# Patient Record
Sex: Female | Born: 1995 | Race: Black or African American | Hispanic: No | Marital: Single | Smoking: Current some day smoker
Health system: Southern US, Community
[De-identification: ages and names within clinical notes are randomized; demographics above are authoritative.]

## PROBLEM LIST (undated history)

## (undated) ENCOUNTER — Emergency Department (HOSPITAL_COMMUNITY): Admission: EM | Payer: Self-pay

## (undated) DIAGNOSIS — IMO0001 Reserved for inherently not codable concepts without codable children: Secondary | ICD-10-CM

## (undated) DIAGNOSIS — E282 Polycystic ovarian syndrome: Secondary | ICD-10-CM

## (undated) DIAGNOSIS — J45909 Unspecified asthma, uncomplicated: Secondary | ICD-10-CM

## (undated) DIAGNOSIS — H35039 Hypertensive retinopathy, unspecified eye: Secondary | ICD-10-CM

## (undated) DIAGNOSIS — E119 Type 2 diabetes mellitus without complications: Secondary | ICD-10-CM

## (undated) DIAGNOSIS — E669 Obesity, unspecified: Secondary | ICD-10-CM

## (undated) DIAGNOSIS — Z794 Long term (current) use of insulin: Secondary | ICD-10-CM

## (undated) DIAGNOSIS — F909 Attention-deficit hyperactivity disorder, unspecified type: Secondary | ICD-10-CM

## (undated) DIAGNOSIS — N9489 Other specified conditions associated with female genital organs and menstrual cycle: Secondary | ICD-10-CM

## (undated) DIAGNOSIS — Z8709 Personal history of other diseases of the respiratory system: Secondary | ICD-10-CM

## (undated) DIAGNOSIS — E11319 Type 2 diabetes mellitus with unspecified diabetic retinopathy without macular edema: Secondary | ICD-10-CM

## (undated) DIAGNOSIS — R030 Elevated blood-pressure reading, without diagnosis of hypertension: Secondary | ICD-10-CM

## (undated) DIAGNOSIS — H3500 Unspecified background retinopathy: Secondary | ICD-10-CM

## (undated) HISTORY — PX: EXTERNAL EAR SURGERY: SHX627

## (undated) HISTORY — DX: Attention-deficit hyperactivity disorder, unspecified type: F90.9

## (undated) HISTORY — PX: TYMPANOSTOMY TUBE PLACEMENT: SHX32

## (undated) HISTORY — DX: Hypertensive retinopathy, unspecified eye: H35.039

## (undated) HISTORY — DX: Elevated blood-pressure reading, without diagnosis of hypertension: R03.0

## (undated) HISTORY — DX: Obesity, unspecified: E66.9

## (undated) HISTORY — DX: Type 2 diabetes mellitus with unspecified diabetic retinopathy without macular edema: E11.319

## (undated) HISTORY — DX: Polycystic ovarian syndrome: E28.2

## (undated) HISTORY — DX: Reserved for inherently not codable concepts without codable children: IMO0001

---
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
## 2013-04-07 ENCOUNTER — Other Ambulatory Visit (HOSPITAL_COMMUNITY)
Admission: RE | Admit: 2013-04-07 | Discharge: 2013-04-07 | Disposition: A | Discharge status: Home / Self Care | Payer: Medicaid Other | Source: Ambulatory Visit | Attending: Pediatrics | Admitting: Pediatrics

## 2013-04-07 ENCOUNTER — Ambulatory Visit (INDEPENDENT_AMBULATORY_CARE_PROVIDER_SITE_OTHER): Payer: Medicaid Other | Admitting: Pediatrics

## 2013-04-07 ENCOUNTER — Encounter: Payer: Self-pay | Admitting: Pediatrics

## 2013-04-07 VITALS — BP 138/84 | Ht 64.41 in | Wt 226.8 lb

## 2013-04-07 DIAGNOSIS — Z113 Encounter for screening for infections with a predominantly sexual mode of transmission: Secondary | ICD-10-CM | POA: Insufficient documentation

## 2013-04-07 DIAGNOSIS — N926 Irregular menstruation, unspecified: Secondary | ICD-10-CM

## 2013-04-07 DIAGNOSIS — M7918 Myalgia, other site: Secondary | ICD-10-CM

## 2013-04-07 DIAGNOSIS — K2970 Gastritis, unspecified, without bleeding: Secondary | ICD-10-CM

## 2013-04-07 DIAGNOSIS — Z23 Encounter for immunization: Secondary | ICD-10-CM

## 2013-04-07 DIAGNOSIS — E119 Type 2 diabetes mellitus without complications: Secondary | ICD-10-CM

## 2013-04-07 DIAGNOSIS — E1159 Type 2 diabetes mellitus with other circulatory complications: Secondary | ICD-10-CM

## 2013-04-07 DIAGNOSIS — IMO0001 Reserved for inherently not codable concepts without codable children: Secondary | ICD-10-CM

## 2013-04-07 LAB — POCT URINALYSIS DIPSTICK
Blood, UA: NEGATIVE
Nitrite, UA: NEGATIVE
Protein, UA: NEGATIVE
Spec Grav, UA: 1.01
Urobilinogen, UA: NEGATIVE

## 2013-04-07 MED ORDER — FREESTYLE SYSTEM KIT: 1.0000 | PACK | Status: DC | PRN

## 2013-04-07 MED ORDER — MEFENAMIC ACID 250 MG PO CAPS: 2.0000 | ORAL_CAPSULE | Freq: Three times a day (TID) | ORAL | Status: AC

## 2013-04-07 MED ORDER — NAPROXEN SODIUM 220 MG PO TABS: 220.0000 mg | ORAL_TABLET | Freq: Two times a day (BID) | ORAL | Status: DC

## 2013-04-07 MED ORDER — METFORMIN HCL ER 500 MG PO TB24: 1500.0000 mg | ORAL_TABLET | Freq: Every evening | ORAL | Status: DC | PRN

## 2013-04-07 MED ORDER — OMEPRAZOLE 20 MG PO CPDR: 20.0000 mg | DELAYED_RELEASE_CAPSULE | Freq: Every day | ORAL | Status: DC

## 2013-04-07 NOTE — Progress Notes (Signed)
History was provided by the patient and mother.  Mercedes Valdez is a 17 y.o. female who is here for irregular bleeding after nexplanon & elevated blood glucose.Marland Kitchen     HPI:  Mercedes Valdez is here due to concerns of frequent irregular spotting & bleeding after Nexplanon placement on 11/09/12. She reports that initially she did well for a few weeks & then started having irregular spotting. Initially it was atleast 2-3 times a week & now it is a day of spotting or bleeding needed 1-3 pad changes every week. She is very frustrated with this bleeding pattern & is wondering if she should get the Nexplanon taken out. She reports that occasional during the bleeding, she experiences cramping pain. She is sexually active & reports to be using condoms. No dysuria.  Pt also has not been compliant with her metformin which has been a chronic issue. At her visit with Dr Marina Goodell, referral was made to Endocrine but Dilan missed the appt & reports that they did not know about the appt. Her bld glucose was elevated at her last visit to 334 mg/dl & is 161 mg/dl today. She is asymptomatic today & had just completed a meal of double cheese burger & a large sweet tea. Lynnex c/o gastitis & nausea with metformin & is very non-compliant with meds. She does not follow any dietary advise & does not seem concerned about the consequences. Mom was with her today & was reporting that Mercedes Valdez usually does not eat unhealthy but due to financial constraints they are unable to make the best choices.  Mom also has Type 2 DM & is on meds. They do not have a glucometer to check BG at home. Overall [name] reports that she feels healthy & is not had any infections or illness in the past 6 mths. 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 She has a h/o meniscal tear & has had surgical repair. She does not get adequate breaks at work to eat small frequent meals. She c/o occasional  headaches. No blurry vision, no h/o dizziness or fainting spells.   Physical Exam:  BP 138/84  Ht 5' 4.41" (1.636 m)  Wt 226 lb 12.8 oz (102.876 kg)  BMI 38.44 kg/m2  LMP 04/03/2013  99.7% systolic and 96.9% diastolic of BP percentile by age, sex, and height. Patient's last menstrual period was 04/03/2013.    General:   alert and cooperative     Skin:   normal, multiple body piercings  Oral cavity:   lips, mucosa, and tongue normal; teeth and gums normal  Eyes:   sclerae white, pupils equal and reactive  Ears:   normal bilaterally  Nose: clear, no discharge  Neck:  Neck appearance: Normal, AN present.  Lungs:  clear to auscultation bilaterally  Heart:   regular rate and rhythm, S1, S2 normal, no murmur, click, rub or gallop   Abdomen:  soft, non-tender; bowel sounds normal; no masses,  no organomegaly  GU:  no external lesions or discharge. Pelvic not performed.  Extremities:   extremities normal, atraumatic, no cyanosis or edema  Neuro:  normal without focal findings    Assessment/Plan:  1. Irregular menses - Urine cytology- GC/Chlam requested - POCT urine pregnancy- negative. Literature review denoted that there may be improvement of irregular bleeding with NSAID use for 5-7 days. Will give a trial. - Mefenamic Acid 250 MG CAPS; Take 2 capsules (500 mg total)  by mouth 3 (three) times daily.  Dispense: 28 each; Refill: 0  2. Diabetes Detailed discussion regarding diet/lifestyle modification & medication compliance. Advised to restart metforin 1500 mg at bedtime daily. Will start prilosec 20 mg daily for gastritis. Fasting labs requested: - CBC with Differential - Hemoglobin A1c - Lipid panel - Comprehensive metabolic panel - C-peptide - For home use only DME Glucometer - glucose monitoring kit (FREESTYLE) monitoring kit; 1 each by Does not apply route as needed for other.  Dispense: 1 each.  Referral made again to [facility] endocrinology & an appt was obtained by [facility]  when patient was in clinic. Importance of compliance for f/u with endocrinology for further DM management stressed. Pt may need switch to insulin due to persistent elevated BG & poor compliance with metformin.  - Flu Vaccine QUAD with presevative (Flulaval Quad)    6. Musculoskeletal pain Encouraged weight control & exercise. - naproxen sodium (ANAPROX) 220 MG tablet; Take 1 tablet (220 mg total) by mouth 2 (two) times daily with a meal prn use. START AFTER COMPLETION OF MEFENAMIC ACID COURSE  Dispense: 60 tablet; Refill: 1   - Follow-up visit in 1 month for f/u with Dr Marina Goodell regarding irregular bleeding secondary to Nexplanon, or sooner as needed.   Visit lasted for 45 min of which > 50% time was spent in counseling.   [name], MD  04/07/2013

## 2013-04-08 DIAGNOSIS — E119 Type 2 diabetes mellitus without complications: Secondary | ICD-10-CM | POA: Insufficient documentation

## 2013-04-08 DIAGNOSIS — M7918 Myalgia, other site: Secondary | ICD-10-CM | POA: Insufficient documentation

## 2013-04-08 DIAGNOSIS — K2970 Gastritis, unspecified, without bleeding: Secondary | ICD-10-CM | POA: Insufficient documentation

## 2013-04-08 DIAGNOSIS — N926 Irregular menstruation, unspecified: Secondary | ICD-10-CM | POA: Insufficient documentation

## 2013-04-11 LAB — COMPREHENSIVE METABOLIC PANEL
ALT: 9 U/L (ref 0–35)
AST: 11 U/L (ref 0–37)
Albumin: 4.6 g/dL (ref 3.5–5.2)
CO2: 23 mEq/L (ref 19–32)
Calcium: 9.7 mg/dL (ref 8.4–10.5)
Chloride: 97 mEq/L (ref 96–112)
Creat: 0.63 mg/dL (ref 0.50–1.10)
Potassium: 4.5 mEq/L (ref 3.5–5.3)

## 2013-04-11 LAB — LIPID PANEL
Cholesterol: 242 mg/dL — ABNORMAL HIGH (ref 0–200)
LDL Cholesterol: 158 mg/dL — ABNORMAL HIGH (ref 0–99)
Triglycerides: 166 mg/dL — ABNORMAL HIGH (ref <150)
VLDL: 33 mg/dL (ref 0–40)

## 2013-04-11 LAB — CBC WITH DIFFERENTIAL/PLATELET
Basophils Absolute: 0 10*3/uL (ref 0.0–0.1)
Basophils Relative: 0 % (ref 0–1)
Eosinophils Absolute: 0.2 10*3/uL (ref 0.0–0.7)
Eosinophils Relative: 2 % (ref 0–5)
Lymphocytes Relative: 48 % — ABNORMAL HIGH (ref 12–46)
Lymphs Abs: 3.7 10*3/uL (ref 0.7–4.0)
MCH: 26.1 pg (ref 26.0–34.0)
Neutrophils Relative %: 46 % (ref 43–77)
Platelets: 505 10*3/uL — ABNORMAL HIGH (ref 150–400)
RBC: 5.06 MIL/uL (ref 3.87–5.11)
RDW: 15 % (ref 11.5–15.5)
WBC: 7.8 10*3/uL (ref 4.0–10.5)

## 2013-04-11 LAB — HEMOGLOBIN A1C
Hgb A1c MFr Bld: 14.6 % — ABNORMAL HIGH (ref <5.7)
Mean Plasma Glucose: 372 mg/dL — ABNORMAL HIGH (ref <117)

## 2013-04-11 LAB — C-PEPTIDE: C-Peptide: 2.94 ng/mL (ref 0.80–3.90)

## 2013-04-12 ENCOUNTER — Ambulatory Visit (INDEPENDENT_AMBULATORY_CARE_PROVIDER_SITE_OTHER): Payer: Medicaid Other | Admitting: Internal Medicine

## 2013-04-12 ENCOUNTER — Other Ambulatory Visit: Payer: Self-pay | Admitting: *Deleted

## 2013-04-12 ENCOUNTER — Encounter: Payer: Self-pay | Admitting: Internal Medicine

## 2013-04-12 VITALS — BP 126/84 | HR 94 | Temp 97.7°F | Resp 12 | Ht 65.0 in | Wt 225.7 lb

## 2013-04-12 DIAGNOSIS — E1159 Type 2 diabetes mellitus with other circulatory complications: Secondary | ICD-10-CM

## 2013-04-12 MED ORDER — GLUCOSE BLOOD VI STRP: ORAL_STRIP | Status: DC

## 2013-04-12 MED ORDER — INSULIN GLARGINE 100 UNIT/ML SOLOSTAR PEN: PEN_INJECTOR | SUBCUTANEOUS | Status: DC

## 2013-04-12 MED ORDER — METFORMIN HCL ER 500 MG PO TB24: 1000.0000 mg | ORAL_TABLET | Freq: Two times a day (BID) | ORAL | Status: DC

## 2013-04-12 MED ORDER — ACCU-CHEK SOFTCLIX LANCET DEV KIT: 1.0000 | PACK | Freq: Every day | Status: DC

## 2013-04-12 MED ORDER — ACCU-CHEK SOFTCLIX LANCETS MISC: Status: DC

## 2013-04-12 MED ORDER — GLIPIZIDE 10 MG PO TABS: 10.0000 mg | ORAL_TABLET | Freq: Two times a day (BID) | ORAL | Status: DC

## 2013-04-12 MED ORDER — INSULIN PEN NEEDLE 31G X 5 MM MISC: Status: DC

## 2013-04-12 NOTE — Progress Notes (Signed)
Patient ID: Mercedes Valdez, female   DOB: 1995-06-28, 17 y.o.   MRN: [id number]  HPI: Mercedes Valdez is a 17 y.o.-year-old female, referred by her PCP, Dr. Wynetta Emery, for management of DM2, non-insulin-dependent, uncontrolled, without complications.  Patient has been diagnosed with diabetes in 2011; she has not been on insulin before.  Last hemoglobin A1c was: Lab Results  Component Value Date   HGBA1C 14.6* 04/11/2013   Pt is on a regimen of: - Metformin XR - but not taking it because it makes her sick.   Pt started to check sugars 5 days ago when she got a new meter (OneTouch) -  checks her sugars once a day and they are: - am: 300s - 2h after b'fast: n/c - before lunch: n/c - 2h after lunch: n/c - before dinner: n/c - 2h after dinner: n/c - bedtime: n/c  No lows; ? if hypoglycemia awareness Highest sugar was 424. Sh was not hospitalized for diabetes.   Pt's meals are: - Breakfast: oatmeal, cereal, toast, eggs - Lunch: soups, sandwich - Dinner: meat + veggies - Snacks: apples, craisins, chips  - no CKD, last BUN/creatinine:  Lab Results  Component Value Date   BUN 8 04/11/2013   CREATININE 0.63 04/11/2013   - last set of lipids: Lab Results  Component Value Date   CHOL 242* 04/11/2013   HDL 51 04/11/2013   LDLCALC 158* 04/11/2013   TRIG 166* 04/11/2013   CHOLHDL 4.7 04/11/2013   - last eye exam was in 03/30/2013. No DR.  - no numbness and tingling in her feet.  I reviewed her chart and she also has a history of PCOS, asthma.   Pt has FH of DM in mother, uncle, aunt.   ROS: Constitutional: + weight gain and loss, + fatigue, + both subjective hyperthermia/hypothermia, + poor sleep, + nocturia Eyes: + blurry vision, no xerophthalmia ENT: no sore throat, no nodules palpated in throat, no dysphagia/odynophagia, no hoarseness, + decrease hearing, + tinnitus Cardiovascular: +CP/+SOB/no palpitations/leg swelling Respiratory: no cough/+SOB Gastrointestinal: no  N/V/D/C Musculoskeletal: + both muscle/joint aches Skin: no rashes, + itching, + boil L axilla Neurological: no tremors/numbness/tingling/dizziness, + HA Psychiatric: no depression/anxiety Irreg menstrual cycles  Past Medical History  Diagnosis Date  . ADHD (attention deficit hyperactivity disorder)   . Diabetes mellitus     Type 2  . Obesity   . PCOS (polycystic ovarian syndrome)   . Elevated blood pressure   . Asthma    Past Surgical History  Procedure Laterality Date  . External ear surgery      ear tubes   History   Social History  . Marital Status: Single    Spouse Name: N/A    Number of Children: 0  . Years of Education: 11   Occupational History  . Student    Social History Main Topics  . Smoking status: Current Every Day Smoker    Types: Cigarettes  . Smokeless tobacco: Not on file  . Alcohol Use: No  . Drug Use: No   Current Outpatient Prescriptions on File Prior to Visit  Medication Sig Dispense Refill  . glucose monitoring kit (FREESTYLE) monitoring kit 1 each by Does not apply route as needed for other.  1 each  2  . metFORMIN (GLUCOPHAGE XR) 500 MG 24 hr tablet Take 3 tablets (1,500 mg total) by mouth at bedtime and may repeat dose one time if needed.  90 tablet  1  . omeprazole (PRILOSEC) 20 MG capsule Take  1 capsule (20 mg total) by mouth daily.  30 capsule  3  . Mefenamic Acid 250 MG CAPS Take 2 capsules (500 mg total) by mouth 3 (three) times daily.  28 each  0  . naproxen sodium (ANAPROX) 220 MG tablet Take 1 tablet (220 mg total) by mouth 2 (two) times daily with a meal. START AFTER COMPLETION OF MEFENAMIC ACID COURSE  60 tablet  1   Current Facility-Administered Medications on File Prior to Visit  Medication Dose Route Frequency Provider Last Rate Last Dose  . lidocaine (XYLOCAINE) 1 % (with pres) injection 2 mL  2 mL Intradermal Once Cain Sieve, MD       Allergies  Allergen Reactions  . Amoxicillin Hives  . Amoxicillin-Pot  Clavulanate   . Cephalosporins Hives  . Eryzole   . ZOX:[id number]   . Penicillins    Family History  Problem Relation Age of Onset  . Diabetes Mother   . Vision loss Mother   . ADD / ADHD Brother   . Allergies Brother   . Asthma Brother   . Vision loss Brother   . Cancer Paternal Grandfather     Colon Cancer   PE: LMP 04/03/2013 Wt Readings from Last 3 Encounters:  04/07/13 226 lb 12.8 oz (102.876 kg) (99%*, Z = 2.28)  11/09/12 226 lb (102.513 kg) (99%*, Z = 2.27)  10/05/12 232 lb 2.3 oz (105.3 kg) (99%*, Z = 2.33)   * Growth percentiles are based on CDC 2-20 Years data.   Constitutional: obese, in NAD Eyes: PERRLA, EOMI, no exophthalmos ENT: moist mucous membranes, no thyromegaly, no cervical lymphadenopathy Cardiovascular: RRR, No MRG Respiratory: CTA B Gastrointestinal: abdomen soft, NT, ND, BS+ Musculoskeletal: no deformities, strength intact in all 4 Skin: moist, warm, no rashes, + acanthosis nigricans neck; boil with 1.5 cm induration (not expressing pus) in left axilla Neurological: no tremor with outstretched hands, DTR normal in all 4  ASSESSMENT: 1. DM2, insulin-dependent, uncontrolled, without complications  2. Left axillar boil  PLAN:  1. Patient with long-standing, recently more uncontrolled diabetes, on oral antidiabetic regimen, which became insufficient - We discussed about options for treatment, and I suggested to:  Please start Lantus at 25 units under skin at night, before bedtime. Start Glipizide 10 mg 2x a day before breakfast and dinner.  Try Glumetza 500 mg at dinner for 5 days, then add another 500 mg with breakfast for 5 days, then add 500 mg at dinner for 5 days, then add 500 mg in am for a total dose of 1000 mg 2x a day. Please call me for refills if you can tolerate it. Check sugars at least 2x a day and write them down. Please return in 1 month with your sugar log.  You will be called with the nutrition appointment. - demonstrated pen use,  explained correct injection technique: - discussed proper injection techniques:  Preference for the abdominal sq tissue  Rotation of sites  Change needle for each injection  Keep needle in for 10 sec after last unit of insulin in - Strongly advised her to start checking sugars at different times of the day - check 2 times a day, rotating checks - given sugar log and advised how to fill it and to bring it at next appt  - given foot care handout and explained the principles  - given instructions for hypoglycemia management "15-15 rule"  - up to date with eye exams - sent refills for Lantus, pen needles, glipizide,  test strips and lancets to her pharmacy - Return to clinic in 1 mo with sugar log   2. Boil - advised pt to see PCP/UC/ED to have the abscess lanced, but she tells me she will apply heat to it to see if drains by itself first

## 2013-04-12 NOTE — Patient Instructions (Signed)
Please start Lantus at 25 units under skin at night, before bedtime. Start Glipizide 10 mg 2x a day before breakfast and dinner.  Try Glumetza 500 mg at dinner for 5 days, then add another 500 mg with breakfast for 5 days, then add 500 mg at dinner for 5 days, then add 500 mg in am for a total dose of 1000 mg 2x a day. Please call me for refills if you can tolerate it. Check sugars at least 2x a day and write them down. Please return in 1 month with your sugar log.  You will be called with the nutrition appointment.

## 2013-04-16 NOTE — Progress Notes (Signed)
Tried to contact to schedule f/u visit w/ Dr. Marina Goodell but n/a so LVM for parent to call office and schedule appt.

## 2013-05-12 ENCOUNTER — Encounter: Payer: Self-pay | Admitting: Internal Medicine

## 2013-05-12 ENCOUNTER — Ambulatory Visit (INDEPENDENT_AMBULATORY_CARE_PROVIDER_SITE_OTHER): Payer: Medicaid Other | Admitting: Internal Medicine

## 2013-05-12 VITALS — BP 114/74 | HR 107 | Temp 98.3°F | Resp 12 | Wt 233.8 lb

## 2013-05-12 DIAGNOSIS — E1159 Type 2 diabetes mellitus with other circulatory complications: Secondary | ICD-10-CM

## 2013-05-12 MED ORDER — GLUMETZA 500 MG PO TB24: 500.0000 mg | ORAL_TABLET | Freq: Every day | ORAL | Status: DC

## 2013-05-12 NOTE — Patient Instructions (Signed)
Please return in 1 month with your sugar log. Please start Metformin XR 500 mg with dinner x 4 days. If you tolerate this well, add another Metformin tablet (500 mg) with breakfast x 4 days. If you tolerate this well, add another metformin tablet with dinner (total 1000 mg) x 4 days. If you tolerate this well, add another metformin tablets with breakfast (total 1000 mg). Continue with 1000 mg of metformin twice a day with breakfast and dinner. Continue Lantus at 25 units daily.

## 2013-05-12 NOTE — Progress Notes (Signed)
Patient ID: Mercedes Valdez, female   DOB: 11-29-1995, 17 y.o.   MRN: [id number]  HPI: Mercedes Valdez is a 17 y.o.-year-old female, returning for f/u for DM2, dx 2011, insulin-dependent, uncontrolled, without complications.  Last hemoglobin A1c was: Lab Results  Component Value Date   HGBA1C 14.6* 04/11/2013   Pt was on a regimen of: - Metformin XR - but not taking it because it makes her sick.   At last visit, we started: Lantus at 25 units at bedtime. Glipizide 10 mg 2x a day before breakfast and dinner.  Advised her to try Glumetza >> samples >> she is on 500 mg daily (did not increase to 4 tabs a day as advised, but she tolerates this well)  Pt started to check sugars - new meter (OneTouch) -  checks her sugars once a day and they are: - am: 300s >> 100s - 2h after b'fast: n/c - before lunch: n/c - 2h after lunch: n/c - before dinner: n/c - 2h after dinner: n/c - bedtime: n/c >> 100s-high 200s  No lows; ? if hypoglycemia awareness Highest sugar was 200s. .   Pt's meals are: - Breakfast: oatmeal, cereal, toast, eggs - Lunch: soups, sandwich - Dinner: meat + veggies - Snacks: apples, craisins, chips  - no CKD, last BUN/creatinine:  Lab Results  Component Value Date   BUN 8 04/11/2013   CREATININE 0.63 04/11/2013   - last set of lipids: Lab Results  Component Value Date   CHOL 242* 04/11/2013   HDL 51 04/11/2013   LDLCALC 158* 04/11/2013   TRIG 166* 04/11/2013   CHOLHDL 4.7 04/11/2013   - last eye exam was in 03/30/2013. No DR.  - no numbness and tingling in her feet.  I reviewed her chart and she also has a history of PCOS, asthma.   I reviewed pt's medications, allergies, PMH, social hx, family hx and no changes required, except as mentioned above.  ROS: Constitutional: no weight gain/loss, + fatigue, + both subjective hyperthermia/hypothermia, + poor sleep, + nocturia Eyes: + blurry vision in am  - sleeps on abdomen, no xerophthalmia ENT: no sore  throat, no nodules palpated in throat, no dysphagia/odynophagia, no hoarseness, + decrease hearing, + tinnitus Cardiovascular: +CP/no SOB/no palpitations/leg swelling Respiratory: no cough/SOB Gastrointestinal: no N/V/D/C Musculoskeletal: no muscle/+ joint aches Skin: no rashes Neurological: no tremors/numbness/tingling/dizziness, + HA  PE: BP 114/74  Pulse 107  Temp(Src) 98.3 F (36.8 C) (Oral)  Resp 12  Wt 233 lb 12.8 oz (106.051 kg)  SpO2 98% Wt Readings from Last 3 Encounters:  05/12/13 233 lb 12.8 oz (106.051 kg) (99%*, Z = 2.36)  04/12/13 225 lb 11.2 oz (102.377 kg) (99%*, Z = 2.27)  04/07/13 226 lb 12.8 oz (102.876 kg) (99%*, Z = 2.28)   * Growth percentiles are based on CDC 2-20 Years data.   Constitutional: obese, in NAD Eyes: PERRLA, EOMI, no exophthalmos ENT: moist mucous membranes, no thyromegaly, no cervical lymphadenopathy Cardiovascular: RRR, No MRG Respiratory: CTA B Gastrointestinal: abdomen soft, NT, ND, BS+ Musculoskeletal: no deformities, strength intact in all 4 Skin: moist, warm, no rashes, + acanthosis nigricans neck;  ASSESSMENT: 1. DM2, insulin-dependent, uncontrolled, without complications  2. Left axillar boil - resolved w/o intervention  PLAN:  1. Patient with long-standing, recently more uncontrolled diabetes, with better control after restarting insulin. No log today, but sugars apparently improved.  - We discussed about options for treatment, and I suggested to:  Patient Instructions  Please return in 1  month with your sugar log. Please start Metformin XR 500 mg with dinner x 4 days. If you tolerate this well, add another Metformin tablet (500 mg) with breakfast x 4 days. If you tolerate this well, add another metformin tablet with dinner (total 1000 mg) x 4 days. If you tolerate this well, add another metformin tablets with breakfast (total 1000 mg). Continue with 1000 mg of metformin twice a day with breakfast and dinner. Continue Lantus at  25 units daily.  - given Glumetza sample - awaiting nutrition appt - Strongly advised her to start checking sugars at different times of the day - check 2 times a day, rotating checks - given new sugar log and advised how to fill it and to bring it at next appt  - up to date with eye exams - Return to clinic in 1 mo with sugar log   2. Boil - resolved

## 2013-05-17 ENCOUNTER — Ambulatory Visit (INDEPENDENT_AMBULATORY_CARE_PROVIDER_SITE_OTHER): Payer: Medicaid Other | Admitting: Pediatrics

## 2013-05-17 ENCOUNTER — Encounter: Payer: Self-pay | Admitting: Pediatrics

## 2013-05-17 VITALS — BP 130/80 | Ht 64.53 in | Wt 233.9 lb

## 2013-05-17 DIAGNOSIS — Z309 Encounter for contraceptive management, unspecified: Secondary | ICD-10-CM

## 2013-05-17 DIAGNOSIS — E1159 Type 2 diabetes mellitus with other circulatory complications: Secondary | ICD-10-CM

## 2013-05-17 DIAGNOSIS — Z3046 Encounter for surveillance of implantable subdermal contraceptive: Secondary | ICD-10-CM

## 2013-05-17 DIAGNOSIS — R81 Glycosuria: Secondary | ICD-10-CM

## 2013-05-17 LAB — POCT URINALYSIS DIPSTICK
Blood, UA: NEGATIVE
Ketones, UA: NEGATIVE
Protein, UA: NEGATIVE
Spec Grav, UA: 1.02
Urobilinogen, UA: NEGATIVE

## 2013-05-17 LAB — GLUCOSE, POCT (MANUAL RESULT ENTRY): POC Glucose: 276 mg/dl — AB (ref 70–99)

## 2013-05-17 MED ORDER — LEVONORGESTREL 1.5 MG PO TABS: 1.5000 mg | ORAL_TABLET | Freq: Once | ORAL | Status: DC

## 2013-05-17 NOTE — Patient Instructions (Addendum)
Keep your dressing on for 24 hours. After that, you may remove it and wash the area normally with soap and water. If you experience any of the following, come back to clinic to be checked out immediately:  Severe pain in the area  Increased redness, swelling, pain, bleeding, or drainage from the wound  Failure of the wound to heal  Systemic symptoms like fever/chills, nausea, or vomiting  With your Nexplanon removed, you can become pregnant immediately. If you become sexually active again, you should always use condoms. No form of birth control other than condoms protects against sexually transmitted infection, regardless. If you think you have become pregnant or have an infection, come back to be checked out. Come back to see Dr. Marina Goodell in 4 weeks.  Levonorgestrel emergency contraceptive kit What is this medicine? LEVONORGESTREL (LEE voe nor jes trel) is an emergency contraceptive (birth control pill). It prevents pregnancy if taken within the 72 hours after unprotected sex. This medicine will not work if you are already pregnant. This medicine may be used for other purposes; ask your health care provider or pharmacist if you have questions. COMMON BRAND NAME(S): My Way, Next Choice One Dose, Next Choice, Plan B One-Step , Plan B What should I tell my health care provider before I take this medicine? They need to know if you have or ever had any of these conditions: -blood sugar problems, like diabetes -cancer of the breast, cervix, ovary, uterus, vagina, or unusual vaginal bleeding -fibroids -liver disease -menstrual problems -migraine headaches -an unusual or allergic reaction to levonorgestrel, other medicines, foods, dyes, or preservatives -pregnant or trying to get pregnant -breast-feeding How should I use this medicine? Take this medicine by mouth. Your doctor may want you to use a quick-response pregnancy test prior to using the tablets. Take your medicine as soon as you can  after having unprotected sex, preferably in the first 24 hours, but no later than 72 hours (3 days) after the event. Follow the dose instructions of your health care provider exactly. Do not take any extra pills. Extra pills will not decrease your risk of pregnancy, but may increase your risk of side effects. A patient package insert for the product will be given with each prescription and refill. Read this sheet carefully each time. The sheet may change frequently. Contact your pediatrician regarding the use of this medicine in children. Special care may be needed. This medicine has been used in female children who have started having menstrual periods. Overdosage: If you think you have taken too much of this medicine contact a poison control center or emergency room at once. NOTE: This medicine is only for you. Do not share this medicine with others. What if I miss a dose? If you miss a dose or vomit within 1 hour of taking your dose, you MUST contact your health care professional for instructions. What may interact with this medicine? Do not take this medicine with any of the following medications: -amprenavir -bosentan -fosamprenavir This medicine may also interact with the following medications: -aprepitant -barbiturate medicines for inducing sleep or treating seizures -bexarotene -griseofulvin -medicines to treat seizures like carbamazepine, ethotoin, felbamate, oxcarbazepine, phenytoin, topiramate -modafinil -pioglitazone -rifabutin -rifampin -rifapentine -some medicines to treat HIV infection like atazanavir, indinavir, lopinavir, nelfinavir, tipranavir, ritonavir -St. John's wort -warfarin This list may not describe all possible interactions. Give your health care provider a list of all the medicines, herbs, non-prescription drugs, or dietary supplements you use. Also tell them if you smoke, drink  alcohol, or use illegal drugs. Some items may interact with your medicine. What  should I watch for while using this medicine? Emergency birth control is not to be used routinely to prevent pregnancy. Discuss birth control options with your health care provider. Make a follow-up appointment to see your health care provider in 3 to 4 weeks after using this medicine. It is common to have spotting after using this medicine. If you miss your next period, the possibility of pregnancy must be considered. See your health care professional as soon as you can and get a pregnancy test. Smoking increases the risk of getting a blood clot or having a stroke while you are taking birth control pills, especially if you are more than 19 years old. You are strongly advised not to smoke. This medicine does not protect you against HIV infection (AIDS) or any other sexually transmitted diseases. What side effects may I notice from receiving this medicine? Side effects that you should report to your doctor or health care professional as soon as possible: -Severe side effects are not common. However, the potential for severe side effects may exist and you may want to discuss these with your health care provider. Side effects that usually do not require medical attention (report to your doctor or health care professional if they continue or are bothersome): -abdominal pain or cramping -breast tenderness -dizziness -nausea -spotting This list may not describe all possible side effects. Call your doctor for medical advice about side effects. You may report side effects to FDA at 1-800-FDA-1088. Where should I keep my medicine? Keep out of the reach of children. Store at room temperature between 15 and 30 degrees C (59 and 86 degrees F). Throw away any unused medicine after the expiration date. NOTE: This sheet is a summary. It may not cover all possible information. If you have questions about this medicine, talk to your doctor, pharmacist, or health care provider.  2014, [employer]/Gold Standard.  (2008-04-27 13:51:24)

## 2013-05-17 NOTE — Progress Notes (Signed)
Adolescent Medicine Consultation Follow-Up Visit Mercedes Valdez  is a 17 y.o. female referred by Dr. Wynetta Emery (PCP) here today for management of contraception - pt desires Nexplanon removal. Also briefly wishes to discuss diabetes.   PCP Confirmed?  yes  [name], MD   History was provided by the patient.  Chart review:  Last seen by Dr. Marina Goodell on 11/09/12.  Treatment plan at last visit was for Nexplanon inserted that visit and to f/u with PCP for consideration to see endocrinologist (has been done).  HPI:  By problem:  Contraception management - pt had Nexplanon placed 6/17 and has had continued irregular / unpredictable vaginal bleeding ever since - pt reports no pain in the area around the implant, no bleeding or drainage from the insertion site - pt states she has bleeding "every other day," every week, with a few very brief periods (weeks or less) of no bleeding - pt has not been sexually active since insertion of Nexplanon, and denies vaginal discharge, pain, itching / burning, or systemic symptoms of infection - pt denies symptoms of anemia such as lightheadedness / dizziness, syncope or presyncope, palpitations, SOB, tachycardia - pt wishes to have Nexplanon removed and does not desire other forms of birth control (note she is a current smoker)  - pt has been on OCP's in the past; does not wish to take them again due to having to "keep up with them"  - has never had IUD but does not wish to get one due to pain of insertion, fear of falling out or feeling it inside her, etc  - does not wish to try Nuvaring due to discomfort with the idea of placing them inside her vagina  - pt is over 90 kg and is not a good candidate for patches  - pt has used Depo in the past but had bad issues with weight gain and does not with to resume shots  DM - pt states she is compliant with Glumetza, glipizide, and insulin (Lantus) - states she has seen endocrinologist recently and has upcoming  appointment in about 1 month - reports taking medications as she should but does not always take insulin appropriately (complains of pain with injection) - is vague about whether or not she is compliant with dietary changes - denies hypoglycemic episodes  Menstrual History: Patient's last menstrual period was 05/07/2013.  Issues with bleeding, as above.   ROS: As above. Generally feels well. Denies fever / chills, headache, N/V/D, abdominal or chest pain, SOB, swelling / pain in LE, cough, tachycardia or palpitations.  Problem List Reviewed:  yes Medication List Reviewed: yes  Social History: Confidentiality was discussed with the patient and if applicable, with caregiver as well. Tobacco?  Yes - cutting back actively, not ready to completely quit Sexually active? No, last intercourse months ago Last STI Screening: unsure Pregnancy Prevention: contraception counseling, as above  Physical Exam:  Filed Vitals:   05/17/13 0958  BP: 130/80  Height: 5' 4.53" (1.639 m)  Weight: 233 lb 14.5 oz (106.1 kg)   BP 130/80  Ht 5' 4.53" (1.639 m)  Wt 233 lb 14.5 oz (106.1 kg)  BMI 39.50 kg/m2  LMP 05/07/2013 Body mass index: body mass index is 39.5 kg/(m^2). 97.6% systolic and 93.6% diastolic of BP percentile by age, sex, and height. 127/81 is approximately the 95th BP percentile reading.  Gen: well-appearing young adult female in NAD HEENT: Tobias/AT, sclerae/conjunctivae clear, no lid lag, EOMI, PERRLA   MMM, posterior oropharynx clear,  no cervical lymphadenopathy Cardio: RRR, no murmur appreciated; distal pulses intact/symmetric Pulm: CTAB, no wheezes, normal WOB  Abd: obese; soft, nondistended, BS+, no HSM Ext: warm/well-perfused, no cyanosis/clubbing/edema MSK: strength 5/5 in all four extremities, no frank joint deformity/effusion  LUE - Nexplanon implant easily palpable just below skin in subcutaneous tissue  Implant approximately 6 cm proximal to medial epicondyle Neuro/Psych:  alert/oriented, sensation grossly intact; normal gait/balance  mood euthymic with congruent affect  Assessment/Plan: 1. 17yo female with Nexplanon in place, desires removal due to irregular / unpredictable bleeding - counseled extensively on various forms of birth control; pt declines specific contraceptive measure at this time - Nexplanon removed today (see also procedure note below); pt understands that she may become pregnant right away - counseled extensively on safe sex practices, condom use to prevent infection  - no STI screening today due to lack of intercourse in several months with no complaints and negative screening in the last year) - Rx provided for Plan B for emergency contraception and instructed on appropriate use - plan to f/u with Dr. Marina Goodell in 4 weeks  2. Additionally, DM briefly discussed. Continue current meds, strongly recommended keeping scheduled appt with endocrinologist in January  Medical decision-making:  - 25 minutes spent, more than 50% of appointment was spent discussing diagnosis and management of symptoms  NEXPLANON REMOVAL PROCEDURE NOTE: Patient given informed consent; risks and benefits of procedure explained. Pt provided verbal consent. Appropriate time out was taken. Pt identified by name, DOB, and MRN.  Pt was placed supine with left arm flexed and rotated at the elbow such that her wrist was laid parallel and level to her ear. The patient's nondominant (LEFT) arm was palpated and the implant device located.  The distal end of the device was palpated, and the skin was marked and cleaned with alcohol swabs. Approximately 3 mL of 1% lidocaine with epinephrine was injected into the skin around / deep to the implant device. The entire inner upper arm area was prepped with Betadine and draped using maximum sterile technique. A 0.5 mm stab incision was made with a sterile #11 scalpel near the end of the implanted device.  Fibrotic tissue around the device  was carefully dissected away using blunt and sharp dissection. The device was grasped with straight hemostats and removed in an intact manner.  Two butterfly closures and a sterile dressing were applied to the incision to reduce bruising. The patient tolerated the procedure well. There were no immediate complications. EBL was minimal.  The above was discussed in its entirety with attending physician Dr. Marina Goodell.   Bobbye Morton, MD  PGY-2, [facility] 05/17/2013, 10:33 AM

## 2013-05-25 NOTE — Progress Notes (Signed)
I reviewed with the resident the medical history and the resident's findings on physical examination.  I discussed with the resident the patient's diagnosis and concur with the treatment plan as documented in the resident's note.   

## 2013-06-13 ENCOUNTER — Ambulatory Visit: Payer: Medicaid Other | Admitting: Internal Medicine

## 2013-06-14 ENCOUNTER — Encounter: Payer: Self-pay | Admitting: Pediatrics

## 2013-06-14 ENCOUNTER — Ambulatory Visit (INDEPENDENT_AMBULATORY_CARE_PROVIDER_SITE_OTHER): Payer: Medicaid Other | Admitting: Pediatrics

## 2013-06-14 VITALS — BP 130/88 | HR 88 | Ht 64.5 in | Wt 238.0 lb

## 2013-06-14 DIAGNOSIS — R5383 Other fatigue: Secondary | ICD-10-CM

## 2013-06-14 DIAGNOSIS — R531 Weakness: Secondary | ICD-10-CM

## 2013-06-14 DIAGNOSIS — E1110 Type 2 diabetes mellitus with ketoacidosis without coma: Secondary | ICD-10-CM

## 2013-06-14 DIAGNOSIS — Z130 Encounter for screening for diseases of the blood and blood-forming organs and certain disorders involving the immune mechanism: Secondary | ICD-10-CM

## 2013-06-14 DIAGNOSIS — R81 Glycosuria: Secondary | ICD-10-CM

## 2013-06-14 DIAGNOSIS — R5381 Other malaise: Secondary | ICD-10-CM

## 2013-06-14 DIAGNOSIS — E1310 Other specified diabetes mellitus with ketoacidosis without coma: Secondary | ICD-10-CM

## 2013-06-14 DIAGNOSIS — Z3202 Encounter for pregnancy test, result negative: Secondary | ICD-10-CM

## 2013-06-14 DIAGNOSIS — Z309 Encounter for contraceptive management, unspecified: Secondary | ICD-10-CM

## 2013-06-14 LAB — POCT URINALYSIS DIPSTICK
Bilirubin, UA: NEGATIVE
Glucose, UA: 250
Ketones, UA: NEGATIVE
Leukocytes, UA: NEGATIVE
Nitrite, UA: NEGATIVE
PROTEIN UA: NEGATIVE
Spec Grav, UA: 1.015
UROBILINOGEN UA: NEGATIVE
pH, UA: 6

## 2013-06-14 LAB — POCT HEMOGLOBIN: HEMOGLOBIN: 12 g/dL — AB (ref 12.2–16.2)

## 2013-06-14 LAB — POCT URINE PREGNANCY: Preg Test, Ur: NEGATIVE

## 2013-06-14 LAB — GLUCOSE, POCT (MANUAL RESULT ENTRY): POC Glucose: 221 mg/dl — AB (ref 70–99)

## 2013-06-14 NOTE — Patient Instructions (Signed)
See the handouts we gave you on emergency contraception and the IUD.  Visit http://bedsider.org/ for information about various forms of birth control, including videos you can watch about how they work.  Come back in one month to follow up with Dr. Henrene Pastor.

## 2013-06-14 NOTE — Progress Notes (Signed)
Patient ID: Mercedes Valdez, female   DOB: Jun 08, 1995, 18 y.o.   MRN: [id number]  Adolescent Medicine Consultation Follow-Up Visit Mercedes Valdez  is a 18 y.o. female referred by Dr. Derrell Lolling here today for follow-up of contraception management.   PCP Confirmed?  yes  Loleta Chance, MD   History was provided by the patient.  Chart review:  Last seen by Dr. Henrene Pastor on 05/17/13. At that visit her nexplanon was removed. She declined any additional forms of birth control.   Patient's last menstrual period was 05/06/2013.  Last STI screen: November 2014 (negative gc/chlamydia)  HPI:    Contraception management: Arm has healed well since having nexplanon removed. She has not had sex since she had it removed. The spotting she had with the nexplanon has stopped. She has resumed her period and reports that it is heavier than normal now. She is not planning to have intercourse any time soon. Her mom has recommended to her that she try depo again but she is less interested in this because she gained weight on depo. She would perhaps be interested in an IUD.  Weakness: felt totally fine before coming to the clinic today but began to feel unwell in the waiting room. States she feels sleepy, weak, and dizzy. Also endorses some shortness of breath but denies chest discomfort. Does not feel nauseated. Has not had a fever.   ROS  Problem List Reviewed:  yes Medication List Reviewed:   yes  Social History: Confidentiality was discussed with the patient and if applicable, with caregiver as well.  Physical Exam:  Filed Vitals:   06/14/13 1019  BP: 130/88  Pulse: 88  Height: 5' 4.5" (1.638 m)  Weight: 238 lb (107.956 kg)   BP 130/88  Pulse 88  Ht 5' 4.5" (1.638 m)  Wt 238 lb (107.956 kg)  BMI 40.24 kg/m2  LMP 05/06/2013 Body mass index: body mass index is 40.24 kg/(m^2). 97.5% systolic and 88.3% diastolic of BP percentile by age, sex, and height. 126/81 is approximately the 95th BP  percentile reading. Gen: NAD, tearful at first, then brightens up in affect towards end of visit Heart: RRR, no murmurs auscultated Lungs: CTAB, NWOB Abd: soft, nontender to palpation Ext: nexplanon site well healed without erythema Neuro: face symmetric, PERRL, EOMI, strength 5/5 bilaterally in all four extremities  Assessment/Plan:  # Contraception management: pt declined having a depo shot today. Given information on IUD and other forms of birth control. She will follow up in 1 month to again discuss contraception.  # Weakness: etiology unclear but possibly related to anxiety attack/nervousness about coming into doctor's office (although pt denied being anxious). LCSW met with pt and did some anxiety reduction exercises with her. Urine pregnancy test negative. Fingerstick Hgb 12. CBG 221, so not hypoglycemic. UA unremarkable (large blood but currently menstruating). Pt felt back to normal prior to leaving office.  Medical decision-making:  - 25 minutes spent, more than 50% of appointment was spent discussing diagnosis and management of symptoms  Chrisandra Netters, MD [facility] PGY-2

## 2013-07-12 NOTE — Progress Notes (Signed)
I reviewed with the resident the medical history and the resident's findings on physical examination.  I discussed with the resident the patient's diagnosis and concur with the treatment plan as documented in the resident's note.

## 2013-07-15 ENCOUNTER — Ambulatory Visit: Payer: Medicaid Other | Admitting: Pediatrics

## 2013-11-16 ENCOUNTER — Ambulatory Visit (INDEPENDENT_AMBULATORY_CARE_PROVIDER_SITE_OTHER): Payer: Medicaid Other | Admitting: Pediatrics

## 2013-11-16 ENCOUNTER — Emergency Department (HOSPITAL_COMMUNITY): Payer: Medicaid Other

## 2013-11-16 ENCOUNTER — Encounter (HOSPITAL_COMMUNITY): Payer: Self-pay | Admitting: Emergency Medicine

## 2013-11-16 ENCOUNTER — Emergency Department (HOSPITAL_COMMUNITY)
Admission: EM | Admit: 2013-11-16 | Discharge: 2013-11-16 | Disposition: A | Discharge status: Home / Self Care | Payer: Medicaid Other | Attending: Emergency Medicine | Admitting: Emergency Medicine

## 2013-11-16 VITALS — HR 98 | Temp 98.4°F | Wt 238.8 lb

## 2013-11-16 DIAGNOSIS — E119 Type 2 diabetes mellitus without complications: Secondary | ICD-10-CM | POA: Insufficient documentation

## 2013-11-16 DIAGNOSIS — E669 Obesity, unspecified: Secondary | ICD-10-CM | POA: Insufficient documentation

## 2013-11-16 DIAGNOSIS — R0789 Other chest pain: Secondary | ICD-10-CM | POA: Insufficient documentation

## 2013-11-16 DIAGNOSIS — E282 Polycystic ovarian syndrome: Secondary | ICD-10-CM | POA: Insufficient documentation

## 2013-11-16 DIAGNOSIS — R0601 Orthopnea: Secondary | ICD-10-CM

## 2013-11-16 DIAGNOSIS — K219 Gastro-esophageal reflux disease without esophagitis: Secondary | ICD-10-CM

## 2013-11-16 DIAGNOSIS — F17200 Nicotine dependence, unspecified, uncomplicated: Secondary | ICD-10-CM | POA: Insufficient documentation

## 2013-11-16 DIAGNOSIS — R0602 Shortness of breath: Secondary | ICD-10-CM

## 2013-11-16 DIAGNOSIS — Z880 Allergy status to penicillin: Secondary | ICD-10-CM | POA: Insufficient documentation

## 2013-11-16 DIAGNOSIS — R000 Tachycardia, unspecified: Secondary | ICD-10-CM | POA: Insufficient documentation

## 2013-11-16 DIAGNOSIS — R079 Chest pain, unspecified: Secondary | ICD-10-CM

## 2013-11-16 DIAGNOSIS — J45909 Unspecified asthma, uncomplicated: Secondary | ICD-10-CM | POA: Insufficient documentation

## 2013-11-16 DIAGNOSIS — Z8659 Personal history of other mental and behavioral disorders: Secondary | ICD-10-CM | POA: Insufficient documentation

## 2013-11-16 DIAGNOSIS — Z794 Long term (current) use of insulin: Secondary | ICD-10-CM | POA: Insufficient documentation

## 2013-11-16 LAB — D-DIMER, QUANTITATIVE: D-Dimer, Quant: <0.27 ug/mL-FEU (ref 0.00–0.48)

## 2013-11-16 LAB — CBC WITH DIFFERENTIAL/PLATELET
BASOS ABS: 0 10*3/uL (ref 0.0–0.1)
Basophils Relative: 0 % (ref 0–1)
Eosinophils Absolute: 0.2 10*3/uL (ref 0.0–0.7)
Eosinophils Relative: 2 % (ref 0–5)
HCT: 39 % (ref 36.0–46.0)
Hemoglobin: 12.8 g/dL (ref 12.0–15.0)
Lymphocytes Relative: 38 % (ref 12–46)
Lymphs Abs: 4.2 10*3/uL — ABNORMAL HIGH (ref 0.7–4.0)
MCH: 26.6 pg (ref 26.0–34.0)
MCHC: 32.8 g/dL (ref 30.0–36.0)
MCV: 80.9 fL (ref 78.0–100.0)
MONO ABS: 0.4 10*3/uL (ref 0.1–1.0)
MONOS PCT: 3 % (ref 3–12)
NEUTROS ABS: 6.3 10*3/uL (ref 1.7–7.7)
NEUTROS PCT: 57 % (ref 43–77)
Platelets: 490 10*3/uL — ABNORMAL HIGH (ref 150–400)
RBC: 4.82 MIL/uL (ref 3.87–5.11)
RDW: 13.8 % (ref 11.5–15.5)
WBC: 11.1 10*3/uL — ABNORMAL HIGH (ref 4.0–10.5)

## 2013-11-16 LAB — BASIC METABOLIC PANEL
BUN: 10 mg/dL (ref 6–23)
CHLORIDE: 93 meq/L — AB (ref 96–112)
CO2: 22 mEq/L (ref 19–32)
CREATININE: 0.49 mg/dL — AB (ref 0.50–1.10)
Calcium: 9.4 mg/dL (ref 8.4–10.5)
Glucose, Bld: 357 mg/dL — ABNORMAL HIGH (ref 70–99)
POTASSIUM: 3.9 meq/L (ref 3.7–5.3)
Sodium: 133 mEq/L — ABNORMAL LOW (ref 137–147)

## 2013-11-16 LAB — I-STAT TROPONIN, ED: TROPONIN I, POC: 0 ng/mL (ref 0.00–0.08)

## 2013-11-16 MED ORDER — ALBUTEROL SULFATE (5 MG/ML) 0.5% IN NEBU
2.5000 mg | INHALATION_SOLUTION | Freq: Once | RESPIRATORY_TRACT | Status: AC
  Administered 2013-11-16: 2.5 mg via RESPIRATORY_TRACT

## 2013-11-16 MED ORDER — GI COCKTAIL ~~LOC~~
30.0000 mL | Freq: Once | ORAL | Status: AC
  Administered 2013-11-16: 30 mL via ORAL
  Filled 2013-11-16: qty 30

## 2013-11-16 MED ORDER — ALBUTEROL SULFATE (2.5 MG/3ML) 0.083% IN NEBU: 2.5000 mg | INHALATION_SOLUTION | RESPIRATORY_TRACT | Status: DC | PRN

## 2013-11-16 MED ORDER — PANTOPRAZOLE SODIUM 40 MG PO TBEC
40.0000 mg | DELAYED_RELEASE_TABLET | Freq: Once | ORAL | Status: AC
  Administered 2013-11-16: 40 mg via ORAL
  Filled 2013-11-16: qty 1

## 2013-11-16 MED ORDER — ALBUTEROL SULFATE HFA 108 (90 BASE) MCG/ACT IN AERS: 2.0000 | INHALATION_SPRAY | RESPIRATORY_TRACT | Status: DC | PRN

## 2013-11-16 MED ORDER — IPRATROPIUM-ALBUTEROL 0.5-2.5 (3) MG/3ML IN SOLN
3.0000 mL | Freq: Once | RESPIRATORY_TRACT | Status: AC
  Administered 2013-11-16: 3 mL via RESPIRATORY_TRACT

## 2013-11-16 MED ORDER — OMEPRAZOLE 20 MG PO CPDR: 20.0000 mg | DELAYED_RELEASE_CAPSULE | Freq: Every day | ORAL | Status: DC

## 2013-11-16 NOTE — ED Notes (Addendum)
Pt states when she takes deep breaths she has midsternum chest pain. Pt c/o chest tightness. Pt had a breathing treatment at [facility] for kids. Pt has been nauseated, no dizziness/lightheadness. NAD noted.

## 2013-11-16 NOTE — ED Notes (Signed)
NAD noted. Pt denies chest pain at discharge. Pt given discharge instructions, all questions answered clearly. Pt discharged home with family. Pt ambulatory on discharge.

## 2013-11-16 NOTE — Progress Notes (Signed)
History was provided by the patient and mother.  Mercedes Valdez is a 18 y.o. female who is here for chest pain and shortness of breath.     HPI:  18 year old female with history of asthma now with chest pain and shortness of breath.  Her last asthma flare was about 4 years ago.  She is unsure of her asthma triggers.  She does have type 2 diabetes and a family history of sudden cardiac death (her father passed away from a heart attack at age 14).  Upon further questioning, she denies any cough or wheezing, but she does report shortness of breath the worsens when she lays down.   Her chest pain is substernal and radiates to her back.  The pain is described as a "tightness" which worsens with a deep breath.  She has several siblings who are sick with congestion and cough.   No history of injury to the chest.  ROS: No fevers, no congestion, + fatigue, no abdominal pain, no sore throat.    The following portions of the patient's history were reviewed and updated as appropriate: allergies, current medications, past family history, past medical history and problem list.  Physical Exam:  Pulse 98  Temp(Src) 98.4 F (36.9 C) (Temporal)  Wt 238 lb 12.8 oz (108.319 kg)  SpO2 98%  Facility age limit for growth percentiles is 20 years. No LMP recorded.   General:   alert, cooperative and appears uncomfortable, breathing shallowly     Skin:   acanthosis nigricans present on the neck  Oral cavity:   lips, mucosa, and tongue normal; teeth and gums normal  Eyes:   sclerae white, pupils equal and reactive  Ears:   normal bilaterally  Nose: clear, no discharge  Neck:   trachea midline, no crepitus, symmetric thyroid  Lungs:  breath sounds dimished throughout likely due to lack of effort/pain, no wheezes/rhonchi/crackles  Heart:   regular rate and rhythm, S1, S2 normal, no murmur, click, rub or gallop   MSK:  chest pain is reproducible with palpation over the sternum and costochondral junctions   Abdomen:  soft, nontender, obese  Neuro:  PERLA and appears tired, but responds appropriately to questions    Assessment/Plan:  18 year old female with asthma, type II diabetes and family history of sudden cardiac death now with chest pain, dyspnea, and orthopnea consistent with possible congestive heart failure.  No wheezes heard on exam, but patient given a Duoneb (5 mg Albuterol/0.5 mg Atrovent) during triage due to dyspnea.   No change with albuterol neb.  Repeat exam with good respiratory effort shows clear and equal breath sounds throughout.  Given her diabetes and family history of sudden cardiac death, patient referred directly to the ED for further evaluation of chest painn.  Mother reports that she will take the patient directly to the ER.  Albuterol inhaler and nebulizer refilled per mother's request.  - Immunizations today: none  - Follow-up visit as needed.    Lamarr Lulas, MD  11/16/2013

## 2013-11-16 NOTE — Patient Instructions (Signed)
Please take Mercedes Valdez to the ER at [facility] to have her chest pain further evaluated.

## 2013-11-16 NOTE — ED Notes (Signed)
Pt presents to department for evaluation of midsternal chest pain and SOB. Ongoing x1 day. 7/10 pain upon arrival, states pain becomes worse with movement and deep breathing. Respirations unlabored. Pt is alert and oriented x4. NAD.

## 2013-11-16 NOTE — Discharge Instructions (Signed)
Gastroesophageal Reflux Disease, Adult Gastroesophageal reflux disease (GERD) happens when acid from your stomach flows up into the esophagus. When acid comes in contact with the esophagus, the acid causes soreness (inflammation) in the esophagus. Over time, GERD may create small holes (ulcers) in the lining of the esophagus. CAUSES   Increased body weight. This puts pressure on the stomach, making acid rise from the stomach into the esophagus.  Smoking. This increases acid production in the stomach.  Drinking alcohol. This causes decreased pressure in the lower esophageal sphincter (valve or ring of muscle between the esophagus and stomach), allowing acid from the stomach into the esophagus.  Late evening meals and a full stomach. This increases pressure and acid production in the stomach.  A malformed lower esophageal sphincter. Sometimes, no cause is found. SYMPTOMS   Burning pain in the lower part of the mid-chest behind the breastbone and in the mid-stomach area. This may occur twice a week or more often.  Trouble swallowing.  Sore throat.  Dry cough.  Asthma-like symptoms including chest tightness, shortness of breath, or wheezing. DIAGNOSIS  Your caregiver may be able to diagnose GERD based on your symptoms. In some cases, X-rays and other tests may be done to check for complications or to check the condition of your stomach and esophagus. TREATMENT  Your caregiver may recommend over-the-counter or prescription medicines to help decrease acid production. Ask your caregiver before starting or adding any new medicines.  HOME CARE INSTRUCTIONS   Change the factors that you can control. Ask your caregiver for guidance concerning weight loss, quitting smoking, and alcohol consumption.  Avoid foods and drinks that make your symptoms worse, such as:  Caffeine or alcoholic drinks.  Chocolate.  Peppermint or mint flavorings.  Garlic and onions.  Spicy foods.  Citrus fruits,  such as oranges, lemons, or limes.  Tomato-based foods such as sauce, chili, salsa, and pizza.  Fried and fatty foods.  Avoid lying down for the 3 hours prior to your bedtime or prior to taking a nap.  Eat small, frequent meals instead of large meals.  Wear loose-fitting clothing. Do not wear anything tight around your waist that causes pressure on your stomach.  Raise the head of your bed 6 to 8 inches with wood blocks to help you sleep. Extra pillows will not help.  Only take over-the-counter or prescription medicines for pain, discomfort, or fever as directed by your caregiver.  Do not take aspirin, ibuprofen, or other nonsteroidal anti-inflammatory drugs (NSAIDs). SEEK IMMEDIATE MEDICAL CARE IF:   You have pain in your arms, neck, jaw, teeth, or back.  Your pain increases or changes in intensity or duration.  You develop nausea, vomiting, or sweating (diaphoresis).  You develop shortness of breath, or you faint.  Your vomit is green, yellow, black, or looks like coffee grounds or blood.  Your stool is red, bloody, or black. These symptoms could be signs of other problems, such as heart disease, gastric bleeding, or esophageal bleeding. MAKE SURE YOU:   Understand these instructions.  Will watch your condition.  Will get help right away if you are not doing well or get worse. Document Released: 02/19/2005 Document Revised: 08/04/2011 Document Reviewed: 11/29/2010 [employer] Patient Information 2015 [employer]. This information is not intended to replace advice given to you by your health care provider. Make sure you discuss any questions you have with your health care provider.  

## 2013-11-16 NOTE — ED Provider Notes (Signed)
CSN: [id number]     Arrival date & time 11/16/13  1751 History   First MD Initiated Contact with Patient 11/16/13 2044     Chief Complaint  Patient presents with  . Chest Pain  . Shortness of Breath     (Consider location/radiation/quality/duration/timing/severity/associated sxs/prior Treatment) HPI Comments: Patient is a 18 year old female with a history of asthma, type 2 diabetes mellitus, and PCL lasts who presents to the emergency department today for chest pain. Patient states that chest pain has been intermittent over the last 2 days. She describes the pain as sharp, located in her substernal region, and nonradiating. Pain is worse with eating and drinking as well as deep breathing. Pain also worse when supine. Patient states that she had some shortness of breath and chest tightness, but attributed this to her asthma. Shortness of breath resolved prior to arrival with albuterol treatment. Patient denies any shortness of breath at present time. Patient tried drinking some soda and belching for pain without relief. Patient denies associated fever, cough, hemoptysis, vomiting, numbness/tingling and extremity weakness. No hx of cardiac arrythmias.  Patient is a 18 y.o. female presenting with chest pain and shortness of breath. The history is provided by the patient and a parent. No language interpreter was used.  Chest Pain Associated symptoms: nausea and shortness of breath   Associated symptoms: no abdominal pain, no cough, no fever and not vomiting   Shortness of Breath Associated symptoms: chest pain   Associated symptoms: no abdominal pain, no cough, no fever and no vomiting     Past Medical History  Diagnosis Date  . ADHD (attention deficit hyperactivity disorder)   . Diabetes mellitus     Type 2  . Obesity   . PCOS (polycystic ovarian syndrome)   . Elevated blood pressure   . Asthma    Past Surgical History  Procedure Laterality Date  . External ear surgery      ear tubes    Family History  Problem Relation Age of Onset  . Diabetes Mother   . Vision loss Mother   . ADD / ADHD Brother   . Allergies Brother   . Asthma Brother   . Vision loss Brother   . Cancer Paternal Grandfather     Colon Cancer  . Heart disease Father 15    died from MI at age 30   History  Substance Use Topics  . Smoking status: Current Every Day Smoker    Types: Cigarettes  . Smokeless tobacco: Not on file  . Alcohol Use: No   OB History   Grav Para Term Preterm Abortions TAB SAB Ect Mult Living                  Review of Systems  Constitutional: Negative for fever.  Respiratory: Positive for chest tightness and shortness of breath. Negative for cough.   Cardiovascular: Positive for chest pain.  Gastrointestinal: Positive for nausea. Negative for vomiting, abdominal pain and diarrhea.  Neurological: Negative for syncope.  All other systems reviewed and are negative.    Allergies  Amoxicillin; Amoxicillin-pot clavulanate; Cephalosporins; Eryzole; NWG:NFAOZHYQ; and Penicillins  Home Medications   Prior to Admission medications   Medication Sig Start Date End Date Taking? Authorizing Vanesha Athens  albuterol (PROVENTIL HFA;VENTOLIN HFA) 108 (90 BASE) MCG/ACT inhaler Inhale 2 puffs into the lungs every 4 (four) hours as needed for wheezing. 11/16/13  Yes Lamarr Lulas, MD  albuterol (PROVENTIL) (2.5 MG/3ML) 0.083% nebulizer solution Take 3 mLs (2.5 mg  total) by nebulization every 4 (four) hours as needed for wheezing or shortness of breath. 11/16/13  Yes Lamarr Lulas, MD  ibuprofen (ADVIL,MOTRIN) 200 MG tablet Take 600 mg by mouth every 6 (six) hours as needed.   Yes Historical Allicia Culley, MD  insulin glargine (LANTUS) 100 UNIT/ML injection Inject 25 Units into the skin at bedtime.   Yes Historical Ellyn Rubiano, MD  Insulin Pen Needle 31G X 5 MM MISC Use 1 time daily as instructed. 04/12/13  Yes Philemon Kingdom, MD  metFORMIN (GLUMETZA) 500 MG (MOD) 24 hr tablet Take 500 mg by  mouth 2 (two) times daily.   Yes Historical Ashby Leflore, MD  naproxen sodium (ANAPROX) 220 MG tablet Take 220 mg by mouth 2 (two) times daily as needed.   Yes Historical Yuvin Bussiere, MD  omeprazole (PRILOSEC) 20 MG capsule Take 1 capsule (20 mg total) by mouth daily. 11/16/13   Antonietta Breach, PA-C   BP 135/87  Pulse 97  Temp(Src) 98.5 F (36.9 C) (Oral)  Resp 18  SpO2 99% Physical Exam  Nursing note and vitals reviewed. Constitutional: She is oriented to person, place, and time. She appears well-developed and well-nourished. No distress.  HENT:  Head: Normocephalic and atraumatic.  Mouth/Throat: Oropharynx is clear and moist. No oropharyngeal exudate.  Eyes: Conjunctivae and EOM are normal. Pupils are equal, round, and reactive to light. No scleral icterus.  Neck: Normal range of motion.  Cardiovascular: Normal rate, regular rhythm, normal heart sounds and intact distal pulses.   Distal radial pulse 2+ bilaterally  Pulmonary/Chest: Effort normal and breath sounds normal. No respiratory distress. She has no wheezes. She has no rales.  Abdominal: Soft. There is no tenderness. There is no rebound and no guarding.  Soft obese abdomen without tenderness or peritoneal signs.  Musculoskeletal: Normal range of motion.  Neurological: She is alert and oriented to person, place, and time.  GCS 15. Patient moves extremities without ataxia.  Skin: Skin is warm and dry. No rash noted. She is not diaphoretic. No erythema. No pallor.  Psychiatric: She has a normal mood and affect. Her behavior is normal.    ED Course  Procedures (including critical care time) Labs Review Labs Reviewed  CBC WITH DIFFERENTIAL - Abnormal; Notable for the following:    WBC 11.1 (*)    Platelets 490 (*)    Lymphs Abs 4.2 (*)    All other components within normal limits  BASIC METABOLIC PANEL - Abnormal; Notable for the following:    Sodium 133 (*)    Chloride 93 (*)    Glucose, Bld 357 (*)    Creatinine, Ser 0.49 (*)     All other components within normal limits  D-DIMER, QUANTITATIVE  I-STAT TROPOININ, ED    Imaging Review Dg Chest 2 View  11/16/2013   CLINICAL DATA:  Chest pain and shortness of breath.  EXAM: CHEST  2 VIEW  COMPARISON:  None.  FINDINGS: The heart size and mediastinal contours are within normal limits. Both lungs are clear. The visualized skeletal structures are unremarkable.  IMPRESSION: Normal chest.   Electronically Signed   By: Rozetta Nunnery M.D.   On: 11/16/2013 18:52     EKG Interpretation   Date/Time:  Wednesday November 16 2013 17:55:25 EDT Ventricular Rate:  109 PR Interval:  122 QRS Duration: 84 QT Interval:  340 QTC Calculation: 457 R Axis:   81 Text Interpretation:  Sinus tachycardia Nonspecific T wave abnormality  Confirmed by HARRISON  MD, FORREST ([id number]) on 11/16/2013 11:12:35  PM      MDM   Final diagnoses:  Gastroesophageal reflux disease, esophagitis presence not specified  Atypical chest pain    18 year old female presents to the emergency department for midsternal, sharp chest pain x2 days which is worse with eating, drinking, when supine, and with deep breathing. Patient is well and nontoxic appearing, hemodynamically stable, and afebrile. Physical exam unremarkable and patient endorses improvement in symptoms with GI cocktail. Protonix tablet also given. ACS workup today is negative. Symptoms also atypical for ACS, therefore, my suspicion for ACS is low in this patient. Chest x-ray shows no evidence of mediastinal widening, focal consolidation, pneumonia, pneumothorax, pleural effusion, or other acute cardiopulmonary abnormality. Patient has been intermittently tachycardic since arrival. D-dimer ordered to evaluate for pulmonary embolism. Dimer today is negative. Patient also denies risk factors such as recent surgery or hospitalization, prolonged travel, and the use of birth control.  Given the symptoms are aggravated with food/drink intake and that symptoms  improved with GI cocktail, suspect that chest pain is secondary to esophageal reflux. Patient will be started on Prilosec and have recommended pediatric followup for further evaluation of symptoms. Parents requesting referral to gastroenterologist which I have provided. Return precautions discussed and provided as well. Patient and parents are agreeable to plan with no unaddressed concerns.   Filed Vitals:   11/16/13 2130 11/16/13 2145 11/16/13 2200 11/16/13 2219  BP: 135/81  135/87 133/85  Pulse: 96 97  89  Temp:    97.4 F (36.3 C)  TempSrc:    Oral  Resp: 21 18  25   SpO2: 99% 99%  100%     Antonietta Breach, PA-C 11/16/13 2314

## 2013-11-17 NOTE — ED Provider Notes (Signed)
Medical screening examination/treatment/procedure(s) were performed by non-physician practitioner and as supervising physician I was immediately available for consultation/collaboration.   EKG Interpretation   Date/Time:  Wednesday November 16 2013 17:55:25 EDT Ventricular Rate:  109 PR Interval:  122 QRS Duration: 84 QT Interval:  340 QTC Calculation: 457 R Axis:   81 Text Interpretation:  Sinus tachycardia Nonspecific T wave abnormality  Confirmed by Aline Brochure  MD, FORREST ([id number]) on 11/16/2013 11:12:35 PM        Shea Evans Ruthe Mannan, MD 11/17/13 [phone]

## 2013-11-21 ENCOUNTER — Ambulatory Visit (INDEPENDENT_AMBULATORY_CARE_PROVIDER_SITE_OTHER): Payer: Medicaid Other | Admitting: Pediatrics

## 2013-11-21 ENCOUNTER — Telehealth: Payer: Self-pay | Admitting: *Deleted

## 2013-11-21 ENCOUNTER — Encounter: Payer: Self-pay | Admitting: Pediatrics

## 2013-11-21 VITALS — BP 126/86 | Ht 64.57 in | Wt 234.0 lb

## 2013-11-21 DIAGNOSIS — K2970 Gastritis, unspecified, without bleeding: Secondary | ICD-10-CM

## 2013-11-21 DIAGNOSIS — M7918 Myalgia, other site: Secondary | ICD-10-CM

## 2013-11-21 DIAGNOSIS — K2990 Gastroduodenitis, unspecified, without bleeding: Principal | ICD-10-CM

## 2013-11-21 DIAGNOSIS — L732 Hidradenitis suppurativa: Secondary | ICD-10-CM

## 2013-11-21 DIAGNOSIS — IMO0001 Reserved for inherently not codable concepts without codable children: Secondary | ICD-10-CM

## 2013-11-21 DIAGNOSIS — E1159 Type 2 diabetes mellitus with other circulatory complications: Secondary | ICD-10-CM

## 2013-11-21 MED ORDER — OMEPRAZOLE 20 MG PO CPDR: 20.0000 mg | DELAYED_RELEASE_CAPSULE | Freq: Every day | ORAL | Status: DC

## 2013-11-21 MED ORDER — METFORMIN HCL ER (MOD) 500 MG PO TB24: 500.0000 mg | ORAL_TABLET | Freq: Two times a day (BID) | ORAL | Status: DC

## 2013-11-21 MED ORDER — NAPROXEN SODIUM 220 MG PO TABS: 220.0000 mg | ORAL_TABLET | Freq: Two times a day (BID) | ORAL | Status: DC | PRN

## 2013-11-21 NOTE — Telephone Encounter (Signed)
Mercedes Valdez from [place] wanted to let us know that the 220 mg Naproxen is OTC and not covered by insurance. The 250 mg is so Dr. Ulla Gallo the change to 250 mg. Also the [facility] requires prior authorization and the pharmacy will do that electronically. I faxed the omeprazole prescription to the pharmacy.

## 2013-11-21 NOTE — Progress Notes (Signed)
History was provided by the patient and mother.  Mercedes Valdez is a 18 y.o. female who is here for follow up of chest pain that brought her to the ED.  She has been having central sternal chest pain every time she eats.  She had cardiac work up including EKG and chest XR and Ddimer that were normal at the ED.  Pain improved with GI cocktail and she was sent home with rx for Prilosec.  Mom encouraged her to take TUMS as well but she refuses d/t the taste.  The pain has not changed in severity, quality or frequency since being seen in the ED.  Mercedes Valdez indicates she does not drink a lot of soda or caffeine containing beverages.  She takes naproxen BID for knee pain that is not treated with tylenol.    In addition she is out of metformin.  Mom indicates she had an appointment but they canceled on her and did not call again for rescheduling.  She is almost out of Lantus as well.    Mercedes Valdez also has been having pain under her right arm.  She has a hx of several boils and has been using a warm compress today but it has not come to a head yet.  She has not had fever.   Physical Exam:  BP 126/86  Ht 5' 4.57" (1.64 m)  Wt 234 lb (106.142 kg)  BMI 39.46 kg/m2    General:   alert, no distress and morbidly obese     Skin:   tender indurated area under right arm approximately 2 cm in length without fluctuance or erythema  Lungs:  clear to auscultation bilaterally  Heart:   regular rate and rhythm, S1, S2 normal, no murmur, click, rub or gallop   Abdomen:  tender to palpation in most superior area of epigastrum, decreased bowel sounds   Extremities:   extremities normal, atraumatic, no cyanosis or edema, surgical scar on right knee, no joint tenderness or effusion  Neuro:  normal without focal findings    Assessment/Plan: 1. Hidradenitis suppurativa of right axilla - No area that could be drained at this time.  No signs of systemic infection or overlying cellulitis.  Encouraged continued treatment  with warm compresses until it opens on its own.  Discouraged squeezing or trying to pop it.  Return if not improving or she develops fever, streaking or develops growing redness around the area.  2. Unspecified gastritis and gastroduodenitis without mention of hemorrhage Chest pain is most consistent with GERD.  Encouraged avoiding caffeine and alcohol.  Recommended decreasing the amount of naproxen she is taking.  Pt indicated this would be impossible d/t severity of knee pain.  Mom specifically asked for GI referral.  Instructed to continue on Prilosec while waiting to be seen by GI. - omeprazole (PRILOSEC) 20 MG capsule; Take 1 capsule (20 mg total) by mouth daily.  Dispense: 30 capsule; Refill: 3 - Ambulatory referral to Gastroenterology  3. Type II or unspecified type diabetes mellitus with peripheral circulatory disorders, uncontrolled(250.72) Usually seen by Dr. Cruzita Lederer for endo, will refill 1 month of metformin as she is completely out at this time but instructed to make an appointment with Dr. Cruzita Lederer to refill Lantus and follow up.   - metFORMIN (GLUMETZA) 500 MG (MOD) 24 hr tablet; Take 1 tablet (500 mg total) by mouth 2 (two) times daily.  Dispense: 60 tablet; Refill: 0  4. Musculoskeletal pain Encouraged decreasing naproxen for knee pain as gastritis/GERD symptoms would  only be aggravated by this.  Encouraged use of compression sleeve while exercising and icing knee afterward.   - naproxen sodium (ANAPROX) 220 MG tablet; Take 1 tablet (220 mg total) by mouth 2 (two) times daily as needed.  Dispense: 60 tablet; Refill: 3  - Follow-up visit as needed.    Janelle Floor, MD  11/21/2013

## 2013-11-21 NOTE — Progress Notes (Signed)
I discussed the findings with the resident and helped develop the management plan described in the resident's note. I agree with the content. I helped clarify with pharmacy the dosage of naproxen (250 mg instead of 220) and asked RN to order omeprazole when prescription printed instead of going electronically.  I have reviewed the billing and charges.  Christean Leaf MD 11/21/2013  6:14 PM

## 2013-11-21 NOTE — Patient Instructions (Signed)
BOIL:    Apply a warm compress to the area as often as possible.  Do not try to pop the boil, let it open on its own.  Try to keep the area as clean as possible.  Reflux: Try to decrease the amount of motrin or naproxen that you're taking as this can aggravate symptoms.  Continue to take the prilosec until seen by the GI specialist.    Knee pain: Try a compression sleeve and ICE the area after exercise.

## 2013-11-23 ENCOUNTER — Other Ambulatory Visit: Payer: Self-pay | Admitting: Pediatrics

## 2013-11-23 DIAGNOSIS — K2990 Gastroduodenitis, unspecified, without bleeding: Principal | ICD-10-CM

## 2013-11-23 DIAGNOSIS — K2970 Gastritis, unspecified, without bleeding: Secondary | ICD-10-CM

## 2013-11-23 NOTE — Progress Notes (Addendum)
Referral to Dr. Benson Norway has been done. Faxed over progress notes,demographics with insurance card and labs to [phone]. Waiting for Dr. Benson Norway to review notes to make the appointment for patient. Referral entry #: [id number] was already made. Cancelled referral made on 11/23/2013.

## 2013-11-23 NOTE — Addendum Note (Signed)
Addended by: Gari Crown on: 11/23/2013 09:44 AM   Modules accepted: Orders

## 2013-12-29 ENCOUNTER — Other Ambulatory Visit: Payer: Self-pay | Admitting: Pediatrics

## 2013-12-29 MED ORDER — METFORMIN HCL 500 MG PO TABS: 500.0000 mg | ORAL_TABLET | Freq: Two times a day (BID) | ORAL | Status: DC

## 2013-12-29 NOTE — Telephone Encounter (Signed)
Received prior auth request for refill for patient medication.  Brand name was specified for patient.  Will refill with generic.

## 2014-01-20 ENCOUNTER — Telehealth: Payer: Self-pay

## 2014-01-20 NOTE — Telephone Encounter (Signed)
Diabetic Bundle, lvom to call back and schedule appointment.

## 2014-02-27 ENCOUNTER — Ambulatory Visit (INDEPENDENT_AMBULATORY_CARE_PROVIDER_SITE_OTHER): Payer: Medicaid Other | Admitting: Pediatrics

## 2014-02-27 ENCOUNTER — Encounter: Payer: Self-pay | Admitting: Pediatrics

## 2014-02-27 VITALS — BP 120/80 | Temp 96.0°F | Wt 233.0 lb

## 2014-02-27 DIAGNOSIS — Z889 Allergy status to unspecified drugs, medicaments and biological substances status: Secondary | ICD-10-CM | POA: Insufficient documentation

## 2014-02-27 DIAGNOSIS — L049 Acute lymphadenitis, unspecified: Secondary | ICD-10-CM

## 2014-02-27 DIAGNOSIS — IMO0002 Reserved for concepts with insufficient information to code with codable children: Secondary | ICD-10-CM

## 2014-02-27 DIAGNOSIS — Z3202 Encounter for pregnancy test, result negative: Secondary | ICD-10-CM

## 2014-02-27 DIAGNOSIS — J029 Acute pharyngitis, unspecified: Secondary | ICD-10-CM

## 2014-02-27 LAB — POCT URINE PREGNANCY: PREG TEST UR: NEGATIVE

## 2014-02-27 LAB — POCT RAPID STREP A (OFFICE): Rapid Strep A Screen: NEGATIVE

## 2014-02-27 MED ORDER — CLINDAMYCIN HCL 300 MG PO CAPS: 300.0000 mg | ORAL_CAPSULE | Freq: Three times a day (TID) | ORAL | Status: AC

## 2014-02-27 NOTE — Patient Instructions (Signed)
Take medication as instructed. Call if pain is worse.  Plan to take the medication for full 10 days even if you get better quickly.  The best website for information about children is [employer]end[employer].  All the information is reliable and up-to-date.     At every age, encourage reading.  Reading with your child is one of the best activities you can do.   Use the [employer] near your home and borrow new books every week!  Call the main number [phone] before going to the Emergency Department unless it's a true emergency.  For a true emergency, go to the [facility] Emergency Department.  A nurse always answers the main number [phone] and a doctor is always available, even when the clinic is closed.    Clinic is open for sick visits only on Saturday mornings from 8:30AM to 12:30PM. Call first thing on Saturday morning for an appointment.

## 2014-02-27 NOTE — Progress Notes (Signed)
Subjective:     Patient ID: Mercedes Valdez, female   DOB: 10/14/1995, 18 y.o.   MRN: [id number]  HPI Had some food cooked on same hibachi as seafood last Friday. Pain started in neck/throat soon after.  Getting worse.  Sometimes hard to swallow. No treatments at home.  No known ill contacts. No URI symptoms.  Rapid strep negative here today.  Chart shows allergies to multiple antibiotics during early childhood.  According to mother, most were skin reactions like hives.   Never seen by allergist.    Review of Systems  Constitutional: Negative for fever, activity change, appetite change and fatigue.  HENT: Negative.   Eyes: Negative.   Respiratory: Negative for cough and wheezing.   Cardiovascular: Negative for chest pain.  Gastrointestinal: Negative.   Skin: Negative.    Objective:   Physical Exam  Nursing note and vitals reviewed. Constitutional: She is oriented to person, place, and time. She appears well-nourished.  Obese, lying on side on exam table.  HENT:  Head: Normocephalic.  Right Ear: External ear normal.  Left Ear: External ear normal.  Nose: Nose normal.  Mouth/Throat: Oropharynx is clear and moist. No oropharyngeal exudate.  Eyes: Conjunctivae and EOM are normal.  Neck: Neck supple. No thyromegaly present.  Left anterior tenderness, diffusely swollen, no erythema, no warmth to touch.   Cardiovascular: Normal rate, regular rhythm and normal heart sounds.   No murmur heard. Pulmonary/Chest: Effort normal and breath sounds normal.  Abdominal: Soft. Bowel sounds are normal. She exhibits no mass.  Lymphadenopathy:    She has cervical adenopathy.  Neurological: She is alert and oriented to person, place, and time.  Skin: Skin is warm.       Assessment:     Lymphadenitis Allergy to seafood extremely unlikely     Plan:   RST negative - doubt value of throat culture.  Will not order.  Try clindamycin Needs referral to allergist for testing.  Current list of  allergies seems unlikely and also very limiting for treatment.

## 2014-03-23 ENCOUNTER — Telehealth: Payer: Self-pay | Admitting: Pediatrics

## 2014-03-23 NOTE — Telephone Encounter (Signed)
Called mother and reached her on 2nd attempt to follow up on poor outcome of visit at Dr Shaune Leeks. Mercedes Valdez reportedly has allergy to penicillins, augmentin, eryzole, and cefixime.  Visit was to establish true allergy to medications.  Referral coordinator here did follow up call with Dr Shaune Leeks' office, which reported rude behavior by Mother.  Mother continues to be a little offended but what she perceived as lack of proper attention, and desires to return to same office with different doctor, or to a different doctor. Will try to arrange.

## 2014-05-26 DIAGNOSIS — Z8619 Personal history of other infectious and parasitic diseases: Secondary | ICD-10-CM

## 2014-05-26 HISTORY — DX: Personal history of other infectious and parasitic diseases: Z86.19

## 2014-06-22 ENCOUNTER — Emergency Department (HOSPITAL_COMMUNITY)
Admission: EM | Admit: 2014-06-22 | Discharge: 2014-06-22 | Disposition: A | Discharge status: Home / Self Care | Payer: Medicaid Other | Attending: Emergency Medicine | Admitting: Emergency Medicine

## 2014-06-22 ENCOUNTER — Encounter (HOSPITAL_COMMUNITY): Payer: Self-pay

## 2014-06-22 DIAGNOSIS — E669 Obesity, unspecified: Secondary | ICD-10-CM | POA: Diagnosis not present

## 2014-06-22 DIAGNOSIS — Z720 Tobacco use: Secondary | ICD-10-CM | POA: Insufficient documentation

## 2014-06-22 DIAGNOSIS — R21 Rash and other nonspecific skin eruption: Secondary | ICD-10-CM | POA: Diagnosis present

## 2014-06-22 DIAGNOSIS — Z3202 Encounter for pregnancy test, result negative: Secondary | ICD-10-CM | POA: Insufficient documentation

## 2014-06-22 DIAGNOSIS — J45909 Unspecified asthma, uncomplicated: Secondary | ICD-10-CM | POA: Insufficient documentation

## 2014-06-22 DIAGNOSIS — Z8659 Personal history of other mental and behavioral disorders: Secondary | ICD-10-CM | POA: Diagnosis not present

## 2014-06-22 DIAGNOSIS — Z9119 Patient's noncompliance with other medical treatment and regimen: Secondary | ICD-10-CM

## 2014-06-22 DIAGNOSIS — Z9114 Patient's other noncompliance with medication regimen: Secondary | ICD-10-CM | POA: Diagnosis not present

## 2014-06-22 DIAGNOSIS — I10 Essential (primary) hypertension: Secondary | ICD-10-CM | POA: Diagnosis not present

## 2014-06-22 DIAGNOSIS — Z880 Allergy status to penicillin: Secondary | ICD-10-CM | POA: Diagnosis not present

## 2014-06-22 DIAGNOSIS — Z79899 Other long term (current) drug therapy: Secondary | ICD-10-CM | POA: Insufficient documentation

## 2014-06-22 DIAGNOSIS — Z791 Long term (current) use of non-steroidal anti-inflammatories (NSAID): Secondary | ICD-10-CM | POA: Diagnosis not present

## 2014-06-22 DIAGNOSIS — Z794 Long term (current) use of insulin: Secondary | ICD-10-CM | POA: Insufficient documentation

## 2014-06-22 DIAGNOSIS — E1165 Type 2 diabetes mellitus with hyperglycemia: Secondary | ICD-10-CM | POA: Insufficient documentation

## 2014-06-22 DIAGNOSIS — R739 Hyperglycemia, unspecified: Secondary | ICD-10-CM

## 2014-06-22 DIAGNOSIS — R208 Other disturbances of skin sensation: Secondary | ICD-10-CM | POA: Diagnosis not present

## 2014-06-22 DIAGNOSIS — Z91199 Patient's noncompliance with other medical treatment and regimen due to unspecified reason: Secondary | ICD-10-CM

## 2014-06-22 DIAGNOSIS — R238 Other skin changes: Secondary | ICD-10-CM

## 2014-06-22 LAB — BASIC METABOLIC PANEL
Anion gap: 9 (ref 5–15)
BUN: 6 mg/dL (ref 6–23)
CALCIUM: 8.9 mg/dL (ref 8.4–10.5)
CO2: 24 mmol/L (ref 19–32)
CREATININE: 0.56 mg/dL (ref 0.50–1.10)
Chloride: 102 mmol/L (ref 96–112)
GFR calc non Af Amer: >90 mL/min (ref >90)
Glucose, Bld: 353 mg/dL — ABNORMAL HIGH (ref 70–99)
Potassium: 4.1 mmol/L (ref 3.5–5.1)
Sodium: 135 mmol/L (ref 135–145)

## 2014-06-22 LAB — CBG MONITORING, ED: Glucose-Capillary: 348 mg/dL — ABNORMAL HIGH (ref 70–99)

## 2014-06-22 LAB — KETONES, QUALITATIVE: Acetone, Bld: NEGATIVE

## 2014-06-22 LAB — POC URINE PREG, ED: Preg Test, Ur: NEGATIVE

## 2014-06-22 MED ORDER — CLINDAMYCIN HCL 150 MG PO CAPS: 300.0000 mg | ORAL_CAPSULE | Freq: Three times a day (TID) | ORAL | Status: DC

## 2014-06-22 NOTE — Discharge Instructions (Signed)
Please call your doctor for a followup appointment within 24-48 hours. When you talk to your doctor please let them know that you were seen in the emergency department and have them acquire all of your records so that they can discuss the findings with you and formulate a treatment plan to fully care for your new and ongoing problems. Please rest and stay hydrated Please take antibiotics as prescribed on a full stomach Please avoid any pulling of the hair Please do not place any ointment or topicals on the hair Highly recommend following up with primary care provider and getting insulin back on board regarding proper control for diabetes. For is diabetes is not controlled this can lead to serious health consequences that can be fatal. Please continue to monitor symptoms closely and if symptoms are to worsen or change (fever greater than 101, chills, sweating, nausea, vomiting, chest pain, shortness of breathe, difficulty breathing, weakness, numbness, tingling, worsening or changes to pain pattern, drainage, swelling, neck pain, neck stiffness, neck swelling, headache, dizziness) please report back to the Emergency Department immediately.    Blood Glucose Monitoring Monitoring your blood glucose (also know as blood sugar) helps you to manage your diabetes. It also helps you and your health care provider monitor your diabetes and determine how well your treatment plan is working. WHY SHOULD YOU MONITOR YOUR BLOOD GLUCOSE?  It can help you understand how food, exercise, and medicine affect your blood glucose.  It allows you to know what your blood glucose is at any given moment. You can quickly tell if you are having low blood glucose (hypoglycemia) or high blood glucose (hyperglycemia).  It can help you and your health care provider know how to adjust your medicines.  It can help you understand how to manage an illness or adjust medicine for exercise. WHEN SHOULD YOU TEST? Your health care  provider will help you decide how often you should check your blood glucose. This may depend on the type of diabetes you have, your diabetes control, or the types of medicines you are taking. Be sure to write down all of your blood glucose readings so that this information can be reviewed with your health care provider. See below for examples of testing times that your health care provider may suggest. Type 1 Diabetes  Test 4 times a day if you are in good control, using an insulin pump, or perform multiple daily injections.  If your diabetes is not well controlled or if you are sick, you may need to monitor more often.  It is a good idea to also monitor:  Before and after exercise.  Between meals and 2 hours after a meal.  Occasionally between 2:00 a.m. and 3:00 a.m. Type 2 Diabetes  It can vary with each person, but generally, if you are on insulin, test 4 times a day.  If you take medicines by mouth (orally), test 2 times a day.  If you are on a controlled diet, test once a day.  If your diabetes is not well controlled or if you are sick, you may need to monitor more often. HOW TO MONITOR YOUR BLOOD GLUCOSE Supplies Needed  Blood glucose meter.  Test strips for your meter. Each meter has its own strips. You must use the strips that go with your own meter.  A pricking needle (lancet).  A device that holds the lancet (lancing device).  A journal or log book to write down your results. Procedure  Wash your hands with soap  and water. Alcohol is not preferred.  Prick the side of your finger (not the tip) with the lancet.  Gently milk the finger until a small drop of blood appears.  Follow the instructions that come with your meter for inserting the test strip, applying blood to the strip, and using your blood glucose meter. Other Areas to Get Blood for Testing Some meters allow you to use other areas of your body (other than your finger) to test your blood. These areas are  called alternative sites. The most common alternative sites are:  The forearm.  The thigh.  The back area of the lower leg.  The palm of the hand. The blood flow in these areas is slower. Therefore, the blood glucose values you get may be delayed, and the numbers are different from what you would get from your fingers. Do not use alternative sites if you think you are having hypoglycemia. Your reading will not be accurate. Always use a finger if you are having hypoglycemia. Also, if you cannot feel your lows (hypoglycemia unawareness), always use your fingers for your blood glucose checks. ADDITIONAL TIPS FOR GLUCOSE MONITORING  Do not reuse lancets.  Always carry your supplies with you.  All blood glucose meters have a 24-hour "hotline" number to call if you have questions or need help.  Adjust (calibrate) your blood glucose meter with a control solution after finishing a few boxes of strips. BLOOD GLUCOSE RECORD KEEPING It is a good idea to keep a daily record or log of your blood glucose readings. Most glucose meters, if not all, keep your glucose records stored in the meter. Some meters come with the ability to download your records to your home computer. Keeping a record of your blood glucose readings is especially helpful if you are wanting to look for patterns. Make notes to go along with the blood glucose readings because you might forget what happened at that exact time. Keeping good records helps you and your health care provider to work together to achieve good diabetes management.  Document Released: 05/15/2003 Document Revised: 09/26/2013 Document Reviewed: 10/04/2012 [facility] Patient Information 2015 [place], [place]. This information is not intended to replace advice given to you by your health care provider. Make sure you discuss any questions you have with your health care provider.

## 2014-06-22 NOTE — ED Notes (Signed)
Pt c/o "sore" on back of head x 2 week. Pt reports she stopped taking her diabetic meds (Insulin and pill) 1 year ago.

## 2014-06-22 NOTE — ED Notes (Signed)
Pt sts she awoke with knot on head.  Thought it was due to braids in hair.  Denies drainage.  Sts it's painful.

## 2014-06-22 NOTE — ED Notes (Signed)
Attempted blood draw x 1 without success 

## 2014-06-22 NOTE — ED Provider Notes (Signed)
CSN: [id number]     Arrival date & time 06/22/14  1345 History  This chart was scribed for non-physician practitioner, Jamse Mead, PA-C, working with Jasper Riling. Alvino Chapel, MD, by Stephania Fragmin, ED Scribe. This patient was seen in room TR08C/TR08C and the patient's care was started at 2:43 PM.    No chief complaint on file.  The history is provided by the patient. No language interpreter was used.     HPI Comments: Mercedes Valdez is a 19 y.o. female with a history of DM, ADHD, obesity, PCOS, hypertension, and asthma, who presents to the Emergency Department complaining of a knot on her posterior head that began 2 weeks ago after she had braids placed in her hair. She states that it "started out as a pimple" that gradually grew larger. She complains of associated non-radiating sore pain to the area. Patient reports no prior treatment. When told her glucose taken here was high today, she mentioned that it has usually been around 300-400 ever since she was diagnosed with DM. She states she stopped taking insulin because "it hurts", but states she wants to get her DM under control soon. Patient denies drainage, fever, chills, blurred vision, loss of vision, neck pain, and neck stiffness. PCP Dr. Derrell Lolling   Past Medical History  Diagnosis Date  . ADHD (attention deficit hyperactivity disorder)   . Diabetes mellitus     Type 2  . Obesity   . PCOS (polycystic ovarian syndrome)   . Elevated blood pressure   . Asthma    Past Surgical History  Procedure Laterality Date  . External ear surgery      ear tubes   Family History  Problem Relation Age of Onset  . Diabetes Mother   . Vision loss Mother   . ADD / ADHD Brother   . Allergies Brother   . Asthma Brother   . Vision loss Brother   . Cancer Paternal Grandfather     Colon Cancer  . Heart disease Father 47    died from MI at age 81   History  Substance Use Topics  . Smoking status: Current Every Day Smoker    Types: Cigarettes  .  Smokeless tobacco: Not on file  . Alcohol Use: Yes   OB History    No data available     Review of Systems  Constitutional: Negative for fever and chills.  Eyes: Negative for visual disturbance.  Musculoskeletal: Negative for neck pain and neck stiffness.  Skin: Positive for rash.      Allergies  Amoxicillin; Amoxicillin-pot clavulanate; Cephalosporins; Eryzole; XID:HWYSHUOH; and Penicillins  Home Medications   Prior to Admission medications   Medication Sig Start Date End Date Taking? Authorizing Provider  albuterol (PROVENTIL HFA;VENTOLIN HFA) 108 (90 BASE) MCG/ACT inhaler Inhale 2 puffs into the lungs every 4 (four) hours as needed for wheezing. 11/16/13   Lamarr Lulas, MD  albuterol (PROVENTIL) (2.5 MG/3ML) 0.083% nebulizer solution Take 3 mLs (2.5 mg total) by nebulization every 4 (four) hours as needed for wheezing or shortness of breath. 11/16/13   Lamarr Lulas, MD  clindamycin (CLEOCIN) 150 MG capsule Take 2 capsules (300 mg total) by mouth 3 (three) times daily. 06/22/14   Nicolo Tomko, PA-C  ibuprofen (ADVIL,MOTRIN) 200 MG tablet Take 600 mg by mouth every 6 (six) hours as needed.    Historical Provider, MD  insulin glargine (LANTUS) 100 UNIT/ML injection Inject 25 Units into the skin at bedtime.    Historical Provider, MD  Insulin Pen Needle 31G X 5 MM MISC Use 1 time daily as instructed. 04/12/13   Philemon Kingdom, MD  metFORMIN (GLUCOPHAGE) 500 MG tablet Take 1 tablet (500 mg total) by mouth 2 (two) times daily with a meal. 12/29/13   Andree Coss, MD  naproxen sodium (ANAPROX) 220 MG tablet Take 1 tablet (220 mg total) by mouth 2 (two) times daily as needed. 11/21/13   Leigh-Anne Cioffredi, MD  omeprazole (PRILOSEC) 20 MG capsule Take 1 capsule (20 mg total) by mouth daily. 11/21/13   Leigh-Anne Cioffredi, MD   BP 132/85 mmHg  Pulse 88  Temp(Src) 97.6 F (36.4 C) (Oral)  Resp 16  Ht 5' 4.5" (1.638 m)  Wt 230 lb (104.327 kg)  BMI 38.88 kg/m2  SpO2  99%  LMP 06/19/2014 Physical Exam  Constitutional: She is oriented to person, place, and time. She appears well-developed and well-nourished. No distress.  HENT:  Head: Normocephalic and atraumatic.    Mild swelling and tenderness upon palpation to the occipital region of the scalp with negative erythema, active drainage, fluctuance or induration noted upon palpation.  Eyes: Conjunctivae and EOM are normal. Pupils are equal, round, and reactive to light. Right eye exhibits no discharge. Left eye exhibits no discharge.  Neck: Normal range of motion. Neck supple.  Cardiovascular: Normal rate, regular rhythm and normal heart sounds.  Exam reveals no friction rub.   No murmur heard. Pulmonary/Chest: Effort normal and breath sounds normal. No respiratory distress. She has no wheezes. She has no rales.  Musculoskeletal: Normal range of motion.  Neurological: She is alert and oriented to person, place, and time. No cranial nerve deficit. She exhibits normal muscle tone. Coordination normal.  Skin: Skin is warm and dry. No rash noted. She is not diaphoretic. No erythema.  Psychiatric: She has a normal mood and affect. Her behavior is normal. Thought content normal.  Nursing note and vitals reviewed.   ED Course  Procedures (including critical care time)  DIAGNOSTIC STUDIES: Oxygen Saturation is 97% on room air, normal by my interpretation.    COORDINATION OF CARE: 2:47 PM - Discussed treatment plan with pt at bedside which includes antibiotic medication, and pt agreed to plan.   Results for orders placed or performed during the hospital encounter of 81/01/75  Basic metabolic panel  Result Value Ref Range   Sodium 135 135 - 145 mmol/L   Potassium 4.1 3.5 - 5.1 mmol/L   Chloride 102 96 - 112 mmol/L   CO2 24 19 - 32 mmol/L   Glucose, Bld 353 (H) 70 - 99 mg/dL   BUN 6 6 - 23 mg/dL   Creatinine, Ser 0.56 0.50 - 1.10 mg/dL   Calcium 8.9 8.4 - 10.5 mg/dL   GFR calc non Af Amer >90 >90  mL/min   GFR calc Af Amer >90 >90 mL/min   Anion gap 9 5 - 15  Ketones, qualitative  Result Value Ref Range   Acetone, Bld NEGATIVE NEGATIVE  POC CBG, ED  Result Value Ref Range   Glucose-Capillary 348 (H) 70 - 99 mg/dL  POC urine preg, ED (not at [facility])  Result Value Ref Range   Preg Test, Ur NEGATIVE NEGATIVE    Labs Review Labs Reviewed  BASIC METABOLIC PANEL - Abnormal; Notable for the following:    Glucose, Bld 353 (*)    All other components within normal limits  CBG MONITORING, ED - Abnormal; Notable for the following:    Glucose-Capillary 348 (*)  All other components within normal limits  KETONES, QUALITATIVE  POC URINE PREG, ED    Imaging Review No results found.   EKG Interpretation None      MDM   Final diagnoses:  Scalp irritation  Medically noncompliant  Hyperglycemia   Medications - No data to display  Filed Vitals:   06/22/14 1357 06/22/14 1620  BP: 133/73 132/85  Pulse: 90 88  Temp: 97.8 F (36.6 C) 97.6 F (36.4 C)  TempSrc: Oral   Resp: 18 16  Height: 5' 4.5" (1.638 m)   Weight: 230 lb (104.327 kg)   SpO2: 97% 99%   I personally performed the services described in this documentation, which was scribed in my presence. The recorded information has been reviewed and is accurate.  CBG 348. BMP noted anion gap of 9.0 mEq/L-negative elevation. Ketones negative. Urine pregnancy negative. Patient presenting to emergency department with discomfort upon palpation to posterior aspect of the head that started 2 weeks ago. Patient reported that she got her hair braided approximately 2 weeks ago-stated that she noticed a bump shortly afterwards to the bronchoscopic larger and tender upon palpation. Denied active drainage or bleeding. Patient has history of diabetes but has not been taking her insulin or diabetic medications for approximately one year-when asked why the patient reported that "it hurts." Stated that her glucose levels have always been  between the 300s and 400s ever since she was diagnosed with diabetes-reported that in the physicians had a difficult time controlling her glucose levels. This provider had a long discussion with the patient regarding consequences of diabetes is not properly controlled-patient understood. Doubt DKA. Patient has high glucose of 353 - this is probably patient's baseline secondary to history. Negative findings of drainable abscess. Definitive etiology of discomfort in the occipital portion of the scalp unknown-can be possible irritation secondary to hair braiding versus possible beginnings of infection. Patient placed on antibiotics for prophylactic treatment. Patient stable, afebrile. Patient not septic appearing. Discharged patient. Discharge patient with antibiotics. Discussed with patient to follow-up with her primary care provider. Reiterated importance of diabetes and controlling glucose-patient understood. Discussed with patient to closely monitor symptoms and if symptoms are to worsen or change to report back to the ED - strict return instructions given.  Patient agreed to plan of care, understood, all questions answered.   Jamse Mead, PA-C 06/22/14 [place] Alvino Chapel, MD 06/23/14 0737

## 2014-08-27 ENCOUNTER — Emergency Department (HOSPITAL_COMMUNITY)
Admission: EM | Admit: 2014-08-27 | Discharge: 2014-08-27 | Disposition: A | Discharge status: Home / Self Care | Payer: Medicaid Other | Attending: Emergency Medicine | Admitting: Emergency Medicine

## 2014-08-27 ENCOUNTER — Encounter (HOSPITAL_COMMUNITY): Payer: Self-pay

## 2014-08-27 ENCOUNTER — Emergency Department (HOSPITAL_COMMUNITY): Payer: Medicaid Other

## 2014-08-27 DIAGNOSIS — E669 Obesity, unspecified: Secondary | ICD-10-CM | POA: Diagnosis not present

## 2014-08-27 DIAGNOSIS — Z8659 Personal history of other mental and behavioral disorders: Secondary | ICD-10-CM | POA: Insufficient documentation

## 2014-08-27 DIAGNOSIS — Z792 Long term (current) use of antibiotics: Secondary | ICD-10-CM | POA: Insufficient documentation

## 2014-08-27 DIAGNOSIS — Z79899 Other long term (current) drug therapy: Secondary | ICD-10-CM | POA: Insufficient documentation

## 2014-08-27 DIAGNOSIS — E282 Polycystic ovarian syndrome: Secondary | ICD-10-CM | POA: Insufficient documentation

## 2014-08-27 DIAGNOSIS — J45909 Unspecified asthma, uncomplicated: Secondary | ICD-10-CM | POA: Diagnosis not present

## 2014-08-27 DIAGNOSIS — Z880 Allergy status to penicillin: Secondary | ICD-10-CM | POA: Insufficient documentation

## 2014-08-27 DIAGNOSIS — Z720 Tobacco use: Secondary | ICD-10-CM | POA: Insufficient documentation

## 2014-08-27 DIAGNOSIS — Z794 Long term (current) use of insulin: Secondary | ICD-10-CM | POA: Diagnosis not present

## 2014-08-27 DIAGNOSIS — E1065 Type 1 diabetes mellitus with hyperglycemia: Secondary | ICD-10-CM | POA: Insufficient documentation

## 2014-08-27 DIAGNOSIS — R1011 Right upper quadrant pain: Secondary | ICD-10-CM | POA: Diagnosis present

## 2014-08-27 LAB — CBC WITH DIFFERENTIAL/PLATELET
BASOS PCT: 0 % (ref 0–1)
Basophils Absolute: 0 10*3/uL (ref 0.0–0.1)
Eosinophils Absolute: 0.2 10*3/uL (ref 0.0–0.7)
Eosinophils Relative: 1 % (ref 0–5)
HEMATOCRIT: 38.4 % (ref 36.0–46.0)
Hemoglobin: 12.6 g/dL (ref 12.0–15.0)
LYMPHS ABS: 3.4 10*3/uL (ref 0.7–4.0)
Lymphocytes Relative: 31 % (ref 12–46)
MCH: 26 pg (ref 26.0–34.0)
MCHC: 32.8 g/dL (ref 30.0–36.0)
MCV: 79.3 fL (ref 78.0–100.0)
Monocytes Absolute: 0.4 10*3/uL (ref 0.1–1.0)
Monocytes Relative: 4 % (ref 3–12)
NEUTROS ABS: 6.9 10*3/uL (ref 1.7–7.7)
NEUTROS PCT: 64 % (ref 43–77)
PLATELETS: 504 10*3/uL — AB (ref 150–400)
RBC: 4.84 MIL/uL (ref 3.87–5.11)
RDW: 14.5 % (ref 11.5–15.5)
WBC: 10.9 10*3/uL — ABNORMAL HIGH (ref 4.0–10.5)

## 2014-08-27 LAB — COMPREHENSIVE METABOLIC PANEL
ALBUMIN: 3.8 g/dL (ref 3.5–5.2)
ALK PHOS: 83 U/L (ref 39–117)
ALT: 12 U/L (ref 0–35)
AST: 15 U/L (ref 0–37)
Anion gap: 11 (ref 5–15)
BUN: 5 mg/dL — ABNORMAL LOW (ref 6–23)
CALCIUM: 9.1 mg/dL (ref 8.4–10.5)
CO2: 25 mmol/L (ref 19–32)
Chloride: 100 mmol/L (ref 96–112)
Creatinine, Ser: 0.53 mg/dL (ref 0.50–1.10)
GFR calc Af Amer: >90 mL/min (ref >90)
Glucose, Bld: 352 mg/dL — ABNORMAL HIGH (ref 70–99)
POTASSIUM: 3.9 mmol/L (ref 3.5–5.1)
Sodium: 136 mmol/L (ref 135–145)
TOTAL PROTEIN: 7.1 g/dL (ref 6.0–8.3)
Total Bilirubin: 0.8 mg/dL (ref 0.3–1.2)

## 2014-08-27 LAB — LIPASE, BLOOD: Lipase: 21 U/L (ref 11–59)

## 2014-08-27 LAB — CBG MONITORING, ED: GLUCOSE-CAPILLARY: 353 mg/dL — AB (ref 70–99)

## 2014-08-27 MED ORDER — FENTANYL CITRATE 0.05 MG/ML IJ SOLN
50.0000 ug | Freq: Once | INTRAMUSCULAR | Status: AC
  Administered 2014-08-27: 50 ug via INTRAVENOUS
  Filled 2014-08-27: qty 2

## 2014-08-27 MED ORDER — ACETAMINOPHEN 160 MG/5ML PO SOLN
650.0000 mg | Freq: Once | ORAL | Status: AC
  Administered 2014-08-27: 650 mg via ORAL
  Filled 2014-08-27: qty 20.3

## 2014-08-27 NOTE — ED Notes (Signed)
Pt reports RUQ pain since yesterday progressively worsening. Worse with palpation and movement. Denies N/V/D. Pt also hx diabetes, reports not taking insulin x 1 month. States "I just haven't done it." NAD.

## 2014-08-27 NOTE — Discharge Instructions (Signed)

## 2014-08-27 NOTE — ED Provider Notes (Signed)
CSN: [id number]     Arrival date & time 08/27/14  1115 History   First MD Initiated Contact with Patient 08/27/14 1140     Chief Complaint  Patient presents with  . Abdominal Pain     (Consider location/radiation/quality/duration/timing/severity/associated sxs/prior Treatment) Patient is a 19 y.o. female presenting with abdominal pain. The history is provided by the patient. No language interpreter was used.  Abdominal Pain Associated symptoms: no constipation   Mercedes Valdez is a 19 y.o black female with a history of type 1 DM who presents with RUQ abdominal pain that began yesterday afternoon.  She says the pain has worsened today.  She ate chinese food prior to arrival which made the pain worse.  The pain does not radiate.  She rates it at an 8/10. She did take an ibuprofen 250mg  today with no relief.  She has a normal bowel movement this morning and is able to pass gas.  She denies fever, alcohol use, nausea, vomiting, dysuria.  She is on her menstrual cycle now.   Past Medical History  Diagnosis Date  . ADHD (attention deficit hyperactivity disorder)   . Diabetes mellitus     Type 2  . Obesity   . PCOS (polycystic ovarian syndrome)   . Elevated blood pressure   . Asthma    Past Surgical History  Procedure Laterality Date  . External ear surgery      ear tubes   Family History  Problem Relation Age of Onset  . Diabetes Mother   . Vision loss Mother   . ADD / ADHD Brother   . Allergies Brother   . Asthma Brother   . Vision loss Brother   . Cancer Paternal Grandfather     Colon Cancer  . Heart disease Father 43    died from MI at age 42   History  Substance Use Topics  . Smoking status: Current Every Day Smoker -- 0.20 packs/day    Types: Cigarettes  . Smokeless tobacco: Not on file  . Alcohol Use: Yes   OB History    No data available     Review of Systems  Gastrointestinal: Positive for abdominal pain. Negative for constipation, blood in stool and abdominal  distention.  All other systems reviewed and are negative.     Allergies  Amoxicillin; Amoxicillin-pot clavulanate; Cephalosporins; Eryzole; DDU:[id number]; and Penicillins  Home Medications   Prior to Admission medications   Medication Sig Start Date End Date Taking? Authorizing Provider  albuterol (PROVENTIL HFA;VENTOLIN HFA) 108 (90 BASE) MCG/ACT inhaler Inhale 2 puffs into the lungs every 4 (four) hours as needed for wheezing. 11/16/13   Karlene Einstein, MD  albuterol (PROVENTIL) (2.5 MG/3ML) 0.083% nebulizer solution Take 3 mLs (2.5 mg total) by nebulization every 4 (four) hours as needed for wheezing or shortness of breath. 11/16/13   Karlene Einstein, MD  clindamycin (CLEOCIN) 150 MG capsule Take 2 capsules (300 mg total) by mouth 3 (three) times daily. 06/22/14   Marissa Sciacca, PA-C  ibuprofen (ADVIL,MOTRIN) 200 MG tablet Take 600 mg by mouth every 6 (six) hours as needed.    Historical Provider, MD  insulin glargine (LANTUS) 100 UNIT/ML injection Inject 25 Units into the skin at bedtime.    Historical Provider, MD  Insulin Pen Needle 31G X 5 MM MISC Use 1 time daily as instructed. 04/12/13   Philemon Kingdom, MD  metFORMIN (GLUCOPHAGE) 500 MG tablet Take 1 tablet (500 mg total) by mouth 2 (two) times daily with a meal.  12/29/13   Gaspar Skeeters, MD  naproxen sodium (ANAPROX) 220 MG tablet Take 1 tablet (220 mg total) by mouth 2 (two) times daily as needed. 11/21/13   Valdez-Anne Cioffredi, MD  omeprazole (PRILOSEC) 20 MG capsule Take 1 capsule (20 mg total) by mouth daily. 11/21/13   Valdez-Anne Cioffredi, MD   BP 130/83 mmHg  Pulse 99  Temp(Src) 98.2 F (36.8 C) (Oral)  Resp 17  Ht 5' 4.5" (1.638 m)  Wt 229 lb 8 oz (104.101 kg)  BMI 38.80 kg/m2  SpO2 100%  LMP 08/27/2014 Physical Exam  Constitutional: She is oriented to person, place, and time. She appears well-developed and well-nourished.  HENT:  Head: Normocephalic and atraumatic.  Eyes: Conjunctivae are normal.  Neck: Normal  range of motion. Neck supple.  Cardiovascular: Normal rate, regular rhythm and normal heart sounds.   Pulmonary/Chest: Effort normal and breath sounds normal.  Abdominal: Soft. Normal appearance. She exhibits no distension and no mass. There is tenderness in the right upper quadrant. There is positive Murphy's sign. There is no CVA tenderness.  Musculoskeletal: Normal range of motion.  Neurological: She is alert and oriented to person, place, and time.  Skin: Skin is warm and dry.  Nursing note and vitals reviewed.   ED Course  Procedures (including critical care time) Labs Review Labs Reviewed  CBC WITH DIFFERENTIAL/PLATELET - Abnormal; Notable for the following:    WBC 10.9 (*)    Platelets 504 (*)    All other components within normal limits  COMPREHENSIVE METABOLIC PANEL - Abnormal; Notable for the following:    Glucose, Bld 352 (*)    BUN 5 (*)    All other components within normal limits  CBG MONITORING, ED - Abnormal; Notable for the following:    Glucose-Capillary 353 (*)    All other components within normal limits  LIPASE, BLOOD    Imaging Review US Abdomen Limited Ruq  08/27/2014   CLINICAL DATA:  Right upper quadrant abdominal pain for 1 day. Diabetes.  EXAM: US ABDOMEN LIMITED - RIGHT UPPER QUADRANT  COMPARISON:  None.  FINDINGS: Gallbladder:  No gallstones observed. Contracted gallbladder. No sonographic Murphy sign noted.  Common bile duct:  Diameter: 3 mm  Liver:  No focal lesion identified. Within normal limits in parenchymal echogenicity.  IMPRESSION: 1. Aside from a contracted appearance of the gallbladder, the hepatobiliary system appears normal.   Electronically Signed   By: Van Clines M.D.   On: 08/27/2014 14:35     EKG Interpretation None      MDM   Final diagnoses:  RUQ abdominal pain  Hyperglycemia due to type 1 diabetes mellitus  Patient has DM type 1 presents for RUQ abdominal pain.  Positive Murphy's sign on exam. No fever, nausea,  vomiting, or diarrhea. No history of cholecystitis or gallstones.  No previous abdominal surgeries.  I do not suspect pneumonia since pain is reproducible with palpation.  14:09 Patient is going to [place]. Labs and [place] are not suggestive of gallstones or cholecystitis. No hepatobiliary system abnormalities.  She is afebrile and vitals are stable. No vomiting in ED.  Her liver and pancreatic enzymes are normal.    I spoke to mom and patient about glucose of 352.  Patient states she stopped taking her metformin and insulin last year because she was injecting herself and it was painful.  She says her glucose usually runs in the 500's without problems.  Her last A1C was 14.  I thoroughly explained the importance  of taking the medications and f/u with her pcp regarding her DM and abdominal pain.  I suggested eating a bland diet for a couple of days and taking tylenol or motrin for pain.      Mercedes Glazier, PA-C 08/28/14 1046  Mercedes Leigh, MD 09/01/14 [phone]

## 2014-09-04 ENCOUNTER — Encounter (HOSPITAL_COMMUNITY): Payer: Self-pay

## 2014-09-04 ENCOUNTER — Emergency Department (HOSPITAL_COMMUNITY)
Admission: EM | Admit: 2014-09-04 | Discharge: 2014-09-04 | Disposition: A | Discharge status: Home / Self Care | Payer: Medicaid Other | Attending: Emergency Medicine | Admitting: Emergency Medicine

## 2014-09-04 DIAGNOSIS — B009 Herpesviral infection, unspecified: Secondary | ICD-10-CM | POA: Diagnosis not present

## 2014-09-04 DIAGNOSIS — R102 Pelvic and perineal pain: Secondary | ICD-10-CM | POA: Diagnosis present

## 2014-09-04 DIAGNOSIS — Z8659 Personal history of other mental and behavioral disorders: Secondary | ICD-10-CM | POA: Insufficient documentation

## 2014-09-04 DIAGNOSIS — R739 Hyperglycemia, unspecified: Secondary | ICD-10-CM

## 2014-09-04 DIAGNOSIS — N898 Other specified noninflammatory disorders of vagina: Secondary | ICD-10-CM | POA: Insufficient documentation

## 2014-09-04 DIAGNOSIS — Z720 Tobacco use: Secondary | ICD-10-CM | POA: Diagnosis not present

## 2014-09-04 DIAGNOSIS — E1165 Type 2 diabetes mellitus with hyperglycemia: Secondary | ICD-10-CM | POA: Diagnosis not present

## 2014-09-04 DIAGNOSIS — Z794 Long term (current) use of insulin: Secondary | ICD-10-CM | POA: Insufficient documentation

## 2014-09-04 DIAGNOSIS — Z3202 Encounter for pregnancy test, result negative: Secondary | ICD-10-CM | POA: Diagnosis not present

## 2014-09-04 DIAGNOSIS — G479 Sleep disorder, unspecified: Secondary | ICD-10-CM | POA: Insufficient documentation

## 2014-09-04 DIAGNOSIS — Z792 Long term (current) use of antibiotics: Secondary | ICD-10-CM | POA: Diagnosis not present

## 2014-09-04 DIAGNOSIS — Z880 Allergy status to penicillin: Secondary | ICD-10-CM | POA: Insufficient documentation

## 2014-09-04 DIAGNOSIS — Z791 Long term (current) use of non-steroidal anti-inflammatories (NSAID): Secondary | ICD-10-CM | POA: Diagnosis not present

## 2014-09-04 DIAGNOSIS — E669 Obesity, unspecified: Secondary | ICD-10-CM | POA: Insufficient documentation

## 2014-09-04 DIAGNOSIS — J45909 Unspecified asthma, uncomplicated: Secondary | ICD-10-CM | POA: Insufficient documentation

## 2014-09-04 DIAGNOSIS — Z79899 Other long term (current) drug therapy: Secondary | ICD-10-CM | POA: Diagnosis not present

## 2014-09-04 LAB — I-STAT CHEM 8, ED
BUN: 7 mg/dL (ref 6–23)
Calcium, Ion: 1.16 mmol/L (ref 1.12–1.23)
Chloride: 96 mmol/L (ref 96–112)
Creatinine, Ser: 0.5 mg/dL (ref 0.50–1.10)
GLUCOSE: 530 mg/dL — AB (ref 70–99)
HEMATOCRIT: 43 % (ref 36.0–46.0)
Hemoglobin: 14.6 g/dL (ref 12.0–15.0)
POTASSIUM: 4.2 mmol/L (ref 3.5–5.1)
SODIUM: 132 mmol/L — AB (ref 135–145)
TCO2: 19 mmol/L (ref 0–100)

## 2014-09-04 LAB — CBC WITH DIFFERENTIAL/PLATELET
Basophils Absolute: 0 10*3/uL (ref 0.0–0.1)
Basophils Relative: 0 % (ref 0–1)
EOS PCT: 1 % (ref 0–5)
Eosinophils Absolute: 0.1 10*3/uL (ref 0.0–0.7)
HCT: 40.3 % (ref 36.0–46.0)
Hemoglobin: 13.4 g/dL (ref 12.0–15.0)
Lymphocytes Relative: 27 % (ref 12–46)
Lymphs Abs: 3.2 10*3/uL (ref 0.7–4.0)
MCH: 26.5 pg (ref 26.0–34.0)
MCHC: 33.3 g/dL (ref 30.0–36.0)
MCV: 79.8 fL (ref 78.0–100.0)
MONOS PCT: 5 % (ref 3–12)
Monocytes Absolute: 0.6 10*3/uL (ref 0.1–1.0)
NEUTROS PCT: 67 % (ref 43–77)
Neutro Abs: 7.9 10*3/uL — ABNORMAL HIGH (ref 1.7–7.7)
Platelets: 496 10*3/uL — ABNORMAL HIGH (ref 150–400)
RBC: 5.05 MIL/uL (ref 3.87–5.11)
RDW: 14.2 % (ref 11.5–15.5)
WBC: 11.8 10*3/uL — AB (ref 4.0–10.5)

## 2014-09-04 LAB — WET PREP, GENITAL
CLUE CELLS WET PREP: NONE SEEN
Trich, Wet Prep: NONE SEEN
Yeast Wet Prep HPF POC: NONE SEEN

## 2014-09-04 LAB — CBG MONITORING, ED
GLUCOSE-CAPILLARY: 302 mg/dL — AB (ref 70–99)
Glucose-Capillary: 401 mg/dL — ABNORMAL HIGH (ref 70–99)
Glucose-Capillary: 236 mg/dL — ABNORMAL HIGH (ref 70–99)
Glucose-Capillary: 265 mg/dL — ABNORMAL HIGH (ref 70–99)

## 2014-09-04 LAB — POC URINE PREG, ED: Preg Test, Ur: NEGATIVE

## 2014-09-04 MED ORDER — LIDOCAINE HCL 2 % EX GEL
1.0000 "application " | Freq: Once | CUTANEOUS | Status: AC
  Administered 2014-09-04: 1 via TOPICAL
  Filled 2014-09-04: qty 20

## 2014-09-04 MED ORDER — ACYCLOVIR 400 MG PO TABS: 800.0000 mg | ORAL_TABLET | Freq: Every day | ORAL | Status: DC

## 2014-09-04 MED ORDER — DOXYCYCLINE HYCLATE 100 MG PO CAPS: 100.0000 mg | ORAL_CAPSULE | Freq: Two times a day (BID) | ORAL | Status: DC

## 2014-09-04 MED ORDER — FLUCONAZOLE 150 MG PO TABS
150.0000 mg | ORAL_TABLET | Freq: Once | ORAL | Status: DC
Start: 2014-09-04 — End: 2014-09-13

## 2014-09-04 MED ORDER — DEXTROSE 50 % IV SOLN: 25.0000 mL | INTRAVENOUS | Status: DC | PRN

## 2014-09-04 MED ORDER — FLUCONAZOLE 100 MG PO TABS
150.0000 mg | ORAL_TABLET | Freq: Once | ORAL | Status: AC
  Administered 2014-09-04: 150 mg via ORAL
  Filled 2014-09-04: qty 1.5

## 2014-09-04 MED ORDER — DOXYCYCLINE HYCLATE 100 MG PO TABS
100.0000 mg | ORAL_TABLET | Freq: Once | ORAL | Status: AC
  Administered 2014-09-04: 100 mg via ORAL
  Filled 2014-09-04: qty 1

## 2014-09-04 MED ORDER — SODIUM CHLORIDE 0.9 % IV SOLN
INTRAVENOUS | Status: DC
  Administered 2014-09-04: 3.4 [IU]/h via INTRAVENOUS
  Filled 2014-09-04: qty 2.5

## 2014-09-04 MED ORDER — SODIUM CHLORIDE 0.9 % IV SOLN
INTRAVENOUS | Status: DC
Start: 2014-09-04 — End: 2014-09-04
  Administered 2014-09-04: 150 mL/h via INTRAVENOUS

## 2014-09-04 MED ORDER — SODIUM CHLORIDE 0.9 % IV BOLUS (SEPSIS)
1000.0000 mL | Freq: Once | INTRAVENOUS | Status: AC
  Administered 2014-09-04: 1000 mL via INTRAVENOUS

## 2014-09-04 MED ORDER — INSULIN REGULAR BOLUS VIA INFUSION
0.0000 [IU] | Freq: Three times a day (TID) | INTRAVENOUS | Status: DC
  Filled 2014-09-04: qty 10

## 2014-09-04 MED ORDER — DEXTROSE-NACL 5-0.45 % IV SOLN: INTRAVENOUS | Status: DC

## 2014-09-04 NOTE — ED Notes (Signed)
CBG 236. 

## 2014-09-04 NOTE — ED Notes (Signed)
Patient arrived in room. Mother at the bedside. Margreta Journey, RN to attempt [place] IV. Nurses in [place] attempted x4.

## 2014-09-04 NOTE — ED Notes (Signed)
Called charge to get help with IV.   Charge advised to move patient to B14 and they will get a line.

## 2014-09-04 NOTE — ED Notes (Signed)
Phlebotomy at the bedside  

## 2014-09-04 NOTE — ED Notes (Signed)
Called Pharmacy about sending medication. Dose not in pyxis.

## 2014-09-04 NOTE — ED Notes (Signed)
Pt. Reports vaginal pain starting 2 days ago with white discharge and itching. Reports sexually active with use of protection. Denies abdominal pain/N/V.

## 2014-09-04 NOTE — ED Provider Notes (Signed)
CSN: [id number]     Arrival date & time 09/04/14  1142 History   First MD Initiated Contact with Patient 09/04/14 1306     Chief Complaint  Patient presents with  . Vaginal Pain     (Consider location/radiation/quality/duration/timing/severity/associated sxs/prior Treatment) HPI   Pt with hx untreated type 1 DM p/w vaginal pain, vaginal lesions, yellow vaginal discharge that began 3 days ago.  She is sexually active with one female partner, does not use protection 100% of the time.  Denies fevers, chills, abdominal pain, urinary frequency or urgency, abnormal bowel movements or bowel changes.  LMP 08/27/14  Pt seen in ED last week for abdominal pain, uncontrolled blood glucose.  Pt is not taking any of her diabetes medications.  Has a PCP appointment tomorrow afternoon.    Past Medical History  Diagnosis Date  . ADHD (attention deficit hyperactivity disorder)   . Diabetes mellitus     Type 2  . Obesity   . PCOS (polycystic ovarian syndrome)   . Elevated blood pressure   . Asthma    Past Surgical History  Procedure Laterality Date  . External ear surgery      ear tubes   Family History  Problem Relation Age of Onset  . Diabetes Mother   . Vision loss Mother   . ADD / ADHD Brother   . Allergies Brother   . Asthma Brother   . Vision loss Brother   . Cancer Paternal Grandfather     Colon Cancer  . Heart disease Father 25    died from MI at age 12   History  Substance Use Topics  . Smoking status: Current Every Day Smoker -- 0.20 packs/day    Types: Cigarettes  . Smokeless tobacco: Not on file  . Alcohol Use: Yes   OB History    No data available     Review of Systems  Constitutional: Negative for fever.  Cardiovascular: Negative for leg swelling.  Gastrointestinal: Negative for nausea, vomiting, abdominal pain and diarrhea.  Genitourinary: Negative for dysuria, urgency, frequency, vaginal bleeding and vaginal discharge.  Musculoskeletal: Negative for back pain.   Skin: Positive for rash.  Allergic/Immunologic: Positive for immunocompromised state.  Psychiatric/Behavioral: Positive for sleep disturbance (unable to sleep due to vaginal pain).      Allergies  Amoxicillin; Amoxicillin-pot clavulanate; Cephalosporins; Eryzole; PNT:[id number]; and Penicillins  Home Medications   Prior to Admission medications   Medication Sig Start Date End Date Taking? Authorizing Provider  albuterol (PROVENTIL HFA;VENTOLIN HFA) 108 (90 BASE) MCG/ACT inhaler Inhale 2 puffs into the lungs every 4 (four) hours as needed for wheezing. 11/16/13  Yes Karlene Einstein, MD  albuterol (PROVENTIL) (2.5 MG/3ML) 0.083% nebulizer solution Take 3 mLs (2.5 mg total) by nebulization every 4 (four) hours as needed for wheezing or shortness of breath. 11/16/13  Yes Karlene Einstein, MD  ibuprofen (ADVIL,MOTRIN) 200 MG tablet Take 600 mg by mouth every 6 (six) hours as needed for mild pain or moderate pain.    Yes Historical Provider, MD  naproxen sodium (ANAPROX) 220 MG tablet Take 1 tablet (220 mg total) by mouth 2 (two) times daily as needed. Patient taking differently: Take 220 mg by mouth 2 (two) times daily as needed (headaches).  11/21/13  Yes Leigh-Anne Cioffredi, MD  clindamycin (CLEOCIN) 150 MG capsule Take 2 capsules (300 mg total) by mouth 3 (three) times daily. Patient not taking: Reported on 09/04/2014 06/22/14   Marissa Sciacca, PA-C  Insulin Pen Needle 31G X 5 MM  MISC Use 1 time daily as instructed. Patient not taking: Reported on 09/04/2014 04/12/13   Philemon Kingdom, MD  metFORMIN (GLUCOPHAGE) 500 MG tablet Take 1 tablet (500 mg total) by mouth 2 (two) times daily with a meal. Patient not taking: Reported on 09/04/2014 12/29/13   Gaspar Skeeters, MD  omeprazole (PRILOSEC) 20 MG capsule Take 1 capsule (20 mg total) by mouth daily. Patient not taking: Reported on 09/04/2014 11/21/13   Leigh-Anne Cioffredi, MD   BP 165/107 mmHg  Pulse 120  Temp(Src) 98.4 F (36.9 C) (Oral)  Resp 20   SpO2 95%  LMP 08/27/2014 Physical Exam  Constitutional: She appears well-developed and well-nourished. No distress.  HENT:  Head: Normocephalic and atraumatic.  Neck: Neck supple.  Pulmonary/Chest: Effort normal.  Abdominal: Soft. She exhibits no distension. There is no tenderness. There is no rebound and no guarding.  Genitourinary: Right adnexum displays no mass and no tenderness. Left adnexum displays no mass and no tenderness. There is tenderness in the vagina. No bleeding in the vagina. Vaginal discharge found.  Neurological: She is alert.  Skin: She is not diaphoretic.  Nursing note and vitals reviewed.   ED Course  Procedures (including critical care time) Labs Review Labs Reviewed  I-STAT CHEM 8, ED - Abnormal; Notable for the following:    Sodium 132 (*)    Glucose, Bld 530 (*)    All other components within normal limits  HERPES SIMPLEX VIRUS CULTURE  WET PREP, GENITAL  HIV ANTIBODY (ROUTINE TESTING)  RPR  CBC WITH DIFFERENTIAL/PLATELET  HSV 1 ANTIBODY, IGG  HSV 2 ANTIBODY, IGG  POC URINE PREG, ED  GC/CHLAMYDIA PROBE AMP ([place])    Imaging Review No results found.   EKG Interpretation None      MDM   Final diagnoses:  Hyperglycemia    Afebrile nontoxic patient who presents with abnormal vaginal discharge and vaginal lesions.  Pt seen initially in [facility].  Glucose found to be elevated at 530.  Pt moved to main ED with IVF and glucose stabilizer protocol ordered.  Pelvic exam significant for multiple ulcerated and pustular lesions over bilateral labia, inside and out, also with large amount of white/yellow vaginal discharge.  No cervical motion tenderness or abdominal/midline/adnexal tenderness concerning for PID or TOA.  Lidocaine gel placed over lesions for pain relief.  Wet prep remarkable only for WBC.  Will treat empirically for yeast.   HIV/STD tests including HSV pending.   Signed out to [facility] as patient moved to main ED.  Plan is for  blood glucose reduction in ED if patient is will to wait for it (I have spoken with patient and her mother at length about the importance of this).  Treating empirically for yeast in ED.   PCP follow up tomorrow.     Clayton Bibles, PA-C 09/04/14 [place], MD 09/04/14 [phone]

## 2014-09-04 NOTE — ED Provider Notes (Signed)
Care assumed from [facility] [place].  Mercedes Valdez is a 19 y.o. female presents with complaints of vaginal pain and vaginal lesions. After evaluation by Clayton Bibles patient found to be hyperglycemic with a history of insulin-dependent type 1 diabetes, largely noncompliant.  She reports she is not taking any of her diabetes medications.    Vaginal exam with copious amounts of foul discharge and some consistent with [place]. Patient will be treated with Diflucan here in the emergency department. Will await wet prep.  No cervical motion tenderness.  Physical Exam  BP 135/90 mmHg  Pulse 106  Temp(Src) 98.4 F (36.9 C) (Oral)  Resp 20  SpO2 99%  LMP 08/27/2014  Physical Exam   Face to face Exam:   General: Awake  HEENT: Atraumatic  Resp: Normal effort  Abd: Nondistended, soft and nontender  Neuro:No focal weakness  Lymph: No adenopathy    ED Course  Procedures  MDM Mercedes Valdez is a noncompliant diabetic who presents with vaginal discharge. On exam patient was found to have herpetic lesions and discharge consistent with yeast and questionable STD.  Patient placed on the glucose stabilizer by previous provider while here in the emergency department.  Plan: Blood glucose reduction in ED.  Pt to be treated for herpes, STD and yeast.    6:10 PM Patient with blood sugar at 265. Patient and mother wish for discharge home at this time. They do not wish to stay any longer. Patient remains slightly tachycardic with heart rate of 108. Patient treated with Diflucan and doxycycline here in the emergency department.  [facility] testing in process. 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 She is allergic to cephalosporins, erythromycin and penicillin. Patient will be treated with doxycycline to cover potential STDs.  Patient has an appointment with her primary care provider tomorrow morning. Mother reports she will ensure the patient goes for further discussion of her diabetes.  She tolerating by mouth here  in the department.  Return precautions discussed.  BP 153/96 mmHg  Pulse 111  Temp(Src) 98.4 F (36.9 C) (Oral)  Resp 20  SpO2 100%  LMP 08/27/2014   Abigail Butts, PA-C 09/04/14 1816  Serita Grit, MD 09/04/14 2045

## 2014-09-04 NOTE — ED Notes (Signed)
Pelvic setup at bedside PA informed

## 2014-09-04 NOTE — Discharge Instructions (Signed)
1. Medications: Diflucan - take in 3 days if symptoms persist, doxycycline - complete entire prescription, acyclovir - take until prescription is complete or lesions have healed, usual home medications 2. Treatment: rest, drink plenty of fluids,  3. Follow Up: Please followup with your primary doctor TOMORROW for further discussion of your diabetes, please follow-up with the women's outpatient clinic for further evaluation of your vaginal discharge, Please return to the ER for high fevers, persistent vomiting, worsening symptoms or other concerns.    Blood Glucose Monitoring Monitoring your blood glucose (also know as blood sugar) helps you to manage your diabetes. It also helps you and your health care provider monitor your diabetes and determine how well your treatment plan is working. WHY SHOULD YOU MONITOR YOUR BLOOD GLUCOSE?  It can help you understand how food, exercise, and medicine affect your blood glucose.  It allows you to know what your blood glucose is at any given moment. You can quickly tell if you are having low blood glucose (hypoglycemia) or high blood glucose (hyperglycemia).  It can help you and your health care provider know how to adjust your medicines.  It can help you understand how to manage an illness or adjust medicine for exercise. WHEN SHOULD YOU TEST? Your health care provider will help you decide how often you should check your blood glucose. This may depend on the type of diabetes you have, your diabetes control, or the types of medicines you are taking. Be sure to write down all of your blood glucose readings so that this information can be reviewed with your health care provider. See below for examples of testing times that your health care provider may suggest. Type 1 Diabetes  Test 4 times a day if you are in good control, using an insulin pump, or perform multiple daily injections.  If your diabetes is not well controlled or if you are sick, you may need to  monitor more often.  It is a good idea to also monitor:  Before and after exercise.  Between meals and 2 hours after a meal.  Occasionally between 2:00 a.m. and 3:00 a.m. Type 2 Diabetes  It can vary with each person, but generally, if you are on insulin, test 4 times a day.  If you take medicines by mouth (orally), test 2 times a day.  If you are on a controlled diet, test once a day.  If your diabetes is not well controlled or if you are sick, you may need to monitor more often. HOW TO MONITOR YOUR BLOOD GLUCOSE Supplies Needed  Blood glucose meter.  Test strips for your meter. Each meter has its own strips. You must use the strips that go with your own meter.  A pricking needle (lancet).  A device that holds the lancet (lancing device).  A journal or log book to write down your results. Procedure  Wash your hands with soap and water. Alcohol is not preferred.  Prick the side of your finger (not the tip) with the lancet.  Gently milk the finger until a small drop of blood appears.  Follow the instructions that come with your meter for inserting the test strip, applying blood to the strip, and using your blood glucose meter. Other Areas to Get Blood for Testing Some meters allow you to use other areas of your body (other than your finger) to test your blood. These areas are called alternative sites. The most common alternative sites are:  The forearm.  The thigh.  The back area of the lower leg.  The palm of the hand. The blood flow in these areas is slower. Therefore, the blood glucose values you get may be delayed, and the numbers are different from what you would get from your fingers. Do not use alternative sites if you think you are having hypoglycemia. Your reading will not be accurate. Always use a finger if you are having hypoglycemia. Also, if you cannot feel your lows (hypoglycemia unawareness), always use your fingers for your blood glucose  checks. ADDITIONAL TIPS FOR GLUCOSE MONITORING  Do not reuse lancets.  Always carry your supplies with you.  All blood glucose meters have a 24-hour "hotline" number to call if you have questions or need help.  Adjust (calibrate) your blood glucose meter with a control solution after finishing a few boxes of strips. BLOOD GLUCOSE RECORD KEEPING It is a good idea to keep a daily record or log of your blood glucose readings. Most glucose meters, if not all, keep your glucose records stored in the meter. Some meters come with the ability to download your records to your home computer. Keeping a record of your blood glucose readings is especially helpful if you are wanting to look for patterns. Make notes to go along with the blood glucose readings because you might forget what happened at that exact time. Keeping good records helps you and your health care provider to work together to achieve good diabetes management.  Document Released: 05/15/2003 Document Revised: 09/26/2013 Document Reviewed: 10/04/2012 [facility] Patient Information 2015 [place], [place]. This information is not intended to replace advice given to you by your health care provider. Make sure you discuss any questions you have with your health care provider.

## 2014-09-04 NOTE — ED Notes (Signed)
Unsuccessful IV x 2.  R and L AC.

## 2014-09-04 NOTE — ED Notes (Signed)
IV unsuccessful x 1 by Luellen Pucker, RN.

## 2014-09-04 NOTE — ED Notes (Signed)
PA Erron Wengert at the bedside   

## 2014-09-05 ENCOUNTER — Encounter (HOSPITAL_COMMUNITY): Payer: Self-pay | Admitting: Emergency Medicine

## 2014-09-05 ENCOUNTER — Encounter: Payer: Self-pay | Admitting: Pediatrics

## 2014-09-05 ENCOUNTER — Emergency Department (HOSPITAL_COMMUNITY)
Admission: EM | Admit: 2014-09-05 | Discharge: 2014-09-06 | Disposition: A | Discharge status: Home / Self Care | Payer: Medicaid Other | Attending: Emergency Medicine | Admitting: Emergency Medicine

## 2014-09-05 ENCOUNTER — Ambulatory Visit (INDEPENDENT_AMBULATORY_CARE_PROVIDER_SITE_OTHER): Payer: Medicaid Other | Admitting: Pediatrics

## 2014-09-05 VITALS — BP 132/82 | Temp 96.8°F | Wt 223.0 lb

## 2014-09-05 DIAGNOSIS — Z3202 Encounter for pregnancy test, result negative: Secondary | ICD-10-CM | POA: Insufficient documentation

## 2014-09-05 DIAGNOSIS — R739 Hyperglycemia, unspecified: Secondary | ICD-10-CM

## 2014-09-05 DIAGNOSIS — F909 Attention-deficit hyperactivity disorder, unspecified type: Secondary | ICD-10-CM | POA: Diagnosis not present

## 2014-09-05 DIAGNOSIS — J45909 Unspecified asthma, uncomplicated: Secondary | ICD-10-CM | POA: Insufficient documentation

## 2014-09-05 DIAGNOSIS — Z794 Long term (current) use of insulin: Secondary | ICD-10-CM | POA: Diagnosis not present

## 2014-09-05 DIAGNOSIS — A499 Bacterial infection, unspecified: Secondary | ICD-10-CM

## 2014-09-05 DIAGNOSIS — Z880 Allergy status to penicillin: Secondary | ICD-10-CM | POA: Diagnosis not present

## 2014-09-05 DIAGNOSIS — B9689 Other specified bacterial agents as the cause of diseases classified elsewhere: Secondary | ICD-10-CM

## 2014-09-05 DIAGNOSIS — E1165 Type 2 diabetes mellitus with hyperglycemia: Secondary | ICD-10-CM

## 2014-09-05 DIAGNOSIS — E669 Obesity, unspecified: Secondary | ICD-10-CM | POA: Diagnosis not present

## 2014-09-05 DIAGNOSIS — Z79899 Other long term (current) drug therapy: Secondary | ICD-10-CM | POA: Insufficient documentation

## 2014-09-05 DIAGNOSIS — N898 Other specified noninflammatory disorders of vagina: Secondary | ICD-10-CM | POA: Insufficient documentation

## 2014-09-05 DIAGNOSIS — N760 Acute vaginitis: Secondary | ICD-10-CM | POA: Diagnosis not present

## 2014-09-05 DIAGNOSIS — Z720 Tobacco use: Secondary | ICD-10-CM | POA: Insufficient documentation

## 2014-09-05 DIAGNOSIS — A64 Unspecified sexually transmitted disease: Secondary | ICD-10-CM | POA: Diagnosis not present

## 2014-09-05 DIAGNOSIS — IMO0001 Reserved for inherently not codable concepts without codable children: Secondary | ICD-10-CM

## 2014-09-05 LAB — COMPREHENSIVE METABOLIC PANEL
ALK PHOS: 97 U/L (ref 39–117)
ALT: 19 U/L (ref 0–35)
AST: 31 U/L (ref 0–37)
Albumin: 4 g/dL (ref 3.5–5.2)
Anion gap: 12 (ref 5–15)
BUN: 6 mg/dL (ref 6–23)
CALCIUM: 9.2 mg/dL (ref 8.4–10.5)
CO2: 24 mmol/L (ref 19–32)
Chloride: 98 mmol/L (ref 96–112)
Creatinine, Ser: 0.61 mg/dL (ref 0.50–1.10)
Glucose, Bld: 343 mg/dL — ABNORMAL HIGH (ref 70–99)
POTASSIUM: 3.8 mmol/L (ref 3.5–5.1)
Sodium: 134 mmol/L — ABNORMAL LOW (ref 135–145)
Total Bilirubin: 0.4 mg/dL (ref 0.3–1.2)
Total Protein: 8.2 g/dL (ref 6.0–8.3)

## 2014-09-05 LAB — HSV 2 ANTIBODY, IGG

## 2014-09-05 LAB — HSV 1 ANTIBODY, IGG: HSV 1 Glycoprotein G Ab, IgG: <0.91 index (ref 0.00–0.90)

## 2014-09-05 LAB — POC URINE PREG, ED: PREG TEST UR: NEGATIVE

## 2014-09-05 LAB — URINALYSIS, ROUTINE W REFLEX MICROSCOPIC
BILIRUBIN URINE: NEGATIVE
Glucose, UA: >1000 mg/dL — AB
Ketones, ur: 15 mg/dL — AB
Nitrite: NEGATIVE
PROTEIN: 30 mg/dL — AB
Specific Gravity, Urine: 1.025 (ref 1.005–1.030)
UROBILINOGEN UA: 0.2 mg/dL (ref 0.0–1.0)
pH: 6 (ref 5.0–8.0)

## 2014-09-05 LAB — CBC WITH DIFFERENTIAL/PLATELET
BASOS ABS: 0 10*3/uL (ref 0.0–0.1)
Basophils Relative: 0 % (ref 0–1)
EOS ABS: 0 10*3/uL (ref 0.0–0.7)
Eosinophils Relative: 0 % (ref 0–5)
HCT: 39.3 % (ref 36.0–46.0)
Hemoglobin: 12.4 g/dL (ref 12.0–15.0)
LYMPHS ABS: 2.8 10*3/uL (ref 0.7–4.0)
Lymphocytes Relative: 27 % (ref 12–46)
MCH: 25.1 pg — AB (ref 26.0–34.0)
MCHC: 31.6 g/dL (ref 30.0–36.0)
MCV: 79.6 fL (ref 78.0–100.0)
MONO ABS: 0.5 10*3/uL (ref 0.1–1.0)
Monocytes Relative: 5 % (ref 3–12)
NEUTROS ABS: 7 10*3/uL (ref 1.7–7.7)
Neutrophils Relative %: 68 % (ref 43–77)
Platelets: 474 10*3/uL — ABNORMAL HIGH (ref 150–400)
RBC: 4.94 MIL/uL (ref 3.87–5.11)
RDW: 14.1 % (ref 11.5–15.5)
WBC Morphology: INCREASED
WBC: 10.3 10*3/uL (ref 4.0–10.5)

## 2014-09-05 LAB — URINE MICROSCOPIC-ADD ON

## 2014-09-05 LAB — I-STAT VENOUS BLOOD GAS, ED
Acid-base deficit: 2 mmol/L (ref 0.0–2.0)
Bicarbonate: 22.2 mEq/L (ref 20.0–24.0)
O2 Saturation: 69 %
PCO2 VEN: 34.1 mmHg — AB (ref 45.0–50.0)
PH VEN: 7.422 — AB (ref 7.250–7.300)
PO2 VEN: 35 mmHg (ref 30.0–45.0)
TCO2: 23 mmol/L (ref 0–100)

## 2014-09-05 LAB — POCT URINALYSIS DIPSTICK
BILIRUBIN UA: NEGATIVE
Glucose, UA: 1000
NITRITE UA: NEGATIVE
Protein, UA: 30
Spec Grav, UA: 1.02
Urobilinogen, UA: NEGATIVE
pH, UA: 5.5

## 2014-09-05 LAB — GLUCOSE, POCT (MANUAL RESULT ENTRY): POC Glucose: 438 mg/dl — AB (ref 70–99)

## 2014-09-05 LAB — POCT GLYCOSYLATED HEMOGLOBIN (HGB A1C): Hemoglobin A1C: 14.3

## 2014-09-05 LAB — GC/CHLAMYDIA PROBE AMP (~~LOC~~) NOT AT ARMC
Chlamydia: NEGATIVE
NEISSERIA GONORRHEA: POSITIVE — AB

## 2014-09-05 LAB — CBG MONITORING, ED
Glucose-Capillary: 359 mg/dL — ABNORMAL HIGH (ref 70–99)
Glucose-Capillary: 234 mg/dL — ABNORMAL HIGH (ref 70–99)

## 2014-09-05 LAB — HIV ANTIBODY (ROUTINE TESTING W REFLEX): HIV Screen 4th Generation wRfx: NONREACTIVE

## 2014-09-05 LAB — RPR: RPR Ser Ql: NONREACTIVE

## 2014-09-05 MED ORDER — INSULIN ASPART 100 UNIT/ML ~~LOC~~ SOLN
10.0000 [IU] | Freq: Once | SUBCUTANEOUS | Status: AC
  Administered 2014-09-05: 10 [IU] via INTRAVENOUS
  Filled 2014-09-05: qty 1

## 2014-09-05 MED ORDER — METRONIDAZOLE 500 MG PO TABS: 500.0000 mg | ORAL_TABLET | Freq: Two times a day (BID) | ORAL | Status: DC

## 2014-09-05 MED ORDER — SODIUM CHLORIDE 0.9 % IV BOLUS (SEPSIS)
1000.0000 mL | INTRAVENOUS | Status: AC
  Administered 2014-09-05: 1000 mL via INTRAVENOUS

## 2014-09-05 MED ORDER — LIDOCAINE HCL 2 % EX GEL
1.0000 "application " | Freq: Once | CUTANEOUS | Status: AC
  Administered 2014-09-05: 1 via TOPICAL
  Filled 2014-09-05: qty 20

## 2014-09-05 MED ORDER — CEFIXIME 400 MG PO CAPS: 400.0000 mg | ORAL_CAPSULE | Freq: Every day | ORAL | Status: DC

## 2014-09-05 MED ORDER — AZITHROMYCIN 250 MG PO TABS
1000.0000 mg | ORAL_TABLET | Freq: Once | ORAL | Status: AC
  Administered 2014-09-05: 1000 mg via ORAL

## 2014-09-05 NOTE — Patient Instructions (Signed)
Please go to the ED for stabilization of your glucose. It is very important to control your sugar levels as you can have ketoacidosis. The vaginal infections are also flaring up due to poor diabetes control. Medications have been sent to the pharmacy for the STI but it will be treated in the ED.

## 2014-09-05 NOTE — ED Notes (Signed)
Unable to obtain IV access, pt states that she does not want another try to gain IV access unless it is by ultrasound

## 2014-09-05 NOTE — ED Notes (Signed)
Pt. reports elevated blood sugar this afternoon 493 , denies pain or discomfort , respirations unlabored , alert and oriented . Pt.'s family stated that she needs to be admitted but refused initial blood tests at triage .

## 2014-09-05 NOTE — ED Notes (Signed)
NP Beth at bedside

## 2014-09-05 NOTE — ED Provider Notes (Signed)
CSN: [id number]     Arrival date & time 09/05/14  1858 History   First MD Initiated Contact with Patient 09/05/14 2006     Chief Complaint  Patient presents with  . Hyperglycemia   (Consider location/radiation/quality/duration/timing/severity/associated sxs/prior Treatment) HPI  Mercedes Valdez is a 19 yo female presenting from her PCP office for treatment of hyperglycemia. She was seen in ED yesterday for elevated blood sugars and treated with IV fluids and insulin and discharged home. She is also seen for vaginal discharge and treated for gonorrhea. They presented to her PCPs office today for initiation of treatment for her diabetes because she had stopped taking both her insulin and oral diabetes medicines over 1 year ago. Her only symptom currently is dysuria and continued vaginal discharge.  She denies any fevers, chills, abd pain, vaginal bleeding, increased urination, blurred vision, nausea, or vomiting.    Past Medical History  Diagnosis Date  . ADHD (attention deficit hyperactivity disorder)   . Diabetes mellitus     Type 2  . Obesity   . PCOS (polycystic ovarian syndrome)   . Elevated blood pressure   . Asthma    Past Surgical History  Procedure Laterality Date  . External ear surgery      ear tubes   Family History  Problem Relation Age of Onset  . Diabetes Mother   . Vision loss Mother   . ADD / ADHD Brother   . Allergies Brother   . Asthma Brother   . Vision loss Brother   . Cancer Paternal Grandfather     Colon Cancer  . Heart disease Father 106    died from MI at age 56   History  Substance Use Topics  . Smoking status: Current Every Day Smoker -- 0.20 packs/day    Types: Cigarettes  . Smokeless tobacco: Not on file  . Alcohol Use: Yes   OB History    No data available     Review of Systems  Constitutional: Negative for fever and chills.  HENT: Negative for sore throat.   Eyes: Negative for visual disturbance.  Respiratory: Negative for cough and  shortness of breath.   Cardiovascular: Negative for chest pain and leg swelling.  Gastrointestinal: Negative for nausea, vomiting and diarrhea.  Endocrine: Negative for polydipsia, polyphagia and polyuria.  Genitourinary: Positive for dysuria and vaginal discharge. Negative for flank pain and vaginal bleeding.  Musculoskeletal: Negative for myalgias.  Skin: Negative for rash.  Neurological: Negative for weakness, numbness and headaches.    Allergies  Amoxicillin; Amoxicillin-pot clavulanate; Cephalosporins; NOM:VEHMCNOB; and Penicillins  Home Medications   Prior to Admission medications   Medication Sig Start Date End Date Taking? Authorizing Provider  acyclovir (ZOVIRAX) 400 MG tablet Take 2 tablets (800 mg total) by mouth 5 (five) times daily. 09/04/14   Hannah Muthersbaugh, PA-C  albuterol (PROVENTIL HFA;VENTOLIN HFA) 108 (90 BASE) MCG/ACT inhaler Inhale 2 puffs into the lungs every 4 (four) hours as needed for wheezing. 11/16/13   Karlene Einstein, MD  albuterol (PROVENTIL) (2.5 MG/3ML) 0.083% nebulizer solution Take 3 mLs (2.5 mg total) by nebulization every 4 (four) hours as needed for wheezing or shortness of breath. 11/16/13   Karlene Einstein, MD  Cefixime (SUPRAX) 400 MG CAPS capsule Take 1 capsule (400 mg total) by mouth daily. 09/05/14   Shruti [name], MD  doxycycline (VIBRAMYCIN) 100 MG capsule Take 1 capsule (100 mg total) by mouth 2 (two) times daily. 09/04/14   Hannah Muthersbaugh, PA-C  fluconazole (DIFLUCAN)  150 MG tablet Take 1 tablet (150 mg total) by mouth once. In 3 days if symptoms persist 09/04/14   Jarrett Soho Muthersbaugh, PA-C  ibuprofen (ADVIL,MOTRIN) 200 MG tablet Take 600 mg by mouth every 6 (six) hours as needed for mild pain or moderate pain.     Historical Provider, MD  Insulin Pen Needle 31G X 5 MM MISC Use 1 time daily as instructed. Patient not taking: Reported on 09/04/2014 04/12/13   Philemon Kingdom, MD  metFORMIN (GLUCOPHAGE) 500 MG tablet Take 1 tablet (500 mg total)  by mouth 2 (two) times daily with a meal. Patient not taking: Reported on 09/04/2014 12/29/13   Gaspar Skeeters, MD  metroNIDAZOLE (FLAGYL) 500 MG tablet Take 1 tablet (500 mg total) by mouth 2 (two) times daily. 09/05/14   Shruti [name], MD  omeprazole (PRILOSEC) 20 MG capsule Take 1 capsule (20 mg total) by mouth daily. Patient not taking: Reported on 09/04/2014 11/21/13   Leigh-Anne Cioffredi, MD   BP 150/105 mmHg  Temp(Src) 99 F (37.2 C) (Oral)  Resp 16  Ht 5\' 4"  (1.626 m)  Wt 222 lb (100.699 kg)  BMI 38.09 kg/m2  SpO2 97%  LMP 08/27/2014 Physical Exam  Constitutional: She appears well-developed and well-nourished. No distress.  HENT:  Head: Normocephalic and atraumatic.  Mouth/Throat: Oropharynx is clear and moist. Mucous membranes are dry. No oropharyngeal exudate.  Eyes: Conjunctivae are normal. Pupils are equal, round, and reactive to light.  Neck: Neck supple.  Cardiovascular: Normal rate, regular rhythm and intact distal pulses.   Pulmonary/Chest: Effort normal and breath sounds normal. No respiratory distress. She has no wheezes. She has no rales. She exhibits no tenderness.  Abdominal: Soft. There is no tenderness.  Genitourinary:  Vaginal exam declined by pt  Musculoskeletal: She exhibits no tenderness.  Lymphadenopathy:    She has no cervical adenopathy.  Neurological: She is alert.  Skin: Skin is warm and dry. No rash noted. She is not diaphoretic.  Psychiatric: She has a normal mood and affect.  Nursing note and vitals reviewed.   ED Course  Procedures (including critical care time) Labs Review Labs Reviewed  CBC WITH DIFFERENTIAL/PLATELET - Abnormal; Notable for the following:    MCH 25.1 (*)    Platelets 474 (*)    All other components within normal limits  COMPREHENSIVE METABOLIC PANEL - Abnormal; Notable for the following:    Sodium 134 (*)    Glucose, Bld 343 (*)    All other components within normal limits  URINALYSIS, ROUTINE W REFLEX MICROSCOPIC -  Abnormal; Notable for the following:    APPearance CLOUDY (*)    Glucose, UA >1000 (*)    Hgb urine dipstick SMALL (*)    Ketones, ur 15 (*)    Protein, ur 30 (*)    Leukocytes, UA SMALL (*)    All other components within normal limits  URINE MICROSCOPIC-ADD ON - Abnormal; Notable for the following:    Squamous Epithelial / LPF FEW (*)    Bacteria, UA MANY (*)    All other components within normal limits  CBG MONITORING, ED - Abnormal; Notable for the following:    Glucose-Capillary 359 (*)    All other components within normal limits  I-STAT VENOUS BLOOD GAS, ED - Abnormal; Notable for the following:    pH, Ven 7.422 (*)    pCO2, Ven 34.1 (*)    All other components within normal limits  CBG MONITORING, ED - Abnormal; Notable for the following:  Glucose-Capillary 234 (*)    All other components within normal limits  CBG MONITORING, ED - Abnormal; Notable for the following:    Glucose-Capillary 213 (*)    All other components within normal limits  POC URINE PREG, ED    Imaging Review No results found.   EKG Interpretation None      MDM   Final diagnoses:  Hyperglycemia   19 yo with and non-adherence to medication regimen presents from her PCP office with high blood sugars for evaluation for DKA.  Pt has also been recently diagnosed and treated for gonorrhea and genital herpes. Other than external vaginal pain after urination from herpetic lesions, pt denies any other symptoms or complaints. Discussed case with Dr. Vanita Panda. NS bolus given , CBC, CMP, VBG and UA show elevated glucose at 343 but normal pH and only 15 ketones.  Pt's labs and presentation not concerning for DKA.  Due to repated visits to the ED and apparent difficulty with outpt treatment of blood sugar, consulted hospitalist regarding pt's case.  He recommends because her blood sugars have improved in the ED with IVF and she is not acidotic, she should follow-up in the am with her PCP and can be managed as an  outpt. Discussed plan with pt and mother, will provide prescriptions for previously prescribed insulin and metformin. Pt is well-appearing, in no acute distress and vital signs reviewed and not concerning. She appears safe to be discharged.  Discharge include follow-up with their PCP in the am.  Return precautions provided. Pt and mother aware of plan and in agreement.    Filed Vitals:   09/05/14 2230 09/05/14 2300 09/05/14 2330 09/06/14 0000  BP: 146/92 143/98 129/83 136/96  Pulse: 110 114  99  Temp:    98.4 F (36.9 C)  TempSrc:    Oral  Resp:    16  Height:      Weight:      SpO2: 99% 98% 99% 100%   Meds given in ED:  Medications  sodium chloride 0.9 % bolus 1,000 mL (0 mLs Intravenous Stopped 09/05/14 2224)  sodium chloride 0.9 % bolus 1,000 mL (0 mLs Intravenous Stopped 09/05/14 2344)  insulin aspart (novoLOG) injection 10 Units (10 Units Intravenous Given 09/05/14 2310)  lidocaine (XYLOCAINE) 2 % jelly 1 application (1 application Topical Given 09/05/14 2336)    Discharge Medication List as of 09/06/2014 12:11 AM    START taking these medications   Details  lidocaine (XYLOCAINE) 2 % jelly Apply 1 application topically once., Starting 09/06/2014, Print       09/06/14 0000  metFORMIN (GLUCOPHAGE) 500 MG tablet 2 times daily with meals Discontinue Reprint 09/06/14 0007   09/06/14 0000  Insulin Pen Needle 31G X 5 MM MISC Discontinue Reprint 09/06/14 0007   09/06/14 0000  lidocaine (XYLOCAINE) 2 % jelly Once Discontinue Reprint 09/06/14 0007          Britt Bottom, NP 09/07/14 1224  Carmin Muskrat, MD 09/07/14 2318

## 2014-09-05 NOTE — ED Notes (Signed)
Eric RN in room to attempt ultrasound IV access.

## 2014-09-05 NOTE — Progress Notes (Signed)
Subjective:    Mercedes Valdez is a 19 y.o. female accompanied by self presenting to the clinic today for follow up from the ED for hyperglycemia & vaginitis. She was suspected to have herpes due to genital lesions & was started on acyclovir. She was also started on doxycycline for possible STI as she has several antibiotic allergies- amox, augmentin, cephalosporins & Penicillin. On reviewing her labs, it is noted that she is positive for gonorrhea. She also has clue cells on her wet prep indicating bacterial vaginosis. Mercedes Valdez is in severe pain in her perinium & having burning & dysuria due to her genital lesions. She is using licocaine gel topically with some relief. She continues to have yellow vaginal discharge Her herpes culture is pending. 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 HIV, RPR negative. She is sexually active with 2 partners but not using condoms regularly. She had a nexplanon placed previously but had it taken off 05/2013 due to persistent bleeding. On 4/11 at her ED visit glucose found to be elevated at 530.Pt received IVF and glucose stabilizer per protocol. Her blood sugar decreased to 265 at the time of discharge. They did not wish to stay any longer in the ED though the plan was to stabilize blood sugars. Pt has been non-compliant with diabetes management & been off medications for the past year. She has not seen Endocrinology since 04/2013. She was followed by Dr Cruzita Lederer from [place]. She has DM 2 diagnosed in 2011, insulin dependent- uncontrolled. She was prev on Lantus 25 U at bedtime, Glipizide 10 mg bid & metformin 1000 mg bid. She had gastritis with metformin so was very non-compliant & did not like the insulin shots so stopped taking them.   Review of Systems  Constitutional: Positive for activity change and appetite change. Negative for fever.  HENT: Negative for congestion.   Eyes: Negative for visual disturbance.  Respiratory: Negative for cough and wheezing.     Cardiovascular: Negative for chest pain.  Gastrointestinal: Positive for abdominal pain. Negative for nausea and vomiting.  Endocrine: Negative for polydipsia and polyuria.  Genitourinary: Positive for vaginal discharge, difficulty urinating, genital sores and vaginal pain. Negative for urgency and vaginal bleeding.  Skin: Negative for rash.  Neurological: Negative for dizziness and headaches.  Hematological: Negative for adenopathy.  Psychiatric/Behavioral: Negative for sleep disturbance.       Objective:   Physical Exam  Constitutional: She is oriented to person, place, and time.  Patient is in distress due to pain in the perineum. Difficultly walking due to genital sores  HENT:  Right Ear: External ear normal.  Left Ear: External ear normal.  Mouth/Throat: Oropharynx is clear and moist.  Eyes: Conjunctivae are normal. Pupils are equal, round, and reactive to light.  Cardiovascular: Normal rate, regular rhythm and normal heart sounds.   Pulmonary/Chest: Breath sounds normal. She has no wheezes.  Abdominal: Bowel sounds are normal. She exhibits no distension.  Genitourinary:  External vaginal exam showed several ulcerated lesions in the vulvo-vaginal area. Yellow & mucoid drainage in the vaginal area. Erythema of the vulvo vaginal region. Lesions very tender on palpation  Lymphadenopathy:    She has no cervical adenopathy.  Neurological: She is alert and oriented to person, place, and time.  Skin: Skin is warm. No rash noted.   .BP 132/82 mmHg  Temp(Src) 96.8 F (36 C) (Temporal)  Wt 223 lb (101.152 kg)  LMP 08/27/2014        Assessment & Plan:  1. Hyperglycemia- DM type 2, insulin dependent with very poor control & non compliance  - POCT glycosylated hemoglobin (Hb A1C)- 14.3, POCT Glucose- 438 - POCT urinalysis dipstick- large ketones & glucose 1000  Plan to send patient to ED & then admit to adult floor for glucose stabilization & restart of insulin . Case  discussed with ED physician Dr Vanita Panda. Patient needs plan for insulin therapy & po meds prior to discharge home as glucose stabilization outpatient is difficult due to non compliance & uncotrolled glucose levels.  2. Gonorrhea Pt is on doxycycline. On discussing with parent on the phone, she reported that Mercedes Valdez is not allergic to azithromycin & has bene on it before. Gave patient Zithromax 1 gm in clinic. She has a h/o allergy to amox & cephalosporin but it was at age 12 yrs & the only reaction was rash/hives- small lesions. No wheezing, no facial swelling, no emesis. Discussed with parent that we can try the alternative of Cefixime 400 mg 1 dose & that was sent to the pharmacy. That could be a trial instead of IM cetriaxone. Signs of allergic reaction was discussed & patient was advised to take benedryl if has rash & also use Epipen that mom has at home & call EMS if wheezing or facial swelling. It is unlikely considering that the allergy was documented in early childhood with only hives & no further exposure. Patient did not get the medication as the plan was to go to ED. Needs test of cure 7 days after treatment.  3. Bacterial vaginosis To start Flagyl 500 mg bid * 7 days  4. Herpetic lesions: F/u culture report. Continue oral acyclovir. Topical lidocaine to the lesions.  Also need to plan to transition care to family medicine or Internal medicine as patient turns 19 yrs next week. Pt needs a new referral to Endocrine- will place that in the system.  Extensive counseling given to patient.  Claudean Kinds, MD 09/05/2014 6:45 PM

## 2014-09-06 ENCOUNTER — Other Ambulatory Visit: Payer: Self-pay | Admitting: Pediatrics

## 2014-09-06 ENCOUNTER — Telehealth: Payer: Self-pay | Admitting: Pediatrics

## 2014-09-06 ENCOUNTER — Telehealth (HOSPITAL_COMMUNITY): Payer: Self-pay

## 2014-09-06 DIAGNOSIS — A64 Unspecified sexually transmitted disease: Secondary | ICD-10-CM

## 2014-09-06 DIAGNOSIS — Z794 Long term (current) use of insulin: Principal | ICD-10-CM

## 2014-09-06 DIAGNOSIS — R52 Pain, unspecified: Secondary | ICD-10-CM

## 2014-09-06 DIAGNOSIS — E1165 Type 2 diabetes mellitus with hyperglycemia: Secondary | ICD-10-CM

## 2014-09-06 DIAGNOSIS — E119 Type 2 diabetes mellitus without complications: Secondary | ICD-10-CM | POA: Insufficient documentation

## 2014-09-06 LAB — HERPES SIMPLEX VIRUS CULTURE: Culture: DETECTED

## 2014-09-06 LAB — CBG MONITORING, ED: Glucose-Capillary: 213 mg/dL — ABNORMAL HIGH (ref 70–99)

## 2014-09-06 MED ORDER — IBUPROFEN 600 MG PO TABS: 600.0000 mg | ORAL_TABLET | Freq: Four times a day (QID) | ORAL | Status: DC | PRN

## 2014-09-06 MED ORDER — ACCU-CHEK SOFT TOUCH LANCETS MISC: Status: DC

## 2014-09-06 MED ORDER — GLUCOSE BLOOD VI STRP: ORAL_STRIP | Status: DC

## 2014-09-06 MED ORDER — INSULIN PEN NEEDLE 31G X 5 MM MISC: Status: DC

## 2014-09-06 MED ORDER — METFORMIN HCL 500 MG PO TABS: 500.0000 mg | ORAL_TABLET | Freq: Two times a day (BID) | ORAL | Status: DC

## 2014-09-06 MED ORDER — CEFIXIME 400 MG PO CAPS: 400.0000 mg | ORAL_CAPSULE | Freq: Once | ORAL | Status: DC

## 2014-09-06 MED ORDER — INSULIN GLARGINE 100 UNIT/ML ~~LOC~~ SOLN: 25.0000 [IU] | Freq: Every day | SUBCUTANEOUS | Status: DC

## 2014-09-06 MED ORDER — LIDOCAINE HCL 2 % EX GEL: 1.0000 "application " | Freq: Once | CUTANEOUS | Status: DC

## 2014-09-06 NOTE — ED Notes (Signed)
Positive for gonorrhea. Chart sent to edp office for review

## 2014-09-06 NOTE — ED Provider Notes (Signed)
5:30 PM  Pt positive for gonorrhea. She is currently on doxycycline. Will notify her that her partners need to be tested and treated.  [place], DO 09/06/14 1729

## 2014-09-06 NOTE — Telephone Encounter (Signed)
Dr. Derrell Lolling spoke with mother/family.

## 2014-09-06 NOTE — Telephone Encounter (Signed)
Called mom & also talked to [name]. She was in the adult ED overnight & received saline bolus & insulin & discharged home as her glucose was 230. She has not been treated for the STI. I clarified all the prescriptions. Advised not taking doxycycline as she was treated in clinic yesterday with zithromax. Also advised picking up po suprax- 1 dose treatment for Gonorrhea. Alo discussed signs of allergy & anaphylaxis. Use benedryl if hives. Mom has Epipen at home. To ED if any signs of worsening reaction. The h/o rash to suprax at age 19 does not seem like a true allergy.  Also sent script for Lantus 25 U to the pharmacy as only needles given at the ED. Advised starting metformin 500 mg bid & gradually go up to 1000 mg bid ( 2 tablets) over the next week.  [name] is attempting to make a referral to Endo. [facility] no longer taking MCD. [facility] booking to August. Wil check with [name]. Will also discuss with mom to call Family practice to see if they can take her. appt set up with me for follow up next week.  Claudean Kinds, MD [place] for [place], [place] [address] Ph: [phone] Fax: [phone] 09/06/2014 3:26 PM

## 2014-09-06 NOTE — Telephone Encounter (Addendum)
Mom call in today and want Dr. Derrell Lolling to call her back at [phone]. The pt was in the ER 09/05/2014. I ask if its for a follow up but they said they wanted to talk to the Dr.  And mom also stated that the Medication that had been send at the pharmacy wasn't there.

## 2014-09-06 NOTE — Discharge Instructions (Signed)
Please follow directions provided. Is very important free to follow back up with your primary care doctor for further management of your diabetes. You may start your insulin and metformin as directed. He may use the lidocaine jelly as needed for genital pain. Be sure to drink plenty of water to stay well hydrated. You want to avoid fruit juices, sodas or other sugary beverages. Please eat a well-balanced diet with lean proteins fresh fruits and vegetables and avoid white rice, white sugar or white bread.    SEEK IMMEDIATE MEDICAL CARE IF:  You are vomiting or have diarrhea.  Your breath smells fruity.  You are breathing faster or slower.  You are very sleepy or incoherent.  You have numbness, tingling, or pain in your feet or hands.  You have chest pain.  Your symptoms get worse even though you have been following your caregiver's orders.  If you have any other questions or concerns.

## 2014-09-06 NOTE — Telephone Encounter (Deleted)
The pt was in the ER 09/05/2014. I ask if its for a follow up but they said they wanted to talk to the Dr.  And mom also stated that the Medication that had been send at the pharmacy wasn't there.

## 2014-09-06 NOTE — ED Notes (Signed)
Pt verbalized understanding and has no further questions.  

## 2014-09-07 ENCOUNTER — Telehealth: Payer: Self-pay | Admitting: *Deleted

## 2014-09-13 ENCOUNTER — Encounter: Payer: Self-pay | Admitting: Pediatrics

## 2014-09-13 ENCOUNTER — Ambulatory Visit (INDEPENDENT_AMBULATORY_CARE_PROVIDER_SITE_OTHER): Payer: Medicaid Other | Admitting: Pediatrics

## 2014-09-13 VITALS — BP 122/80 | Ht 65.35 in | Wt 231.4 lb

## 2014-09-13 DIAGNOSIS — E118 Type 2 diabetes mellitus with unspecified complications: Secondary | ICD-10-CM

## 2014-09-13 DIAGNOSIS — Z113 Encounter for screening for infections with a predominantly sexual mode of transmission: Secondary | ICD-10-CM | POA: Diagnosis not present

## 2014-09-13 LAB — POCT GLUCOSE (DEVICE FOR HOME USE): POC GLUCOSE: 254 mg/dL — AB (ref 70–99)

## 2014-09-13 NOTE — Patient Instructions (Addendum)
[place] please continue to check your blood sugars twice daily. Continue to take your Lantus insulin 25 units at night daily. Also go up on your metformin to 2 tabs (1000 mg) twice daily. You can take prilosec daily for gastritis. It is very important to watch your diet & count your carbs. Your infections seem to be under control. I will call you with results of your urine test. Please contact us if you have not heard from Endocrinology. Also make sure you call the clinic of your choice to transition to adult medicine. Here are some contact numbers.    [place], [place] [address] Get Driving Directions View Web Site  Main: [place] [address] [place],  [address] Get Driving Directions Hours of Operation Mon - Fri: 9 a.m. - 6 p.m. Main: [phone]

## 2014-09-13 NOTE — Progress Notes (Signed)
    Subjective:    Mercedes Valdez is a 19 y.o. female accompanied by self presenting to the clinic today for hospital f/u from last week for hyperglycemia & STI. Last week Mercedes Valdez was sent to the ED from the clinic for elevated blood glucose & STI which was not completely treated due to multiple drug allergies. At the last visit it was determined that she does not have allergy to azithromycin- so received 1 gm in clinic. Also given po suprax 400 mg-1 dose for gonorrhea instead of rocephin due to some suspicion of cephalosporin alergy. Pt reports that she took suprax without any side effects. She completed course of flagyl for BV & also completed acyclovir for genital herpes. Her genital lesions have improved & she no longer has pain on urination. Last week she was restarted on Lantus 25 U & metformin 1000 mg bid to increase upto 2000 mg bid. Mercedes Valdez reports that she has been taking insulin & metformin daily & checking BG twice daily. Her levels have been around 250. This has improved from last week levels in mid 400. She is waiting for an Endo consult as [facility] no longer takes MCD patients. She is also trying to transition to adult care- family practice where mom goes. She denies any fevers, fatigue or fainting episodes.  Review of Systems  Constitutional: Negative for fever, activity change and appetite change.  HENT: Negative for congestion.   Eyes: Negative for visual disturbance.  Respiratory: Negative for cough, chest tightness and wheezing.   Cardiovascular: Negative for chest pain.  Gastrointestinal: Negative for nausea, vomiting and abdominal pain.  Endocrine: Negative for polydipsia and polyuria.  Genitourinary: Positive for genital sores. Negative for urgency, vaginal bleeding, vaginal discharge, difficulty urinating and vaginal pain.  Skin: Negative for rash.  Neurological: Negative for dizziness and headaches.  Hematological: Negative for adenopathy.  Psychiatric/Behavioral:  Negative for sleep disturbance.       Objective:   Physical Exam  Constitutional: She is oriented to person, place, and time. She appears well-developed.  HENT:  Right Ear: External ear normal.  Left Ear: External ear normal.  Mouth/Throat: Oropharynx is clear and moist.  Eyes: Conjunctivae are normal. Pupils are equal, round, and reactive to light.  Cardiovascular: Normal rate, regular rhythm and normal heart sounds.   Pulmonary/Chest: Breath sounds normal. She has no wheezes.  Abdominal: Bowel sounds are normal. She exhibits no distension.  Genitourinary:  External vaginal exam healing lesions in the vulvo-vaginal area.  Lymphadenopathy:    She has no cervical adenopathy.  Neurological: She is alert and oriented to person, place, and time.  Skin: Skin is warm. No rash noted.   .BP 122/80 mmHg  Ht 5' 5.35" (1.66 m)  Wt 231 lb 6.4 oz (104.962 kg)  BMI 38.09 kg/m2  LMP 08/27/2014        Assessment & Plan:  1. Type 2 diabetes mellitus- uncontrolled. Advise checking BG twice a day maintaining a log. Continue Lantus 25 units at bedtime. Increase metform to 2000 mg bid. Take zantac and prilosoc for gastritis  - POCT Glucose (Device for Home Use)  2. Screening examination for venereal disease Repeat testing done. If GC remain to be positive, can treat with cetriaxone - GC/chlamydia probe amp, urine  Detailed counseling regarding compliance with meds. Will advise Mercedes Valdez to follow up on Endo referral. Transition to adult medicine.  Return in about 3 months (around 12/13/2014).  Claudean Kinds, MD 09/16/2014 1:18 AM

## 2014-09-14 LAB — GC/CHLAMYDIA PROBE AMP, URINE
Chlamydia, Swab/Urine, PCR: NEGATIVE
GC Probe Amp, Urine: NEGATIVE

## 2014-09-25 ENCOUNTER — Encounter: Payer: Self-pay | Admitting: Pediatrics

## 2014-09-25 ENCOUNTER — Ambulatory Visit (INDEPENDENT_AMBULATORY_CARE_PROVIDER_SITE_OTHER): Payer: Self-pay | Admitting: Clinical

## 2014-09-25 ENCOUNTER — Telehealth: Payer: Self-pay | Admitting: Clinical

## 2014-09-25 ENCOUNTER — Ambulatory Visit (INDEPENDENT_AMBULATORY_CARE_PROVIDER_SITE_OTHER): Payer: Self-pay | Admitting: Pediatrics

## 2014-09-25 VITALS — BP 136/90 | Ht 64.17 in | Wt 225.6 lb

## 2014-09-25 DIAGNOSIS — Z1329 Encounter for screening for other suspected endocrine disorder: Secondary | ICD-10-CM

## 2014-09-25 DIAGNOSIS — Z794 Long term (current) use of insulin: Secondary | ICD-10-CM

## 2014-09-25 DIAGNOSIS — Z658 Other specified problems related to psychosocial circumstances: Secondary | ICD-10-CM

## 2014-09-25 DIAGNOSIS — E1165 Type 2 diabetes mellitus with hyperglycemia: Secondary | ICD-10-CM

## 2014-09-25 DIAGNOSIS — Z609 Problem related to social environment, unspecified: Secondary | ICD-10-CM

## 2014-09-25 DIAGNOSIS — E119 Type 2 diabetes mellitus without complications: Secondary | ICD-10-CM

## 2014-09-25 DIAGNOSIS — A6000 Herpesviral infection of urogenital system, unspecified: Secondary | ICD-10-CM

## 2014-09-25 DIAGNOSIS — A609 Anogenital herpesviral infection, unspecified: Secondary | ICD-10-CM

## 2014-09-25 LAB — POCT GLUCOSE (DEVICE FOR HOME USE): POC Glucose: 397 mg/dl — AB (ref 70–99)

## 2014-09-25 MED ORDER — ACYCLOVIR 200 MG PO CAPS: 800.0000 mg | ORAL_CAPSULE | Freq: Two times a day (BID) | ORAL | Status: AC

## 2014-09-25 MED ORDER — METFORMIN HCL 500 MG PO TABS: 1000.0000 mg | ORAL_TABLET | Freq: Two times a day (BID) | ORAL | Status: DC

## 2014-09-25 MED ORDER — ACYCLOVIR 200 MG PO CAPS: 800.0000 mg | ORAL_CAPSULE | Freq: Two times a day (BID) | ORAL | Status: DC

## 2014-09-25 MED ORDER — INSULIN GLARGINE 100 UNIT/ML ~~LOC~~ SOLN: 25.0000 [IU] | Freq: Every day | SUBCUTANEOUS | Status: DC

## 2014-09-25 NOTE — Patient Instructions (Signed)
Please start acyclovir back again for 5 days to treat the infection. Please make sure you continue to take your insulin daily & metformin. It is very important to watch your diet & make sure that you are counting your carbs. Please avoid beverages with sugar. Please contact the given resources for eligibility & make sure that you call the family medicine clinic to start new patient paperwork.

## 2014-09-25 NOTE — Telephone Encounter (Signed)
Ms. Leeanne Deed called to clarify about the Medicaid coverage.  Ms. Leeanne Deed reported that when she called [employer] and spoke with a case manager regarding Medicaid for [place], they were informed it was expired on 09/23/14.  Ms. Leeanne Deed wanted to clarify since Camry was informed by [facility] staff that she has Medicaid until the end of May 2016.  This [facility] will confirm with the billing department regarding Medicaid coverage and contact the mother tomorrow.

## 2014-09-26 DIAGNOSIS — Z658 Other specified problems related to psychosocial circumstances: Secondary | ICD-10-CM | POA: Insufficient documentation

## 2014-09-26 NOTE — Telephone Encounter (Signed)
This [facility] spoke to mother and informed her about the situation with the insurance since [place] informed mother about it.  This [facility] provided her the Ref #  (Letter I) [id number] given by [facility] regarding her situation.  Mother will try to follow up with [facility] [place] regarding her insurance.   At this time, patient will go to the [facility] tomorrow to obtain her medication since they do have a financial assistance program.  When patient went to the pharmacy today, they were informed her insurance was expired so they could not get the medicine.  Mother was informed by the patient regarding the transition to [place] and to expect the patient welcome packet so it can be completed to schedule an appointment.  Mother was also encouraged to contact [facility] regarding her appointment this month and see if they can provide assistance so she can see the doctor there.  Mother acknowledged understanding and will continue to support [name].

## 2014-09-26 NOTE — Progress Notes (Signed)
Subjective:    Mercedes Valdez is a 19 y.o. female accompanied by self presenting to the clinic today for recurrence of genital lesions. [name] had been recently treated with a course of Acyclovir for genital herpes. The lesions had resolved. She however noticed for the past 5 days painful lesions have returned in the genital area. She has discomfort while urinating. At the last visit she had reported to be improving with the glucose control. She has been compliant with her metform- now taking 1 gm bid & has also been taking daily Lantus at night. Her glucose level is btw 200-250 mg/dl but today was elevated as she had drunk a bottle of sweet tea right before the appt. She denies headache, dizziness or abdominal pain. She however was very anxious today as she learnt that her medicaid was discontinued due to aging out. She was tearful & her BP was elevated to 140/90. Repeat was 136/90. She has an upcoming appt with Endocrinology at [facility]. They had rescheduled an appt for 09/28/14 to 10/17/14 due to transportation issues. She or mom have not yet called [facility] or mom's family practitioner to establish care for [facility].   Review of Systems  Constitutional: Negative for fever, activity change and appetite change.  HENT: Negative for congestion.   Eyes: Negative for visual disturbance.  Respiratory: Negative for cough, chest tightness and wheezing.   Cardiovascular: Negative for chest pain and palpitations.  Gastrointestinal: Negative for nausea, vomiting and abdominal pain.  Endocrine: Negative for polydipsia and polyuria.  Genitourinary: Positive for genital sores. Negative for urgency, vaginal bleeding, vaginal discharge, difficulty urinating and vaginal pain.  Skin: Negative for rash.  Neurological: Negative for dizziness, tremors, weakness, light-headedness and headaches.  Hematological: Negative for adenopathy.  Psychiatric/Behavioral: Negative for sleep disturbance.      Objective:   Physical Exam  Constitutional: She is oriented to person, place, and time. She appears well-developed.  HENT:  Right Ear: External ear normal.  Left Ear: External ear normal.  Mouth/Throat: Oropharynx is clear and moist.  Eyes: Conjunctivae are normal. Pupils are equal, round, and reactive to light.  Cardiovascular: Normal rate, regular rhythm and normal heart sounds.   Pulmonary/Chest: Breath sounds normal. She has no wheezes.  Abdominal: Bowel sounds are normal. She exhibits no distension.  Genitourinary:  Patient is on her menstrual cycle so declined genital exam but reported to have examined herself with a mirror & reports that the lesions were similar to previous outbreak  Lymphadenopathy:    She has no cervical adenopathy.  Neurological: She is alert and oriented to person, place, and time.  Skin: Skin is warm. No rash noted.   .BP 136/90 mmHg  Ht 5' 4.17" (1.63 m)  Wt 225 lb 9.6 oz (102.331 kg)  BMI 38.52 kg/m2  LMP 08/27/2014        Assessment & Plan:  1. Recurrent genital herpes Will treat with another cycle of acyclovir. Script given to be filled at [employer] or health dept if MCD has bene discontinued - acyclovir (ZOVIRAX) 200 MG capsule; Take 4 capsules (800 mg total) by mouth 2 (two) times daily.  Dispense: 40 capsule; Refill: 0   2. Type 2 diabetes mellitus with hyperglycemia BG was 397 mg/dl. Pt did not give a urine sample. She had just finished a bottle of sweet tea. Detailed dietary advice given. Refilled her medications & advised to recheck level before bedtime. Fluid hydration with water- no juice or soda. - insulin glargine (LANTUS) 100  UNIT/ML injection; Inject 0.25 mLs (25 Units total) into the skin at bedtime.  Dispense: 10 mL; Refill: 1 - metFORMIN (GLUCOPHAGE) 500 MG tablet; Take 2 tablets (1,000 mg total) by mouth 2 (two) times daily with a meal.  Dispense: 120 tablet; Refill: 3  3. Psychosocial stressors. Referred to [facility] who also contacted billing & financial dept. It was told that Jenna has MCD till May 31. Jasmine discussed case with [facility] & they will be enrolling her with an orange card & making an appt for her.  Pt appeared to be calm at the end of the visit but still tearful. Will re-evaluate BP, BG in 2 days. Alanis Clift to go to the ED if has nausea, vomiting, abdominal pain, headache or blurry vision. Return in about 2 days (around 09/27/2014), or if symptoms worsen or fail to improve.   The visit lasted for 25 minutes and > 50% of the visit time was spent on counseling regarding the treatment plan and importance of compliance with chosen management options.  Claudean Kinds, MD 09/26/2014 7:18 PM

## 2014-09-27 ENCOUNTER — Ambulatory Visit: Payer: Medicaid Other | Admitting: Pediatrics

## 2014-10-01 NOTE — Progress Notes (Signed)
Referring Provider: Loleta Chance, MD Session Time:  1430 - 1500 (30 minutes) Type of Service: [place] Interpreter: No.  Interpreter Name & Language: n/a   PRESENTING CONCERNS:  Mercedes Valdez is a 19 y.o. female brought in by patient. Mercedes Valdez was referred to [facility] for adequate access to medical treatment.  Mercedes Valdez was informed that her Medicaid lapsed and will also need a PCP for adults.    Mercedes Valdez had reported limited finances.   GOALS ADDRESSED:  Adequate access to healthcare system for current needs.   INTERVENTIONS:  Assessed current concerns/immediate needs Collaborated with other systems to provide support network  ASSESSMENT/OUTCOME:  Mercedes Valdez presented to be quiet and acknowledged understanding of her situation.    During the visit, Mercedes Valdez, Billing Staff, was consulted and when Mercedes Valdez reviewed Mercedes Valdez for patient's coverage, Mercedes Valdez reported she was covered until May 2016. Patient was informed about coverage and informed to go to the pharmacy as soon as possible to pick up medications.  During the visit, patient was referred to Mercedes Valdez. First available appointment there will be in June 2016 and patient was instructed to complete the patient packet when she receives it from [place].  Mercedes Valdez acknowledged understanding.  Mercedes Valdez reported no other concerns or immediate needs at this time.  PLAN:  Follow up with Mercedes Valdez and family as needed.   Mercedes Valdez for Children

## 2014-11-14 ENCOUNTER — Ambulatory Visit: Payer: Medicaid Other | Admitting: Pediatrics

## 2015-07-15 ENCOUNTER — Encounter (HOSPITAL_COMMUNITY): Payer: Self-pay | Admitting: *Deleted

## 2015-07-15 ENCOUNTER — Emergency Department (HOSPITAL_COMMUNITY)
Admission: EM | Admit: 2015-07-15 | Discharge: 2015-07-15 | Disposition: A | Discharge status: Home / Self Care | Payer: Self-pay | Attending: Emergency Medicine | Admitting: Emergency Medicine

## 2015-07-15 DIAGNOSIS — Z794 Long term (current) use of insulin: Secondary | ICD-10-CM | POA: Insufficient documentation

## 2015-07-15 DIAGNOSIS — Z3202 Encounter for pregnancy test, result negative: Secondary | ICD-10-CM | POA: Insufficient documentation

## 2015-07-15 DIAGNOSIS — J45909 Unspecified asthma, uncomplicated: Secondary | ICD-10-CM | POA: Insufficient documentation

## 2015-07-15 DIAGNOSIS — E669 Obesity, unspecified: Secondary | ICD-10-CM | POA: Insufficient documentation

## 2015-07-15 DIAGNOSIS — Z7984 Long term (current) use of oral hypoglycemic drugs: Secondary | ICD-10-CM | POA: Insufficient documentation

## 2015-07-15 DIAGNOSIS — F17210 Nicotine dependence, cigarettes, uncomplicated: Secondary | ICD-10-CM | POA: Insufficient documentation

## 2015-07-15 DIAGNOSIS — N730 Acute parametritis and pelvic cellulitis: Secondary | ICD-10-CM | POA: Insufficient documentation

## 2015-07-15 DIAGNOSIS — Z79899 Other long term (current) drug therapy: Secondary | ICD-10-CM | POA: Insufficient documentation

## 2015-07-15 DIAGNOSIS — Z880 Allergy status to penicillin: Secondary | ICD-10-CM | POA: Insufficient documentation

## 2015-07-15 DIAGNOSIS — E119 Type 2 diabetes mellitus without complications: Secondary | ICD-10-CM | POA: Insufficient documentation

## 2015-07-15 LAB — URINE MICROSCOPIC-ADD ON
Bacteria, UA: NONE SEEN
RBC / HPF: NONE SEEN RBC/hpf (ref 0–5)

## 2015-07-15 LAB — URINALYSIS, ROUTINE W REFLEX MICROSCOPIC
Bilirubin Urine: NEGATIVE
Glucose, UA: >1000 mg/dL — AB
Hgb urine dipstick: NEGATIVE
KETONES UR: 15 mg/dL — AB
NITRITE: NEGATIVE
Protein, ur: NEGATIVE mg/dL
Specific Gravity, Urine: 1.037 — ABNORMAL HIGH (ref 1.005–1.030)
pH: 6 (ref 5.0–8.0)

## 2015-07-15 LAB — WET PREP, GENITAL
Sperm: NONE SEEN
Trich, Wet Prep: NONE SEEN
Yeast Wet Prep HPF POC: NONE SEEN

## 2015-07-15 LAB — PREGNANCY, URINE: Preg Test, Ur: NEGATIVE

## 2015-07-15 LAB — POC URINE PREG, ED: Preg Test, Ur: NEGATIVE

## 2015-07-15 MED ORDER — STERILE WATER FOR INJECTION IJ SOLN
INTRAMUSCULAR | Status: AC
  Administered 2015-07-15: 10 mL
  Filled 2015-07-15: qty 10

## 2015-07-15 MED ORDER — DOXYCYCLINE HYCLATE 100 MG PO CAPS: 100.0000 mg | ORAL_CAPSULE | Freq: Two times a day (BID) | ORAL | Status: DC

## 2015-07-15 MED ORDER — CEFTRIAXONE SODIUM 250 MG IJ SOLR
250.0000 mg | Freq: Once | INTRAMUSCULAR | Status: AC
  Administered 2015-07-15: 250 mg via INTRAMUSCULAR
  Filled 2015-07-15: qty 250

## 2015-07-15 MED ORDER — IBUPROFEN 800 MG PO TABS: 800.0000 mg | ORAL_TABLET | Freq: Three times a day (TID) | ORAL | Status: DC | PRN

## 2015-07-15 NOTE — ED Provider Notes (Signed)
CSN: [id number]     Arrival date & time 07/15/15  1311 History   First MD Initiated Contact with Patient 07/15/15 1819     Chief Complaint  Patient presents with  . Abdominal Pain     (Consider location/radiation/quality/duration/timing/severity/associated sxs/prior Treatment) HPI Patient presents to the Emergency Department complaining of abdominal pain lasting a week. Patient has a PMH of PCOS, DM II, and genital Herpes. Patient states that she began having abdominal cramping 1 weeks ago when she had her period. The pain is crampy, and patient states that it is constant. Nothing worsens the pain. Patient has tried ibuprofen for the pain with no relief. She states that her periods are irregular. LMP was three days and ended on Feb 14. She had one previously on Feb 2 that lasted three days. Patient is currently sexually active and is not on any form of contraception. She is unaware if she currently has any STI/STDsShe endorses increasing amounts of vaginal discharge, that occasionally smells fishy. Patient endorses chronic lightheadedness and dizziness which is not a new problem. She has had intermittent nausea over the past week. She denies headache, vomiting, diarrhea, constipation, dysuria, urinary frequency, syncope, weakness, chest pain, SOB, palpitations.  Past Medical History  Diagnosis Date  . ADHD (attention deficit hyperactivity disorder)   . Diabetes mellitus     Type 2  . Obesity   . PCOS (polycystic ovarian syndrome)   . Elevated blood pressure   . Asthma    Past Surgical History  Procedure Laterality Date  . External ear surgery      ear tubes   Family History  Problem Relation Age of Onset  . Diabetes Mother   . Vision loss Mother   . ADD / ADHD Brother   . Allergies Brother   . Asthma Brother   . Vision loss Brother   . Cancer Paternal Grandfather     Colon Cancer  . Heart disease Father 32    died from MI at age 64   Social History  Substance Use Topics  .  Smoking status: Current Every Day Smoker -- 0.20 packs/day    Types: Cigarettes  . Smokeless tobacco: None  . Alcohol Use: Yes   OB History    No data available     Review of Systems All other systems negative except as documented in the HPI. All pertinent positives and negatives as reviewed in the HPI.   Allergies  Amoxicillin; Amoxicillin-pot clavulanate; and Penicillins  Home Medications   Prior to Admission medications   Medication Sig Start Date End Date Taking? Authorizing Provider  albuterol (PROVENTIL HFA;VENTOLIN HFA) 108 (90 BASE) MCG/ACT inhaler Inhale 2 puffs into the lungs every 4 (four) hours as needed for wheezing. 11/16/13   Karlene Einstein, MD  albuterol (PROVENTIL) (2.5 MG/3ML) 0.083% nebulizer solution Take 3 mLs (2.5 mg total) by nebulization every 4 (four) hours as needed for wheezing or shortness of breath. 11/16/13   Karlene Einstein, MD  glucose blood test strip Check blood glucose twice a day 09/06/14   Shruti [name], MD  ibuprofen (ADVIL,MOTRIN) 200 MG tablet Take 600 mg by mouth every 6 (six) hours as needed for mild pain or moderate pain.     Historical Provider, MD  ibuprofen (ADVIL,MOTRIN) 600 MG tablet Take 1 tablet (600 mg total) by mouth every 6 (six) hours as needed. 09/06/14   Shruti [name], MD  insulin glargine (LANTUS) 100 UNIT/ML injection Inject 0.25 mLs (25 Units total) into the skin  at bedtime. 09/25/14   Shruti [name], MD  Insulin Pen Needle 31G X 5 MM MISC Use 1 time daily as instructed. 09/06/14   Britt Bottom, NP  Lancets (ACCU-CHEK SOFT TOUCH) lancets Check blood glucose twice a day 09/06/14   Shruti Simha V, MD  lidocaine (XYLOCAINE) 2 % jelly Apply 1 application topically once. 09/06/14   Britt Bottom, NP  metFORMIN (GLUCOPHAGE) 500 MG tablet Take 2 tablets (1,000 mg total) by mouth 2 (two) times daily with a meal. 09/25/14   Shruti [name], MD  omeprazole (PRILOSEC) 20 MG capsule Take 1 capsule (20 mg total) by mouth daily. Patient not  taking: Reported on 09/04/2014 11/21/13   Leigh-Anne Cioffredi, MD   BP 145/84 mmHg  Pulse 106  Temp(Src) 98.4 F (36.9 C) (Oral)  Resp 18  SpO2 99% Physical Exam  Constitutional: She is oriented to person, place, and time. She appears well-developed and well-nourished. No distress.  HENT:  Head: Normocephalic and atraumatic.  Right Ear: External ear normal.  Left Ear: External ear normal.  Eyes: Conjunctivae and EOM are normal. Pupils are equal, round, and reactive to light.  Neck: Normal range of motion. Neck supple.  Cardiovascular: Normal rate, regular rhythm, normal heart sounds and intact distal pulses.  Exam reveals no gallop and no friction rub.   No murmur heard. Pulmonary/Chest: Effort normal and breath sounds normal. No respiratory distress. She has no wheezes. She has no rales. She exhibits no tenderness.  Abdominal: Soft. Bowel sounds are normal. She exhibits distension. She exhibits no mass. There is tenderness. There is no rebound and no guarding.  Genitourinary: Vagina normal. Cervix exhibits motion tenderness and discharge. Right adnexum displays tenderness. Right adnexum displays no mass. Left adnexum displays no mass and no tenderness.  Lymphadenopathy:    She has no cervical adenopathy.  Neurological: She is alert and oriented to person, place, and time. She has normal reflexes.  Skin: Skin is warm and dry. No rash noted. She is not diaphoretic. No erythema. No pallor.  Patient has abdominal striae  Psychiatric: She has a normal mood and affect. Her behavior is normal. Judgment and thought content normal.    ED Course  Procedures (including critical care time) Labs Review Labs Reviewed  URINALYSIS, [place] (NOT AT [facility])  POC URINE PREG, ED    Imaging Review No results found. I have personally reviewed and evaluated these images and lab results as part of my medical decision-making.  Patient be treated for PID.  Told to return here as  needed.  Patient is given [facility] with the [facility].  Patient agrees the plan and all questions were answered    Dalia Heading, PA-C 07/16/15 0130  Jola Schmidt, MD 07/16/15 [phone]

## 2015-07-15 NOTE — ED Notes (Signed)
Pt reports lower abdominal pain and cramping with 2 periods in the last month.

## 2015-07-15 NOTE — Discharge Instructions (Signed)
Return here as needed.  Follow-up with the clinic provided.  °

## 2015-07-16 LAB — GC/CHLAMYDIA PROBE AMP (~~LOC~~) NOT AT ARMC
Chlamydia: POSITIVE — AB
Neisseria Gonorrhea: POSITIVE — AB

## 2015-07-17 ENCOUNTER — Telehealth (HOSPITAL_BASED_OUTPATIENT_CLINIC_OR_DEPARTMENT_OTHER): Payer: Self-pay | Admitting: Emergency Medicine

## 2015-08-08 ENCOUNTER — Encounter (HOSPITAL_COMMUNITY): Payer: Self-pay | Admitting: *Deleted

## 2015-08-08 ENCOUNTER — Emergency Department (HOSPITAL_COMMUNITY)
Admission: EM | Admit: 2015-08-08 | Discharge: 2015-08-08 | Disposition: A | Discharge status: Home / Self Care | Payer: Self-pay | Attending: Emergency Medicine | Admitting: Emergency Medicine

## 2015-08-08 DIAGNOSIS — Z880 Allergy status to penicillin: Secondary | ICD-10-CM | POA: Insufficient documentation

## 2015-08-08 DIAGNOSIS — F17210 Nicotine dependence, cigarettes, uncomplicated: Secondary | ICD-10-CM | POA: Insufficient documentation

## 2015-08-08 DIAGNOSIS — Z792 Long term (current) use of antibiotics: Secondary | ICD-10-CM | POA: Insufficient documentation

## 2015-08-08 DIAGNOSIS — E669 Obesity, unspecified: Secondary | ICD-10-CM | POA: Insufficient documentation

## 2015-08-08 DIAGNOSIS — Z3202 Encounter for pregnancy test, result negative: Secondary | ICD-10-CM | POA: Insufficient documentation

## 2015-08-08 DIAGNOSIS — Z8659 Personal history of other mental and behavioral disorders: Secondary | ICD-10-CM | POA: Insufficient documentation

## 2015-08-08 DIAGNOSIS — A6009 Herpesviral infection of other urogenital tract: Secondary | ICD-10-CM | POA: Insufficient documentation

## 2015-08-08 DIAGNOSIS — Z79899 Other long term (current) drug therapy: Secondary | ICD-10-CM | POA: Insufficient documentation

## 2015-08-08 DIAGNOSIS — J45909 Unspecified asthma, uncomplicated: Secondary | ICD-10-CM | POA: Insufficient documentation

## 2015-08-08 DIAGNOSIS — E119 Type 2 diabetes mellitus without complications: Secondary | ICD-10-CM | POA: Insufficient documentation

## 2015-08-08 DIAGNOSIS — R112 Nausea with vomiting, unspecified: Secondary | ICD-10-CM | POA: Insufficient documentation

## 2015-08-08 DIAGNOSIS — A6000 Herpesviral infection of urogenital system, unspecified: Secondary | ICD-10-CM

## 2015-08-08 LAB — URINALYSIS, ROUTINE W REFLEX MICROSCOPIC
BILIRUBIN URINE: NEGATIVE
HGB URINE DIPSTICK: NEGATIVE
Ketones, ur: NEGATIVE mg/dL
Leukocytes, UA: NEGATIVE
Nitrite: NEGATIVE
PH: 7 (ref 5.0–8.0)
Protein, ur: NEGATIVE mg/dL
SPECIFIC GRAVITY, URINE: 1.03 (ref 1.005–1.030)

## 2015-08-08 LAB — WET PREP, GENITAL
SPERM: NONE SEEN
Trich, Wet Prep: NONE SEEN
YEAST WET PREP: NONE SEEN

## 2015-08-08 LAB — URINE MICROSCOPIC-ADD ON

## 2015-08-08 LAB — PREGNANCY, URINE: Preg Test, Ur: NEGATIVE

## 2015-08-08 MED ORDER — ACYCLOVIR 400 MG PO TABS: 400.0000 mg | ORAL_TABLET | Freq: Three times a day (TID) | ORAL | Status: DC

## 2015-08-08 NOTE — ED Provider Notes (Signed)
CSN: [id number]     Arrival date & time 08/08/15  0907 History   First MD Initiated Contact with Patient 08/08/15 1052     Chief Complaint  Patient presents with  . Emesis  . Genital Warts     (Consider location/radiation/quality/duration/timing/severity/associated sxs/prior Treatment) HPI Patient reports she had nausea and vomiting yesterday while at work and was sent to the hospital to make sure there was nothing wrong with her. She reports she vomited 5 times yesterday. No abdominal pain and no diarrhea. She states that she has had no vomiting today.  Secondary complaint is outbreak of genital herpes. Patient reports that she had a primary outbreak about a year ago. She is noticed to spots that have developed on her vaginal area. She reports they're little sores. They're not exquisitely tender. She would like to get medication for the outbreak. Patient reports that she was treated for PID a month ago. Patient poor she has had no sexual activity since that time. She denies she's having abnormal vaginal discharge, abdominal or low back pain. Past Medical History  Diagnosis Date  . ADHD (attention deficit hyperactivity disorder)   . Diabetes mellitus     Type 2  . Obesity   . PCOS (polycystic ovarian syndrome)   . Elevated blood pressure   . Asthma    Past Surgical History  Procedure Laterality Date  . External ear surgery      ear tubes   Family History  Problem Relation Age of Onset  . Diabetes Mother   . Vision loss Mother   . ADD / ADHD Brother   . Allergies Brother   . Asthma Brother   . Vision loss Brother   . Cancer Paternal Grandfather     Colon Cancer  . Heart disease Father 32    died from MI at age 2   Social History  Substance Use Topics  . Smoking status: Current Every Day Smoker -- 0.20 packs/day    Types: Cigarettes  . Smokeless tobacco: None  . Alcohol Use: Yes   OB History    No data available     Review of Systems  10 Systems reviewed and are  negative for acute change except as noted in the HPI.   Allergies  Amoxicillin; Augmentin; Penicillins; and Suprax  Home Medications   Prior to Admission medications   Medication Sig Start Date End Date Taking? Authorizing Provider  acyclovir (ZOVIRAX) 400 MG tablet Take 1 tablet (400 mg total) by mouth 3 (three) times daily. 08/08/15   Charlesetta Shanks, MD  albuterol (PROVENTIL HFA;VENTOLIN HFA) 108 (90 BASE) MCG/ACT inhaler Inhale 2 puffs into the lungs every 4 (four) hours as needed for wheezing. 11/16/13   Karlene Einstein, MD  albuterol (PROVENTIL) (2.5 MG/3ML) 0.083% nebulizer solution Take 3 mLs (2.5 mg total) by nebulization every 4 (four) hours as needed for wheezing or shortness of breath. 11/16/13   Karlene Einstein, MD  doxycycline (VIBRAMYCIN) 100 MG capsule Take 1 capsule (100 mg total) by mouth 2 (two) times daily. 07/15/15   Dalia Heading, PA-C  glucose blood test strip Check blood glucose twice a day 09/06/14   Shruti [name], MD  ibuprofen (ADVIL,MOTRIN) 800 MG tablet Take 1 tablet (800 mg total) by mouth every 8 (eight) hours as needed. 07/15/15   Dalia Heading, PA-C  insulin glargine (LANTUS) 100 UNIT/ML injection Inject 0.25 mLs (25 Units total) into the skin at bedtime. Patient not taking: Reported on 07/15/2015 09/25/14   Shruti Simha  V, MD  Insulin Pen Needle 31G X 5 MM MISC Use 1 time daily as instructed. 09/06/14   Britt Bottom, NP  Lancets (ACCU-CHEK SOFT TOUCH) lancets Check blood glucose twice a day 09/06/14   Shruti Simha V, MD  lidocaine (XYLOCAINE) 2 % jelly Apply 1 application topically once. 09/06/14   Britt Bottom, NP  metFORMIN (GLUCOPHAGE) 500 MG tablet Take 2 tablets (1,000 mg total) by mouth 2 (two) times daily with a meal. Patient not taking: Reported on 07/15/2015 09/25/14   Shruti [name], MD  omeprazole (PRILOSEC) 20 MG capsule Take 1 capsule (20 mg total) by mouth daily. Patient not taking: Reported on 09/04/2014 11/21/13   Leigh-Anne Cioffredi, MD    BP 137/90 mmHg  Pulse 90  Temp(Src) 98.1 F (36.7 C) (Oral)  Resp 20  SpO2 98%  LMP 07/28/2015 Physical Exam  Constitutional: She is oriented to person, place, and time. She appears well-developed and well-nourished.  HENT:  Head: Normocephalic and atraumatic.  Eyes: EOM are normal. Pupils are equal, round, and reactive to light.  Neck: Neck supple.  Cardiovascular: Normal rate, regular rhythm, normal heart sounds and intact distal pulses.   Pulmonary/Chest: Effort normal and breath sounds normal.  Abdominal: Soft. Bowel sounds are normal. She exhibits no distension. There is no tenderness.  Genitourinary:  External genitalia shows an approximately 4 mm, shallow ulcerative lesion on the labia majora. There is a second lesion more posterior on the left that is on the soft tissues of the buttock close to the labia. Again this has shallow ulcer with crusted edges. Vaginal introitus normal with small amount of whitish discharge. Cervix is not friable no discharge. Bimanual examination is nontender.  Musculoskeletal: Normal range of motion. She exhibits no edema.  Neurological: She is alert and oriented to person, place, and time. She has normal strength. Coordination normal. GCS eye subscore is 4. GCS verbal subscore is 5. GCS motor subscore is 6.  Skin: Skin is warm, dry and intact.  Psychiatric: She has a normal mood and affect.    ED Course  Procedures (including critical care time) Labs Review Labs Reviewed  WET PREP, GENITAL  PREGNANCY, URINE  URINALYSIS, ROUTINE W REFLEX MICROSCOPIC (NOT AT [facility])  RPR  HIV ANTIBODY (ROUTINE TESTING)  URINALYSIS, ROUTINE W REFLEX MICROSCOPIC (NOT AT [facility])  GC/CHLAMYDIA PROBE AMP ([place]) NOT AT [facility]    Imaging Review No results found. I have personally reviewed and evaluated these images and lab results as part of my medical decision-making.   EKG Interpretation None      MDM   Final diagnoses:  Non-intractable vomiting with  nausea, vomiting of unspecified type  Genital herpes   Patient reports she was sent by work because she was vomiting at work yesterday. At this time she is well in appearance. Symptoms have resolved. She has a completely nontender abdomen. Secondary complaint was for a secondary genital herpes outbreak. This is fairly minor without symptoms at this time of any associated cervicitis or PID. Patient denies sexual activity for the past month since last treatment objectively on exam is not showing symptoms of cervicitis or PID. Will await cultures for additional treatment if needed. Patient made aware. Prescription for acyclovir given.    Charlesetta Shanks, MD 08/08/15 [id number]

## 2015-08-08 NOTE — Discharge Instructions (Signed)
Genital Herpes Genital herpes is a common sexually transmitted infection (STI) that is caused by a virus. The virus is spread from person to person through sexual contact. Infection can cause itching, blisters, and sores in the genital area or rectal area. This is called an outbreak. It affects both men and women. Genital herpes is particularly concerning for pregnant women because the virus can be passed to the baby during delivery and cause serious problems. Genital herpes is also a concern for people with a weakened defense (immune) system. Symptoms of genital herpes may last several days and then go away. However, the virus remains in your body, so you may have more outbreaks of symptoms in the future. The time between outbreaks varies and can be months or years. CAUSES Genital herpes is caused by a virus called herpes simplex virus (HSV) type 2 or HSV type 1. These viruses are contagious and are most often spread through sexual contact with an infected person. Sexual contact includes vaginal, anal, and oral sex. RISK FACTORS Risk factors for genital herpes include:  Being sexually active with multiple partners.  Having unprotected sex. SIGNS AND SYMPTOMS Symptoms may include:  Pain and itching in the genital area or rectal area.  Small red bumps that turn into blisters and then turn into sores.  Flu-like symptoms, including:  Fever.  Body aches.  Painful urination.  Vaginal discharge. DIAGNOSIS Genital herpes may be diagnosed by:  Physical exam.  Blood test.  Fluid culture test from an open sore. TREATMENT There is no cure for genital herpes. Oral antiviral medicines may be used to speed up healing and to help prevent the return of symptoms. These medicines can also help to reduce the spread of the virus to sexual partners. HOME CARE INSTRUCTIONS  Keep the affected areas dry and clean.  Take medicines only as directed by your health care provider.  Do not have sexual  contact during active infections. Genital herpes is contagious.  Practice safe sex. Latex condoms and female condoms may help to prevent the spread of the herpes virus.  Avoid rubbing or touching the blisters and sores. If you do touch the blister or sores:  Wash your hands thoroughly.  Do not touch your eyes afterward.  If you become pregnant, tell your health care provider if you have had genital herpes.  Keep all follow-up visits as directed by your health care provider. This is important. PREVENTION  Use condoms. Although anyone can contract genital herpes during sexual contact even with the use of a condom, a condom can provide some protection.  Avoid having multiple sexual partners.  Talk to your sexual partner about any symptoms and past history that either of you may have.  Get tested before you have sex. Ask your partner to do the same.  Recognize the symptoms of genital herpes. Do not have sexual contact if you notice these symptoms. SEEK MEDICAL CARE IF:  Your symptoms are not improving with medicine.  Your symptoms return.  You have new symptoms.  You have a fever.  You have abdominal pain.  You have redness, swelling, or pain in your eye. MAKE SURE YOU:  Understand these instructions.  Will watch your condition.  Will get help right away if you are not doing well or get worse.   This information is not intended to replace advice given to you by your health care provider. Make sure you discuss any questions you have with your health care provider.   Document Released: 05/09/2000  Document Revised: 06/02/2014 Document Reviewed: 09/27/2013 Elsevier Interactive Patient Education 2016 Elsevier Inc. Nausea and Vomiting Nausea is a sick feeling that often comes before throwing up (vomiting). Vomiting is a reflex where stomach contents come out of your mouth. Vomiting can cause severe loss of body fluids (dehydration). Children and elderly adults can become  dehydrated quickly, especially if they also have diarrhea. Nausea and vomiting are symptoms of a condition or disease. It is important to find the cause of your symptoms. CAUSES   Direct irritation of the stomach lining. This irritation can result from increased acid production (gastroesophageal reflux disease), infection, food poisoning, taking certain medicines (such as nonsteroidal anti-inflammatory drugs), alcohol use, or tobacco use.  Signals from the brain.These signals could be caused by a headache, heat exposure, an inner ear disturbance, increased pressure in the brain from injury, infection, a tumor, or a concussion, pain, emotional stimulus, or metabolic problems.  An obstruction in the gastrointestinal tract (bowel obstruction).  Illnesses such as diabetes, hepatitis, gallbladder problems, appendicitis, kidney problems, cancer, sepsis, atypical symptoms of a heart attack, or eating disorders.  Medical treatments such as chemotherapy and radiation.  Receiving medicine that makes you sleep (general anesthetic) during surgery. DIAGNOSIS Your caregiver may ask for tests to be done if the problems do not improve after a few days. Tests may also be done if symptoms are severe or if the reason for the nausea and vomiting is not clear. Tests may include:  Urine tests.  Blood tests.  Stool tests.  Cultures (to look for evidence of infection).  X-rays or other imaging studies. Test results can help your caregiver make decisions about treatment or the need for additional tests. TREATMENT You need to stay well hydrated. Drink frequently but in small amounts.You may wish to drink water, sports drinks, clear broth, or eat frozen ice pops or gelatin dessert to help stay hydrated.When you eat, eating slowly may help prevent nausea.There are also some antinausea medicines that may help prevent nausea. HOME CARE INSTRUCTIONS   Take all medicine as directed by your caregiver.  If you do  not have an appetite, do not force yourself to eat. However, you must continue to drink fluids.  If you have an appetite, eat a normal diet unless your caregiver tells you differently.  Eat a variety of complex carbohydrates (rice, wheat, potatoes, bread), lean meats, yogurt, fruits, and vegetables.  Avoid high-fat foods because they are more difficult to digest.  Drink enough water and fluids to keep your urine clear or pale yellow.  If you are dehydrated, ask your caregiver for specific rehydration instructions. Signs of dehydration may include:  Severe thirst.  Dry lips and mouth.  Dizziness.  Dark urine.  Decreasing urine frequency and amount.  Confusion.  Rapid breathing or pulse. SEEK IMMEDIATE MEDICAL CARE IF:   You have blood or brown flecks (like coffee grounds) in your vomit.  You have black or bloody stools.  You have a severe headache or stiff neck.  You are confused.  You have severe abdominal pain.  You have chest pain or trouble breathing.  You do not urinate at least once every 8 hours.  You develop cold or clammy skin.  You continue to vomit for longer than 24 to 48 hours.  You have a fever. MAKE SURE YOU:   Understand these instructions.  Will watch your condition.  Will get help right away if you are not doing well or get worse.   This information is not  intended to replace advice given to you by your health care provider. Make sure you discuss any questions you have with your health care provider.   Document Released: 05/12/2005 Document Revised: 08/04/2011 Document Reviewed: 10/09/2010 Elsevier Interactive Patient Education [employer].

## 2015-08-08 NOTE — ED Notes (Signed)
Pt reports having n/v at work yesterday and was sent here for work note. Also wants to be checked for herpes. No acute distress noted at triage.

## 2015-08-09 LAB — GC/CHLAMYDIA PROBE AMP (~~LOC~~) NOT AT ARMC
CHLAMYDIA, DNA PROBE: POSITIVE — AB
Neisseria Gonorrhea: NEGATIVE

## 2015-08-09 LAB — RPR: RPR: NONREACTIVE

## 2015-08-09 LAB — HIV ANTIBODY (ROUTINE TESTING W REFLEX): HIV Screen 4th Generation wRfx: NONREACTIVE

## 2015-08-12 ENCOUNTER — Telehealth: Payer: Self-pay | Admitting: *Deleted

## 2015-08-12 NOTE — Telephone Encounter (Signed)
[place] ED CM consulted by FLOW management for this pt who was seen in the ED on 08/11/2015. She was called today and given the results of her Cultures and an Antibiotic was called to the [facility] [place]. She reports she needs medication assistance.   CM reviewed EPIC notes  spoke with the pt about [facility] MATCH program ($3 co pay for each Rx through [facility] program, does not include refills, 7 day expiration of [place] letter and choice of pharmacies) Pt agreed to receive assistance from program  .Pt is eligible for [facility] MATCH program (unable to find pt listed in [place] per cardholder name inquiry)PDMI information entered. [place] letter completed and faxed to previously named Pharmacy with confirmation. CM called pt to make her aware of same.   CM updated EPIC

## 2015-08-12 NOTE — Telephone Encounter (Signed)
Post ED Visit - Positive Culture Follow-up: Successful Patient Follow-Up  Culture assessed and recommendations reviewed by: []  Elenor Quinones, Pharm.D. []  Heide Guile, Pharm.D., BCPS []  Parks Neptune, Pharm.D. []  Alycia Rossetti, Pharm.D., BCPS []  [place], [place].D., BCPS, AAHIVP []  Legrand Como, Pharm.D., BCPS, AAHIVP []  Milus Glazier, Pharm.D. []  Stephens November, [place].D.  Positive chlamydia culture  [x]  Patient discharged without antimicrobial prescription and treatment is now indicated []  Organism is resistant to prescribed ED discharge antimicrobial []  Patient with positive blood cultures  Changes discussed with ED provider: [place] antibiotic prescription azithromycin 1 gram po x 1 Called to [place]  08/12/15 1625   Hazle Nordmann 08/12/2015, 4:24 PM

## 2015-09-03 ENCOUNTER — Emergency Department (HOSPITAL_COMMUNITY)
Admission: EM | Admit: 2015-09-03 | Discharge: 2015-09-03 | Disposition: A | Discharge status: Home / Self Care | Payer: Self-pay | Attending: Emergency Medicine | Admitting: Emergency Medicine

## 2015-09-03 ENCOUNTER — Emergency Department (HOSPITAL_COMMUNITY): Payer: Self-pay

## 2015-09-03 ENCOUNTER — Encounter (HOSPITAL_COMMUNITY): Payer: Self-pay | Admitting: Emergency Medicine

## 2015-09-03 DIAGNOSIS — J029 Acute pharyngitis, unspecified: Secondary | ICD-10-CM | POA: Insufficient documentation

## 2015-09-03 DIAGNOSIS — R05 Cough: Secondary | ICD-10-CM | POA: Insufficient documentation

## 2015-09-03 DIAGNOSIS — E669 Obesity, unspecified: Secondary | ICD-10-CM | POA: Insufficient documentation

## 2015-09-03 DIAGNOSIS — J3489 Other specified disorders of nose and nasal sinuses: Secondary | ICD-10-CM | POA: Insufficient documentation

## 2015-09-03 DIAGNOSIS — Z79899 Other long term (current) drug therapy: Secondary | ICD-10-CM | POA: Insufficient documentation

## 2015-09-03 DIAGNOSIS — H6591 Unspecified nonsuppurative otitis media, right ear: Secondary | ICD-10-CM

## 2015-09-03 DIAGNOSIS — Z880 Allergy status to penicillin: Secondary | ICD-10-CM | POA: Insufficient documentation

## 2015-09-03 DIAGNOSIS — Z794 Long term (current) use of insulin: Secondary | ICD-10-CM | POA: Insufficient documentation

## 2015-09-03 DIAGNOSIS — H748X1 Other specified disorders of right middle ear and mastoid: Secondary | ICD-10-CM | POA: Insufficient documentation

## 2015-09-03 DIAGNOSIS — E119 Type 2 diabetes mellitus without complications: Secondary | ICD-10-CM | POA: Insufficient documentation

## 2015-09-03 DIAGNOSIS — Z792 Long term (current) use of antibiotics: Secondary | ICD-10-CM | POA: Insufficient documentation

## 2015-09-03 DIAGNOSIS — F17210 Nicotine dependence, cigarettes, uncomplicated: Secondary | ICD-10-CM | POA: Insufficient documentation

## 2015-09-03 DIAGNOSIS — J45909 Unspecified asthma, uncomplicated: Secondary | ICD-10-CM | POA: Insufficient documentation

## 2015-09-03 DIAGNOSIS — Z8659 Personal history of other mental and behavioral disorders: Secondary | ICD-10-CM | POA: Insufficient documentation

## 2015-09-03 MED ORDER — FLUTICASONE PROPIONATE 50 MCG/ACT NA SUSP: 2.0000 | Freq: Every day | NASAL | Status: DC

## 2015-09-03 MED ORDER — CETIRIZINE HCL 10 MG PO TABS: 10.0000 mg | ORAL_TABLET | Freq: Every day | ORAL | Status: DC

## 2015-09-03 MED ORDER — CARBAMIDE PEROXIDE 6.5 % OT SOLN
5.0000 [drp] | Freq: Once | OTIC | Status: AC
  Administered 2015-09-03: 5 [drp] via OTIC
  Filled 2015-09-03: qty 15

## 2015-09-03 NOTE — ED Provider Notes (Signed)
CSN: [id number]     Arrival date & time 09/03/15  1716 History   First MD Initiated Contact with Patient 09/03/15 1904     Chief Complaint  Patient presents with  . Otalgia     (Consider location/radiation/quality/duration/timing/severity/associated sxs/prior Treatment) Patient is a 20 y.o. female presenting with ear pain.  Otalgia Location:  Right Quality:  Pressure Severity:  No pain Duration:  2 days Timing:  Constant Progression:  Unchanged Chronicity:  New Context: not direct blow and not foreign body in ear   Relieved by:  None tried Worsened by:  Nothing tried Ineffective treatments:  None tried Associated symptoms: congestion, cough, rhinorrhea and sore throat   Associated symptoms: no ear discharge, no fever, no headaches, no hearing loss, no neck pain, no tinnitus and no vomiting    Patient presents with 2 days of constant, mild, unchanged right ear pressure. Denies pain. No prior treatments. Denies fever, chills, neck pain. She does state that she has had a mild productive cough for the past week along with nasal congestion and rhinorrhea.  Past Medical History  Diagnosis Date  . ADHD (attention deficit hyperactivity disorder)   . Diabetes mellitus     Type 2  . Obesity   . PCOS (polycystic ovarian syndrome)   . Elevated blood pressure   . Asthma    Past Surgical History  Procedure Laterality Date  . External ear surgery      ear tubes   Family History  Problem Relation Age of Onset  . Diabetes Mother   . Vision loss Mother   . ADD / ADHD Brother   . Allergies Brother   . Asthma Brother   . Vision loss Brother   . Cancer Paternal Grandfather     Colon Cancer  . Heart disease Father 57    died from MI at age 41   Social History  Substance Use Topics  . Smoking status: Current Every Day Smoker -- 0.20 packs/day    Types: Cigarettes  . Smokeless tobacco: None  . Alcohol Use: Yes   OB History    No data available     Review of Systems   Constitutional: Negative for fever.  HENT: Positive for congestion, ear pain, rhinorrhea and sore throat. Negative for ear discharge, hearing loss and tinnitus.   Respiratory: Positive for cough.   Gastrointestinal: Negative for vomiting.  Musculoskeletal: Negative for neck pain and neck stiffness.  Neurological: Negative for headaches.  All other systems reviewed and are negative.     Allergies  Amoxicillin; Augmentin; Penicillins; and Suprax  Home Medications   Prior to Admission medications   Medication Sig Start Date End Date Taking? Authorizing Provider  acyclovir (ZOVIRAX) 400 MG tablet Take 1 tablet (400 mg total) by mouth 3 (three) times daily. 08/08/15   Charlesetta Shanks, MD  albuterol (PROVENTIL HFA;VENTOLIN HFA) 108 (90 BASE) MCG/ACT inhaler Inhale 2 puffs into the lungs every 4 (four) hours as needed for wheezing. 11/16/13   Karlene Einstein, MD  albuterol (PROVENTIL) (2.5 MG/3ML) 0.083% nebulizer solution Take 3 mLs (2.5 mg total) by nebulization every 4 (four) hours as needed for wheezing or shortness of breath. 11/16/13   Karlene Einstein, MD  doxycycline (VIBRAMYCIN) 100 MG capsule Take 1 capsule (100 mg total) by mouth 2 (two) times daily. 07/15/15   Dalia Heading, PA-C  glucose blood test strip Check blood glucose twice a day 09/06/14   Shruti [name], MD  ibuprofen (ADVIL,MOTRIN) 800 MG tablet Take 1 tablet (  800 mg total) by mouth every 8 (eight) hours as needed. 07/15/15   Dalia Heading, PA-C  insulin glargine (LANTUS) 100 UNIT/ML injection Inject 0.25 mLs (25 Units total) into the skin at bedtime. Patient not taking: Reported on 07/15/2015 09/25/14   Ok Edwards, MD  Insulin Pen Needle 31G X 5 MM MISC Use 1 time daily as instructed. 09/06/14   Britt Bottom, NP  Lancets (ACCU-CHEK SOFT TOUCH) lancets Check blood glucose twice a day 09/06/14   Shruti Simha V, MD  lidocaine (XYLOCAINE) 2 % jelly Apply 1 application topically once. 09/06/14   Britt Bottom, NP   metFORMIN (GLUCOPHAGE) 500 MG tablet Take 2 tablets (1,000 mg total) by mouth 2 (two) times daily with a meal. Patient not taking: Reported on 07/15/2015 09/25/14   Shruti [name], MD  omeprazole (PRILOSEC) 20 MG capsule Take 1 capsule (20 mg total) by mouth daily. Patient not taking: Reported on 09/04/2014 11/21/13   Leigh-Anne Cioffredi, MD   BP 144/94 mmHg  Pulse 103  Temp(Src) 98.1 F (36.7 C) (Oral)  Resp 18  SpO2 100%  LMP 07/28/2015 Physical Exam  Constitutional: She is oriented to person, place, and time. She appears well-developed and well-nourished.  Non-toxic appearance. She does not have a sickly appearance. She does not appear ill.  HENT:  Head: Normocephalic and atraumatic.  Right Ear: External ear normal. No drainage. Tympanic membrane is not erythematous and not bulging. A middle ear effusion is present.  Left Ear: Tympanic membrane and external ear normal.  Mouth/Throat: Oropharynx is clear and moist.  Eyes: Conjunctivae are normal. Pupils are equal, round, and reactive to light.  Neck: Normal range of motion. Neck supple.  No nuchal rigidity.  Cardiovascular: Normal rate, regular rhythm and normal heart sounds.   No murmur heard. Pulmonary/Chest: Effort normal and breath sounds normal. No accessory muscle usage or stridor. No respiratory distress. She has no wheezes. She has no rhonchi. She has no rales.  Abdominal: Soft. Bowel sounds are normal. She exhibits no distension.  Musculoskeletal: Normal range of motion.  Lymphadenopathy:    She has no cervical adenopathy.  Neurological: She is alert and oriented to person, place, and time.  Speech clear without dysarthria.  Skin: Skin is warm and dry.  Psychiatric: She has a normal mood and affect. Her behavior is normal.    ED Course  Procedures (including critical care time) Labs Review Labs Reviewed - No data to display  Imaging Review No results found. I have personally reviewed and evaluated these images and  lab results as part of my medical decision-making.   EKG Interpretation None      MDM   Final diagnoses:  Middle ear effusion, right   No fever, chills, neck pain. VSS, NAD. On exam, well-appearing, nontoxic. right middle ear effusion without signs of infection. No mastoid tenderness. Prior URI last week with productive cough. Will obtain chest x-ray to evaluate for pneumonia. Moderate cerumen in right ear, treated with Debrox in ED. Plan to discharge home with Zyrtec and Flonase. Discussed return precautions.  Follow up [place]. Patient agrees and acknowledges the above plan for discharge.     Gloriann Loan, PA-C 09/03/15 2042  Forde Dandy, MD 09/03/15 [phone]

## 2015-09-03 NOTE — Discharge Instructions (Signed)
Serous Otitis Media Serous otitis media is fluid in the middle ear space. This space contains the bones for hearing and air. Air in the middle ear space helps to transmit sound.  The air gets there through the eustachian tube. This tube goes from the back of the nose (nasopharynx) to the middle ear space. It keeps the pressure in the middle ear the same as the outside world. It also helps to drain fluid from the middle ear space. CAUSES  Serous otitis media occurs when the eustachian tube gets blocked. Blockage can come from:  Ear infections.  Colds and other upper respiratory infections.  Allergies.  Irritants such as cigarette smoke.  Sudden changes in air pressure (such as descending in an airplane).  Enlarged adenoids.  A mass in the nasopharynx. During colds and upper respiratory infections, the middle ear space can become temporarily filled with fluid. This can happen after an ear infection also. Once the infection clears, the fluid will generally drain out of the ear through the eustachian tube. If it does not, then serous otitis media occurs. SIGNS AND SYMPTOMS   Hearing loss.  A feeling of fullness in the ear, without pain.  Young children may not show any symptoms but may show slight behavioral changes, such as agitation, ear pulling, or crying. DIAGNOSIS  Serous otitis media is diagnosed by an ear exam. Tests may be done to check on the movement of the eardrum. Hearing exams may also be done. TREATMENT  The fluid most often goes away without treatment. If allergy is the cause, allergy treatment may be helpful. Fluid that persists for several months may require minor surgery. A small tube is placed in the eardrum to:  Drain the fluid.  Restore the air in the middle ear space. In certain situations, antibiotic medicines are used to avoid surgery. Surgery may be done to remove enlarged adenoids (if this is the cause). HOME CARE INSTRUCTIONS   Keep children away from  tobacco smoke.  Keep all follow-up visits as directed by your health care provider. SEEK MEDICAL CARE IF:   Your hearing is not better in 3 months.  Your hearing is worse.  You have ear pain.  You have drainage from the ear.  You have dizziness.  You have serous otitis media only in one ear or have any bleeding from your nose (epistaxis).  You notice a lump on your neck. MAKE SURE YOU:  Understand these instructions.   Will watch your condition.   Will get help right away if you are not doing well or get worse.    This information is not intended to replace advice given to you by your health care provider. Make sure you discuss any questions you have with your health care provider.   Document Released: 08/02/2003 Document Revised: 06/02/2014 Document Reviewed: 12/07/2012 Elsevier Interactive Patient Education 2016 Elsevier Inc.  

## 2015-09-03 NOTE — ED Notes (Signed)
Pt reports fullness in right ear. Pt denies pain. This has been going on for the past 2 days. Pt denies drainage.

## 2015-09-03 NOTE — ED Notes (Signed)
Patient ablwe to ambulate independently

## 2015-10-11 ENCOUNTER — Emergency Department (HOSPITAL_COMMUNITY)
Admission: EM | Admit: 2015-10-11 | Discharge: 2015-10-11 | Disposition: A | Discharge status: Home / Self Care | Payer: Self-pay | Attending: Emergency Medicine | Admitting: Emergency Medicine

## 2015-10-11 ENCOUNTER — Encounter (HOSPITAL_COMMUNITY): Payer: Self-pay | Admitting: Emergency Medicine

## 2015-10-11 DIAGNOSIS — R739 Hyperglycemia, unspecified: Secondary | ICD-10-CM

## 2015-10-11 DIAGNOSIS — I10 Essential (primary) hypertension: Secondary | ICD-10-CM | POA: Insufficient documentation

## 2015-10-11 DIAGNOSIS — J45909 Unspecified asthma, uncomplicated: Secondary | ICD-10-CM | POA: Insufficient documentation

## 2015-10-11 DIAGNOSIS — Z794 Long term (current) use of insulin: Secondary | ICD-10-CM | POA: Insufficient documentation

## 2015-10-11 DIAGNOSIS — Z7984 Long term (current) use of oral hypoglycemic drugs: Secondary | ICD-10-CM | POA: Insufficient documentation

## 2015-10-11 DIAGNOSIS — F909 Attention-deficit hyperactivity disorder, unspecified type: Secondary | ICD-10-CM | POA: Insufficient documentation

## 2015-10-11 DIAGNOSIS — F17210 Nicotine dependence, cigarettes, uncomplicated: Secondary | ICD-10-CM | POA: Insufficient documentation

## 2015-10-11 DIAGNOSIS — J029 Acute pharyngitis, unspecified: Secondary | ICD-10-CM | POA: Insufficient documentation

## 2015-10-11 DIAGNOSIS — E1165 Type 2 diabetes mellitus with hyperglycemia: Secondary | ICD-10-CM | POA: Insufficient documentation

## 2015-10-11 DIAGNOSIS — Z79899 Other long term (current) drug therapy: Secondary | ICD-10-CM | POA: Insufficient documentation

## 2015-10-11 LAB — CBC WITH DIFFERENTIAL/PLATELET
BASOS PCT: 0 %
Basophils Absolute: 0 10*3/uL (ref 0.0–0.1)
Eosinophils Absolute: 0.2 10*3/uL (ref 0.0–0.7)
Eosinophils Relative: 2 %
HEMATOCRIT: 37.5 % (ref 36.0–46.0)
HEMOGLOBIN: 12.1 g/dL (ref 12.0–15.0)
LYMPHS ABS: 3.1 10*3/uL (ref 0.7–4.0)
Lymphocytes Relative: 36 %
MCH: 25.9 pg — AB (ref 26.0–34.0)
MCHC: 32.3 g/dL (ref 30.0–36.0)
MCV: 80.1 fL (ref 78.0–100.0)
MONO ABS: 0.4 10*3/uL (ref 0.1–1.0)
MONOS PCT: 4 %
NEUTROS ABS: 4.9 10*3/uL (ref 1.7–7.7)
NEUTROS PCT: 58 %
Platelets: 500 10*3/uL — ABNORMAL HIGH (ref 150–400)
RBC: 4.68 MIL/uL (ref 3.87–5.11)
RDW: 13.4 % (ref 11.5–15.5)
WBC: 8.5 10*3/uL (ref 4.0–10.5)

## 2015-10-11 LAB — COMPREHENSIVE METABOLIC PANEL
ALK PHOS: 88 U/L (ref 38–126)
ALT: 13 U/L — ABNORMAL LOW (ref 14–54)
ANION GAP: 12 (ref 5–15)
AST: 15 U/L (ref 15–41)
Albumin: 3.4 g/dL — ABNORMAL LOW (ref 3.5–5.0)
BILIRUBIN TOTAL: 0.6 mg/dL (ref 0.3–1.2)
BUN: 8 mg/dL (ref 6–20)
CO2: 22 mmol/L (ref 22–32)
CREATININE: 0.67 mg/dL (ref 0.44–1.00)
Calcium: 8.9 mg/dL (ref 8.9–10.3)
Chloride: 98 mmol/L — ABNORMAL LOW (ref 101–111)
Glucose, Bld: 458 mg/dL — ABNORMAL HIGH (ref 65–99)
POTASSIUM: 4.1 mmol/L (ref 3.5–5.1)
Sodium: 132 mmol/L — ABNORMAL LOW (ref 135–145)
Total Protein: 7.1 g/dL (ref 6.5–8.1)

## 2015-10-11 LAB — CBG MONITORING, ED: Glucose-Capillary: 475 mg/dL — ABNORMAL HIGH (ref 65–99)

## 2015-10-11 LAB — RAPID STREP SCREEN (MED CTR MEBANE ONLY): STREPTOCOCCUS, GROUP A SCREEN (DIRECT): NEGATIVE

## 2015-10-11 MED ORDER — INSULIN GLARGINE 100 UNIT/ML ~~LOC~~ SOLN: 25.0000 [IU] | Freq: Every day | SUBCUTANEOUS | Status: DC

## 2015-10-11 NOTE — ED Provider Notes (Signed)
CSN: [id number]     Arrival date & time 10/11/15  0946 History   First MD Initiated Contact with Patient 10/11/15 1025     Chief Complaint  Patient presents with  . Sore Throat  . Hyperglycemia    (Consider location/radiation/quality/duration/timing/severity/associated sxs/prior Treatment) Patient is a 20 y.o. female presenting with pharyngitis and hyperglycemia. The history is provided by the patient and medical records. No language interpreter was used.  Sore Throat Associated symptoms include a sore throat. Pertinent negatives include no abdominal pain, chills, congestion, coughing, fever, headaches, nausea, neck pain, rash or vomiting.  Hyperglycemia Associated symptoms: no abdominal pain, no dysuria, no fever, no nausea, no shortness of breath and no vomiting    Mercedes Valdez is a 20 y.o. female  with a PMH of DM2, PCOS, asthma who presents to the Emergency Department complaining of worsening constant sore throat x 3 days. Ibuprofen taken with mild relief. Worse with swallowing and in the morning. No alleviating factors noted. No sick contacts noted. Patient with CBG of 475 at triage - patient states that she had a caramel coffee frapaccino for breakfast this morning shortly before blood sugar was taken. She states that she should be taking 25 units per day at night of Lantus, however it has been over a year since she has had insulin secondary to financial reasons. Denies cough, congestion, shortness of breath, chest pain, abdominal pain, fever, n/v.   Past Medical History  Diagnosis Date  . ADHD (attention deficit hyperactivity disorder)   . Diabetes mellitus     Type 2  . Obesity   . PCOS (polycystic ovarian syndrome)   . Elevated blood pressure   . Asthma    Past Surgical History  Procedure Laterality Date  . External ear surgery      ear tubes   Family History  Problem Relation Age of Onset  . Diabetes Mother   . Vision loss Mother   . ADD / ADHD Brother   .  Allergies Brother   . Asthma Brother   . Vision loss Brother   . Cancer Paternal Grandfather     Colon Cancer  . Heart disease Father 74    died from MI at age 86   Social History  Substance Use Topics  . Smoking status: Current Every Day Smoker -- 0.20 packs/day    Types: Cigarettes  . Smokeless tobacco: None  . Alcohol Use: Yes   OB History    No data available     Review of Systems  Constitutional: Negative for fever and chills.  HENT: Positive for sore throat. Negative for congestion.   Eyes: Negative for visual disturbance.  Respiratory: Negative for cough, shortness of breath and wheezing.   Cardiovascular: Negative.   Gastrointestinal: Negative for nausea, vomiting and abdominal pain.  Genitourinary: Negative for dysuria.  Musculoskeletal: Negative for back pain and neck pain.  Skin: Negative for rash.  Neurological: Negative for headaches.      Allergies  Amoxicillin; Augmentin; Penicillins; and Suprax  Home Medications   Prior to Admission medications   Medication Sig Start Date End Date Taking? Authorizing Provider  ibuprofen (ADVIL,MOTRIN) 800 MG tablet Take 1 tablet (800 mg total) by mouth every 8 (eight) hours as needed. 07/15/15  Yes Christopher Lawyer, PA-C  acyclovir (ZOVIRAX) 400 MG tablet Take 1 tablet (400 mg total) by mouth 3 (three) times daily. 08/08/15   Charlesetta Shanks, MD  albuterol (PROVENTIL HFA;VENTOLIN HFA) 108 (90 BASE) MCG/ACT inhaler Inhale 2 puffs  into the lungs every 4 (four) hours as needed for wheezing. 11/16/13   Karlene Einstein, MD  albuterol (PROVENTIL) (2.5 MG/3ML) 0.083% nebulizer solution Take 3 mLs (2.5 mg total) by nebulization every 4 (four) hours as needed for wheezing or shortness of breath. 11/16/13   Karlene Einstein, MD  cetirizine (ZYRTEC) 10 MG tablet Take 1 tablet (10 mg total) by mouth daily. 09/03/15   Gloriann Loan, PA-C  doxycycline (VIBRAMYCIN) 100 MG capsule Take 1 capsule (100 mg total) by mouth 2 (two) times daily. 07/15/15    Christopher Lawyer, PA-C  fluticasone (FLONASE) 50 MCG/ACT nasal spray Place 2 sprays into both nostrils daily. 09/03/15   Gloriann Loan, PA-C  glucose blood test strip Check blood glucose twice a day 09/06/14   Shruti Simha V, MD  insulin glargine (LANTUS) 100 UNIT/ML injection Inject 0.25 mLs (25 Units total) into the skin at bedtime. 10/11/15   Jaime Pilcher Ward, PA-C  Insulin Pen Needle 31G X 5 MM MISC Use 1 time daily as instructed. 09/06/14   Britt Bottom, NP  Lancets (ACCU-CHEK SOFT TOUCH) lancets Check blood glucose twice a day 09/06/14   Shruti Simha V, MD  lidocaine (XYLOCAINE) 2 % jelly Apply 1 application topically once. 09/06/14   Britt Bottom, NP  metFORMIN (GLUCOPHAGE) 500 MG tablet Take 2 tablets (1,000 mg total) by mouth 2 (two) times daily with a meal. Patient not taking: Reported on 07/15/2015 09/25/14   Shruti [name], MD  omeprazole (PRILOSEC) 20 MG capsule Take 1 capsule (20 mg total) by mouth daily. Patient not taking: Reported on 09/04/2014 11/21/13   Leigh-Anne Cioffredi, MD   BP 138/92 mmHg  Pulse 110  Temp(Src) 97.9 F (36.6 C) (Oral)  Resp 16  SpO2 99% Physical Exam  Constitutional: She is oriented to person, place, and time. She appears well-developed and well-nourished.  Alert and in no acute distress  HENT:  Head: Normocephalic and atraumatic.  OP with erythema and mild tonsillar swelling, no exudates.   Neck: Normal range of motion.  Cardiovascular: Normal rate, regular rhythm and normal heart sounds.   Pulmonary/Chest: Effort normal and breath sounds normal. No respiratory distress. She has no wheezes. She has no rales. She exhibits no tenderness.  Abdominal: Soft. Bowel sounds are normal. She exhibits no distension and no mass. There is no tenderness. There is no rebound and no guarding.  Musculoskeletal: Normal range of motion.  Lymphadenopathy:    She has no cervical adenopathy.  Neurological: She is alert and oriented to person, place, and time.   Skin: Skin is warm and dry.  Cap refill < 3 seconds.   Nursing note and vitals reviewed.   ED Course  Procedures (including critical care time) Labs Review Labs Reviewed  CBC WITH DIFFERENTIAL/PLATELET - Abnormal; Notable for the following:    MCH 25.9 (*)    Platelets 500 (*)    All other components within normal limits  COMPREHENSIVE METABOLIC PANEL - Abnormal; Notable for the following:    Sodium 132 (*)    Chloride 98 (*)    Glucose, Bld 458 (*)    Albumin 3.4 (*)    ALT 13 (*)    All other components within normal limits  CBG MONITORING, ED - Abnormal; Notable for the following:    Glucose-Capillary 475 (*)    All other components within normal limits  RAPID STREP SCREEN (NOT AT [facility])  CULTURE, GROUP A STREP [facility])    Imaging Review No results found. I have personally reviewed  and evaluated these images and lab results as part of my medical decision-making.   EKG Interpretation None      MDM   Final diagnoses:  Pharyngitis  Hyperglycemia   Mercedes Valdez is a 20 y.o. female who presents to ED for sore throat. On exam, OP with erythema but no exudates. Patient is afebrile. Rapid strep negative. Likely viral pharyngitis. Symptomatic home care discussed.   Triage obtained CBG which was 475. Obtained cbc and cmp. CMP with glucose of 458 - patient states this is low for her and that her blood sugars always run in the 500-600 range. Patient also states she drinks 2-3 gatorades daily and had a caramel frappacino for breakfast just PTA. No insulin in the last year 2/2 no insurance and financial reasons. Discussed importance of glycemic control and diet modifications with patient. Patient declined any additional treatment/workup including fluids/insulin. Patient states that she is asymptomatic and does not need anything for her elevated blood sugar. I discussed the long term effects of elevated blood sugar and the risks associated with this. Patient still declines  treatment. Social work came to see patient and discussed PCP follow up and how to get medicaid card changed from pediatrician to an adult physician. I again stressed the importance of PCP follow up with patient and mother at bedside. Wrote rx for home insulin regimen. Return precautions given and all questions answered.   Patient discussed with Dr. Oleta Mouse who agrees with treatment plan.     [facility] Ward, PA-C 10/11/15 1431  Forde Dandy, MD 10/11/15 [phone]

## 2015-10-11 NOTE — ED Notes (Signed)
Provider speaking with patient and family member.

## 2015-10-11 NOTE — Discharge Planning (Signed)
EDCM consulted to assist with PCP referral.  Pt has Medicaid with PCP listed on card.  CSW explained process of changing PCP on Medicaid card or to visit [facility] tomorrow during walk-in clinic hours.

## 2015-10-11 NOTE — ED Notes (Signed)
Patient stated drank coffee frappe prior prior to arrival states is a diabetic and have not taken insulin for over a year.

## 2015-10-11 NOTE — Progress Notes (Signed)
CSW engaged with Patient and Patient's mother at bedside re: needing appt with PCP, history of diabetes (no insulin in one year), and diabetes education. According to Patient's Medicaid card issued on 09/25/15, her PCP is [facility]. Patient's mother reports they were told at the registration desk this morning that Patient only has family planning medicaid. CSW explained that Patient will need to contact [facility] to have her PCP changed. Patient and mother in agreement to call DSS to make that change. CSW also informed Patient and mother that they could go to [place] tomorrow for a Walk In appointment or scheduling an appointment with the [facility] as they take patient's without insurance. Patient's mother very upset during [place] visit expressing that "we have been here since 9:45 this morning. She came here for a sore throat not for her diabetes. We know she has diabetes". Patient's mother continued to state "we are getting ready to just leave. The only reason we are still sitting here is because she took off of work to be here and needs a doctor's excuse note".   CSW emphasized the importance of managing and maintaining diabetes. We discussed that there are potential complications to hyperglycemia including neurologic outcomes or death. CSW also stressed the importance of glycemic control. Patient's CBG was 475 at triage. Patient reports that she should be taking 25 units per day at night of Lantus, however, it has been over a year since she has had insulin secondary to financial reasons. Patient reports that her blood sugars always run in the 500-600 range. Patient's mother reports that Patient has been without medication since she turned 20 about a month ago (previously noted that Patient has been without insulin for a year) because they will no longer see her at [facility] as she has "aged out" and now only has "family  planning medicaid". CSW provided emotional support. CSW signing off as Patient and Patient's mother declined any further resources/treatment. Please contact if new need(s) arise.          Emiliano Dyer, LCSW [facility] ED/31M Clinical Social Worker [phone]

## 2015-10-11 NOTE — ED Notes (Signed)
  CBG 475

## 2015-10-11 NOTE — ED Notes (Signed)
Patient states sore throat x 3 days.   Patient states hyperglycemia, but doesn't take CBG at home.  Patient states "I haven't taken my insulin in over a year because I didn't have insurance.  Now I have medicaid and I should be able to get back on it".   Patient states took ibuprofen at home for sore throat and it helped a little.  Patient was eating a sandwich at triage and drinking a frappe.

## 2015-10-11 NOTE — Discharge Instructions (Signed)
It is very important to follow up with your primary physician at the next available appointment (preferably within one week) for further evaluation and management of your blood sugar.   Diabetes Mellitus and Food It is important for you to manage your blood sugar (glucose) level. Your blood glucose level can be greatly affected by what you eat. Eating healthier foods in the appropriate amounts throughout the day at about the same time each day will help you control your blood glucose level. It can also help slow or prevent worsening of your diabetes mellitus. Healthy eating may even help you improve the level of your blood pressure and reach or maintain a healthy weight.  General recommendations for healthful eating and cooking habits include:  Eating meals and snacks regularly. Avoid going long periods of time without eating to lose weight.  Eating a diet that consists mainly of plant-based foods, such as fruits, vegetables, nuts, legumes, and whole grains.  Using low-heat cooking methods, such as baking, instead of high-heat cooking methods, such as deep frying. Work with your dietitian to make sure you understand how to use the Nutrition Facts information on food labels. HOW CAN FOOD AFFECT ME? Carbohydrates Carbohydrates affect your blood glucose level more than any other type of food. Your dietitian will help you determine how many carbohydrates to eat at each meal and teach you how to count carbohydrates. Counting carbohydrates is important to keep your blood glucose at a healthy level, especially if you are using insulin or taking certain medicines for diabetes mellitus. Alcohol Alcohol can cause sudden decreases in blood glucose (hypoglycemia), especially if you use insulin or take certain medicines for diabetes mellitus. Hypoglycemia can be a life-threatening condition. Symptoms of hypoglycemia (sleepiness, dizziness, and disorientation) are similar to symptoms of having too much alcohol.    If your health care provider has given you approval to drink alcohol, do so in moderation and use the following guidelines:  Women should not have more than one drink per day, and men should not have more than two drinks per day. One drink is equal to:  12 oz of beer.  5 oz of wine.  1 oz of hard liquor.  Do not drink on an empty stomach.  Keep yourself hydrated. Have water, diet soda, or unsweetened iced tea.  Regular soda, juice, and other mixers might contain a lot of carbohydrates and should be counted. WHAT FOODS ARE NOT RECOMMENDED? As you make food choices, it is important to remember that all foods are not the same. Some foods have fewer nutrients per serving than other foods, even though they might have the same number of calories or carbohydrates. It is difficult to get your body what it needs when you eat foods with fewer nutrients. Examples of foods that you should avoid that are high in calories and carbohydrates but low in nutrients include:  Trans fats (most processed foods list trans fats on the Nutrition Facts label).  Regular soda.  Sugary sports drinks such as Gatorade, powerade  Juice.  Candy.  Sweets, such as cake, pie, doughnuts, and cookies.  Fried foods. WHAT FOODS CAN I EAT? Eat nutrient-rich foods, which will nourish your body and keep you healthy. The food you should eat also will depend on several factors, including:  The calories you need.  The medicines you take.  Your weight.  Your blood glucose level.  Your blood pressure level.  Your cholesterol level. You should eat a variety of foods, including:  Protein.  Lean cuts of meat.  Proteins low in saturated fats, such as fish, egg whites, and beans. Avoid processed meats.  Fruits and vegetables.  Fruits and vegetables that may help control blood glucose levels, such as apples, mangoes, and yams.  Dairy products.  Choose fat-free or low-fat dairy products, such as milk, yogurt,  and cheese.  Grains, bread, pasta, and rice.  Choose whole grain products, such as multigrain bread, whole oats, and brown rice. These foods may help control blood pressure.  Fats.  Foods containing healthful fats, such as nuts, avocado, olive oil, canola oil, and fish. DOES EVERYONE WITH DIABETES MELLITUS HAVE THE SAME MEAL PLAN? Because every person with diabetes mellitus is different, there is not one meal plan that works for everyone. It is very important that you meet with a dietitian who will help you create a meal plan that is just right for you.   This information is not intended to replace advice given to you by your health care provider. Make sure you discuss any questions you have with your health care provider.   Document Released: 02/06/2005 Document Revised: 06/02/2014 Document Reviewed: 04/08/2013 Elsevier Interactive Patient Education [employer].

## 2015-10-11 NOTE — ED Notes (Signed)
Social worker at bedside.

## 2015-10-11 NOTE — ED Notes (Signed)
Spoke with social worker who will speak with patient and family member.

## 2015-10-12 LAB — CULTURE, GROUP A STREP (THRC)

## 2015-12-14 ENCOUNTER — Emergency Department (HOSPITAL_COMMUNITY)
Admission: EM | Admit: 2015-12-14 | Discharge: 2015-12-14 | Disposition: A | Discharge status: Home / Self Care | Payer: Self-pay | Attending: Emergency Medicine | Admitting: Emergency Medicine

## 2015-12-14 ENCOUNTER — Encounter (HOSPITAL_COMMUNITY): Payer: Self-pay | Admitting: *Deleted

## 2015-12-14 DIAGNOSIS — E118 Type 2 diabetes mellitus with unspecified complications: Secondary | ICD-10-CM

## 2015-12-14 DIAGNOSIS — Z6839 Body mass index (BMI) 39.0-39.9, adult: Secondary | ICD-10-CM | POA: Insufficient documentation

## 2015-12-14 DIAGNOSIS — Z794 Long term (current) use of insulin: Secondary | ICD-10-CM | POA: Insufficient documentation

## 2015-12-14 DIAGNOSIS — B86 Scabies: Secondary | ICD-10-CM | POA: Insufficient documentation

## 2015-12-14 DIAGNOSIS — E669 Obesity, unspecified: Secondary | ICD-10-CM | POA: Insufficient documentation

## 2015-12-14 DIAGNOSIS — R112 Nausea with vomiting, unspecified: Secondary | ICD-10-CM

## 2015-12-14 DIAGNOSIS — J45909 Unspecified asthma, uncomplicated: Secondary | ICD-10-CM | POA: Insufficient documentation

## 2015-12-14 DIAGNOSIS — Z7984 Long term (current) use of oral hypoglycemic drugs: Secondary | ICD-10-CM | POA: Insufficient documentation

## 2015-12-14 DIAGNOSIS — E1165 Type 2 diabetes mellitus with hyperglycemia: Secondary | ICD-10-CM | POA: Insufficient documentation

## 2015-12-14 DIAGNOSIS — Z9114 Patient's other noncompliance with medication regimen: Secondary | ICD-10-CM | POA: Insufficient documentation

## 2015-12-14 DIAGNOSIS — F17210 Nicotine dependence, cigarettes, uncomplicated: Secondary | ICD-10-CM | POA: Insufficient documentation

## 2015-12-14 LAB — COMPREHENSIVE METABOLIC PANEL
ALBUMIN: 4 g/dL (ref 3.5–5.0)
ALK PHOS: 84 U/L (ref 38–126)
ALT: 16 U/L (ref 14–54)
AST: 15 U/L (ref 15–41)
Anion gap: 11 (ref 5–15)
BILIRUBIN TOTAL: 0.3 mg/dL (ref 0.3–1.2)
BUN: 6 mg/dL (ref 6–20)
CALCIUM: 9.8 mg/dL (ref 8.9–10.3)
CO2: 23 mmol/L (ref 22–32)
Chloride: 98 mmol/L — ABNORMAL LOW (ref 101–111)
Creatinine, Ser: 0.57 mg/dL (ref 0.44–1.00)
GFR calc Af Amer: >60 mL/min (ref >60)
GFR calc non Af Amer: >60 mL/min (ref >60)
GLUCOSE: 382 mg/dL — AB (ref 65–99)
Potassium: 3.9 mmol/L (ref 3.5–5.1)
Sodium: 132 mmol/L — ABNORMAL LOW (ref 135–145)
TOTAL PROTEIN: 8.2 g/dL — AB (ref 6.5–8.1)

## 2015-12-14 LAB — URINALYSIS, ROUTINE W REFLEX MICROSCOPIC
BILIRUBIN URINE: NEGATIVE
Glucose, UA: >1000 mg/dL — AB
Hgb urine dipstick: NEGATIVE
KETONES UR: 15 mg/dL — AB
LEUKOCYTES UA: NEGATIVE
NITRITE: NEGATIVE
PH: 5.5 (ref 5.0–8.0)
Protein, ur: NEGATIVE mg/dL
Specific Gravity, Urine: 1.043 — ABNORMAL HIGH (ref 1.005–1.030)

## 2015-12-14 LAB — CBC
HCT: 40.6 % (ref 36.0–46.0)
Hemoglobin: 13.4 g/dL (ref 12.0–15.0)
MCH: 26.7 pg (ref 26.0–34.0)
MCHC: 33 g/dL (ref 30.0–36.0)
MCV: 81 fL (ref 78.0–100.0)
Platelets: 526 10*3/uL — ABNORMAL HIGH (ref 150–400)
RBC: 5.01 MIL/uL (ref 3.87–5.11)
RDW: 13.5 % (ref 11.5–15.5)
WBC: 10.6 10*3/uL — ABNORMAL HIGH (ref 4.0–10.5)

## 2015-12-14 LAB — CBG MONITORING, ED
GLUCOSE-CAPILLARY: 379 mg/dL — AB (ref 65–99)
Glucose-Capillary: 363 mg/dL — ABNORMAL HIGH (ref 65–99)

## 2015-12-14 LAB — URINE MICROSCOPIC-ADD ON: RBC / HPF: NONE SEEN RBC/hpf (ref 0–5)

## 2015-12-14 LAB — I-STAT BETA HCG BLOOD, ED (MC, WL, AP ONLY)

## 2015-12-14 LAB — LIPASE, BLOOD: Lipase: 22 U/L (ref 11–51)

## 2015-12-14 MED ORDER — PERMETHRIN 5 % EX CREA: TOPICAL_CREAM | CUTANEOUS | Status: DC

## 2015-12-14 MED ORDER — SODIUM CHLORIDE 0.9 % IV SOLN: 1000.0000 mL | INTRAVENOUS | Status: DC

## 2015-12-14 MED ORDER — SODIUM CHLORIDE 0.9 % IV SOLN
1000.0000 mL | Freq: Once | INTRAVENOUS | Status: DC
Start: 2015-12-14 — End: 2015-12-14

## 2015-12-14 NOTE — Discharge Instructions (Signed)
Please see a PCP regaurding your diabetes. Uncontrolled diabetes has numerous impacts on your health which we discussed today. Take all medications as instructed. Please return to ED if your symptoms worsen.   Type 2 Diabetes Mellitus, Adult Type 2 diabetes mellitus, often simply referred to as type 2 diabetes, is a long-lasting (chronic) disease. In type 2 diabetes, the pancreas does not make enough insulin (a hormone), the cells are less responsive to the insulin that is made (insulin resistance), or both. Normally, insulin moves sugars from food into the tissue cells. The tissue cells use the sugars for energy. The lack of insulin or the lack of normal response to insulin causes excess sugars to build up in the blood instead of going into the tissue cells. As a result, high blood sugar (hyperglycemia) develops. The effect of high sugar (glucose) levels can cause many complications. Type 2 diabetes was also previously called adult-onset diabetes, but it can occur at any age.  RISK FACTORS  A person is predisposed to developing type 2 diabetes if someone in the family has the disease and also has one or more of the following primary risk factors:  Weight gain, or being overweight or obese.  An inactive lifestyle.  A history of consistently eating high-calorie foods. Maintaining a normal weight and regular physical activity can reduce the chance of developing type 2 diabetes. SYMPTOMS  A person with type 2 diabetes may not show symptoms initially. The symptoms of type 2 diabetes appear slowly. The symptoms include:  Increased thirst (polydipsia).  Increased urination (polyuria).  Increased urination during the night (nocturia).  Sudden or unexplained weight changes.  Frequent, recurring infections.  Tiredness (fatigue).  Weakness.  Vision changes, such as blurred vision.  Fruity smell to your breath.  Abdominal pain.  Nausea or vomiting.  Cuts or bruises which are slow to  heal.  Tingling or numbness in the hands or feet.  An open skin wound (ulcer). DIAGNOSIS Type 2 diabetes is frequently not diagnosed until complications of diabetes are present. Type 2 diabetes is diagnosed when symptoms or complications are present and when blood glucose levels are increased. Your blood glucose level may be checked by one or more of the following blood tests:  A fasting blood glucose test. You will not be allowed to eat for at least 8 hours before a blood sample is taken.  A random blood glucose test. Your blood glucose is checked at any time of the day regardless of when you ate.  A hemoglobin A1c blood glucose test. A hemoglobin A1c test provides information about blood glucose control over the previous 3 months.  An oral glucose tolerance test (OGTT). Your blood glucose is measured after you have not eaten (fasted) for 2 hours and then after you drink a glucose-containing beverage. TREATMENT   You may need to take insulin or diabetes medicine daily to keep blood glucose levels in the desired range.  If you use insulin, you may need to adjust the dosage depending on the carbohydrates that you eat with each meal or snack.  Lifestyle changes are recommended as part of your treatment. These may include:  Following an individualized diet plan developed by a nutritionist or dietitian.  Exercising daily. Your health care providers will set individualized treatment goals for you based on your age, your medicines, how long you have had diabetes, and any other medical conditions you have. Generally, the goal of treatment is to maintain the following blood glucose levels:  Before meals (preprandial):  80-130 mg/dL.  After meals (postprandial): below 180 mg/dL.  A1c: less than 6.5-7%. HOME CARE INSTRUCTIONS   Have your hemoglobin A1c level checked twice a year.  Perform daily blood glucose monitoring as directed by your health care provider.  Monitor urine ketones when  you are ill and as directed by your health care provider.  Take your diabetes medicine or insulin as directed by your health care provider to maintain your blood glucose levels in the desired range.  Never run out of diabetes medicine or insulin. It is needed every day.  If you are using insulin, you may need to adjust the amount of insulin given based on your intake of carbohydrates. Carbohydrates can raise blood glucose levels but need to be included in your diet. Carbohydrates provide vitamins, minerals, and fiber which are an essential part of a healthy diet. Carbohydrates are found in fruits, vegetables, whole grains, dairy products, legumes, and foods containing added sugars.  Eat healthy foods. You should make an appointment to see a registered dietitian to help you create an eating plan that is right for you.  Lose weight if you are overweight.  Carry a medical alert card or wear your medical alert jewelry.  Carry a 15-gram carbohydrate snack with you at all times to treat low blood glucose (hypoglycemia). Some examples of 15-gram carbohydrate snacks include:  Glucose tablets, 3 or 4.  Glucose gel, 15-gram tube.  Raisins, 2 tablespoons (24 grams).  Jelly beans, 6.  Animal crackers, 8.  Regular pop, 4 ounces (120 mL).  Gummy treats, 9.  Recognize hypoglycemia. Hypoglycemia occurs with blood glucose levels of 70 mg/dL and below. The risk for hypoglycemia increases when fasting or skipping meals, during or after intense exercise, and during sleep. Hypoglycemia symptoms can include:  Tremors or shakes.  Decreased ability to concentrate.  Sweating.  Increased heart rate.  Headache.  Dry mouth.  Hunger.  Irritability.  Anxiety.  Restless sleep.  Altered speech or coordination.  Confusion.  Treat hypoglycemia promptly. If you are alert and able to safely swallow, follow the 15:15 rule:  Take 15-20 grams of rapid-acting glucose or carbohydrate. Rapid-acting  options include glucose gel, glucose tablets, or 4 ounces (120 mL) of fruit juice, regular soda, or low-fat milk.  Check your blood glucose level 15 minutes after taking the glucose.  Take 15-20 grams more of glucose if the repeat blood glucose level is still 70 mg/dL or below.  Eat a meal or snack within 1 hour once blood glucose levels return to normal.  Be alert to feeling very thirsty and urinating more frequently than usual, which are early signs of hyperglycemia. An early awareness of hyperglycemia allows for prompt treatment. Treat hyperglycemia as directed by your health care provider.  Engage in at least 150 minutes of moderate-intensity physical activity a week, spread over at least 3 days of the week or as directed by your health care provider. In addition, you should engage in resistance exercise at least 2 times a week or as directed by your health care provider. Try to spend no more than 90 minutes at one time inactive.  Adjust your medicine and food intake as needed if you start a new exercise or sport.  Follow your sick-day plan anytime you are unable to eat or drink as usual.  Do not use any tobacco products including cigarettes, chewing tobacco, or electronic cigarettes. If you need help quitting, ask your health care provider.  Limit alcohol intake to no more than 1  drink per day for nonpregnant women and 2 drinks per day for men. You should drink alcohol only when you are also eating food. Talk with your health care provider whether alcohol is safe for you. Tell your health care provider if you drink alcohol several times a week.  Keep all follow-up visits as directed by your health care provider. This is important.  Schedule an eye exam soon after the diagnosis of type 2 diabetes and then annually.  Perform daily skin and foot care. Examine your skin and feet daily for cuts, bruises, redness, nail problems, bleeding, blisters, or sores. A foot exam by a health care  provider should be done annually.  Brush your teeth and gums at least twice a day and floss at least once a day. Follow up with your dentist regularly.  Share your diabetes management plan with your workplace or school.  Keep your immunizations up to date. It is recommended that you receive a flu (influenza) vaccine every year. It is also recommended that you receive a pneumonia (pneumococcal) vaccine. If you are 59 years of age or older and have never received a pneumonia vaccine, this vaccine may be given as a series of two separate shots. Ask your health care provider which additional vaccines may be recommended.  Learn to manage stress.  Obtain ongoing diabetes education and support as needed.  Participate in or seek rehabilitation as needed to maintain or improve independence and quality of life. Request a physical or occupational therapy referral if you are having foot or hand numbness, or difficulties with grooming, dressing, eating, or physical activity. SEEK MEDICAL CARE IF:   You are unable to eat food or drink fluids for more than 6 hours.  You have nausea and vomiting for more than 6 hours.  Your blood glucose level is over 240 mg/dL.  There is a change in mental status.  You develop an additional serious illness.  You have diarrhea for more than 6 hours.  You have been sick or have had a fever for a couple of days and are not getting better.  You have pain during any physical activity.  SEEK IMMEDIATE MEDICAL CARE IF:  You have difficulty breathing.  You have moderate to large ketone levels.   This information is not intended to replace advice given to you by your health care provider. Make sure you discuss any questions you have with your health care provider.   Document Released: 05/12/2005 Document Revised: 01/31/2015 Document Reviewed: 12/09/2011 Elsevier Interactive Patient Education 2016 [place] for Diabetes  [place] Carbohydrate counting is a method for keeping track of the amount of carbohydrates you eat. Eating carbohydrates naturally increases the level of sugar (glucose) in your blood, so it is important for you to know the amount that is okay for you to have in every meal. Carbohydrate counting helps keep the level of glucose in your blood within normal limits. The amount of carbohydrates allowed is different for every person. A dietitian can help you calculate the amount that is right for you. Once you know the amount of carbohydrates you can have, you can count the carbohydrates in the foods you want to eat. Carbohydrates are found in the following foods:  Grains, such as breads and cereals.  Dried beans and soy products.  Starchy vegetables, such as potatoes, peas, and corn.  Fruit and fruit juices.  Milk and yogurt.  Sweets and snack foods, such as cake, cookies, candy, chips, soft  drinks, and fruit drinks. CARBOHYDRATE COUNTING There are two ways to count the carbohydrates in your food. You can use either of the methods or a combination of both. Reading the "Nutrition Facts" on [place] The "Nutrition Facts" is an area that is included on the labels of almost all packaged food and beverages in the [place]. It includes the serving size of that food or beverage and information about the nutrients in each serving of the food, including the grams (g) of carbohydrate per serving.  Decide the number of servings of this food or beverage that you will be able to eat or drink. Multiply that number of servings by the number of grams of carbohydrate that is listed on the label for that serving. The total will be the amount of carbohydrates you will be having when you eat or drink this food or beverage. Learning Standard Serving Sizes of Food When you eat food that is not packaged or does not include "Nutrition Facts" on the label, you need to measure the servings in order to count the  amount of carbohydrates.A serving of most carbohydrate-rich foods contains about 15 g of carbohydrates. The following list includes serving sizes of carbohydrate-rich foods that provide 15 g ofcarbohydrate per serving:   1 slice of bread (1 oz) or 1 six-inch tortilla.    of a hamburger bun or English muffin.  4-6 crackers.   cup unsweetened dry cereal.    cup hot cereal.   cup rice or pasta.    cup mashed potatoes or  of a large baked potato.  1 cup fresh fruit or one small piece of fruit.    cup canned or frozen fruit or fruit juice.  1 cup milk.   cup plain fat-free yogurt or yogurt sweetened with artificial sweeteners.   cup cooked dried beans or starchy vegetable, such as peas, corn, or potatoes.  Decide the number of standard-size servings that you will eat. Multiply that number of servings by 15 (the grams of carbohydrates in that serving). For example, if you eat 2 cups of strawberries, you will have eaten 2 servings and 30 g of carbohydrates (2 servings x 15 g = 30 g). For foods such as soups and casseroles, in which more than one food is mixed in, you will need to count the carbohydrates in each food that is included. EXAMPLE OF CARBOHYDRATE COUNTING Sample Dinner  3 oz chicken breast.   cup of brown rice.   cup of corn.  1 cup milk.   1 cup strawberries with sugar-free whipped topping.  Carbohydrate Calculation Step 1: Identify the foods that contain carbohydrates:   Rice.   Corn.   Milk.   Strawberries. Step 2:Calculate the number of servings eaten of each:   2 servings of rice.   1 serving of corn.   1 serving of milk.   1 serving of strawberries. Step 3: Multiply each of those number of servings by 15 g:   2 servings of rice x 15 g = 30 g.   1 serving of corn x 15 g = 15 g.   1 serving of milk x 15 g = 15 g.   1 serving of strawberries x 15 g = 15 g. Step 4: Add together all of the amounts to find the total  grams of carbohydrates eaten: 30 g + 15 g + 15 g + 15 g = 75 g.   This information is not intended to replace advice given to  you by your health care provider. Make sure you discuss any questions you have with your health care provider.   Document Released: 05/12/2005 Document Revised: 06/02/2014 Document Reviewed: 04/08/2013 Elsevier Interactive Patient Education [employer].

## 2015-12-14 NOTE — ED Provider Notes (Signed)
CSN: [id number]     Arrival date & time 12/14/15  1342 History   First MD Initiated Contact with Patient 12/14/15 1502     Chief Complaint  Patient presents with  . Emesis  . Rash     (Consider location/radiation/quality/duration/timing/severity/associated sxs/prior Treatment) HPI   This is a 20 year old female with history of T2DM and PCOS who presents for evaluation of nausea and vomiting that began at approx 12:30pm today when she returned to work from her lunch break. She reports she suddenly felt nauseated and threw up once. She then reported she was "shaking" all over for a brief period. In the ED her nausea and vomiting resolved spontaneously. She reports only some mild lightheadedness at this time. Denies any hematemesis or bilious emesis. She reports she does not take her blood sugar anymore and has taken no medications for diabetes in the past 2 years. She reports she does not follow up with a physician [id number]. When told her blood sugar was 363 she states "thats much lower than it usually is."  She denies any fever, chills, abdominal pain, diarrhea, increased thirst, dysuria, productive cough or headache.   The patient also reports a body rash present since April of this year. The patient reports its exceptionally itchy and benadryl does not relieve her discomfort. She notes its present on her hands, arms and under her breasts.    Past Medical History  Diagnosis Date  . ADHD (attention deficit hyperactivity disorder)   . Diabetes mellitus     Type 2  . Obesity   . PCOS (polycystic ovarian syndrome)   . Elevated blood pressure   . Asthma    Past Surgical History  Procedure Laterality Date  . External ear surgery      ear tubes   Family History  Problem Relation Age of Onset  . Diabetes Mother   . Vision loss Mother   . ADD / ADHD Brother   . Allergies Brother   . Asthma Brother   . Vision loss Brother   . Cancer Paternal Grandfather     Colon Cancer  .  Heart disease Father 80    died from MI at age 92   Social History  Substance Use Topics  . Smoking status: Current Every Day Smoker -- 0.20 packs/day    Types: Cigarettes  . Smokeless tobacco: None  . Alcohol Use: Yes   OB History    No data available     Review of Systems  Constitutional: Negative for fever, chills and fatigue.  HENT: Negative for rhinorrhea, sneezing and sore throat.   Respiratory: Negative for cough, shortness of breath and wheezing.   Cardiovascular: Negative for chest pain and palpitations.  Gastrointestinal: Positive for nausea and vomiting. Negative for abdominal pain, diarrhea, constipation and blood in stool.  Endocrine: Negative for polydipsia and polyuria.  Genitourinary: Negative for dysuria, frequency, hematuria and flank pain.  Musculoskeletal: Negative for myalgias, arthralgias, neck pain and neck stiffness.  Skin: Positive for rash.  Neurological: Positive for light-headedness.      Allergies  Amoxicillin; Augmentin; Penicillins; and Suprax  Home Medications   Prior to Admission medications   Medication Sig Start Date End Date Taking? Authorizing Provider  acyclovir (ZOVIRAX) 400 MG tablet Take 1 tablet (400 mg total) by mouth 3 (three) times daily. 08/08/15   Charlesetta Shanks, MD  albuterol (PROVENTIL HFA;VENTOLIN HFA) 108 (90 BASE) MCG/ACT inhaler Inhale 2 puffs into the lungs every 4 (four) hours as needed for  wheezing. 11/16/13   Karlene Einstein, MD  albuterol (PROVENTIL) (2.5 MG/3ML) 0.083% nebulizer solution Take 3 mLs (2.5 mg total) by nebulization every 4 (four) hours as needed for wheezing or shortness of breath. 11/16/13   Karlene Einstein, MD  cetirizine (ZYRTEC) 10 MG tablet Take 1 tablet (10 mg total) by mouth daily. 09/03/15   Gloriann Loan, PA-C  doxycycline (VIBRAMYCIN) 100 MG capsule Take 1 capsule (100 mg total) by mouth 2 (two) times daily. 07/15/15   Christopher Lawyer, PA-C  fluticasone (FLONASE) 50 MCG/ACT nasal spray Place 2 sprays  into both nostrils daily. 09/03/15   Gloriann Loan, PA-C  glucose blood test strip Check blood glucose twice a day 09/06/14   Ok Edwards, MD  ibuprofen (ADVIL,MOTRIN) 800 MG tablet Take 1 tablet (800 mg total) by mouth every 8 (eight) hours as needed. 07/15/15   Dalia Heading, PA-C  insulin glargine (LANTUS) 100 UNIT/ML injection Inject 0.25 mLs (25 Units total) into the skin at bedtime. 10/11/15   Jaime Pilcher Ward, PA-C  Insulin Pen Needle 31G X 5 MM MISC Use 1 time daily as instructed. 09/06/14   Britt Bottom, NP  Lancets (ACCU-CHEK SOFT TOUCH) lancets Check blood glucose twice a day 09/06/14   Ok Edwards, MD  lidocaine (XYLOCAINE) 2 % jelly Apply 1 application topically once. 09/06/14   Britt Bottom, NP  metFORMIN (GLUCOPHAGE) 500 MG tablet Take 2 tablets (1,000 mg total) by mouth 2 (two) times daily with a meal. Patient not taking: Reported on 07/15/2015 09/25/14   Ok Edwards, MD  omeprazole (PRILOSEC) 20 MG capsule Take 1 capsule (20 mg total) by mouth daily. Patient not taking: Reported on 09/04/2014 11/21/13   Leigh-Anne Cioffredi, MD   BP 137/99 mmHg  Pulse 115  Temp(Src) 98.2 F (36.8 C) (Oral)  Resp 18  Ht 5\' 4"  (1.626 m)  Wt 105.235 kg  BMI 39.80 kg/m2  SpO2 98%  LMP 11/20/2015 Physical Exam  Constitutional: She appears well-developed and well-nourished. No distress.  HENT:  Head: Normocephalic and atraumatic.  Neck: No spinous process tenderness and no muscular tenderness present. No rigidity. No Brudzinski's sign noted.  Cardiovascular: Tachycardia present.   Rate 110  Pulmonary/Chest: Effort normal and breath sounds normal. No respiratory distress. She has no wheezes.  Abdominal: Soft. Bowel sounds are normal. She exhibits no distension. There is no tenderness.  Neurological: She is alert.  Skin: Skin is warm and dry. Rash noted.  Numerous clusters of small pustules, some in linear fashion, present on the arms, under the breasts and on hands. Most  prominent on arms.     ED Course  Procedures (including critical care time) Labs Review Labs Reviewed  COMPREHENSIVE METABOLIC PANEL - Abnormal; Notable for the following:    Sodium 132 (*)    Chloride 98 (*)    Glucose, Bld 382 (*)    Total Protein 8.2 (*)    All other components within normal limits  CBC - Abnormal; Notable for the following:    WBC 10.6 (*)    Platelets 526 (*)    All other components within normal limits  URINALYSIS, ROUTINE W REFLEX MICROSCOPIC (NOT AT [facility]) - Abnormal; Notable for the following:    Specific Gravity, Urine 1.043 (*)    Glucose, UA >1000 (*)    Ketones, ur 15 (*)    All other components within normal limits  URINE MICROSCOPIC-ADD ON - Abnormal; Notable for the following:    Squamous Epithelial / LPF 6-30 (*)  Bacteria, UA RARE (*)    All other components within normal limits  CBG MONITORING, ED - Abnormal; Notable for the following:    Glucose-Capillary 379 (*)    All other components within normal limits  CBG MONITORING, ED - Abnormal; Notable for the following:    Glucose-Capillary 363 (*)    All other components within normal limits  LIPASE, BLOOD  I-STAT BETA HCG BLOOD, ED ([facility], [facility], [facility] ONLY)    Imaging Review No results found. I have personally reviewed and evaluated these images and lab results as part of my medical decision-making.   EKG Interpretation None      MDM   Final diagnoses:  None    Symptoms have resolved in the ED spontaneously, patient denies any abdominal pain or diarrhea. CBC, CMP reviewed, patient has no anion gap.  UA with minimal ketonuria. Patient able to tolerate PO water and will be discharged home. Patient to return if symptoms worsen. She was also counseled on the importance of seeing a physician to get her diabetes under control. She notes she will see a PCP "as soon as she gets insurance." I reminded her there are free diabetes classes here at the hospital.  Patient prescribed topical permethrin  cream and instructed on use.   Shreeya Recendiz, DO 12/14/15 1629  Elnora Morrison, MD 12/15/15 [phone]

## 2015-12-14 NOTE — ED Notes (Signed)
CBG 363 

## 2015-12-14 NOTE — ED Notes (Addendum)
Pt reports rash to entire torso since April and reports itching. This am reports onset of n/v and now having "shaking" all over. No acute distress noted at triage. Reports being diabetic, her meter is broken and hasn't taken her meds in two years.

## 2016-05-28 ENCOUNTER — Encounter (HOSPITAL_COMMUNITY): Payer: Self-pay

## 2016-05-28 ENCOUNTER — Emergency Department (HOSPITAL_COMMUNITY)
Admission: EM | Admit: 2016-05-28 | Discharge: 2016-05-28 | Disposition: A | Discharge status: Home / Self Care | Payer: Self-pay | Attending: Emergency Medicine | Admitting: Emergency Medicine

## 2016-05-28 DIAGNOSIS — A6004 Herpesviral vulvovaginitis: Secondary | ICD-10-CM | POA: Insufficient documentation

## 2016-05-28 DIAGNOSIS — F17210 Nicotine dependence, cigarettes, uncomplicated: Secondary | ICD-10-CM | POA: Insufficient documentation

## 2016-05-28 DIAGNOSIS — L02411 Cutaneous abscess of right axilla: Secondary | ICD-10-CM | POA: Insufficient documentation

## 2016-05-28 DIAGNOSIS — E1165 Type 2 diabetes mellitus with hyperglycemia: Secondary | ICD-10-CM | POA: Insufficient documentation

## 2016-05-28 DIAGNOSIS — I10 Essential (primary) hypertension: Secondary | ICD-10-CM | POA: Insufficient documentation

## 2016-05-28 DIAGNOSIS — J45909 Unspecified asthma, uncomplicated: Secondary | ICD-10-CM | POA: Insufficient documentation

## 2016-05-28 DIAGNOSIS — R739 Hyperglycemia, unspecified: Secondary | ICD-10-CM

## 2016-05-28 DIAGNOSIS — Z794 Long term (current) use of insulin: Secondary | ICD-10-CM | POA: Insufficient documentation

## 2016-05-28 LAB — CBG MONITORING, ED: Glucose-Capillary: 351 mg/dL — ABNORMAL HIGH (ref 65–99)

## 2016-05-28 MED ORDER — LIDOCAINE-EPINEPHRINE (PF) 2 %-1:200000 IJ SOLN
20.0000 mL | Freq: Once | INTRAMUSCULAR | Status: AC
  Administered 2016-05-28: 20 mL
  Filled 2016-05-28: qty 20

## 2016-05-28 MED ORDER — SODIUM CHLORIDE 0.9 % IV BOLUS (SEPSIS): 1000.0000 mL | Freq: Once | INTRAVENOUS | Status: DC

## 2016-05-28 MED ORDER — ACYCLOVIR 400 MG PO TABS: 400.0000 mg | ORAL_TABLET | Freq: Three times a day (TID) | ORAL | 0 refills | Status: DC

## 2016-05-28 MED ORDER — METFORMIN HCL 500 MG PO TABS
1000.0000 mg | ORAL_TABLET | Freq: Once | ORAL | Status: DC
  Filled 2016-05-28: qty 2

## 2016-05-28 NOTE — ED Provider Notes (Addendum)
[facility] DEPT Provider Note   CSN: [id number] Arrival date & time: 05/28/16  1132   By signing my name below, I, Mercedes Valdez, attest that this documentation has been prepared under the direction and in the presence of [name], MD. Electronically Signed: Collene Valdez, Scribe. 05/28/16. 12:42 PM.  Charting resumed from [facility] by Julien Nordmann at 3:20 pm. 05/28/16  History   Chief Complaint Chief Complaint  Patient presents with  . Abscess   HPI Comments: Mercedes Valdez is a 21 y.o. female with a history of DM type 2, HTN, and tobacco abuse, who presents to the Emergency Department complaining of a gradual-onset, right arm boil that began a week and a half ago. Patient states this is a recurrent episode. She has not tried any modifying factors. Patient reports the only thing she takes is metformin. She does not take any insulin for her diabetes because the cost is too expensive. She denies any fever, drainage, or redness.   HPI  Pt further reports being diagnosed with herpes about 1 year ago. She notes having a breakout but was prescribed acyclovir which she notes alleviated her symptoms. Pt reports noticing a breakout about one week ago. She has not been sexually active for the past few months.   Past Medical History:  Diagnosis Date  . ADHD (attention deficit hyperactivity disorder)   . Asthma   . Diabetes mellitus    Type 2  . Elevated blood pressure   . Obesity   . PCOS (polycystic ovarian syndrome)     Patient Active Problem List   Diagnosis Date Noted  . Psychosocial stressors 09/26/2014  . Insulin dependent type 2 diabetes mellitus ([place]) 09/06/2014  . Bacterial vaginosis 09/05/2014  . STI (sexually transmitted infection) 09/05/2014  . Multiple drug allergies 02/27/2014  . Musculoskeletal pain 04/08/2013  . Unspecified gastritis and gastroduodenitis without mention of hemorrhage 04/08/2013  . Type 2 diabetes mellitus ([place]) 04/08/2013  .  Irregular menses 04/08/2013  . Dysfunctional uterine bleeding 11/09/2012  . PCOS (polycystic ovarian syndrome) 09/29/2012  . Asthma     Past Surgical History:  Procedure Laterality Date  . EXTERNAL EAR SURGERY     ear tubes    OB History    No data available       Home Medications    Prior to Admission medications   Medication Sig Start Date End Date Taking? Authorizing Provider  acyclovir (ZOVIRAX) 400 MG tablet Take 1 tablet (400 mg total) by mouth 3 (three) times daily. 08/08/15   Charlesetta Shanks, MD  albuterol (PROVENTIL HFA;VENTOLIN HFA) 108 (90 BASE) MCG/ACT inhaler Inhale 2 puffs into the lungs every 4 (four) hours as needed for wheezing. 11/16/13   Karlene Einstein, MD  albuterol (PROVENTIL) (2.5 MG/3ML) 0.083% nebulizer solution Take 3 mLs (2.5 mg total) by nebulization every 4 (four) hours as needed for wheezing or shortness of breath. 11/16/13   Karlene Einstein, MD  cetirizine (ZYRTEC) 10 MG tablet Take 1 tablet (10 mg total) by mouth daily. 09/03/15   Gloriann Loan, PA-C  doxycycline (VIBRAMYCIN) 100 MG capsule Take 1 capsule (100 mg total) by mouth 2 (two) times daily. 07/15/15   Christopher Lawyer, PA-C  fluticasone (FLONASE) 50 MCG/ACT nasal spray Place 2 sprays into both nostrils daily. 09/03/15   Gloriann Loan, PA-C  glucose blood test strip Check blood glucose twice a day 09/06/14   Ok Edwards, MD  ibuprofen (ADVIL,MOTRIN) 800 MG tablet Take 1 tablet (800 mg total) by mouth every  8 (eight) hours as needed. 07/15/15   Dalia Heading, PA-C  insulin glargine (LANTUS) 100 UNIT/ML injection Inject 0.25 mLs (25 Units total) into the skin at bedtime. 10/11/15   Jaime Pilcher Ward, PA-C  Insulin Pen Needle 31G X 5 MM MISC Use 1 time daily as instructed. 09/06/14   Britt Bottom, NP  Lancets (ACCU-CHEK SOFT TOUCH) lancets Check blood glucose twice a day 09/06/14   Ok Edwards, MD  lidocaine (XYLOCAINE) 2 % jelly Apply 1 application topically once. 09/06/14   Britt Bottom, NP    metFORMIN (GLUCOPHAGE) 500 MG tablet Take 2 tablets (1,000 mg total) by mouth 2 (two) times daily with a meal. Patient not taking: Reported on 07/15/2015 09/25/14   Ok Edwards, MD  omeprazole (PRILOSEC) 20 MG capsule Take 1 capsule (20 mg total) by mouth daily. Patient not taking: Reported on 09/04/2014 11/21/13   Janelle Floor, MD  permethrin (ELIMITE) 5 % cream Apply to affected area once daily x 7 days. May repeat in 14 days. 12/14/15   Bethany Molt, DO    Family History Family History  Problem Relation Age of Onset  . Diabetes Mother   . Vision loss Mother   . ADD / ADHD Brother   . Allergies Brother   . Asthma Brother   . Vision loss Brother   . Cancer Paternal Grandfather     Colon Cancer  . Heart disease Father 12    died from MI at age 51    Social History Social History  Substance Use Topics  . Smoking status: Current Every Day Smoker    Packs/day: 0.20    Types: Cigarettes  . Smokeless tobacco: Not on file  . Alcohol use Yes     Allergies   Amoxicillin; Augmentin [amoxicillin-pot clavulanate]; Penicillins; and Suprax [cefixime]   Review of Systems Review of Systems   Physical Exam Updated Vital Signs BP 156/97 (BP Location: Left Arm)   Pulse 107   Temp 97.5 F (36.4 C) (Oral)   Resp 18   Ht 5' 4.5" (1.638 m)   Wt 230 lb (104.3 kg)   LMP 05/20/2016   SpO2 100%   BMI 38.87 kg/m   Physical Exam  Constitutional: She is oriented to person, place, and time. She appears well-nourished. No distress.  Morbidly obese  HENT:  Head: Normocephalic and atraumatic.  Right Ear: External ear normal.  Left Ear: External ear normal.  Nose: Nose normal.  Mouth/Throat: Oropharynx is clear and moist.  Eyes: Conjunctivae and EOM are normal. Pupils are equal, round, and reactive to light.  Cardiovascular: Normal rate, regular rhythm and normal heart sounds.   Pulmonary/Chest: Effort normal and breath sounds normal.  Abdominal: Soft.  Genitourinary:   Genitourinary Comments: Red, excoriated, has yellow-green discharge diffusely, cervix is clear  Musculoskeletal: Normal range of motion.  Neurological: She is alert and oriented to person, place, and time.  Skin: Skin is warm. Capillary refill takes less than 2 seconds.  3 x 4 cm fluctuant area right axilla with proximal fluctuant area  Nursing note and vitals reviewed.    ED Treatments / Results  DIAGNOSTIC STUDIES: Oxygen Saturation is 100% on RA, normal by my interpretation.    COORDINATION OF CARE: 12:47 PM Discussed treatment plan with pt at bedside and pt agreed to plan.  Labs (all labs ordered are listed, but only abnormal results are displayed) Labs Reviewed - No data to display  EKG  EKG Interpretation None  Radiology No results found.  Procedures Pelvic exam Date/Time: 05/28/2016 3:18 PM Performed by: [name] Authorized by: [name]  Consent: Verbal consent obtained. Written consent not obtained. Risks and benefits: risks, benefits and alternatives were discussed Consent given by: patient Patient understanding: patient states understanding of the procedure being performed Patient consent: the patient's understanding of the procedure matches consent given Procedure consent: procedure consent matches procedure scheduled Relevant documents: relevant documents not present or verified Test results: test results available and properly labeled Site marked: the operative site was marked Imaging studies: imaging studies available Required items: required blood products, implants, devices, and special equipment available Patient identity confirmed: verbally with patient Preparation: Patient was prepped and draped in the usual sterile fashion. Local anesthesia used: no  Anesthesia: Local anesthesia used: no  Sedation: Patient sedated: no Patient tolerance: Patient tolerated the procedure well with no immediate complications Comments: Red,  excoriated, has yellow-green discharge diffusely, cervix is clear    (including critical care time)  Medications Ordered in ED Medications - No data to display   Initial Impression / Assessment and Plan / ED Course  I have reviewed the triage vital signs and the nursing notes.  Pertinent labs & imaging results that were available during my care of the patient were reviewed by me and considered in my medical decision making (see chart for details).  Clinical Course   See I and D note by Jeannett Senior Hyperglycemia discussed with patient need to take her insulin. She refuses further labs to evaluate for DKA. I have attempted to speak with her regarding treatment of her diabetes and need for blood sugar control but she refuses further counseling. She does states that she has herpes and need acyclovir. Take this assessment ongoing problem. Apparent for pelvic exam. 2:33 PM   Final Clinical Impressions(s) / ED Diagnoses   Final diagnoses:  Abscess of axilla, right  Hyperglycemia  Herpes simplex vulvovaginitis    New Prescriptions New Prescriptions   No medications on file   I personally performed the services described in this documentation, which was scribed in my presence. The recorded information has been reviewed and considered.     [name], MD 06/06/16 1653    [name], MD 06/06/16 [id number]

## 2016-05-28 NOTE — Discharge Instructions (Signed)
Please follow up with primary doctor for further treatment of diabetes and take all medicine as prescribed.

## 2016-05-28 NOTE — ED Triage Notes (Signed)
Rt.under arm abscess. Hx. Of abscesses . Developed over a week ago and pt. Using warm compresses.  Pt. Reports that it is larger and denies any fever.   Denies any n/v/d  The abscess has not opened.

## 2016-05-28 NOTE — ED Provider Notes (Signed)
INCISION AND DRAINAGE Performed by: Jeannett Senior A Consent: Verbal consent obtained. Risks and benefits: risks, benefits and alternatives were discussed Type: abscess  Body area: right axilla  Anesthesia: local infiltration  Incision was made with a scalpel.  Local anesthetic: lidocaine 2% w epinephrine  Anesthetic total: 6 ml  Complexity: complex Blunt dissection to break up loculations  Drainage: purulent  Drainage amount: copious  Packing material: 1/4 in iodoform gauze  Patient tolerance: Patient tolerated the procedure well with no immediate complications.      Jeannett Senior, PA-C 05/28/16 1334    [name], MD 05/28/16 2049

## 2016-05-28 NOTE — ED Notes (Signed)
Pt CBG was 351, notified John(RN)

## 2017-07-07 ENCOUNTER — Emergency Department (HOSPITAL_COMMUNITY)
Admission: EM | Admit: 2017-07-07 | Discharge: 2017-07-07 | Disposition: A | Discharge status: Home / Self Care | Payer: Self-pay | Attending: Emergency Medicine | Admitting: Emergency Medicine

## 2017-07-07 ENCOUNTER — Encounter (HOSPITAL_COMMUNITY): Payer: Self-pay | Admitting: *Deleted

## 2017-07-07 ENCOUNTER — Other Ambulatory Visit: Payer: Self-pay

## 2017-07-07 ENCOUNTER — Emergency Department (HOSPITAL_COMMUNITY): Payer: Self-pay

## 2017-07-07 DIAGNOSIS — Z794 Long term (current) use of insulin: Secondary | ICD-10-CM | POA: Insufficient documentation

## 2017-07-07 DIAGNOSIS — Z79899 Other long term (current) drug therapy: Secondary | ICD-10-CM | POA: Insufficient documentation

## 2017-07-07 DIAGNOSIS — E119 Type 2 diabetes mellitus without complications: Secondary | ICD-10-CM | POA: Insufficient documentation

## 2017-07-07 DIAGNOSIS — R5381 Other malaise: Secondary | ICD-10-CM

## 2017-07-07 DIAGNOSIS — R059 Cough, unspecified: Secondary | ICD-10-CM

## 2017-07-07 DIAGNOSIS — F17210 Nicotine dependence, cigarettes, uncomplicated: Secondary | ICD-10-CM | POA: Insufficient documentation

## 2017-07-07 DIAGNOSIS — R5383 Other fatigue: Secondary | ICD-10-CM

## 2017-07-07 DIAGNOSIS — F909 Attention-deficit hyperactivity disorder, unspecified type: Secondary | ICD-10-CM | POA: Insufficient documentation

## 2017-07-07 DIAGNOSIS — J111 Influenza due to unidentified influenza virus with other respiratory manifestations: Secondary | ICD-10-CM

## 2017-07-07 DIAGNOSIS — J45909 Unspecified asthma, uncomplicated: Secondary | ICD-10-CM | POA: Insufficient documentation

## 2017-07-07 DIAGNOSIS — J101 Influenza due to other identified influenza virus with other respiratory manifestations: Secondary | ICD-10-CM | POA: Insufficient documentation

## 2017-07-07 DIAGNOSIS — R05 Cough: Secondary | ICD-10-CM

## 2017-07-07 LAB — COMPREHENSIVE METABOLIC PANEL
ALBUMIN: 3.7 g/dL (ref 3.5–5.0)
ALK PHOS: 84 U/L (ref 38–126)
ALT: 16 U/L (ref 14–54)
AST: 17 U/L (ref 15–41)
Anion gap: 14 (ref 5–15)
BILIRUBIN TOTAL: 0.6 mg/dL (ref 0.3–1.2)
BUN: 6 mg/dL (ref 6–20)
CALCIUM: 9.3 mg/dL (ref 8.9–10.3)
CO2: 21 mmol/L — ABNORMAL LOW (ref 22–32)
CREATININE: 0.59 mg/dL (ref 0.44–1.00)
Chloride: 97 mmol/L — ABNORMAL LOW (ref 101–111)
GFR calc Af Amer: >60 mL/min (ref >60)
GLUCOSE: 344 mg/dL — AB (ref 65–99)
POTASSIUM: 3.8 mmol/L (ref 3.5–5.1)
Sodium: 132 mmol/L — ABNORMAL LOW (ref 135–145)
TOTAL PROTEIN: 7.6 g/dL (ref 6.5–8.1)

## 2017-07-07 LAB — INFLUENZA PANEL BY PCR (TYPE A & B)
INFLAPCR: POSITIVE — AB
Influenza B By PCR: NEGATIVE

## 2017-07-07 LAB — CBC WITH DIFFERENTIAL/PLATELET
BASOS ABS: 0 10*3/uL (ref 0.0–0.1)
Basophils Relative: 0 %
EOS ABS: 0 10*3/uL (ref 0.0–0.7)
EOS PCT: 0 %
HCT: 38.6 % (ref 36.0–46.0)
Hemoglobin: 12.7 g/dL (ref 12.0–15.0)
LYMPHS PCT: 13 %
Lymphs Abs: 1 10*3/uL (ref 0.7–4.0)
MCH: 26.3 pg (ref 26.0–34.0)
MCHC: 32.9 g/dL (ref 30.0–36.0)
MCV: 80.1 fL (ref 78.0–100.0)
MONO ABS: 0.5 10*3/uL (ref 0.1–1.0)
Monocytes Relative: 6 %
Neutro Abs: 6.7 10*3/uL (ref 1.7–7.7)
Neutrophils Relative %: 81 %
PLATELETS: 478 10*3/uL — AB (ref 150–400)
RBC: 4.82 MIL/uL (ref 3.87–5.11)
RDW: 13.9 % (ref 11.5–15.5)
WBC: 8.2 10*3/uL (ref 4.0–10.5)

## 2017-07-07 LAB — URINALYSIS, ROUTINE W REFLEX MICROSCOPIC
Bilirubin Urine: NEGATIVE
Hgb urine dipstick: NEGATIVE
Ketones, ur: 20 mg/dL — AB
LEUKOCYTES UA: NEGATIVE
Nitrite: NEGATIVE
PH: 5 (ref 5.0–8.0)
Protein, ur: NEGATIVE mg/dL
SPECIFIC GRAVITY, URINE: 1.032 — AB (ref 1.005–1.030)

## 2017-07-07 LAB — I-STAT BETA HCG BLOOD, ED (MC, WL, AP ONLY): I-stat hCG, quantitative: <5 m[IU]/mL (ref <5)

## 2017-07-07 LAB — I-STAT CG4 LACTIC ACID, ED: Lactic Acid, Venous: 1.84 mmol/L (ref 0.5–1.9)

## 2017-07-07 MED ORDER — PROCHLORPERAZINE MALEATE 10 MG PO TABS: 10.0000 mg | ORAL_TABLET | Freq: Two times a day (BID) | ORAL | 0 refills | Status: DC | PRN

## 2017-07-07 MED ORDER — OSELTAMIVIR PHOSPHATE 75 MG PO CAPS
75.0000 mg | ORAL_CAPSULE | Freq: Once | ORAL | Status: AC
  Administered 2017-07-07: 75 mg via ORAL
  Filled 2017-07-07: qty 1

## 2017-07-07 MED ORDER — PROCHLORPERAZINE EDISYLATE 5 MG/ML IJ SOLN
10.0000 mg | Freq: Once | INTRAMUSCULAR | Status: AC
  Administered 2017-07-07: 10 mg via INTRAVENOUS
  Filled 2017-07-07: qty 2

## 2017-07-07 MED ORDER — OSELTAMIVIR PHOSPHATE 75 MG PO CAPS: 75.0000 mg | ORAL_CAPSULE | Freq: Two times a day (BID) | ORAL | 0 refills | Status: DC

## 2017-07-07 MED ORDER — SODIUM CHLORIDE 0.9 % IV BOLUS (SEPSIS)
1000.0000 mL | Freq: Once | INTRAVENOUS | Status: AC
  Administered 2017-07-07: 1000 mL via INTRAVENOUS

## 2017-07-07 MED ORDER — DIPHENHYDRAMINE HCL 50 MG/ML IJ SOLN
25.0000 mg | Freq: Once | INTRAMUSCULAR | Status: AC
  Administered 2017-07-07: 25 mg via INTRAVENOUS
  Filled 2017-07-07: qty 1

## 2017-07-07 NOTE — ED Notes (Signed)
Pt family in hall yelling at this RN asking when we are letting them go because they have a young child in the room and have been waiting on results since 4:30. I explained to the pt that  Did not swab the pt for the flu until 5pm and it takes time for the result and apologized for delay. Family also upset stating "weve been here all day and y'all ain't offered [place] nothing"  At this time I asked if I could bring them anything and she stated "papers".   Pt family also iinformed that children under age 84 are not permitted in ED at this time d/t widespread flu.   Dr. Sherry Ruffing made aware.

## 2017-07-07 NOTE — ED Provider Notes (Signed)
[facility] EMERGENCY DEPARTMENT Provider Note   CSN: [id number] Arrival date & time: 07/07/17  1211     History   Chief Complaint Chief Complaint  Patient presents with  . Cough    HPI Mercedes Valdez is a 22 y.o. female.  The history is provided by the patient, a parent, a relative and medical records. No language interpreter was used.  URI   This is a new problem. The current episode started yesterday. The problem has been gradually worsening. The fever has been present for 1 to 2 days. Associated symptoms include chest pain (rtightness worsened with coughing), nausea, vomiting, congestion, headaches, rhinorrhea and cough. Pertinent negatives include no abdominal pain, no diarrhea, no dysuria, no plugged ear sensation, no rash and no wheezing. She has tried an inhaler for the symptoms. The treatment provided no relief.    Past Medical History:  Diagnosis Date  . ADHD (attention deficit hyperactivity disorder)   . Asthma   . Diabetes mellitus    Type 2  . Elevated blood pressure   . Obesity   . PCOS (polycystic ovarian syndrome)     Patient Active Problem List   Diagnosis Date Noted  . Psychosocial stressors 09/26/2014  . Insulin dependent type 2 diabetes mellitus ([place]) 09/06/2014  . Bacterial vaginosis 09/05/2014  . STI (sexually transmitted infection) 09/05/2014  . Multiple drug allergies 02/27/2014  . Musculoskeletal pain 04/08/2013  . Unspecified gastritis and gastroduodenitis without mention of hemorrhage 04/08/2013  . Type 2 diabetes mellitus ([place]) 04/08/2013  . Irregular menses 04/08/2013  . Dysfunctional uterine bleeding 11/09/2012  . PCOS (polycystic ovarian syndrome) 09/29/2012  . Asthma     Past Surgical History:  Procedure Laterality Date  . EXTERNAL EAR SURGERY     ear tubes    OB History    No data available       Home Medications    Prior to Admission medications   Medication Sig Start Date End Date Taking?  Authorizing Provider  acyclovir (ZOVIRAX) 400 MG tablet Take 1 tablet (400 mg total) by mouth 3 (three) times daily. 05/28/16   [name], MD  albuterol (PROVENTIL HFA;VENTOLIN HFA) 108 (90 BASE) MCG/ACT inhaler Inhale 2 puffs into the lungs every 4 (four) hours as needed for wheezing. 11/16/13   Karlene Einstein, MD  albuterol (PROVENTIL) (2.5 MG/3ML) 0.083% nebulizer solution Take 3 mLs (2.5 mg total) by nebulization every 4 (four) hours as needed for wheezing or shortness of breath. 11/16/13   Karlene Einstein, MD  cetirizine (ZYRTEC) 10 MG tablet Take 1 tablet (10 mg total) by mouth daily. 09/03/15   Gloriann Loan, PA-C  doxycycline (VIBRAMYCIN) 100 MG capsule Take 1 capsule (100 mg total) by mouth 2 (two) times daily. 07/15/15   Lawyer, Harrell Gave, PA-C  fluticasone (FLONASE) 50 MCG/ACT nasal spray Place 2 sprays into both nostrils daily. 09/03/15   Gloriann Loan, PA-C  glucose blood test strip Check blood glucose twice a day 09/06/14   Ok Edwards, MD  ibuprofen (ADVIL,MOTRIN) 800 MG tablet Take 1 tablet (800 mg total) by mouth every 8 (eight) hours as needed. 07/15/15   Lawyer, Harrell Gave, PA-C  insulin glargine (LANTUS) 100 UNIT/ML injection Inject 0.25 mLs (25 Units total) into the skin at bedtime. 10/11/15   Ward, Ozella Almond, PA-C  Insulin Pen Needle 31G X 5 MM MISC Use 1 time daily as instructed. 09/06/14   Britt Bottom, NP  Lancets [facility]) lancets Check blood glucose twice a day 09/06/14  Simha, Shruti V, MD  lidocaine (XYLOCAINE) 2 % jelly Apply 1 application topically once. 09/06/14   Britt Bottom, NP  metFORMIN (GLUCOPHAGE) 500 MG tablet Take 2 tablets (1,000 mg total) by mouth 2 (two) times daily with a meal. Patient not taking: Reported on 07/15/2015 09/25/14   Ok Edwards, MD  omeprazole (PRILOSEC) 20 MG capsule Take 1 capsule (20 mg total) by mouth daily. Patient not taking: Reported on 09/04/2014 11/21/13   Cioffredi, Eulis Canner, MD  permethrin (ELIMITE) 5 %  cream Apply to affected area once daily x 7 days. May repeat in 14 days. 12/14/15   Molt, Bethany, DO    Family History Family History  Problem Relation Age of Onset  . Diabetes Mother   . Vision loss Mother   . ADD / ADHD Brother   . Allergies Brother   . Asthma Brother   . Vision loss Brother   . Cancer Paternal Grandfather        Colon Cancer  . Heart disease Father 42       died from MI at age 43    Social History Social History   Tobacco Use  . Smoking status: Current Every Day Smoker    Packs/day: 0.20    Types: Cigarettes  Substance Use Topics  . Alcohol use: Yes  . Drug use: Yes    Types: Marijuana     Allergies   Amoxicillin; Augmentin [amoxicillin-pot clavulanate]; Penicillins; and Suprax [cefixime]   Review of Systems Review of Systems  Constitutional: Positive for chills, fatigue and fever. Negative for diaphoresis.  HENT: Positive for congestion and rhinorrhea.   Eyes: Negative for visual disturbance.  Respiratory: Positive for cough and chest tightness. Negative for choking, shortness of breath, wheezing and stridor.   Cardiovascular: Positive for chest pain (rtightness worsened with coughing). Negative for palpitations and leg swelling.  Gastrointestinal: Positive for nausea and vomiting. Negative for abdominal pain, blood in stool, constipation and diarrhea.  Genitourinary: Negative for dysuria and flank pain.  Musculoskeletal: Negative for back pain.  Skin: Negative for rash.  Neurological: Positive for headaches. Negative for seizures and light-headedness.  Psychiatric/Behavioral: Negative for agitation.  All other systems reviewed and are negative.    Physical Exam Updated Vital Signs BP 125/80 (BP Location: Left Arm)   Pulse (!) 114   Temp 98.9 F (37.2 C) (Oral)   Resp 16   LMP 06/29/2017   SpO2 99%   Physical Exam  Constitutional: She is oriented to person, place, and time. She appears well-developed and well-nourished. No distress.   HENT:  Head: Normocephalic.  Nose: Rhinorrhea present.  Mouth/Throat: Oropharynx is clear and moist. No oropharyngeal exudate.  Eyes: Conjunctivae and EOM are normal. Pupils are equal, round, and reactive to light.  Neck: Normal range of motion. Neck supple.  Cardiovascular: Normal heart sounds and intact distal pulses. Tachycardia present.  No murmur heard. Pulmonary/Chest: Breath sounds normal. No stridor. Tachypnea noted. No respiratory distress. She has no wheezes. She exhibits no tenderness.  Abdominal: Soft. Bowel sounds are normal. She exhibits no distension. There is no tenderness.  Musculoskeletal: She exhibits no edema or tenderness.  Lymphadenopathy:    She has no cervical adenopathy.  Neurological: She is alert and oriented to person, place, and time. No sensory deficit. She exhibits normal muscle tone.  Skin: Capillary refill takes less than 2 seconds. No rash noted. She is not diaphoretic. No erythema.  Psychiatric: She has a normal mood and affect.  Nursing note and vitals reviewed.  ED Treatments / Results  Labs (all labs ordered are listed, but only abnormal results are displayed) Labs Reviewed  COMPREHENSIVE METABOLIC PANEL - Abnormal; Notable for the following components:      Result Value   Sodium 132 (*)    Chloride 97 (*)    CO2 21 (*)    Glucose, Bld 344 (*)    All other components within normal limits  CBC WITH DIFFERENTIAL/PLATELET - Abnormal; Notable for the following components:   Platelets 478 (*)    All other components within normal limits  URINALYSIS, ROUTINE W REFLEX MICROSCOPIC - Abnormal; Notable for the following components:   APPearance HAZY (*)    Specific Gravity, Urine 1.032 (*)    Glucose, UA >=500 (*)    Ketones, ur 20 (*)    Bacteria, UA RARE (*)    Squamous Epithelial / LPF 0-5 (*)    All other components within normal limits  INFLUENZA PANEL BY PCR (TYPE A & B) - Abnormal; Notable for the following components:   Influenza A By  PCR POSITIVE (*)    All other components within normal limits  I-STAT CG4 LACTIC ACID, ED  I-STAT BETA HCG BLOOD, ED ([facility], [facility], [facility] ONLY)    EKG  EKG Interpretation None       Radiology Dg Chest 2 View  Result Date: 07/07/2017 CLINICAL DATA:  Shortness of breath, cough, fever, chest pain and body aches for 2 days, history asthma, diabetes mellitus, former smoker EXAM: CHEST  2 VIEW COMPARISON:  09/03/2015 FINDINGS: Mild enlargement of cardiac silhouette. Mediastinal contours and pulmonary vascularity normal. Bronchitic changes without infiltrate, pleural effusion, or pneumothorax. No acute osseous findings. IMPRESSION: Mild enlargement of cardiac silhouette and bronchitic changes without infiltrate. Electronically Signed   By: Lavonia Dana M.D.   On: 07/07/2017 14:36    Procedures Procedures (including critical care time)  Medications Ordered in ED Medications  sodium chloride 0.9 % bolus 1,000 mL (0 mLs Intravenous Stopped 07/07/17 1746)  prochlorperazine (COMPAZINE) injection 10 mg (10 mg Intravenous Given 07/07/17 1658)  diphenhydrAMINE (BENADRYL) injection 25 mg (25 mg Intravenous Given 07/07/17 1658)  oseltamivir (TAMIFLU) capsule 75 mg (75 mg Oral Given 07/07/17 1927)     Initial Impression / Assessment and Plan / ED Course  I have reviewed the triage vital signs and the nursing notes.  Pertinent labs & imaging results that were available during my care of the patient were reviewed by me and considered in my medical decision making (see chart for details).     Mercedes Valdez is a 22 y.o. female with a past medical history significant for asthma, diabetes, PCOS, and elevated blood pressure who presents with 2 days of myalgias, generalized aching, congestion, fevers, cough, nausea, vomiting, and decreased oral intake.  Patient is brought in by family who reports that patient had symptoms beginning yesterday.  She reports that she has had sick contacts at work.  She describes her  cough being the initial symptom and has been dry.  She reports some chest tightness with her coughing.  She took albuterol at home which did not help her symptoms.  She reports general myalgias and aches have been ongoing today.  She reports that she has not had as much to eat or drink today with her nausea and occasional vomiting.  She denies any urinary symptoms, constipation, or diarrhea.  She feels warm but did not take her temperature at home.  She denies any other complaints on arrival or other  medication changes.  On exam, patient has audible congestion and rhinorrhea.  Patient's oropharynx is unremarkable.  Normal neck range of motion with no neck stiffness or tenderness.  Lungs were clear to auscultation bilaterally and there is no chest tenderness or back tenderness.  Abdomen was nontender.  Patient no focal neurologic deficits.  Minimal edema seen in the legs.  Patient was found to be tachycardic, nearly febrile, tachypneic in triage.  I suspect patient has a viral illness causing her symptoms however diagnostic laboratory testing and imaging was ordered as well.  Laboratory testing showed evidence of dehydration with no evidence of UTI.  Laboratory testing otherwise reassuring.  Influenza test will be added given high concern for flu.  X-ray showed no evidence of pneumonia.  Patient will be given medications for her aching headache as well as fluid for rehydration.  Anticipate reassessment after influenza test returns and patient finishes her fluids.  I suspect patient will be safe for discharge home on reassessment.  Influenza test took a significant amount of time to process however it returned positive with influenza A.    Patient given dose of Tamiflu and will be discharged with Tamiflu.  Patient reports her headache had improved after Compazine.  She will be given prescription for Compazine.  Good Rx prescriptions were printed to help save the patient money.    Patient given  instructions to follow-up with a PCP as well as return precautions for worsening symptoms.  Patient was feeling better and was discharged in good condition.    Final Clinical Impressions(s) / ED Diagnoses   Final diagnoses:  Cough  Influenza  Malaise and fatigue    ED Discharge Orders        Ordered    oseltamivir (TAMIFLU) 75 MG capsule  Every 12 hours     07/07/17 1916    prochlorperazine (COMPAZINE) 10 MG tablet  2 times daily PRN     07/07/17 1916     Clinical Impression: 1. Cough   2. Influenza   3. Malaise and fatigue     Disposition: Discharge  Condition: Good  I have discussed the results, Dx and Tx plan with the pt(& family if present). He/she/they expressed understanding and agree(s) with the plan. Discharge instructions discussed at great length. Strict return precautions discussed and pt &/or family have verbalized understanding of the instructions. No further questions at time of discharge.    New Prescriptions   OSELTAMIVIR (TAMIFLU) 75 MG CAPSULE    Take 1 capsule (75 mg total) by mouth every 12 (twelve) hours.   PROCHLORPERAZINE (COMPAZINE) 10 MG TABLET    Take 1 tablet (10 mg total) by mouth 2 (two) times daily as needed for nausea or vomiting.    Follow Up: [place] [id number] [phone] Schedule an appointment as soon as possible for a visit    [facility] [address] [id number] [address] [place]       Tegeler, Gwenyth Allegra, MD 07/08/17 [phone]

## 2017-07-07 NOTE — ED Notes (Signed)
Spoke with mother in hallway-- mother crying, yelling, stating that they are "being held because they don't have insurance and it is the change of shift" -- and "no one offered [place] anything to drink or eat"  Pt is out of room asking for snacks-- lab notified -- results positive for flu A

## 2017-07-07 NOTE — ED Triage Notes (Signed)
Pt reports cough, congestion bodyaches, and headaches since yesterday. Pt reports using OTC medications and albuterol yesterday.

## 2017-07-07 NOTE — ED Notes (Signed)
Pt mother at desk yelling at this RN stating "I know the flu test is back and y'all are holding [place] here either because its shift change or because she don't have insurance and you wanna keep her here". I explained to the pt the flu swab still wasn't back yet and apologized for delay. Pt continues to curse at this RN and requesting to speak to charge nurse. Security made aware and charge nurse Santiago Glad speaking with pt.

## 2017-07-07 NOTE — Discharge Instructions (Signed)
Your workup today showed evidence of influenza as the cause of your symptoms.  Please stay hydrated and use the medicine to help with.  Please use the Tamiflu to help shorten the duration of your infection.  Please follow-up with a primary care physician for further reassessment and management.  If any symptoms change or worsen, please return to the nearest emergency department.

## 2017-10-05 ENCOUNTER — Emergency Department (HOSPITAL_COMMUNITY)
Admission: EM | Admit: 2017-10-05 | Discharge: 2017-10-05 | Disposition: A | Discharge status: Home / Self Care | Payer: Self-pay | Attending: Emergency Medicine | Admitting: Emergency Medicine

## 2017-10-05 ENCOUNTER — Encounter (HOSPITAL_COMMUNITY): Payer: Self-pay | Admitting: Emergency Medicine

## 2017-10-05 DIAGNOSIS — J45909 Unspecified asthma, uncomplicated: Secondary | ICD-10-CM | POA: Insufficient documentation

## 2017-10-05 DIAGNOSIS — E119 Type 2 diabetes mellitus without complications: Secondary | ICD-10-CM | POA: Insufficient documentation

## 2017-10-05 DIAGNOSIS — Z79899 Other long term (current) drug therapy: Secondary | ICD-10-CM | POA: Insufficient documentation

## 2017-10-05 DIAGNOSIS — L02419 Cutaneous abscess of limb, unspecified: Secondary | ICD-10-CM

## 2017-10-05 DIAGNOSIS — F909 Attention-deficit hyperactivity disorder, unspecified type: Secondary | ICD-10-CM | POA: Insufficient documentation

## 2017-10-05 DIAGNOSIS — L02411 Cutaneous abscess of right axilla: Secondary | ICD-10-CM | POA: Insufficient documentation

## 2017-10-05 DIAGNOSIS — F17210 Nicotine dependence, cigarettes, uncomplicated: Secondary | ICD-10-CM | POA: Insufficient documentation

## 2017-10-05 DIAGNOSIS — Z794 Long term (current) use of insulin: Secondary | ICD-10-CM | POA: Insufficient documentation

## 2017-10-05 DIAGNOSIS — I10 Essential (primary) hypertension: Secondary | ICD-10-CM | POA: Insufficient documentation

## 2017-10-05 DIAGNOSIS — Z23 Encounter for immunization: Secondary | ICD-10-CM | POA: Insufficient documentation

## 2017-10-05 MED ORDER — HYDROCODONE-ACETAMINOPHEN 5-325 MG PO TABS: 1.0000 | ORAL_TABLET | Freq: Four times a day (QID) | ORAL | 0 refills | Status: DC | PRN

## 2017-10-05 MED ORDER — LIDOCAINE-EPINEPHRINE (PF) 2 %-1:200000 IJ SOLN
20.0000 mL | Freq: Once | INTRAMUSCULAR | Status: AC
  Administered 2017-10-05: 20 mL
  Filled 2017-10-05: qty 20

## 2017-10-05 MED ORDER — TETANUS-DIPHTH-ACELL PERTUSSIS 5-2.5-18.5 LF-MCG/0.5 IM SUSP
0.5000 mL | Freq: Once | INTRAMUSCULAR | Status: AC
  Administered 2017-10-05: 0.5 mL via INTRAMUSCULAR
  Filled 2017-10-05: qty 0.5

## 2017-10-05 MED ORDER — DOXYCYCLINE HYCLATE 100 MG PO CAPS: 100.0000 mg | ORAL_CAPSULE | Freq: Two times a day (BID) | ORAL | 0 refills | Status: DC

## 2017-10-05 NOTE — Discharge Instructions (Signed)
Please take entire course of antibiotics as directed.  You may use ibuprofen, Tylenol and warm compresses, you may use Norco as needed for breakthrough pain.  You will need to return to the emergency department, primary care or urgent care in 2 days for wound check and to have your packing removed.  You should expect to see some drainage but if you start having severe pain, worsening or spreading redness around it or large increased amounts of drainage please return to the ED for sooner reevaluation.

## 2017-10-05 NOTE — ED Provider Notes (Signed)
[facility] EMERGENCY DEPARTMENT Provider Note   CSN: [id number] Arrival date & time: 10/05/17  1144     History   Chief Complaint Chief Complaint  Patient presents with  . Abscess    HPI Mercedes Valdez is a 22 y.o. female.  Mercedes Valdez is a 22 y.o. Female with a history of asthma, diabetes, obesity, PCOS and ADHD, who presents to the emergency department for evaluation of abscess.  Patient reports abscess under the right axilla, started to 3 days ago and has been getting increasingly larger.  Patient reports constant throbbing pain that is worse when she moves her arm, there is some surrounding redness, but no streaking reported.  No drainage from the area at home.  Patient denies any fevers or chills, nausea, vomiting or generalized malaise.  Patient reports history of similar abscesses in the past, patient is diabetic, patient's mother has hidradenitis suppurativa.  Last abscess in the right axilla was approximately 1 year ago.  Patient has not taken anything for pain prior to arrival.  No other aggravating or alleviating factors.     Past Medical History:  Diagnosis Date  . ADHD (attention deficit hyperactivity disorder)   . Asthma   . Diabetes mellitus    Type 2  . Elevated blood pressure   . Obesity   . PCOS (polycystic ovarian syndrome)     Patient Active Problem List   Diagnosis Date Noted  . Psychosocial stressors 09/26/2014  . Insulin dependent type 2 diabetes mellitus ([place]) 09/06/2014  . Bacterial vaginosis 09/05/2014  . STI (sexually transmitted infection) 09/05/2014  . Multiple drug allergies 02/27/2014  . Musculoskeletal pain 04/08/2013  . Unspecified gastritis and gastroduodenitis without mention of hemorrhage 04/08/2013  . Type 2 diabetes mellitus ([place]) 04/08/2013  . Irregular menses 04/08/2013  . Dysfunctional uterine bleeding 11/09/2012  . PCOS (polycystic ovarian syndrome) 09/29/2012  . Asthma     Past Surgical History:    Procedure Laterality Date  . EXTERNAL EAR SURGERY     ear tubes     OB History   None      Home Medications    Prior to Admission medications   Medication Sig Start Date End Date Taking? Authorizing Provider  acyclovir (ZOVIRAX) 400 MG tablet Take 1 tablet (400 mg total) by mouth 3 (three) times daily. 05/28/16   [name], MD  albuterol (PROVENTIL HFA;VENTOLIN HFA) 108 (90 BASE) MCG/ACT inhaler Inhale 2 puffs into the lungs every 4 (four) hours as needed for wheezing. 11/16/13   Ettefagh, Paul Dykes, MD  albuterol (PROVENTIL) (2.5 MG/3ML) 0.083% nebulizer solution Take 3 mLs (2.5 mg total) by nebulization every 4 (four) hours as needed for wheezing or shortness of breath. 11/16/13   Ettefagh, Paul Dykes, MD  cetirizine (ZYRTEC) 10 MG tablet Take 1 tablet (10 mg total) by mouth daily. 09/03/15   Gloriann Loan, PA-C  doxycycline (VIBRAMYCIN) 100 MG capsule Take 1 capsule (100 mg total) by mouth 2 (two) times daily. 07/15/15   Lawyer, Harrell Gave, PA-C  fluticasone (FLONASE) 50 MCG/ACT nasal spray Place 2 sprays into both nostrils daily. 09/03/15   Gloriann Loan, PA-C  glucose blood test strip Check blood glucose twice a day 09/06/14   Ok Edwards, MD  ibuprofen (ADVIL,MOTRIN) 800 MG tablet Take 1 tablet (800 mg total) by mouth every 8 (eight) hours as needed. 07/15/15   Lawyer, Harrell Gave, PA-C  insulin glargine (LANTUS) 100 UNIT/ML injection Inject 0.25 mLs (25 Units total) into the skin at  bedtime. 10/11/15   Ward, Ozella Almond, PA-C  Insulin Pen Needle 31G X 5 MM MISC Use 1 time daily as instructed. 09/06/14   Britt Bottom, NP  Lancets (ACCU-CHEK SOFT TOUCH) lancets Check blood glucose twice a day 09/06/14   Ok Edwards, MD  lidocaine (XYLOCAINE) 2 % jelly Apply 1 application topically once. 09/06/14   Britt Bottom, NP  metFORMIN (GLUCOPHAGE) 500 MG tablet Take 2 tablets (1,000 mg total) by mouth 2 (two) times daily with a meal. Patient not taking: Reported on 07/15/2015  09/25/14   Ok Edwards, MD  omeprazole (PRILOSEC) 20 MG capsule Take 1 capsule (20 mg total) by mouth daily. Patient not taking: Reported on 09/04/2014 11/21/13   Cioffredi, Eulis Canner, MD  oseltamivir (TAMIFLU) 75 MG capsule Take 1 capsule (75 mg total) by mouth every 12 (twelve) hours. 07/07/17   Tegeler, Gwenyth Allegra, MD  permethrin (ELIMITE) 5 % cream Apply to affected area once daily x 7 days. May repeat in 14 days. 12/14/15   Molt, Bethany, DO  prochlorperazine (COMPAZINE) 10 MG tablet Take 1 tablet (10 mg total) by mouth 2 (two) times daily as needed for nausea or vomiting. 07/07/17   Tegeler, Gwenyth Allegra, MD    Family History Family History  Problem Relation Age of Onset  . Diabetes Mother   . Vision loss Mother   . ADD / ADHD Brother   . Allergies Brother   . Asthma Brother   . Vision loss Brother   . Cancer Paternal Grandfather        Colon Cancer  . Heart disease Father 67       died from MI at age 20    Social History Social History   Tobacco Use  . Smoking status: Current Every Day Smoker    Packs/day: 0.20    Types: Cigarettes  Substance Use Topics  . Alcohol use: Yes  . Drug use: Yes    Types: Marijuana     Allergies   Amoxicillin; Augmentin [amoxicillin-pot clavulanate]; Penicillins; and Suprax [cefixime]   Review of Systems Review of Systems  Constitutional: Negative for chills and fever.  Gastrointestinal: Negative for nausea and vomiting.  Skin: Positive for color change.       Abscess  Neurological: Negative for weakness and numbness.     Physical Exam Updated Vital Signs BP (!) 146/97 (BP Location: Right Arm)   Pulse (!) 106   Temp 98.4 F (36.9 C) (Oral)   Resp 16   SpO2 100%   Physical Exam  Constitutional: She appears well-developed and well-nourished. No distress.  HENT:  Head: Normocephalic and atraumatic.  Eyes: Right eye exhibits no discharge. Left eye exhibits no discharge.  Pulmonary/Chest: Effort normal. No respiratory  distress.  Musculoskeletal:  2 x 3 cm area of fluctuance and induration with overlying erythema in the right axilla, no appreciable lymphadenopathy, no lymphangitic streaking, no appreciable swelling or erythema in the distal arm, distal radial pulses 2+, good capillary refill, motor function and sensation intact  Neurological: She is alert. Coordination normal.  Skin: Skin is warm and dry. Capillary refill takes less than 2 seconds. She is not diaphoretic.  Psychiatric: She has a normal mood and affect. Her behavior is normal.  Nursing note and vitals reviewed.    ED Treatments / Results  Labs (all labs ordered are listed, but only abnormal results are displayed) Labs Reviewed - No data to display  EKG None  Radiology No results found.  Procedures  EMERGENCY DEPARTMENT  US SOFT TISSUE INTERPRETATION "Study: Limited Soft Tissue Ultrasound"  INDICATIONS: Soft tissue infection Multiple views of the body part were obtained in real-time with a multi-frequency linear probe  PERFORMED BY: Myself IMAGES ARCHIVED?: Yes SIDE:Right  BODY PART:Axilla INTERPRETATION:  Abcess present and Cellulitis present   .Marland KitchenIncision and Drainage Date/Time: 10/05/2017 5:29 PM Performed by: Jacqlyn Larsen, PA-C Authorized by: Jacqlyn Larsen, PA-C   Consent:    Consent obtained:  Verbal   Consent given by:  Patient   Risks discussed:  Incomplete drainage, infection, bleeding and pain   Alternatives discussed:  No treatment Location:    Type:  Abscess   Size:  2 x 3 cm   Location:  Upper extremity   Upper extremity location: Right Axilla. Pre-procedure details:    Skin preparation:  Betadine Anesthesia (see MAR for exact dosages):    Anesthesia method:  Local infiltration   Local anesthetic:  Lidocaine 2% WITH epi Procedure type:    Complexity:  Simple Procedure details:    Incision types:  Single straight   Incision depth:  Dermal   Scalpel blade:  11   Wound management:  Probed and  deloculated and irrigated with saline   Drainage:  Purulent   Drainage amount:  Moderate   Wound treatment:  Wound left open   Packing materials:  1/4 in iodoform gauze Post-procedure details:    Patient tolerance of procedure:  Tolerated well, no immediate complications   (including critical care time)  Medications Ordered in ED Medications  lidocaine-EPINEPHrine (XYLOCAINE W/EPI) 2 %-1:200000 (PF) injection 20 mL (20 mLs Infiltration Given 10/05/17 1619)  Tdap (BOOSTRIX) injection 0.5 mL (0.5 mLs Intramuscular Given 10/05/17 1619)     Initial Impression / Assessment and Plan / ED Course  I have reviewed the triage vital signs and the nursing notes.  Pertinent labs & imaging results that were available during my care of the patient were reviewed by me and considered in my medical decision making (see chart for details).  Patient with skin abscess of the right axilla amenable to incision and drainage, history of the same, patient is diabetic, family history of hidradenitis.  Abscess packed with 1/4 iodoform gauze,  wound recheck in 2 days. Encouraged home warm soaks and flushing.  Moderate surrounding cellulitis, will treat with doxycycline.  Strict return precautions discussed.  Patient expresses understanding and is in agreement with plan.  Final Clinical Impressions(s) / ED Diagnoses   Final diagnoses:  Axillary abscess    ED Discharge Orders        Ordered    doxycycline (VIBRAMYCIN) 100 MG capsule  2 times daily     10/05/17 1638    HYDROcodone-acetaminophen (NORCO) 5-325 MG tablet  Every 6 hours PRN     10/05/17 1638       Jacqlyn Larsen, [place] 10/05/17 1732    Hayden Rasmussen, MD 10/06/17 1141

## 2018-04-07 ENCOUNTER — Other Ambulatory Visit: Payer: Self-pay

## 2018-04-07 ENCOUNTER — Emergency Department (HOSPITAL_COMMUNITY)
Admission: EM | Admit: 2018-04-07 | Discharge: 2018-04-07 | Disposition: A | Discharge status: Home / Self Care | Payer: Self-pay | Attending: Emergency Medicine | Admitting: Emergency Medicine

## 2018-04-07 ENCOUNTER — Encounter (HOSPITAL_COMMUNITY): Payer: Self-pay | Admitting: Emergency Medicine

## 2018-04-07 DIAGNOSIS — L02211 Cutaneous abscess of abdominal wall: Secondary | ICD-10-CM | POA: Insufficient documentation

## 2018-04-07 DIAGNOSIS — E119 Type 2 diabetes mellitus without complications: Secondary | ICD-10-CM | POA: Insufficient documentation

## 2018-04-07 DIAGNOSIS — F17210 Nicotine dependence, cigarettes, uncomplicated: Secondary | ICD-10-CM | POA: Insufficient documentation

## 2018-04-07 DIAGNOSIS — Z79899 Other long term (current) drug therapy: Secondary | ICD-10-CM | POA: Insufficient documentation

## 2018-04-07 DIAGNOSIS — L0291 Cutaneous abscess, unspecified: Secondary | ICD-10-CM

## 2018-04-07 DIAGNOSIS — Z9114 Patient's other noncompliance with medication regimen: Secondary | ICD-10-CM | POA: Insufficient documentation

## 2018-04-07 DIAGNOSIS — J45909 Unspecified asthma, uncomplicated: Secondary | ICD-10-CM | POA: Insufficient documentation

## 2018-04-07 DIAGNOSIS — Z794 Long term (current) use of insulin: Secondary | ICD-10-CM | POA: Insufficient documentation

## 2018-04-07 DIAGNOSIS — Z23 Encounter for immunization: Secondary | ICD-10-CM | POA: Insufficient documentation

## 2018-04-07 MED ORDER — LIDOCAINE-EPINEPHRINE (PF) 2 %-1:200000 IJ SOLN
10.0000 mL | Freq: Once | INTRAMUSCULAR | Status: AC
  Administered 2018-04-07: 10 mL via INTRADERMAL
  Filled 2018-04-07: qty 10

## 2018-04-07 MED ORDER — TETANUS-DIPHTH-ACELL PERTUSSIS 5-2.5-18.5 LF-MCG/0.5 IM SUSP
0.5000 mL | Freq: Once | INTRAMUSCULAR | Status: AC
  Administered 2018-04-07: 0.5 mL via INTRAMUSCULAR
  Filled 2018-04-07: qty 0.5

## 2018-04-07 MED ORDER — CLINDAMYCIN HCL 150 MG PO CAPS: 150.0000 mg | ORAL_CAPSULE | Freq: Four times a day (QID) | ORAL | 0 refills | Status: DC

## 2018-04-07 MED ORDER — IBUPROFEN 600 MG PO TABS: 600.0000 mg | ORAL_TABLET | Freq: Four times a day (QID) | ORAL | 0 refills | Status: DC | PRN

## 2018-04-07 NOTE — ED Provider Notes (Signed)
Medical screening examination/treatment/procedure(s) were conducted as a shared visit with non-physician practitioner(s) and myself.  I personally evaluated the patient during the encounter.  Clinical Impression:   Final diagnoses:  Abscess    Pt is a diabetic, presents with abscess to the midline of the SP area just below the pannus - there is some indurait5on and ttp and mild fluctuance of one part - she has no fever, no other sx.  Needs I and D, abx and home.  I counseled the patient on the medications that she takes, her lack of compliance with checking her blood sugar and her complete lack of compliance of following a good diet.  On insulin.  She had sugar drinks and other non diabetic friendly foods in the room with her that she has been eating and endorses eating cake and cookies and ice cream frequently.  Counseled.   Noemi Chapel, MD 04/07/18 [phone]

## 2018-04-07 NOTE — Discharge Instructions (Addendum)
You have been evaluated for your abscess.  Please continue to apply warm compress to affected area several times daily.  Take antibiotic as prescribed for the full duration.  Return in 48 hrs for packing removal and wound reassessment.  Return sooner if you have any concerns.

## 2018-04-07 NOTE — ED Triage Notes (Signed)
Pt to ED with c/o abscess to mid lower abd x's 1 week.  No drainage noted.,  Pt st's she has been using hot packs to area

## 2018-04-07 NOTE — ED Provider Notes (Signed)
[facility] EMERGENCY DEPARTMENT Provider Note   CSN: [id number] Arrival date & time: 04/07/18  1916     History   Chief Complaint Chief Complaint  Patient presents with  . Abscess    HPI Mercedes Valdez is a 22 y.o. female.  The history is provided by the patient. No language interpreter was used.  Abscess  Associated symptoms: no fever       22 year old female insulin dependence who has been taking her medication for the past 3 years due to lack of access resenting complaining of skin infection.  Patient noticed an abscess formation to her lower pannus that started approximately a week ago.  Has not been increasing in size, becoming more painful especially tender to the touch.  Pain is moderate in severity not adequately improved with warm compress and ibuprofen.  She endorsed occasional hot flash but denies any fever, nausea vomiting diarrhea, dysuria or discharge.  She is not up-to-date with tetanus.  She denies any recent injury to the affected area.  She does not have a primary care provider.     Past Medical History:  Diagnosis Date  . ADHD (attention deficit hyperactivity disorder)   . Asthma   . Diabetes mellitus    Type 2  . Elevated blood pressure   . Obesity   . PCOS (polycystic ovarian syndrome)     Patient Active Problem List   Diagnosis Date Noted  . Psychosocial stressors 09/26/2014  . Insulin dependent type 2 diabetes mellitus ([place]) 09/06/2014  . Bacterial vaginosis 09/05/2014  . STI (sexually transmitted infection) 09/05/2014  . Multiple drug allergies 02/27/2014  . Musculoskeletal pain 04/08/2013  . Unspecified gastritis and gastroduodenitis without mention of hemorrhage 04/08/2013  . Type 2 diabetes mellitus ([place]) 04/08/2013  . Irregular menses 04/08/2013  . Dysfunctional uterine bleeding 11/09/2012  . PCOS (polycystic ovarian syndrome) 09/29/2012  . Asthma     Past Surgical History:  Procedure Laterality Date  .  EXTERNAL EAR SURGERY     ear tubes     OB History   None      Home Medications    Prior to Admission medications   Medication Sig Start Date End Date Taking? Authorizing Provider  acyclovir (ZOVIRAX) 400 MG tablet Take 1 tablet (400 mg total) by mouth 3 (three) times daily. 05/28/16   [name], MD  albuterol (PROVENTIL HFA;VENTOLIN HFA) 108 (90 BASE) MCG/ACT inhaler Inhale 2 puffs into the lungs every 4 (four) hours as needed for wheezing. 11/16/13   Ettefagh, Paul Dykes, MD  albuterol (PROVENTIL) (2.5 MG/3ML) 0.083% nebulizer solution Take 3 mLs (2.5 mg total) by nebulization every 4 (four) hours as needed for wheezing or shortness of breath. 11/16/13   Ettefagh, Paul Dykes, MD  cetirizine (ZYRTEC) 10 MG tablet Take 1 tablet (10 mg total) by mouth daily. 09/03/15   Gloriann Loan, PA-C  doxycycline (VIBRAMYCIN) 100 MG capsule Take 1 capsule (100 mg total) by mouth 2 (two) times daily. One po bid x 7 days 10/05/17   Jacqlyn Larsen, PA-C  fluticasone [facility]) 50 MCG/ACT nasal spray Place 2 sprays into both nostrils daily. 09/03/15   Gloriann Loan, PA-C  glucose blood test strip Check blood glucose twice a day 09/06/14   Ok Edwards, MD  HYDROcodone-acetaminophen (NORCO) 5-325 MG tablet Take 1 tablet by mouth every 6 (six) hours as needed. 10/05/17   Jacqlyn Larsen, PA-C  ibuprofen (ADVIL,MOTRIN) 800 MG tablet Take 1 tablet (800 mg total)  by mouth every 8 (eight) hours as needed. 07/15/15   Lawyer, Harrell Gave, PA-C  insulin glargine (LANTUS) 100 UNIT/ML injection Inject 0.25 mLs (25 Units total) into the skin at bedtime. 10/11/15   Ward, Ozella Almond, PA-C  Insulin Pen Needle 31G X 5 MM MISC Use 1 time daily as instructed. 09/06/14   Britt Bottom, NP  Lancets (ACCU-CHEK SOFT TOUCH) lancets Check blood glucose twice a day 09/06/14   Ok Edwards, MD  lidocaine (XYLOCAINE) 2 % jelly Apply 1 application topically once. 09/06/14   Britt Bottom, NP  metFORMIN (GLUCOPHAGE) 500 MG tablet  Take 2 tablets (1,000 mg total) by mouth 2 (two) times daily with a meal. Patient not taking: Reported on 07/15/2015 09/25/14   Ok Edwards, MD  omeprazole (PRILOSEC) 20 MG capsule Take 1 capsule (20 mg total) by mouth daily. Patient not taking: Reported on 09/04/2014 11/21/13   Cioffredi, Eulis Canner, MD  oseltamivir (TAMIFLU) 75 MG capsule Take 1 capsule (75 mg total) by mouth every 12 (twelve) hours. 07/07/17   Tegeler, Gwenyth Allegra, MD  permethrin (ELIMITE) 5 % cream Apply to affected area once daily x 7 days. May repeat in 14 days. 12/14/15   Molt, Bethany, DO  prochlorperazine (COMPAZINE) 10 MG tablet Take 1 tablet (10 mg total) by mouth 2 (two) times daily as needed for nausea or vomiting. 07/07/17   Tegeler, Gwenyth Allegra, MD    Family History Family History  Problem Relation Age of Onset  . Diabetes Mother   . Vision loss Mother   . ADD / ADHD Brother   . Allergies Brother   . Asthma Brother   . Vision loss Brother   . Cancer Paternal Grandfather        Colon Cancer  . Heart disease Father 11       died from MI at age 43    Social History Social History   Tobacco Use  . Smoking status: Current Every Day Smoker    Packs/day: 0.20    Types: Cigarettes  . Smokeless tobacco: Never Used  Substance Use Topics  . Alcohol use: Yes  . Drug use: Yes    Types: Marijuana     Allergies   Amoxicillin; Augmentin [amoxicillin-pot clavulanate]; Penicillins; and Suprax [cefixime]   Review of Systems Review of Systems  Constitutional: Negative for fever.  Skin:       Cutaneous abscess on lower pannus  Neurological: Negative for numbness.     Physical Exam Updated Vital Signs BP 132/83 (BP Location: Right Arm)   Pulse (!) 114   Temp 98.6 F (37 C) (Oral)   Resp 18   Ht 5\' 3"  (1.6 m)   Wt 100.7 kg   LMP 03/19/2018 (Exact Date)   SpO2 100%   BMI 39.33 kg/m   Physical Exam  Constitutional: She appears well-developed and well-nourished. No distress.  Obese female  resting comfortably in bed, eating food in no acute discomfort.  HENT:  Head: Atraumatic.  Eyes: Conjunctivae are normal.  Neck: Neck supple.  Abdominal: Soft.  Neurological: She is alert.  Skin: No rash noted.  To the suprapubic region inferior to the pannus is an area of induration with fluctuance and tenderness to palpation measuring approximately 4 cm in diameter with minimal skin erythema.  Psychiatric: She has a normal mood and affect.  Nursing note and vitals reviewed.    ED Treatments / Results  Labs (all labs ordered are listed, but only abnormal results are displayed) Labs Reviewed -  No data to display  EKG None  Radiology No results found.  Procedures .Marland KitchenIncision and Drainage Date/Time: 04/07/2018 9:41 PM Performed by: Domenic Moras, PA-C Authorized by: Domenic Moras, PA-C   Consent:    Consent obtained:  Verbal   Consent given by:  Patient   Risks discussed:  Bleeding, incomplete drainage, pain and damage to other organs   Alternatives discussed:  No treatment Universal protocol:    Procedure explained and questions answered to patient or proxy's satisfaction: yes     Relevant documents present and verified: yes     Test results available and properly labeled: yes     Imaging studies available: yes     Required blood products, implants, devices, and special equipment available: yes     Site/side marked: yes     Immediately prior to procedure a time out was called: yes     Patient identity confirmed:  Verbally with patient Location:    Type:  Abscess   Size:  4 cm   Location:  Trunk   Trunk location:  Abdomen (mid suprapubic region) Pre-procedure details:    Skin preparation:  Betadine Anesthesia (see MAR for exact dosages):    Anesthesia method:  Local infiltration   Local anesthetic:  Lidocaine 1% WITH epi Procedure type:    Complexity:  Complex Procedure details:    Incision types:  Single straight   Incision depth:  Subcutaneous   Scalpel blade:   11   Wound management:  Probed and deloculated, irrigated with saline and extensive cleaning   Drainage:  Purulent   Drainage amount:  Copious   Packing materials:  1/4 in gauze Post-procedure details:    Patient tolerance of procedure:  Tolerated well, no immediate complications   (including critical care time)  Medications Ordered in ED Medications  lidocaine-EPINEPHrine (XYLOCAINE W/EPI) 2 %-1:200000 (PF) injection 10 mL (10 mLs Intradermal Given 04/07/18 2059)  Tdap (BOOSTRIX) injection 0.5 mL (0.5 mLs Intramuscular Given 04/07/18 2100)     Initial Impression / Assessment and Plan / ED Course  I have reviewed the triage vital signs and the nursing notes.  Pertinent labs & imaging results that were available during my care of the patient were reviewed by me and considered in my medical decision making (see chart for details).     BP 132/83 (BP Location: Right Arm)   Pulse (!) 114   Temp 98.6 F (37 C) (Oral)   Resp 18   Ht 5\' 3"  (1.6 m)   Wt 100.7 kg   LMP 03/19/2018 (Exact Date)   SpO2 100%   BMI 39.33 kg/m    Final Clinical Impressions(s) / ED Diagnoses   Final diagnoses:  Abscess    ED Discharge Orders    None     8:36 PM Patient presents with a cutaneous abscess involving the suprapubic region that would benefit from incision and drainage.  She is a diabetic but does not take her medication on a regular basis.  She was found to be mildly tachycardic.  However, she is well-appearing in no acute discomfort and nontoxic in appearance.  She will need tetanus shot as well.  She is amenable for incision and drainage.  9:42 PM Successful I&D with copious purulent discharge.  Pt tolerates well.  Packing placed.  Encourage warm compress several times daily, antibiotic and pain medication prescribed.  Pt to return in 48 hrs for wound recheck and packing removal.  Return sooner if sxs worsen.  Pt voice understanding and agrees  with plan.     Domenic Moras,  PA-C 04/07/18 2226    Noemi Chapel, MD 04/07/18 [phone]

## 2018-09-13 ENCOUNTER — Other Ambulatory Visit: Payer: Self-pay

## 2018-09-13 ENCOUNTER — Ambulatory Visit (INDEPENDENT_AMBULATORY_CARE_PROVIDER_SITE_OTHER): Payer: Self-pay | Admitting: Family Medicine

## 2018-09-13 ENCOUNTER — Encounter: Payer: Self-pay | Admitting: Family Medicine

## 2018-09-13 DIAGNOSIS — D473 Essential (hemorrhagic) thrombocythemia: Secondary | ICD-10-CM

## 2018-09-13 DIAGNOSIS — E1165 Type 2 diabetes mellitus with hyperglycemia: Secondary | ICD-10-CM

## 2018-09-13 DIAGNOSIS — E669 Obesity, unspecified: Secondary | ICD-10-CM

## 2018-09-13 DIAGNOSIS — D75839 Thrombocytosis, unspecified: Secondary | ICD-10-CM

## 2018-09-13 NOTE — Progress Notes (Signed)
Virtual Visit via Telephone Note  I connected with Mercedes Valdez on 09/13/18 at 10:30 AM EDT by telephone and verified that I am speaking with the correct person using two identifiers.   I discussed the limitations, risks, security and privacy concerns of performing an evaluation and management service by telephone and the availability of in person appointments. I also discussed with the patient that there may be a patient responsible charge related to this service. The patient expressed understanding and agreed to proceed.   History of Present Illness:  Diabetes Mercedes Valdez has a history of insulin dependent diabetes. She lost insurance back in 2016 and was forced to stop taking medications. She recently had an episode of epistaxis in which EMS was called. EMS checked her blood sugar and reading was significant for hyperglycemia (288). Uncertain of what her last A1C was. Her blood pressure was normal when measured by EMS. Diagnosed at age 23 with type 2 diabetes. Family history significant for mother with Type 2 insulin dependent diabetes. Last prescribed diabetes regimen included Lantus 15-25 units once daily and metformin. Last measurable weight documented 222 lbs. Reports weight has also always averaged within the 200 range. She endorses light smoking with an average of 3 cigarettes per day. She is inactive for routine physical exercise She has had a recent eye exam which was negative for diabetic retinopathy. She wears corrective lenses. Denies 3 P's,chest pain, shortness of breath, weakness, or edema.    Assessment and Plan: 1. Type 2 diabetes mellitus with hyperglycemia, without long-term current use of insulin ([place]) Pending result from labs will follow-up with patient and initiate treatment and management of diabetes. Will also initiate statin and ACE/ARB therapy for vascular and renal protection. - Microalbumin, urine; Future - Hemoglobin A1c; Future - Comprehensive metabolic panel;  Future  2. Obesity without serious comorbidity, unspecified classification, unspecified obesity type  Encouraged efforts to reduce weight include engaging in physical activity as tolerated with goal of 150 minutes per week. Improve dietary choices and eat a meal regimen consistent with a Mediterranean or DASH diet. Reduce simple carbohydrates. Do not skip meals and eat healthy snacks throughout the day to avoid over-eating at dinner. Set a goal weight loss that is achievable for you. Checking the following: - Lipid panel; Future - TSH; Future  3. Thrombocytosis ([place]) - CBC with Differential; Future    Follow Up Instructions: Return to office for fasting labs 09/15/18   I discussed the assessment and treatment plan with the patient. The patient was provided an opportunity to ask questions and all were answered. The patient agreed with the plan and demonstrated an understanding of the instructions.   The patient was advised to call back or seek an in-person evaluation if the symptoms worsen or if the condition fails to improve as anticipated.  I provided 25 minutes of non-face-to-face time during this encounter.   Molli Barrows, FNP

## 2018-09-13 NOTE — Progress Notes (Deleted)
Called patient to initiate their telephone visit with provider Molli Barrows, FNP-C. Verified date of birth. Patient states that she isn't currently taking any medications for her DM or BP. Doesn't check FSBS at home. Has c/o polyuria. KWalker, CMA.

## 2018-09-15 ENCOUNTER — Other Ambulatory Visit: Payer: Self-pay

## 2018-09-15 DIAGNOSIS — E1165 Type 2 diabetes mellitus with hyperglycemia: Secondary | ICD-10-CM

## 2018-09-15 DIAGNOSIS — E669 Obesity, unspecified: Secondary | ICD-10-CM

## 2018-09-15 DIAGNOSIS — D75839 Thrombocytosis, unspecified: Secondary | ICD-10-CM

## 2018-09-15 DIAGNOSIS — D473 Essential (hemorrhagic) thrombocythemia: Secondary | ICD-10-CM

## 2018-09-16 LAB — COMPREHENSIVE METABOLIC PANEL
ALT: 10 IU/L (ref 0–32)
AST: 9 IU/L (ref 0–40)
Albumin/Globulin Ratio: 1.6 (ref 1.2–2.2)
Albumin: 4.5 g/dL (ref 3.9–5.0)
Alkaline Phosphatase: 104 IU/L (ref 39–117)
BUN/Creatinine Ratio: 14 (ref 9–23)
BUN: 8 mg/dL (ref 6–20)
Bilirubin Total: 0.3 mg/dL (ref 0.0–1.2)
CO2: 23 mmol/L (ref 20–29)
Calcium: 10 mg/dL (ref 8.7–10.2)
Chloride: 96 mmol/L (ref 96–106)
Creatinine, Ser: 0.58 mg/dL (ref 0.57–1.00)
GFR calc Af Amer: 148 mL/min/{1.73_m2} (ref >59)
GFR calc non Af Amer: 129 mL/min/{1.73_m2} (ref >59)
Globulin, Total: 2.9 g/dL (ref 1.5–4.5)
Glucose: 359 mg/dL — ABNORMAL HIGH (ref 65–99)
Potassium: 4.7 mmol/L (ref 3.5–5.2)
Sodium: 136 mmol/L (ref 134–144)
Total Protein: 7.4 g/dL (ref 6.0–8.5)

## 2018-09-16 LAB — CBC WITH DIFFERENTIAL/PLATELET
Basophils Absolute: 0 10*3/uL (ref 0.0–0.2)
Basos: 0 %
EOS (ABSOLUTE): 0.2 10*3/uL (ref 0.0–0.4)
Eos: 2 %
Hematocrit: 41.9 % (ref 34.0–46.6)
Hemoglobin: 13.4 g/dL (ref 11.1–15.9)
Immature Grans (Abs): 0 10*3/uL (ref 0.0–0.1)
Immature Granulocytes: 0 %
Lymphocytes Absolute: 5 10*3/uL — ABNORMAL HIGH (ref 0.7–3.1)
Lymphs: 54 %
MCH: 26.2 pg — ABNORMAL LOW (ref 26.6–33.0)
MCHC: 32 g/dL (ref 31.5–35.7)
MCV: 82 fL (ref 79–97)
Monocytes Absolute: 0.4 10*3/uL (ref 0.1–0.9)
Monocytes: 4 %
Neutrophils Absolute: 3.8 10*3/uL (ref 1.4–7.0)
Neutrophils: 40 %
Platelets: 615 10*3/uL — ABNORMAL HIGH (ref 150–450)
RBC: 5.11 x10E6/uL (ref 3.77–5.28)
RDW: 14.4 % (ref 11.7–15.4)
WBC: 9.4 10*3/uL (ref 3.4–10.8)

## 2018-09-16 LAB — LIPID PANEL
Chol/HDL Ratio: 5.9 ratio — ABNORMAL HIGH (ref 0.0–4.4)
Cholesterol, Total: 288 mg/dL — ABNORMAL HIGH (ref 100–199)
HDL: 49 mg/dL (ref >39)
LDL Calculated: 206 mg/dL — ABNORMAL HIGH (ref 0–99)
Triglycerides: 166 mg/dL — ABNORMAL HIGH (ref 0–149)
VLDL Cholesterol Cal: 33 mg/dL (ref 5–40)

## 2018-09-16 LAB — MICROALBUMIN, URINE: Microalbumin, Urine: 80.5 ug/mL

## 2018-09-16 LAB — HEMOGLOBIN A1C
Est. average glucose Bld gHb Est-mCnc: 361 mg/dL
Hgb A1c MFr Bld: 14.2 % — ABNORMAL HIGH (ref 4.8–5.6)

## 2018-09-16 LAB — TSH: TSH: 2.16 u[IU]/mL (ref 0.450–4.500)

## 2018-09-17 ENCOUNTER — Ambulatory Visit: Payer: Self-pay | Admitting: Family Medicine

## 2018-09-20 ENCOUNTER — Other Ambulatory Visit: Payer: Self-pay

## 2018-09-20 ENCOUNTER — Encounter: Payer: Self-pay | Admitting: Family Medicine

## 2018-09-20 ENCOUNTER — Ambulatory Visit (INDEPENDENT_AMBULATORY_CARE_PROVIDER_SITE_OTHER): Payer: Self-pay | Admitting: Family Medicine

## 2018-09-20 DIAGNOSIS — E1165 Type 2 diabetes mellitus with hyperglycemia: Secondary | ICD-10-CM

## 2018-09-20 DIAGNOSIS — Z794 Long term (current) use of insulin: Secondary | ICD-10-CM

## 2018-09-20 DIAGNOSIS — E782 Mixed hyperlipidemia: Secondary | ICD-10-CM

## 2018-09-20 MED ORDER — ATORVASTATIN CALCIUM 20 MG PO TABS: 20.0000 mg | ORAL_TABLET | Freq: Every day | ORAL | 3 refills | Status: DC

## 2018-09-20 MED ORDER — INSULIN GLARGINE 100 UNIT/ML SOLOSTAR PEN: 25.0000 [IU] | PEN_INJECTOR | Freq: Every day | SUBCUTANEOUS | 11 refills | Status: DC

## 2018-09-20 MED ORDER — BLOOD GLUCOSE MONITOR KIT: PACK | 0 refills | Status: DC

## 2018-09-20 MED ORDER — ASPIRIN EC 81 MG PO TBEC: 81.0000 mg | DELAYED_RELEASE_TABLET | Freq: Every day | ORAL | 3 refills | Status: DC

## 2018-09-20 MED ORDER — PEN NEEDLES 30G X 8 MM MISC: 1.0000 "pen " | Freq: Every day | 3 refills | Status: DC

## 2018-09-20 MED ORDER — METFORMIN HCL 500 MG PO TABS: 500.0000 mg | ORAL_TABLET | Freq: Two times a day (BID) | ORAL | 3 refills | Status: DC

## 2018-09-20 MED FILL — metFORMIN HCL 500 MG TABS: 500 | 30 days supply | Qty: 120 | Fill #0

## 2018-09-20 MED FILL — !LANTUS SOLOSTAR 100UNITS/M: 100 | 36 days supply | Qty: 9 | Fill #0

## 2018-09-20 MED FILL — TRUEplus 5-BEVEL PEN NEEDLE: 31G X 8 MM | 30 days supply | Qty: 100 | Fill #0

## 2018-09-20 MED FILL — ATORVASTATIN 20 MG TABLET: 20 | 30 days supply | Qty: 30 | Fill #0

## 2018-09-20 NOTE — Progress Notes (Deleted)
   New Patient Office Visit  Subjective:  Patient ID: Mercedes Valdez, female    DOB: 1995-08-24  Age: 23 y.o. MRN: [id number]  CC: No chief complaint on file.   HPI [name] presents for ***  Past Medical History:  Diagnosis Date  . ADHD (attention deficit hyperactivity disorder)   . Asthma   . Diabetes mellitus    Type 2  . Elevated blood pressure   . Obesity   . PCOS (polycystic ovarian syndrome)     Past Surgical History:  Procedure Laterality Date  . EXTERNAL EAR SURGERY     ear tubes    Family History  Problem Relation Age of Onset  . Diabetes Mother   . Vision loss Mother   . ADD / ADHD Brother   . Allergies Brother   . Asthma Brother   . Vision loss Brother   . Cancer Paternal Grandfather        Colon Cancer  . Heart disease Father 88       died from MI at age 19    Social History   Socioeconomic History  . Marital status: Single    Spouse name: Not on file  . Number of children: Not on file  . Years of education: 8  . Highest education level: Not on file  Occupational History  . Occupation: Ship broker  Social Needs  . Financial resource strain: Not on file  . Food insecurity:    Worry: Not on file    Inability: Not on file  . Transportation needs:    Medical: Not on file    Non-medical: Not on file  Tobacco Use  . Smoking status: Current Every Day Smoker    Packs/day: 0.20    Types: Cigarettes  . Smokeless tobacco: Never Used  Substance and Sexual Activity  . Alcohol use: Yes  . Drug use: Yes    Types: Marijuana  . Sexual activity: Not on file  Lifestyle  . Physical activity:    Days per week: Not on file    Minutes per session: Not on file  . Stress: Not on file  Relationships  . Social connections:    Talks on phone: Not on file    Gets together: Not on file    Attends religious service: Not on file    Active member of club or organization: Not on file    Attends meetings of clubs or organizations: Not on file   Relationship status: Not on file  . Intimate partner violence:    Fear of current or ex partner: Not on file    Emotionally abused: Not on file    Physically abused: Not on file    Forced sexual activity: Not on file  Other Topics Concern  . Not on file  Social History Narrative  . Not on file    ROS Review of Systems  Objective:   Today's Vitals: LMP 09/08/2018 (Exact Date)   Physical Exam  Assessment & Plan:   Problem List Items Addressed This Visit    None      No outpatient encounter medications on file as of 09/20/2018.   No facility-administered encounter medications on file as of 09/20/2018.     Follow-up: No follow-ups on file.   Molli Barrows, FNP

## 2018-09-20 NOTE — Progress Notes (Deleted)
Discuss lab test from 09/15/2018.

## 2018-09-21 NOTE — Progress Notes (Signed)
Virtual Visit via Telephone Note  I connected with Rip Harbour on 09/21/18 at 10:30 AM EDT by telephone and verified that I am speaking with the correct person using two identifiers.   I discussed the limitations, risks, security and privacy concerns of performing an evaluation and management service by telephone and the availability of in person appointments. I also discussed with the patient that there may be a patient responsible charge related to this service. The patient expressed understanding and agreed to proceed.   History of Present Illness: Diabetes follow-up: [facility] established care on 09/13/18. Medical history significant for Type 2 Diabetes and Obesity. Recently returned to the office for routine labs, including A1C. A1C is 14.2. Off diabetes treatment for more than 2 years. Renal and liver function are both normal. Lipid panel is significant for elevated LDL, total cholesterol, and elevated triglycerides. Today's phone visit is to discuss lab results and initiate management type 2 diabetes  and mixed hypercholesterolemia. Glucometer prescribed, check blood sugar at least twice daily    Assessment and Plan: 1. Type 2 diabetes mellitus with hyperglycemia, with long-term current use of insulin ([place]), uncontrolled and A1C 14.2 Start Lantus 25 units at bedtime Resume metformin 500 mg BID X 2 weeks, then increase to metformin 1000 mg BID Aim for 30 minutes of exercise most days, with a goal of 150 minutes per week. -Glucose monitoring at minimal of twice daily and keep a log of readings. -Commit to medication adherence and self-adjustment as needed -increase foods containing whole grains (one-half of grain intake). -saturated fat intake should be reduced -reduce intake of trans fat (lowers LDL cholesterol and increases HDL cholesterol) -Eat 4-5 small meals during the day to reduce the risk of becoming hungry.  2. Morbid obesity ([place]) Encouraged efforts to reduce weight  include engaging in physical activity as tolerated with goal of 150 minutes per week. Improve dietary choices and eat a meal regimen consistent with a Mediterranean or DASH diet. Reduce simple carbohydrates. Do not skip meals and eat healthy snacks throughout the day to avoid over-eating at dinner. Set a goal weight loss that is achievable for you.    3. Mixed hypercholesterolemia and hypertriglyceridemia Start Atorvastatin 20 mg once daily  Discussed the importance physical activity and dietary management   Follow Up Instructions: Return 8 weeks, repeat lipid, A1C, and LFT   I discussed the assessment and treatment plan with the patient. The patient was provided an opportunity to ask questions and all were answered. The patient agreed with the plan and demonstrated an understanding of the instructions.   The patient was advised to call back or seek an in-person evaluation if the symptoms worsen or if the condition fails to improve as anticipated.  I provided 15 minutes of non-face-to-face time during this encounter.   Molli Barrows, FNP

## 2018-09-23 MED FILL — TRUE METRIX TEST STRIP: 25 days supply | Qty: 100 | Fill #0

## 2018-09-23 MED FILL — TRUEplus LANCETS 28G MISC: 25 days supply | Qty: 100 | Fill #0

## 2018-09-23 MED FILL — !TRUE METRIX BLOOD GLUCOSE: 1 days supply | Qty: 1 | Fill #0

## 2018-10-29 ENCOUNTER — Telehealth: Payer: Self-pay

## 2018-10-29 NOTE — Telephone Encounter (Signed)
Called patient to do their pre-visit COVID screening.  Have you recently traveled internationally([place], [place], [place], [place], [place]) or within the [place] to a hotspot area([place], [place], [place], [place], [place])? no  Are you currently experiencing any of the following: fever, cough, SHOB, fatigue, body aches, loss of smell, rash, diarrhea, vomiting, severe headaches, weakness, sore throat? no  Have you been in contact with anyone who has recently travelled? no  Have you been in contact with anyone who is experiencing any of the above symptoms or been diagnosed with COVID  or works in or has recently visited a SNF? no  Reminded patient to come in fasting for repeat labs.

## 2018-11-01 ENCOUNTER — Encounter: Payer: Self-pay | Admitting: Family Medicine

## 2018-11-01 ENCOUNTER — Other Ambulatory Visit: Payer: Self-pay

## 2018-11-01 ENCOUNTER — Ambulatory Visit (INDEPENDENT_AMBULATORY_CARE_PROVIDER_SITE_OTHER): Payer: Self-pay | Admitting: Family Medicine

## 2018-11-01 VITALS — BP 112/79 | HR 94 | Temp 98.1°F | Resp 17 | Ht 63.0 in | Wt 221.4 lb

## 2018-11-01 DIAGNOSIS — Z23 Encounter for immunization: Secondary | ICD-10-CM

## 2018-11-01 DIAGNOSIS — Z794 Long term (current) use of insulin: Secondary | ICD-10-CM

## 2018-11-01 DIAGNOSIS — D75839 Thrombocytosis, unspecified: Secondary | ICD-10-CM

## 2018-11-01 DIAGNOSIS — Z6839 Body mass index (BMI) 39.0-39.9, adult: Secondary | ICD-10-CM

## 2018-11-01 DIAGNOSIS — D473 Essential (hemorrhagic) thrombocythemia: Secondary | ICD-10-CM

## 2018-11-01 DIAGNOSIS — E782 Mixed hyperlipidemia: Secondary | ICD-10-CM

## 2018-11-01 DIAGNOSIS — E1165 Type 2 diabetes mellitus with hyperglycemia: Secondary | ICD-10-CM

## 2018-11-01 MED ORDER — INSULIN GLARGINE 100 UNIT/ML SOLOSTAR PEN: 25.0000 [IU] | PEN_INJECTOR | Freq: Two times a day (BID) | SUBCUTANEOUS | 11 refills | Status: DC

## 2018-11-01 MED ORDER — METFORMIN HCL 500 MG PO TABS: 1000.0000 mg | ORAL_TABLET | Freq: Two times a day (BID) | ORAL | 3 refills | Status: DC

## 2018-11-01 MED ORDER — GLIPIZIDE 5 MG PO TABS: 2.5000 mg | ORAL_TABLET | Freq: Every day | ORAL | 2 refills | Status: DC

## 2018-11-01 MED FILL — !LANTUS SOLOSTAR 100UNITS/M: 100 | 30 days supply | Qty: 15 | Fill #0

## 2018-11-01 NOTE — Progress Notes (Signed)
Patient ID: Mercedes Valdez, female    DOB: Jul 28, 1995, 24 y.o.   MRN: [id number]  PCP: Scot Jun, FNP  Chief Complaint  Patient presents with  . Diabetes  . Hyperlipidemia    Subjective:  HPI  Mercedes Valdez is a 23 y.o. female presents for diabetes follow-up.  Mercedes Valdez has Asthma; PCOS (polycystic ovarian syndrome); Dysfunctional uterine bleeding; Musculoskeletal pain; Unspecified gastritis and gastroduodenitis without mention of hemorrhage; Type 2 diabetes mellitus ([place]); Irregular menses; Multiple drug allergies; Bacterial vaginosis; STI (sexually transmitted infection); Insulin dependent type 2 diabetes mellitus ([place]); and Psychosocial stressors on their problem list.   Diabetes Mercedes Valdez has uncontrolled type 2 diabetes. She had been without medication for at least 3 years prior to establishing care here and starting medications back in April. Initial A1C 14.2 prior to resuming medications. Today, reports home readings have been greater than 200 fasting. No readings in excess of 300. Current regimen includes Lantus 25 units at bedtime and metformin 500 mg BID. Endorses polyuria and increase thirst. She is trying to cut back on carbohydrates, however works for a [employer]. Endorses no exercise. Current Body mass index is 39.22 kg/m.  Social History   Socioeconomic History  . Marital status: Single    Spouse name: Not on file  . Number of children: Not on file  . Years of education: 65  . Highest education level: Not on file  Occupational History  . Occupation: Ship broker  Social Needs  . Financial resource strain: Not on file  . Food insecurity:    Worry: Not on file    Inability: Not on file  . Transportation needs:    Medical: Not on file    Non-medical: Not on file  Tobacco Use  . Smoking status: Current Every Day Smoker    Packs/day: 0.20    Types: Cigarettes  . Smokeless tobacco: Never Used  Substance and Sexual Activity  .  Alcohol use: Yes  . Drug use: Yes    Types: Marijuana  . Sexual activity: Not on file  Lifestyle  . Physical activity:    Days per week: Not on file    Minutes per session: Not on file  . Stress: Not on file  Relationships  . Social connections:    Talks on phone: Not on file    Gets together: Not on file    Attends religious service: Not on file    Active member of club or organization: Not on file    Attends meetings of clubs or organizations: Not on file    Relationship status: Not on file  . Intimate partner violence:    Fear of current or ex partner: Not on file    Emotionally abused: Not on file    Physically abused: Not on file    Forced sexual activity: Not on file  Other Topics Concern  . Not on file  Social History Narrative  . Not on file    Family History  Problem Relation Age of Onset  . Diabetes Mother   . Vision loss Mother   . ADD / ADHD Brother   . Allergies Brother   . Asthma Brother   . Vision loss Brother   . Cancer Paternal Grandfather        Colon Cancer  . Heart disease Father 51       died from MI at age 99     Review of Systems Pertinent negatives listed in HPI  Prior to Admission medications   Medication Sig Start Date End Date Taking? Authorizing Provider  aspirin EC 81 MG tablet Take 1 tablet (81 mg total) by mouth daily. 09/20/18  Yes Scot Jun, FNP  atorvastatin (LIPITOR) 20 MG tablet Take 1 tablet (20 mg total) by mouth daily. 09/20/18  Yes Scot Jun, FNP  blood glucose meter kit and supplies KIT Dispense based on patient and insurance preference. Use up to four times daily as directed. (FOR ICD-10 E.11.9) 09/20/18  Yes Scot Jun, FNP  Insulin Glargine (LANTUS SOLOSTAR) 100 UNIT/ML Solostar Pen Inject 25 Units into the skin 2 (two) times daily. 11/01/18  Yes Scot Jun, FNP  Insulin Pen Needle (PEN NEEDLES) 30G X 8 MM MISC 1 pen by Does not apply route daily. 09/20/18  Yes Scot Jun, FNP   metFORMIN (GLUCOPHAGE) 500 MG tablet Take 2 tablets (1,000 mg total) by mouth 2 (two) times daily with a meal. Week 3 increase 1000 mg (2 tablets) twice daily 11/01/18  Yes Scot Jun, FNP  glipiZIDE (GLUCOTROL) 5 MG tablet Take 0.5 tablets (2.5 mg total) by mouth daily before breakfast. 11/01/18   Scot Jun, FNP    Past Medical, Surgical Family and Social History reviewed and updated.    Objective:   Today's Vitals   11/01/18 1103  BP: 112/79  Pulse: 94  Resp: 17  Temp: 98.1 F (36.7 C)  TempSrc: Temporal  SpO2: 98%  Weight: 221 lb 6.4 oz (100.4 kg)  Height: _0  (1.6 m)    BP Readings from Last 3 Encounters:  11/01/18 112/79  09/15/18 (!) 131/91  04/07/18 119/65    Filed Weights   11/01/18 1103  Weight: 221 lb 6.4 oz (100.4 kg)       Physical Exam General appearance: alert, well developed, well nourished, cooperative and in no distress Head: Normocephalic, without obvious abnormality, atraumatic Respiratory: Respirations even and unlabored, normal respiratory rate Heart: rate and rhythm normal. No gallop or murmurs noted on exam  Extremities: No gross deformities Skin: Skin color, texture, turgor normal. No rashes seen  Psych: Appropriate mood and affect. Neurologic: Mental status: Alert, oriented to person, place, and time, thought content appropriate.  Lab Results  Component Value Date   POCGLU 397 (A) 09/25/2014   POCGLU 254 (A) 09/13/2014   POCGLU 438 (A) 09/05/2014    Lab Results  Component Value Date   HGBA1C 14.2 (H) 09/15/2018    Assessment & Plan:  1. Mixed hyperlipidemia Initiated statin therapy 6 weeks ago.  Patient currently takes atorvastatin 20 mg once daily.  Will repeat a fasting lipid panel  2. Type 2 diabetes mellitus with hyperglycemia, with long-term current use of insulin ([place]), prior A1C 14.2.  Home readings remain uncontrolled. Increase Lantus 25 BID. Adding Glipizide 0.5 mg once daily. Continue metformin. Goal A1C  <7. Repeat: Hemoglobin A1c and Comprehensive metabolic panel   3. Morbid obesity ([place]) Encouraged efforts to reduce weight include engaging in physical activity as tolerated with goal of 150 minutes per week. Improve dietary choices and eat a meal regimen consistent with a Mediterranean or DASH diet. Reduce simple carbohydrates. Do not skip meals and eat healthy snacks throughout the day to avoid over-eating at dinner. Set a goal weight loss that is achievable for you.  4. Thrombocytosis ([facility]) - CBC with Differential  5. Need for pneumococcal vaccination - Pneumococcal polysaccharide vaccine 23-valent greater than or equal to 2yo subcutaneous/IM  RTC: 6 weeks for DM  follow-up     Molli Barrows, FNP Primary Care at [facility] [address], [place] [phone]: [phone]

## 2018-11-02 LAB — COMPREHENSIVE METABOLIC PANEL
ALT: 10 IU/L (ref 0–32)
AST: 11 IU/L (ref 0–40)
Albumin/Globulin Ratio: 1.5 (ref 1.2–2.2)
Albumin: 4.4 g/dL (ref 3.9–5.0)
Alkaline Phosphatase: 88 IU/L (ref 39–117)
BUN/Creatinine Ratio: 16 (ref 9–23)
BUN: 7 mg/dL (ref 6–20)
Bilirubin Total: 0.4 mg/dL (ref 0.0–1.2)
CO2: 21 mmol/L (ref 20–29)
Calcium: 9.8 mg/dL (ref 8.7–10.2)
Chloride: 95 mmol/L — ABNORMAL LOW (ref 96–106)
Creatinine, Ser: 0.44 mg/dL — ABNORMAL LOW (ref 0.57–1.00)
GFR calc Af Amer: 162 mL/min/{1.73_m2} (ref >59)
GFR calc non Af Amer: 141 mL/min/{1.73_m2} (ref >59)
Globulin, Total: 3 g/dL (ref 1.5–4.5)
Glucose: 296 mg/dL — ABNORMAL HIGH (ref 65–99)
Potassium: 4.4 mmol/L (ref 3.5–5.2)
Sodium: 133 mmol/L — ABNORMAL LOW (ref 134–144)
Total Protein: 7.4 g/dL (ref 6.0–8.5)

## 2018-11-02 LAB — CBC WITH DIFFERENTIAL/PLATELET
Basophils Absolute: 0 10*3/uL (ref 0.0–0.2)
Basos: 0 %
EOS (ABSOLUTE): 0.2 10*3/uL (ref 0.0–0.4)
Eos: 2 %
Hematocrit: 40.2 % (ref 34.0–46.6)
Hemoglobin: 12.8 g/dL (ref 11.1–15.9)
Immature Grans (Abs): 0 10*3/uL (ref 0.0–0.1)
Immature Granulocytes: 0 %
Lymphocytes Absolute: 3.8 10*3/uL — ABNORMAL HIGH (ref 0.7–3.1)
Lymphs: 48 %
MCH: 25.7 pg — ABNORMAL LOW (ref 26.6–33.0)
MCHC: 31.8 g/dL (ref 31.5–35.7)
MCV: 81 fL (ref 79–97)
Monocytes Absolute: 0.4 10*3/uL (ref 0.1–0.9)
Monocytes: 5 %
Neutrophils Absolute: 3.6 10*3/uL (ref 1.4–7.0)
Neutrophils: 45 %
Platelets: 629 10*3/uL — ABNORMAL HIGH (ref 150–450)
RBC: 4.99 x10E6/uL (ref 3.77–5.28)
RDW: 14.3 % (ref 11.7–15.4)
WBC: 8 10*3/uL (ref 3.4–10.8)

## 2018-11-02 LAB — LIPID PANEL
Chol/HDL Ratio: 5.6 ratio — ABNORMAL HIGH (ref 0.0–4.4)
Cholesterol, Total: 230 mg/dL — ABNORMAL HIGH (ref 100–199)
HDL: 41 mg/dL (ref >39)
LDL Calculated: 161 mg/dL — ABNORMAL HIGH (ref 0–99)
Triglycerides: 140 mg/dL (ref 0–149)
VLDL Cholesterol Cal: 28 mg/dL (ref 5–40)

## 2018-11-02 LAB — HEMOGLOBIN A1C
Est. average glucose Bld gHb Est-mCnc: 346 mg/dL
Hgb A1c MFr Bld: 13.7 % — ABNORMAL HIGH (ref 4.8–5.6)

## 2018-11-02 MED FILL — glipiZIDE 5 MG TABS: 5 | 30 days supply | Qty: 15 | Fill #0

## 2018-11-04 ENCOUNTER — Encounter: Payer: Self-pay | Admitting: Family Medicine

## 2018-12-10 ENCOUNTER — Telehealth: Payer: Self-pay

## 2018-12-10 NOTE — Telephone Encounter (Signed)
Called patient to do their pre-visit COVID screening.  Call went to a voicemail that is not set up. Unable to do prescreening or leave voicemail.

## 2018-12-13 ENCOUNTER — Ambulatory Visit (INDEPENDENT_AMBULATORY_CARE_PROVIDER_SITE_OTHER): Payer: Self-pay | Admitting: Family Medicine

## 2018-12-13 ENCOUNTER — Encounter: Payer: Self-pay | Admitting: Family Medicine

## 2018-12-13 ENCOUNTER — Other Ambulatory Visit: Payer: Self-pay

## 2018-12-13 VITALS — BP 114/80 | HR 83 | Temp 97.3°F | Resp 17 | Ht 63.0 in | Wt 228.0 lb

## 2018-12-13 DIAGNOSIS — Z794 Long term (current) use of insulin: Secondary | ICD-10-CM

## 2018-12-13 DIAGNOSIS — E782 Mixed hyperlipidemia: Secondary | ICD-10-CM

## 2018-12-13 DIAGNOSIS — E1165 Type 2 diabetes mellitus with hyperglycemia: Secondary | ICD-10-CM

## 2018-12-13 LAB — GLUCOSE, POCT (MANUAL RESULT ENTRY): POC Glucose: 197 mg/dL — AB (ref 70–99)

## 2018-12-13 MED ORDER — ATORVASTATIN CALCIUM 20 MG PO TABS: 20.0000 mg | ORAL_TABLET | Freq: Every day | ORAL | 1 refills | Status: DC

## 2018-12-13 MED FILL — ATORVASTATIN 20 MG TABLET: 20 | 30 days supply | Qty: 30 | Fill #0

## 2018-12-13 NOTE — Patient Instructions (Signed)
Preventing Diabetes Mellitus Complications You can take action to prevent or slow down problems that are caused by diabetes (diabetes mellitus). Following your diabetes plan and taking care of yourself can reduce your risk of serious or life-threatening complications. What actions can I take to prevent diabetes complications? Manage your diabetes   Follow instructions from your health care providers about managing your diabetes. Your diabetes may be managed by a team of health care providers who can teach you how to care for yourself and can answer questions that you have.  Educate yourself about your condition so you can make healthy choices about eating and physical activity.  Check your blood sugar (glucose) levels as often as directed. Your health care provider will help you decide how often to check your blood glucose level depending on your treatment goals and how well you are meeting them.  Ask your health care provider if you should take low-dose aspirin daily and what dose is recommended for you. Taking low-dose aspirin daily is recommended to help prevent cardiovascular disease. Do not use nicotine or tobacco Do not use any products that contain nicotine or tobacco, such as cigarettes and e-cigarettes. If you need help quitting, ask your health care provider. Nicotine raises your risk for diabetes problems. If you quit using nicotine:  You will lower your risk for heart attack, stroke, nerve disease, and kidney disease.  Your cholesterol and blood pressure may improve.  Your blood circulation will improve. Keep your blood pressure under control Your personal target blood pressure is determined based on:  Your age.  Your medicines.  How long you have had diabetes.  Any other medical conditions you have. To control your blood pressure:  Follow instructions from your health care provider about meal planning, exercise, and medicines.  Make sure your health care provider  checks your blood pressure at every medical visit.  Monitor your blood pressure at home as told by your health care provider.  Keep your cholesterol under control To control your cholesterol:  Follow instructions from your health care provider about meal planning, exercise, and medicines.  Have your cholesterol checked at least once a year.  You may be prescribed medicine to lower cholesterol (statin). If you are not taking a statin, ask your health care provider if you should be. Controlling your cholesterol may:  Help prevent heart disease and stroke. These are the most common health problems for people with diabetes.  Improve your blood flow. Schedule and keep yearly physical exams and eye exams Your health care provider will tell you how often you need medical visits depending on your diabetes management plan. Keep all follow-up visits as directed. This is important so possible problems can be identified early and complications can be avoided or treated.  Every visit with your health care provider should include measuring your: ? Weight. ? Blood pressure. ? Blood glucose control.  Your A1c (hemoglobin A1c) level should be checked: ? At least 2 times a year, if you are meeting your treatment goals. ? 4 times a year, if you are not meeting treatment goals or if your treatment goals have changed.  Your blood lipids (lipid profile) should be checked yearly. You should also be checked yearly for protein in your urine (urine microalbumin).  If you have type 1 diabetes, get an eye exam 3-5 years after you are diagnosed, and then once a year after your first exam.  If you have type 2 diabetes, get an eye exam as soon as you  are diagnosed, and then once a year after your first exam. Keep your vaccines current It is recommended that you receive:  A flu (influenza) vaccine every year.  A pneumonia (pneumococcal) vaccine and a hepatitis B vaccine. If you are age 46 or older, you may  get the pneumonia vaccine as a series of two separate shots. Ask your health care provider which other vaccines may be recommended. Take care of your feet Diabetes may cause you to have poor blood circulation to your legs and feet. Because of this, taking care of your feet is very important. Diabetes can cause:  The skin on the feet to get thinner, break more easily, and heal more slowly.  Nerve damage in your legs and feet, which results in decreased feeling. You may not notice minor injuries that could lead to serious problems. To avoid foot problems:  Check your skin and feet every day for cuts, bruises, redness, blisters, or sores.  Schedule a foot exam with your health care provider once every year. This exam includes: ? Inspecting of the structure and skin of your feet. ? Checking the pulses and sensation in your feet.  Make sure that your health care provider performs a visual foot exam at every medical visit.  Take care of your teeth People with poorly controlled diabetes are more likely to have gum (periodontal) disease. Diabetes can make periodontal diseases harder to control. If not treated, periodontal diseases can lead to tooth loss. To prevent this:  Brush your teeth twice a day.  Floss at least once a day.  Visit your dentist 2 times a year. Drink responsibly Limit alcohol intake to no more than 1 drink a day for nonpregnant women and 2 drinks a day for men. One drink equals 12 oz of beer, 5 oz of wine, or 1 oz of hard liquor.  It is important to eat food when you drink alcohol to avoid low blood glucose (hypoglycemia). Avoid alcohol if you:  Have a history of alcohol abuse or dependence.  Are pregnant.  Have liver disease, pancreatitis, advanced neuropathy, or severe hypertriglyceridemia. Lessen stress Living with diabetes can be stressful. When you are experiencing stress, your blood glucose may be affected in two ways:  Stress hormones may cause your blood  glucose to rise.  You may be distracted from taking good care of yourself. Be aware of your stress level and make changes to help you manage challenging situations. To lower your stress levels:  Consider joining a support group.  Do planned relaxation or meditation.  Do a hobby that you enjoy.  Maintain healthy relationships.  Exercise regularly.  Work with your health care provider or a mental health professional. Summary  You can take action to prevent or slow down problems that are caused by diabetes (diabetes mellitus). Following your diabetes plan and taking care of yourself can reduce your risk of serious or life-threatening complications.  Follow instructions from your health care providers about managing your diabetes. Your diabetes may be managed by a team of health care providers who can teach you how to care for yourself and can answer questions that you have.  Your health care provider will tell you how often you need medical visits depending on your diabetes management plan. Keep all follow-up visits as directed. This is important so possible problems can be identified early and complications can be avoided or treated. This information is not intended to replace advice given to you by your health care provider. Make sure  you discuss any questions you have with your health care provider. Document Released: 01/28/2011 Document Revised: 08/10/2017 Document Reviewed: 02/09/2016 Elsevier Patient Education  2020 [employer].

## 2018-12-13 NOTE — Progress Notes (Signed)
Established Patient Office Visit  Subjective:  Patient ID: Mercedes Valdez, female    DOB: 1995-11-16  Age: 23 y.o. MRN: [id number]  CC:  Chief Complaint  Patient presents with  . Diabetes  . Hyperlipidemia    HPI Mercedes Valdez presents for follow-up of uncontrolled diabetes with most recent hemoglobin A1c, of 13.7 on 11/01/2018 and prior to that time hemoglobin A1c was 14.2 on 09/15/2018.  Patient reports that she is compliant with her Lantus taking 25 units twice daily currently.  She admits that she sometimes does not take the metformin and may only take it 2-3 times per week as it does cause her to have diarrhea.  She has not had any symptoms of hypoglycemia.  Patient reports that she is not checking her blood sugars at home due to her work hours that she states that she is either working or sleep.  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  She states that her schedule tends to change and that tonight she goes in 6 PM and will not get off from work until 4 AM.  She reports some occasional mild swelling at the ankles at the end of her work shift.  She denies current increased thirst or urinary frequency.  She denies any numbness or tingling in her feet.  She denies any blurred vision.  She is not currently taking her cholesterol medication.  She states that after her recent refills ran out she did not refill the medication.  She is not sure how many months she has been without cholesterol medication.  When she was taking her cholesterol medication, she did not have any side effects and no increased muscle or joint pain with the use of atorvastatin.  Past Medical History:  Diagnosis Date  . ADHD (attention deficit hyperactivity disorder)   . Asthma   . Diabetes mellitus    Type 2  . Elevated blood pressure   . Obesity   . PCOS (polycystic ovarian syndrome)     Past Surgical History:  Procedure Laterality Date  . EXTERNAL EAR SURGERY     ear tubes    Family History   Problem Relation Age of Onset  . Diabetes Mother   . Vision loss Mother   . ADD / ADHD Brother   . Allergies Brother   . Asthma Brother   . Vision loss Brother   . Cancer Paternal Grandfather        Colon Cancer  . Heart disease Father 57       died from MI at age 89    Social History   Tobacco Use  . Smoking status: Current Every Day Smoker    Packs/day: 0.20    Types: Cigarettes  . Smokeless tobacco: Never Used  Substance Use Topics  . Alcohol use: Yes  . Drug use: Yes    Types: Marijuana    Outpatient Medications Prior to Visit  Medication Sig Dispense Refill  . aspirin EC 81 MG tablet Take 1 tablet (81 mg total) by mouth daily. 30 tablet 3  . blood glucose meter kit and supplies KIT Dispense based on patient and insurance preference. Use up to four times daily as directed. (FOR ICD-10 E.11.9) 1 each 0  . glipiZIDE (GLUCOTROL) 5 MG tablet Take 0.5 tablets (2.5 mg total) by mouth daily before breakfast. 30 tablet 2  . Insulin Glargine (LANTUS SOLOSTAR) 100 UNIT/ML Solostar Pen Inject 25 Units into the skin 2 (two) times daily. 15  mL 11  . Insulin Pen Needle (PEN NEEDLES) 30G X 8 MM MISC 1 pen by Does not apply route daily. 100 each 3  . metFORMIN (GLUCOPHAGE) 500 MG tablet Take 2 tablets (1,000 mg total) by mouth 2 (two) times daily with a meal. Week 3 increase 1000 mg (2 tablets) twice daily 180 tablet 3  . atorvastatin (LIPITOR) 20 MG tablet Take 1 tablet (20 mg total) by mouth daily. (Patient not taking: Reported on 12/13/2018) 90 tablet 3   No facility-administered medications prior to visit.     Allergies  Allergen Reactions  . Amoxicillin Hives  . Augmentin [Amoxicillin-Pot Clavulanate] Hives  . Penicillins Hives    Has patient had a PCN reaction causing immediate rash, facial/tongue/throat swelling, SOB or lightheadedness with hypotension: No Has patient had a PCN reaction causing severe rash involving mucus membranes or skin necrosis: No Has patient had a PCN  reaction that required hospitalization No Has patient had a PCN reaction occurring within the last 10 years: No If all of the above answers are "NO", then may proceed with Cephalosporin use.  . Suprax [Cefixime]     ROS Review of Systems  Constitutional: Positive for fatigue. Negative for chills, diaphoresis and fever.  HENT: Negative for sore throat and trouble swallowing.   Eyes: Negative for photophobia and visual disturbance.  Respiratory: Negative for cough and shortness of breath.   Cardiovascular: Negative for chest pain, palpitations and leg swelling.  Gastrointestinal: Negative for abdominal pain, constipation, diarrhea and nausea.  Endocrine: Negative for polydipsia, polyphagia and polyuria.  Genitourinary: Negative for dysuria and frequency.  Musculoskeletal: Negative for arthralgias, back pain and gait problem.  Neurological: Negative for dizziness and headaches.  Hematological: Negative for adenopathy. Does not bruise/bleed easily.  Psychiatric/Behavioral: Negative for self-injury and suicidal ideas.      Objective:    Physical Exam  Constitutional: She is oriented to person, place, and time. She appears well-developed and well-nourished.  Patient younger adult female in no acute distress  Neck: Normal range of motion. Neck supple. No JVD present. No thyromegaly present.  Patient with some fullness in the anterior neck/borderline thyromegaly  Cardiovascular: Normal rate and regular rhythm.  Pulmonary/Chest: Effort normal and breath sounds normal.  Abdominal: Soft. There is no abdominal tenderness. There is no rebound and no guarding.  Truncal obesity  Musculoskeletal: Normal range of motion.        General: No tenderness or edema.     Comments: No CVA tenderness  Lymphadenopathy:    She has no cervical adenopathy.  Neurological: She is alert and oriented to person, place, and time.  Skin: Skin is warm and dry.  No active skin breakdown on the feet.  She does have  some dry skin on the dorsal and plantar surfaces of the feet  Psychiatric: She has a normal mood and affect. Her behavior is normal.  Nursing note and vitals reviewed.   BP 114/80   Pulse 83   Temp (!) 97.3 F (36.3 C) (Temporal)   Resp 17   Ht 5' 3"  (1.6 m)   Wt 228 lb (103.4 kg)   SpO2 98%   BMI 40.39 kg/m  Wt Readings from Last 3 Encounters:  12/13/18 228 lb (103.4 kg)  11/01/18 221 lb 6.4 oz (100.4 kg)  09/15/18 217 lb 12.8 oz (98.8 kg)     Health Maintenance Due  Topic Date Due  . FOOT EXAM  09/11/2003  . PAP-Cervical Cytology Screening  09/11/2014  . PAP SMEAR-Modifier  09/11/2014    There are no preventive care reminders to display for this patient.  Lab Results  Component Value Date   TSH 2.160 09/15/2018   Lab Results  Component Value Date   WBC 8.0 11/01/2018   HGB 12.8 11/01/2018   HCT 40.2 11/01/2018   MCV 81 11/01/2018   PLT 629 (H) 11/01/2018   Lab Results  Component Value Date   NA 133 (L) 11/01/2018   K 4.4 11/01/2018   CO2 21 11/01/2018   GLUCOSE 296 (H) 11/01/2018   BUN 7 11/01/2018   CREATININE 0.44 (L) 11/01/2018   BILITOT 0.4 11/01/2018   ALKPHOS 88 11/01/2018   AST 11 11/01/2018   ALT 10 11/01/2018   PROT 7.4 11/01/2018   ALBUMIN 4.4 11/01/2018   CALCIUM 9.8 11/01/2018   ANIONGAP 14 07/07/2017   Lab Results  Component Value Date   CHOL 230 (H) 11/01/2018   Lab Results  Component Value Date   HDL 41 11/01/2018   Lab Results  Component Value Date   LDLCALC 161 (H) 11/01/2018   Lab Results  Component Value Date   TRIG 140 11/01/2018   Lab Results  Component Value Date   CHOLHDL 5.6 (H) 11/01/2018   Lab Results  Component Value Date   HGBA1C 13.7 (H) 11/01/2018      Assessment & Plan:  1. Type 2 diabetes mellitus with hyperglycemia, with long-term current use of insulin ([place]) Patient is nonfasting and her glucose at today's visit was 197.  Discussed the importance of monitoring blood sugars so that medications  can be adjusted to help better control her diabetes and lower her A1c.  Patient agrees to referral to the clinical pharmacist but she was also made aware that she needs to monitor her blood sugars and keep a blood sugar diary and bring her glucometer and blood sugar diary to her visit with the clinical pharmacist.  She was asked to try lowering her dose of metformin to 500 mg twice daily to see if this will help with the GI symptoms she is experiencing with thousand milligrams twice daily dosing as she reports that she is not taking the medication as prescribed/skips doses secondary to GI upset from metformin.  Patient will continue her current Lantus 25 units twice daily. - Glucose (CBG) - atorvastatin (LIPITOR) 20 MG tablet; Take 1 tablet (20 mg total) by mouth daily. To lower cholesterol  Dispense: 90 tablet; Refill: 1 - Amb Referral to Clinical Pharmacist  2. Mixed hyperlipidemia Refill provided of atorvastatin for hyperlipidemia.  Patient had lipid panel done 11/01/2018 with total cholesterol of 230 and LDL of 161.  Low-fat diet and compliance with medication encouraged. - atorvastatin (LIPITOR) 20 MG tablet; Take 1 tablet (20 mg total) by mouth daily. To lower cholesterol  Dispense: 90 tablet; Refill: 1  An After Visit Summary was printed and given to the patient.   Follow-up: Return in about 2 months (around 02/13/2019) for DM- clinical pharmacist;.   Antony Blackbird, MD

## 2019-01-05 ENCOUNTER — Other Ambulatory Visit: Payer: Self-pay

## 2019-01-05 ENCOUNTER — Emergency Department (HOSPITAL_COMMUNITY)
Admission: EM | Admit: 2019-01-05 | Discharge: 2019-01-05 | Disposition: A | Discharge status: Home / Self Care | Payer: Self-pay | Attending: Emergency Medicine | Admitting: Emergency Medicine

## 2019-01-05 ENCOUNTER — Encounter (HOSPITAL_COMMUNITY): Payer: Self-pay | Admitting: Emergency Medicine

## 2019-01-05 DIAGNOSIS — Z79899 Other long term (current) drug therapy: Secondary | ICD-10-CM | POA: Insufficient documentation

## 2019-01-05 DIAGNOSIS — E119 Type 2 diabetes mellitus without complications: Secondary | ICD-10-CM | POA: Insufficient documentation

## 2019-01-05 DIAGNOSIS — F17210 Nicotine dependence, cigarettes, uncomplicated: Secondary | ICD-10-CM | POA: Insufficient documentation

## 2019-01-05 DIAGNOSIS — L02411 Cutaneous abscess of right axilla: Secondary | ICD-10-CM | POA: Insufficient documentation

## 2019-01-05 DIAGNOSIS — Z794 Long term (current) use of insulin: Secondary | ICD-10-CM | POA: Insufficient documentation

## 2019-01-05 DIAGNOSIS — Z7982 Long term (current) use of aspirin: Secondary | ICD-10-CM | POA: Insufficient documentation

## 2019-01-05 DIAGNOSIS — J45909 Unspecified asthma, uncomplicated: Secondary | ICD-10-CM | POA: Insufficient documentation

## 2019-01-05 MED ORDER — CLINDAMYCIN HCL 150 MG PO CAPS: 450.0000 mg | ORAL_CAPSULE | Freq: Three times a day (TID) | ORAL | 0 refills | Status: AC

## 2019-01-05 MED ORDER — LIDOCAINE-EPINEPHRINE (PF) 2 %-1:200000 IJ SOLN
10.0000 mL | Freq: Once | INTRAMUSCULAR | Status: AC
  Administered 2019-01-05: 10 mL
  Filled 2019-01-05: qty 20

## 2019-01-05 NOTE — ED Notes (Signed)
Patient Alert and oriented to baseline. Stable and ambulatory to baseline. Patient verbalized understanding of the discharge instructions.  Patient belongings were taken by the patient.   

## 2019-01-05 NOTE — ED Provider Notes (Signed)
[place] EMERGENCY DEPARTMENT Provider Note   CSN: [id number] Arrival date & time: 01/05/19  1730    History   Chief Complaint No chief complaint on file.   HPI Mercedes Valdez is a 23 y.o. female history of diabetes, obesity, asthma presents today for right axillary abscess.  Patient reports multiple history of abscesses in the past requiring incision and drainage, she denies prior surgical intervention for abscess.  She reports 1 week of increasing swelling and pain to her right axilla moderate throbbing constant worsened with movement and palpation and without alleviating factors.  Patient began taking clindamycin yesterday for this abscess that she had leftover from previous abscess, she reports taking 3 doses thus far.  She denies any fever/chills, nausea/vomiting, abdominal pain, diarrhea, numbness/weakness, tingling or any additional concerns.  She reports compliance with her diabetic medications.     HPI  Past Medical History:  Diagnosis Date  . ADHD (attention deficit hyperactivity disorder)   . Asthma   . Diabetes mellitus    Type 2  . Elevated blood pressure   . Obesity   . PCOS (polycystic ovarian syndrome)     Patient Active Problem List   Diagnosis Date Noted  . Psychosocial stressors 09/26/2014  . Insulin dependent type 2 diabetes mellitus ([place]) 09/06/2014  . Bacterial vaginosis 09/05/2014  . STI (sexually transmitted infection) 09/05/2014  . Multiple drug allergies 02/27/2014  . Musculoskeletal pain 04/08/2013  . Unspecified gastritis and gastroduodenitis without mention of hemorrhage 04/08/2013  . Type 2 diabetes mellitus ([place]) 04/08/2013  . Irregular menses 04/08/2013  . Dysfunctional uterine bleeding 11/09/2012  . PCOS (polycystic ovarian syndrome) 09/29/2012  . Asthma     Past Surgical History:  Procedure Laterality Date  . EXTERNAL EAR SURGERY     ear tubes     OB History   No obstetric history on file.      Home  Medications    Prior to Admission medications   Medication Sig Start Date End Date Taking? Authorizing Provider  aspirin EC 81 MG tablet Take 1 tablet (81 mg total) by mouth daily. 09/20/18   Scot Jun, FNP  atorvastatin (LIPITOR) 20 MG tablet Take 1 tablet (20 mg total) by mouth daily. To lower cholesterol 12/13/18   Fulp, Cammie, MD  blood glucose meter kit and supplies KIT Dispense based on patient and insurance preference. Use up to four times daily as directed. (FOR ICD-10 E.11.9) 09/20/18   Scot Jun, FNP  clindamycin (CLEOCIN) 150 MG capsule Take 3 capsules (450 mg total) by mouth 3 (three) times daily for 7 days. 01/05/19 01/12/19  Nuala Alpha A, PA-C  glipiZIDE (GLUCOTROL) 5 MG tablet Take 0.5 tablets (2.5 mg total) by mouth daily before breakfast. 11/01/18   Scot Jun, FNP  Insulin Glargine (LANTUS SOLOSTAR) 100 UNIT/ML Solostar Pen Inject 25 Units into the skin 2 (two) times daily. 11/01/18   Scot Jun, FNP  Insulin Pen Needle (PEN NEEDLES) 30G X 8 MM MISC 1 pen by Does not apply route daily. 09/20/18   Scot Jun, FNP  metFORMIN (GLUCOPHAGE) 500 MG tablet Take 2 tablets (1,000 mg total) by mouth 2 (two) times daily with a meal. Week 3 increase 1000 mg (2 tablets) twice daily 11/01/18   Scot Jun, FNP    Family History Family History  Problem Relation Age of Onset  . Diabetes Mother   . Vision loss Mother   . ADD / ADHD Brother   .  Allergies Brother   . Asthma Brother   . Vision loss Brother   . Cancer Paternal Grandfather        Colon Cancer  . Heart disease Father 8       died from MI at age 1    Social History Social History   Tobacco Use  . Smoking status: Current Every Day Smoker    Packs/day: 0.20    Types: Cigarettes  . Smokeless tobacco: Never Used  Substance Use Topics  . Alcohol use: Yes  . Drug use: Yes    Types: Marijuana     Allergies   Amoxicillin, Augmentin [amoxicillin-pot clavulanate],  Penicillins, and Suprax [cefixime]   Review of Systems Review of Systems Ten systems are reviewed and are negative for acute change except as noted in the HPI  Physical Exam Updated Vital Signs BP (!) 126/95   Pulse (!) 109   Temp 98.3 F (36.8 C) (Oral)   Resp 18   SpO2 100%   Physical Exam Constitutional:      General: She is not in acute distress.    Appearance: Normal appearance. She is well-developed. She is obese. She is not ill-appearing or diaphoretic.  HENT:     Head: Normocephalic and atraumatic.     Right Ear: External ear normal.     Left Ear: External ear normal.     Nose: Nose normal.  Eyes:     General: Vision grossly intact. Gaze aligned appropriately.     Pupils: Pupils are equal, round, and reactive to light.  Neck:     Musculoskeletal: Normal range of motion.     Trachea: Trachea and phonation normal. No tracheal deviation.  Pulmonary:     Effort: Pulmonary effort is normal. No respiratory distress.  Abdominal:     General: There is no distension.     Palpations: Abdomen is soft.     Tenderness: There is no abdominal tenderness. There is no guarding or rebound.  Musculoskeletal: Normal range of motion.  Skin:    General: Skin is warm and dry.          Comments: Large approximately 5 cm in diameter fluctuant abscess of the right axilla with mild surrounding erythema.  Tender to palpation.  Neurological:     Mental Status: She is alert.     GCS: GCS eye subscore is 4. GCS verbal subscore is 5. GCS motor subscore is 6.     Comments: Speech is clear and goal oriented, follows commands Major Cranial nerves without deficit, no facial droop Normal strength in upper ext strong and equal grip strength Sensation normal to light and sharp touch Moves extremities without ataxia, coordination intact Normal gait  Psychiatric:        Behavior: Behavior normal.      ED Treatments / Results  Labs (all labs ordered are listed, but only abnormal results are  displayed) Labs Reviewed  POC URINE PREG, ED    EKG None  Radiology No results found.  Procedures .Marland KitchenIncision and Drainage  Date/Time: 01/05/2019 9:45 PM Performed by: Deliah Boston, PA-C Authorized by: Deliah Boston, PA-C   Consent:    Consent obtained:  Verbal   Consent given by:  Patient   Risks discussed:  Bleeding, incomplete drainage, pain, damage to other organs and infection   Alternatives discussed:  No treatment Universal protocol:    Procedure explained and questions answered to patient or proxy's satisfaction: yes     Relevant documents present and verified:  yes     Required blood products, implants, devices, and special equipment available: yes     Site/side marked: yes     Immediately prior to procedure a time out was called: yes     Patient identity confirmed:  Verbally with patient and arm band Location:    Type:  Abscess   Size:  5cm   Location: Right Axilla. Pre-procedure details:    Skin preparation:  Betadine Anesthesia (see MAR for exact dosages):    Anesthesia method:  Local infiltration   Local anesthetic:  Lidocaine 2% WITH epi Procedure type:    Complexity:  Simple Procedure details:    Needle aspiration: yes     Needle size:  25 G   Incision types:  Single straight   Incision depth:  Subcutaneous   Scalpel blade:  11   Wound management:  Probed and deloculated, irrigated with saline and extensive cleaning   Drainage:  Purulent   Drainage amount:  Moderate   Packing materials:  1/4 in gauze   Amount 1/4":  5cm Post-procedure details:    Patient tolerance of procedure:  Tolerated well, no immediate complications   (including critical care time)  Medications Ordered in ED Medications  lidocaine-EPINEPHrine (XYLOCAINE W/EPI) 2 %-1:200000 (PF) injection 10 mL (10 mLs Infiltration Given 01/05/19 2006)     Initial Impression / Assessment and Plan / ED Course  I have reviewed the triage vital signs and the nursing notes.   Pertinent labs & imaging results that were available during my care of the patient were reviewed by me and considered in my medical decision making (see chart for details).     Patient with right axillary single skin abscess amenable to incision and drainage.  Packing placed as above. Patient informed to have wound recheck and packing change in 2 days. I have encouraged home warm soaks and flushing.  Mild signs of cellulitis is surrounding skin.  Patient arrived tachycardic suspect secondary to pain, reassess patient post procedure heart rate now in the 90s patient reports she is feeling improved and would like to leave.  Afebrile well-appearing and in no acute distress, does not appear dehydrated.  She has been taking diabetic medication at home, do not suspect DKA or SIRS at this time.  As patient started clindamycin yesterday will continue this therapy due to her surrounding cellulitis and size of her abscess, she was unable to provide a urine pregnancy test today, she denies chance of pregnancy and does not wish to be tested.  I informed patient that she should use additional contraceptive measures while taking antibiotics.  At this time there does not appear to be any evidence of an acute emergency medical condition and the patient appears stable for discharge with appropriate outpatient follow up. Diagnosis was discussed with patient who verbalizes understanding of care plan and is agreeable to discharge. I have discussed return precautions with patient who verbalizes understanding of return precautions. Patient encouraged to follow-up with their PCP. All questions answered. Patient has been discharged in good condition.  Note: Portions of this report may have been transcribed using voice recognition software. Every effort was made to ensure accuracy; however, inadvertent computerized transcription errors may still be present. Final Clinical Impressions(s) / ED Diagnoses   Final diagnoses:  Abscess  of right axilla    ED Discharge Orders         Ordered    clindamycin (CLEOCIN) 150 MG capsule  3 times daily     01/05/19 2151  Gari Crown 01/05/19 2242    Quintella Reichert, MD 01/06/19 1216

## 2019-01-05 NOTE — Discharge Instructions (Signed)
You have been diagnosed today with Abscess of Right Armpit.  At this time there does not appear to be the presence of an emergent medical condition, however there is always the potential for conditions to change. Please read and follow the below instructions.  Please return to the Emergency Department immediately for any new or worsening symptoms. Please be sure to follow up with your Primary Care Provider within one week regarding your visit today; please call their office to schedule an appointment even if you are feeling better for a follow-up visit. Please take your antibiotic clindamycin as prescribed 3 pills, 3 times daily for 7 days.  Please continue using warm compresses to facilitate continued drainage from your abscess.  Please follow-up for wound recheck in 2 days and for packing change in 2 days.  You may follow-up here at the emergency department or at an urgent care to have this performed.  You may also follow-up with your primary care provider if possible for wound check and packing change in 2 days. Antibiotics such as clindamycin may lower the effectiveness of birth control, please use additional contraceptive measures while taking antibiotics to avoid pregnancy.  Do not take antibiotics if you think you may be pregnant.  Get help right away if: You have very bad (severe) pain. You see red streaks on your skin spreading away from the abscess. You have fever or chills You have nausea or vomiting You have abdominal pain You have headache or confusion You have any new/concerning or worsening symptoms  Please read the additional information packets attached to your discharge summary.  Do not take your medicine if  develop an itchy rash, swelling in your mouth or lips, or difficulty breathing; call 911 and seek immediate emergency medical attention if this occurs.

## 2019-01-05 NOTE — ED Triage Notes (Signed)
Pt here from home with c/o abscess under her right arm , history of same

## 2019-01-19 ENCOUNTER — Other Ambulatory Visit: Payer: Self-pay | Admitting: Family Medicine

## 2019-01-19 ENCOUNTER — Ambulatory Visit: Payer: Self-pay | Attending: Family Medicine | Admitting: Pharmacist

## 2019-01-19 ENCOUNTER — Encounter: Payer: Self-pay | Admitting: Pharmacist

## 2019-01-19 ENCOUNTER — Other Ambulatory Visit: Payer: Self-pay

## 2019-01-19 DIAGNOSIS — E1165 Type 2 diabetes mellitus with hyperglycemia: Secondary | ICD-10-CM

## 2019-01-19 DIAGNOSIS — Z794 Long term (current) use of insulin: Secondary | ICD-10-CM

## 2019-01-19 MED ORDER — TRULICITY 0.75 MG/0.5ML ~~LOC~~ SOAJ: 0.7500 mg | SUBCUTANEOUS | 2 refills | Status: DC

## 2019-01-19 MED ORDER — LANTUS SOLOSTAR 100 UNIT/ML ~~LOC~~ SOPN: 30.0000 [IU] | PEN_INJECTOR | Freq: Every day | SUBCUTANEOUS | 2 refills | Status: DC

## 2019-01-19 MED ORDER — FLUCONAZOLE 150 MG PO TABS: 150.0000 mg | ORAL_TABLET | Freq: Once | ORAL | 0 refills | Status: AC

## 2019-01-19 MED FILL — ATORVASTATIN 20 MG TABLET: 20 | 30 days supply | Qty: 30 | Fill #0

## 2019-01-19 MED FILL — TRULICITY 0.75 MG/0.5 ML PE: 0.75 | 28 days supply | Qty: 2 | Fill #0

## 2019-01-19 MED FILL — !LANTUS SOLOSTAR 100UNITS/M: 100 | 10 days supply | Qty: 3 | Fill #0

## 2019-01-19 NOTE — Patient Instructions (Signed)
Thank you for coming to see me today. Please do the following:  1. Increase Lantus to 30 units daily.  2. Start Trulicity [id number] mg weekly.  3. Stop metformin and glipizide.  4. Continue checking blood sugars at home. 5. Continue making the lifestyle changes we've discussed together during our visit. Diet and exercise play a significant role in improving your blood sugars.  6. Follow-up with your pcp next month.   Hypoglycemia or low blood sugar:   Low blood sugar can happen quickly and may become an emergency if not treated right away.   While this shouldn't happen often, it can be brought upon if you skip a meal or do not eat enough. Also, if your insulin or other diabetes medications are dosed too high, this can cause your blood sugar to go to low.   Warning signs of low blood sugar include: 1. Feeling shaky or dizzy 2. Feeling weak or tired  3. Excessive hunger 4. Feeling anxious or upset  5. Sweating even when you aren't exercising  What to do if I experience low blood sugar? 1. Check your blood sugar with your meter. If lower than 70, proceed to step 2.  2. Treat with 3-4 glucose tablets or 3 packets of regular sugar. If these aren't around, you can try hard candy. Yet another option would be to drink 4 ounces of fruit juice or 6 ounces of REGULAR soda.  3. Re-check your sugar in 15 minutes. If it is still below 70, do what you did in step 2 again. If has come back up, go ahead and eat a snack or small meal at this time.

## 2019-01-19 NOTE — Progress Notes (Signed)
Patient ID: Mercedes Valdez, female   DOB: November 19, 1995, 23 y.o.   MRN: [id number]   Patient seen today by clinical pharmacist in follow-up of uncontrolled diabetes and patient requesting RX for diflucan for possible yeast infection per message from clinical pharmacist. RX for diflucan will be sent to pharmacy

## 2019-01-19 NOTE — Progress Notes (Signed)
S:    PCP: Dr. Chapman Fitch No chief complaint on file.  Patient arrives in good spirits.  Presents for diabetes evaluation, education, and management Patient was referred and last seen by Primary Care Provider on 12/13/18.   Patient reports diabetes was diagnosed at age 23.   Family/Social History:  - FHx: DM (mother), vision loss (mother, brother), heart disease (father/deceased - MI at 41 YO) - Tobacco: current every day smoker 0.2 PPD - Alcohol: denies use  Insurance coverage/medication affordability: no insurance  Patient denies adherence with medications.  Current diabetes medications include: glipizide 2.5 mg daily (takes 5mg ), metformin 500 mg BID (stopped d/t diarrhea), Lantus 25 units BID (sometimes forgets evening dose) Current hypertension medications include: none Current hyperlipidemia medications include: atorvastatin 20 mg daily   Patient denies hypoglycemic events.  Patient reported dietary habits:  - Reports trying to start a vegetarian diet - Admits to pink lemonade this AM but does try to drink diet or sugar free at home  Patient-reported exercise habits:  - Walks on her days off   Patient reports nocturia, polydipsia.  Patient reports occasional neuropathy. Patient denies visual changes. Patient denies self foot exams.    O:  POCT: 370  Lab Results  Component Value Date   HGBA1C 13.7 (H) 11/01/2018   There were no vitals filed for this visit.  Lipid Panel     Component Value Date/Time   CHOL 230 (H) 11/01/2018 1127   TRIG 140 11/01/2018 1127   HDL 41 11/01/2018 1127   CHOLHDL 5.6 (H) 11/01/2018 1127   CHOLHDL 4.7 04/11/2013 0819   VLDL 33 04/11/2013 0819   LDLCALC 161 (H) 11/01/2018 1127   Home fasting CBG: has not checked at all  Clinical ASCVD: No  The ASCVD Risk score Mikey Bussing DC Jr., et al., 2013) failed to calculate for the following reasons:   The 2013 ASCVD risk score is only valid for ages 1 to 22   A/P: Diabetes longstanding  currently uncontrolled. Patient is able to verbalize appropriate hypoglycemia management plan. Patient is not adherent with medication. Control is suboptimal due to medication noncompliance and dietary indiscretion. She is very nonchalant concerning her DM control and the morbidity/mortality that DM carries with it.    She is not adherent to BID insulin and has stopped metformin. She requires improved glycemic control but has no home data to provide me with. Will have her inject insulin once daily and increase to 30 units. In addition, pt believes that a weekly injectable will aid in improving adherence. Will start Trulicity today.   -Started Lantus 30 units daily.  -Start Trulicity [id number] mg weekly.  -Cannot tolerate metformin. -Stop glipizide.  -Extensively discussed pathophysiology of DM, recommended lifestyle interventions, dietary effects on glycemic control -Counseled on s/sx of and management of hypoglycemia -Next A1C anticipated 9/20.   ASCVD risk - primary prevention in patient with DM. Last LDL is not controlled. ASCVD risk score cannot be calculated d/t age. However, pt has ASCVD risk facotrs including DM , HLD, fhx of early ASCVD-related death, and tobacco abuse. I recommend increase in atorvastatin to high intensity. Will defer to PCP. -Continued atorvastatin 20 mg.   HM:  - Fluarix given  Written patient instructions provided.  Total time in face to face counseling 30 minutes.   Follow up PCP Clinic Visit next month.     Patient seen with: Dillard Essex PharmD Candidate  Class of [place]  Patsey Berthold, PharmD, [place] [phone]

## 2019-01-21 ENCOUNTER — Other Ambulatory Visit: Payer: Self-pay | Admitting: Pharmacist

## 2019-01-21 MED ORDER — FLUCONAZOLE 150 MG PO TABS: 150.0000 mg | ORAL_TABLET | Freq: Once | ORAL | 0 refills | Status: AC

## 2019-01-21 MED FILL — FLUCONAZOLE 150 MG TABLET: 150 | 1 days supply | Qty: 1 | Fill #0

## 2019-01-21 NOTE — Progress Notes (Signed)
Per PCP, order for Diflucan did not go through. Resending on behalf of Dr. Chapman Fitch to [facility] pharmacy.

## 2019-01-26 ENCOUNTER — Emergency Department (HOSPITAL_COMMUNITY)
Admission: EM | Admit: 2019-01-26 | Discharge: 2019-01-26 | Disposition: A | Discharge status: Home / Self Care | Payer: Self-pay | Attending: Emergency Medicine | Admitting: Emergency Medicine

## 2019-01-26 ENCOUNTER — Other Ambulatory Visit: Payer: Self-pay

## 2019-01-26 ENCOUNTER — Encounter (HOSPITAL_COMMUNITY): Payer: Self-pay

## 2019-01-26 DIAGNOSIS — Z7982 Long term (current) use of aspirin: Secondary | ICD-10-CM | POA: Insufficient documentation

## 2019-01-26 DIAGNOSIS — Z794 Long term (current) use of insulin: Secondary | ICD-10-CM | POA: Insufficient documentation

## 2019-01-26 DIAGNOSIS — J45909 Unspecified asthma, uncomplicated: Secondary | ICD-10-CM | POA: Insufficient documentation

## 2019-01-26 DIAGNOSIS — F17210 Nicotine dependence, cigarettes, uncomplicated: Secondary | ICD-10-CM | POA: Insufficient documentation

## 2019-01-26 DIAGNOSIS — H5789 Other specified disorders of eye and adnexa: Secondary | ICD-10-CM | POA: Insufficient documentation

## 2019-01-26 DIAGNOSIS — I10 Essential (primary) hypertension: Secondary | ICD-10-CM | POA: Insufficient documentation

## 2019-01-26 DIAGNOSIS — H53142 Visual discomfort, left eye: Secondary | ICD-10-CM | POA: Insufficient documentation

## 2019-01-26 DIAGNOSIS — Z79899 Other long term (current) drug therapy: Secondary | ICD-10-CM | POA: Insufficient documentation

## 2019-01-26 DIAGNOSIS — H5712 Ocular pain, left eye: Secondary | ICD-10-CM | POA: Insufficient documentation

## 2019-01-26 MED ORDER — CYCLOPENTOLATE HCL 1 % OP SOLN
1.0000 [drp] | Freq: Once | OPHTHALMIC | Status: AC
  Administered 2019-01-26: 1 [drp] via OPHTHALMIC
  Filled 2019-01-26: qty 2

## 2019-01-26 MED ORDER — FLUORESCEIN SODIUM 1 MG OP STRP
1.0000 | ORAL_STRIP | Freq: Once | OPHTHALMIC | Status: AC
  Administered 2019-01-26: 14:00:00 1 via OPHTHALMIC
  Filled 2019-01-26: qty 1

## 2019-01-26 MED ORDER — LOTEPREDNOL ETABONATE 0.5 % OP SUSP
1.0000 [drp] | Freq: Two times a day (BID) | OPHTHALMIC | Status: DC
  Administered 2019-01-26: 16:00:00 1 [drp] via OPHTHALMIC
  Filled 2019-01-26: qty 5

## 2019-01-26 MED ORDER — TETRACAINE HCL 0.5 % OP SOLN
1.0000 [drp] | Freq: Once | OPHTHALMIC | Status: AC
  Administered 2019-01-26: 14:00:00 1 [drp] via OPHTHALMIC
  Filled 2019-01-26: qty 4

## 2019-01-26 NOTE — ED Provider Notes (Signed)
[facility] EMERGENCY DEPARTMENT Provider Note   CSN: [id number] Arrival date & time: 01/26/19  1117     History   Chief Complaint Chief Complaint  Patient presents with  . Eye Pain    HPI Mercedes Valdez is a 23 y.o. female.  With a past medical history of ADHD, diabetes type 2, elevated blood pressure, PCOS who presents the emergency department chief complaint of left I pain.  Patient states that she awoke yesterday morning with pain in the left eye.  She has had increasing pain and photophobia in her left eye.  She states that she has had no known injuries to the eye.  She does wear glasses but has not worn any contact lenses in over a year.  She denies lash mattering or crusting.  She does not have a history of eye allergies.     HPI  Past Medical History:  Diagnosis Date  . ADHD (attention deficit hyperactivity disorder)   . Asthma   . Diabetes mellitus    Type 2  . Elevated blood pressure   . Obesity   . PCOS (polycystic ovarian syndrome)     Patient Active Problem List   Diagnosis Date Noted  . Psychosocial stressors 09/26/2014  . Insulin dependent type 2 diabetes mellitus ([place]) 09/06/2014  . Bacterial vaginosis 09/05/2014  . STI (sexually transmitted infection) 09/05/2014  . Multiple drug allergies 02/27/2014  . Musculoskeletal pain 04/08/2013  . Unspecified gastritis and gastroduodenitis without mention of hemorrhage 04/08/2013  . Type 2 diabetes mellitus ([place]) 04/08/2013  . Irregular menses 04/08/2013  . Dysfunctional uterine bleeding 11/09/2012  . PCOS (polycystic ovarian syndrome) 09/29/2012  . Asthma     Past Surgical History:  Procedure Laterality Date  . EXTERNAL EAR SURGERY     ear tubes     OB History   No obstetric history on file.      Home Medications    Prior to Admission medications   Medication Sig Start Date End Date Taking? Authorizing Provider  aspirin EC 81 MG tablet Take 1 tablet (81 mg total) by mouth  daily. 09/20/18   Scot Jun, FNP  atorvastatin (LIPITOR) 20 MG tablet Take 1 tablet (20 mg total) by mouth daily. To lower cholesterol 12/13/18   Fulp, Cammie, MD  blood glucose meter kit and supplies KIT Dispense based on patient and insurance preference. Use up to four times daily as directed. (FOR ICD-10 E.11.9) 09/20/18   Scot Jun, FNP  Dulaglutide (TRULICITY) 7.51 WC/5.8NI SOPN Inject 0.75 mg into the skin once a week. 01/19/19   Charlott Rakes, MD  Insulin Glargine (LANTUS SOLOSTAR) 100 UNIT/ML Solostar Pen Inject 30 Units into the skin daily. 01/19/19   Fulp, Cammie, MD  Insulin Pen Needle (PEN NEEDLES) 30G X 8 MM MISC 1 pen by Does not apply route daily. 09/20/18   Scot Jun, FNP    Family History Family History  Problem Relation Age of Onset  . Diabetes Mother   . Vision loss Mother   . ADD / ADHD Brother   . Allergies Brother   . Asthma Brother   . Vision loss Brother   . Cancer Paternal Grandfather        Colon Cancer  . Heart disease Father 15       died from MI at age 24    Social History Social History   Tobacco Use  . Smoking status: Current Every Day Smoker    Packs/day: 0.20  Types: Cigarettes  . Smokeless tobacco: Never Used  Substance Use Topics  . Alcohol use: Yes  . Drug use: Yes    Types: Marijuana     Allergies   Amoxicillin, Augmentin [amoxicillin-pot clavulanate], Penicillins, and Suprax [cefixime]   Review of Systems Review of Systems Ten systems reviewed and are negative for acute change, except as noted in the HPI.    Physical Exam Updated Vital Signs BP 139/89 (BP Location: Left Arm)   Pulse 100   Temp 98.3 F (36.8 C) (Oral)   Resp 16   Ht _0  (1.626 m)   Wt 102.1 kg   SpO2 99%   BMI 38.62 kg/m   Physical Exam Vitals signs and nursing note reviewed.  Constitutional:      General: She is not in acute distress.    Appearance: She is well-developed. She is not diaphoretic.  HENT:     Head:  Normocephalic and atraumatic.  Eyes:     General: Lids are normal. Vision grossly intact. Gaze aligned appropriately. No scleral icterus.       Right eye: No foreign body.        Left eye: Discharge present.No foreign body.     Intraocular pressure: Right eye pressure is 23 mmHg. Left eye pressure is 33 mmHg. Measurements were taken using a handheld tonometer.    Extraocular Movements: Extraocular movements intact.     Right eye: Normal extraocular motion.     Left eye: Normal extraocular motion.     Conjunctiva/sclera:     Right eye: Right conjunctiva is not injected. No chemosis or exudate.    Left eye: Left conjunctiva is injected. No chemosis or exudate.    Pupils:     Right eye: No corneal abrasion or fluorescein uptake.     Left eye: No corneal abrasion or fluorescein uptake.     Slit lamp exam:    Left eye: Photophobia present.     Comments: Difficult examination secondary to severe photophobia, no consensual photophobia.  Intraocular pressure noted to be elevated on the left.  Neck:     Musculoskeletal: Normal range of motion.  Cardiovascular:     Rate and Rhythm: Normal rate and regular rhythm.     Heart sounds: Normal heart sounds. No murmur. No friction rub. No gallop.   Pulmonary:     Effort: Pulmonary effort is normal. No respiratory distress.     Breath sounds: Normal breath sounds.  Abdominal:     General: Bowel sounds are normal. There is no distension.     Palpations: Abdomen is soft. There is no mass.     Tenderness: There is no abdominal tenderness. There is no guarding.  Skin:    General: Skin is warm and dry.  Neurological:     Mental Status: She is alert and oriented to person, place, and time.  Psychiatric:        Behavior: Behavior normal.      ED Treatments / Results  Labs (all labs ordered are listed, but only abnormal results are displayed) Labs Reviewed - No data to display  EKG None  Radiology No results found.  Procedures Procedures  (including critical care time)  Medications Ordered in ED Medications  fluorescein ophthalmic strip 1 strip (has no administration in time range)  tetracaine (PONTOCAINE) 0.5 % ophthalmic solution 1 drop (has no administration in time range)     Initial Impression / Assessment and Plan / ED Course  I have reviewed the triage vital  signs and the nursing notes.  Pertinent labs & imaging results that were available during my care of the patient were reviewed by me and considered in my medical decision making (see chart for details).        Patient with creased IOP of the left eye.  I consult with Dr. Lattie Haw sign.  She and I both have suspicion that this may be a false reading.  I was unable to return get second readings as the patient had difficulty tolerating the examination.  Dr. Nancy Fetter recommends low-dose steroids for the eye along with cyclodryl for eye dilation.  Patient is to start the steroid twice daily.  Cyclogyl twice a day and then follow closely with Dr. Nancy Fetter on Friday morning at 9 AM.  Discussed return precautions.  No evidence of corneal abrasion.  I do not feel she has acute angle-closure glaucoma.  Patient is not having any severe headache nausea or vomiting.  Final Clinical Impressions(s) / ED Diagnoses   Final diagnoses:  None    ED Discharge Orders    None       Margarita Mail, PA-C 01/26/19 2244    Little, Wenda Overland, MD 01/28/19 1730

## 2019-01-26 NOTE — ED Triage Notes (Addendum)
Pt's Lt eye has been hurting since yesterday rated 8/10, she denies HA, dizziness or nausea. She does endorse being light sensitive & the sight on that eye being blurry.

## 2019-01-26 NOTE — Discharge Instructions (Signed)
Cyclopentolate-Apply 2 drops to the left eye  2 x daily of the cycplopentolate. Make sure to apply pressure the the tear duct near your nose (the middle side of the eye) during that time. Lotepredrenol drops -Apply one drop to the left eye 2 x daily

## 2019-02-10 ENCOUNTER — Telehealth: Payer: Self-pay

## 2019-02-10 NOTE — Telephone Encounter (Signed)
Called patient to do their pre-visit COVID screening.  Call went to a voicemail that is not set up. Unable to do prescreening or leave voicemail. 

## 2019-02-14 ENCOUNTER — Ambulatory Visit: Payer: Self-pay

## 2019-03-08 MED FILL — TRULICITY 0.75 MG/0.5 ML PE: 0.75 | 28 days supply | Qty: 2 | Fill #1

## 2019-05-03 ENCOUNTER — Encounter (HOSPITAL_COMMUNITY): Payer: Self-pay | Admitting: Emergency Medicine

## 2019-05-03 ENCOUNTER — Emergency Department (HOSPITAL_COMMUNITY)
Admission: EM | Admit: 2019-05-03 | Discharge: 2019-05-03 | Disposition: A | Discharge status: Home / Self Care | Payer: Self-pay | Attending: Emergency Medicine | Admitting: Emergency Medicine

## 2019-05-03 ENCOUNTER — Other Ambulatory Visit: Payer: Self-pay

## 2019-05-03 DIAGNOSIS — Z794 Long term (current) use of insulin: Secondary | ICD-10-CM | POA: Insufficient documentation

## 2019-05-03 DIAGNOSIS — R739 Hyperglycemia, unspecified: Secondary | ICD-10-CM

## 2019-05-03 DIAGNOSIS — F17210 Nicotine dependence, cigarettes, uncomplicated: Secondary | ICD-10-CM | POA: Insufficient documentation

## 2019-05-03 DIAGNOSIS — N390 Urinary tract infection, site not specified: Secondary | ICD-10-CM | POA: Insufficient documentation

## 2019-05-03 DIAGNOSIS — Z79899 Other long term (current) drug therapy: Secondary | ICD-10-CM | POA: Insufficient documentation

## 2019-05-03 DIAGNOSIS — E1165 Type 2 diabetes mellitus with hyperglycemia: Secondary | ICD-10-CM | POA: Insufficient documentation

## 2019-05-03 DIAGNOSIS — Z7982 Long term (current) use of aspirin: Secondary | ICD-10-CM | POA: Insufficient documentation

## 2019-05-03 DIAGNOSIS — J45909 Unspecified asthma, uncomplicated: Secondary | ICD-10-CM | POA: Insufficient documentation

## 2019-05-03 LAB — WET PREP, GENITAL
Clue Cells Wet Prep HPF POC: NONE SEEN
Sperm: NONE SEEN
Trich, Wet Prep: NONE SEEN
Yeast Wet Prep HPF POC: NONE SEEN

## 2019-05-03 LAB — URINALYSIS, ROUTINE W REFLEX MICROSCOPIC
Bilirubin Urine: NEGATIVE
Glucose, UA: >=500 mg/dL — AB
Ketones, ur: 20 mg/dL — AB
Nitrite: POSITIVE — AB
Protein, ur: 100 mg/dL — AB
Specific Gravity, Urine: 1.033 — ABNORMAL HIGH (ref 1.005–1.030)
WBC, UA: >50 WBC/hpf — ABNORMAL HIGH (ref 0–5)
pH: 5 (ref 5.0–8.0)

## 2019-05-03 LAB — PREGNANCY, URINE: Preg Test, Ur: NEGATIVE

## 2019-05-03 LAB — CBG MONITORING, ED: Glucose-Capillary: 434 mg/dL — ABNORMAL HIGH (ref 70–99)

## 2019-05-03 MED ORDER — SULFAMETHOXAZOLE-TRIMETHOPRIM 800-160 MG PO TABS: 1.0000 | ORAL_TABLET | Freq: Two times a day (BID) | ORAL | 0 refills | Status: AC

## 2019-05-03 MED ORDER — SODIUM CHLORIDE 0.9 % IV BOLUS: 1000.0000 mL | Freq: Once | INTRAVENOUS | Status: DC

## 2019-05-03 NOTE — ED Provider Notes (Signed)
[facility] EMERGENCY DEPARTMENT Provider Note   CSN: [id number] Arrival date & time: 05/03/19  1214     History   Chief Complaint Chief Complaint  Patient presents with   Dysuria    HPI Mercedes Valdez is a 23 y.o. female with history of ADHD, asthma, type 2 diabetes mellitus, obesity, PCOS presents for evaluation of acute onset, persistent urinary symptoms for 3 to 4 days.  Reports dysuria, urgency, frequency, suprapubic pressure.  Denies hematuria, diarrhea, constipation, fevers, flank pain, nausea, or vomiting.  No abnormal vaginal discharge or bleeding but she does note some vaginal itching that began a few days ago as well.  She states that she recently started a course of clindamycin for a left axilla abscess which she states she is prone to and has prescriptions at home for when these flare up.  Reports the axilla abscess has improved significantly since she started the antibiotic.  Notes that she is prone to yeast infections when she takes antibiotics.  States that she has no concern for HIV or syphilis and reports low concern for STDs in general.  Also of note, the patient has been out of her diabetes medications for the last several months after missing her follow-up appointment.  She has not been checking her blood sugars at home.  Her last hemoglobin A1c in June 2020 was 13.7%     The history is provided by the patient.    Past Medical History:  Diagnosis Date   ADHD (attention deficit hyperactivity disorder)    Asthma    Diabetes mellitus    Type 2   Elevated blood pressure    Obesity    PCOS (polycystic ovarian syndrome)     Patient Active Problem List   Diagnosis Date Noted   Psychosocial stressors 09/26/2014   Insulin dependent type 2 diabetes mellitus ([place]) 09/06/2014   Bacterial vaginosis 09/05/2014   STI (sexually transmitted infection) 09/05/2014   Multiple drug allergies 02/27/2014   Musculoskeletal pain 04/08/2013    Unspecified gastritis and gastroduodenitis without mention of hemorrhage 04/08/2013   Type 2 diabetes mellitus ([place]) 04/08/2013   Irregular menses 04/08/2013   Dysfunctional uterine bleeding 11/09/2012   PCOS (polycystic ovarian syndrome) 09/29/2012   Asthma     Past Surgical History:  Procedure Laterality Date   EXTERNAL EAR SURGERY     ear tubes     OB History   No obstetric history on file.      Home Medications    Prior to Admission medications   Medication Sig Start Date End Date Taking? Authorizing Provider  aspirin EC 81 MG tablet Take 1 tablet (81 mg total) by mouth daily. 09/20/18   Scot Jun, FNP  atorvastatin (LIPITOR) 20 MG tablet Take 1 tablet (20 mg total) by mouth daily. To lower cholesterol 12/13/18   Fulp, Cammie, MD  blood glucose meter kit and supplies KIT Dispense based on patient and insurance preference. Use up to four times daily as directed. (FOR ICD-10 E.11.9) 09/20/18   Scot Jun, FNP  Dulaglutide (TRULICITY) 3.55 DD/2.2GU SOPN Inject 0.75 mg into the skin once a week. 01/19/19   Charlott Rakes, MD  Insulin Glargine (LANTUS SOLOSTAR) 100 UNIT/ML Solostar Pen Inject 30 Units into the skin daily. 01/19/19   Fulp, Cammie, MD  Insulin Pen Needle (PEN NEEDLES) 30G X 8 MM MISC 1 pen by Does not apply route daily. 09/20/18   Scot Jun, FNP  sulfamethoxazole-trimethoprim (BACTRIM DS) 800-160 MG tablet  Take 1 tablet by mouth 2 (two) times daily for 7 days. 05/03/19 05/10/19  Renita Papa, PA-C    Family History Family History  Problem Relation Age of Onset   Diabetes Mother    Vision loss Mother    ADD / ADHD Brother    Allergies Brother    Asthma Brother    Vision loss Brother    Cancer Paternal Grandfather        Colon Cancer   Heart disease Father 96       died from MI at age 53    Social History Social History   Tobacco Use   Smoking status: Current Every Day Smoker    Packs/day: 0.20    Types: Cigarettes     Smokeless tobacco: Never Used  Substance Use Topics   Alcohol use: Yes   Drug use: Yes    Types: Marijuana     Allergies   Amoxicillin, Augmentin [amoxicillin-pot clavulanate], Penicillins, and Suprax [cefixime]   Review of Systems Review of Systems  Constitutional: Negative for chills and fever.  Respiratory: Negative for shortness of breath.   Cardiovascular: Negative for chest pain.  Gastrointestinal: Positive for abdominal pain. Negative for constipation, diarrhea, nausea and vomiting.  Genitourinary: Positive for dysuria, frequency and urgency. Negative for hematuria, vaginal bleeding, vaginal discharge and vaginal pain.  All other systems reviewed and are negative.    Physical Exam Updated Vital Signs BP (!) 153/95 (BP Location: Right Arm)    Pulse 100    Temp 99.1 F (37.3 C) (Oral)    Resp 14    Ht '5\' 5"'$  (1.651 m)    Wt 99.8 kg    LMP 04/04/2019    SpO2 100%    BMI 36.61 kg/m   Physical Exam Vitals signs and nursing note reviewed.  Constitutional:      General: She is not in acute distress.    Appearance: She is well-developed.     Comments: Resting comfortably in chair  HENT:     Head: Normocephalic and atraumatic.  Eyes:     General:        Right eye: No discharge.        Left eye: No discharge.     Conjunctiva/sclera: Conjunctivae normal.  Neck:     Musculoskeletal: Neck supple.     Vascular: No JVD.     Trachea: No tracheal deviation.  Cardiovascular:     Rate and Rhythm: Normal rate and regular rhythm.  Pulmonary:     Effort: Pulmonary effort is normal.     Breath sounds: Normal breath sounds.  Abdominal:     General: Abdomen is flat. Bowel sounds are normal. There is no distension.     Palpations: Abdomen is soft.     Tenderness: There is no abdominal tenderness. There is no left CVA tenderness, guarding or rebound.  Genitourinary:    Comments: Examination performed in the presence of a chaperone.  No masses or lesions to the external  genitalia.  Moderate amount of thick white discharge in the vaginal vault.  No cervical motion tenderness or adnexal tenderness. Skin:    General: Skin is warm and dry.     Findings: No erythema.  Neurological:     Mental Status: She is alert.  Psychiatric:        Behavior: Behavior normal.      ED Treatments / Results  Labs (all labs ordered are listed, but only abnormal results are displayed) Labs Reviewed  WET PREP,  GENITAL - Abnormal; Notable for the following components:      Result Value   WBC, Wet Prep HPF POC MANY (*)    All other components within normal limits  URINALYSIS, ROUTINE W REFLEX MICROSCOPIC - Abnormal; Notable for the following components:   APPearance CLOUDY (*)    Specific Gravity, Urine 1.033 (*)    Glucose, UA >=500 (*)    Hgb urine dipstick MODERATE (*)    Ketones, ur 20 (*)    Protein, ur 100 (*)    Nitrite POSITIVE (*)    Leukocytes,Ua LARGE (*)    WBC, UA >50 (*)    Bacteria, UA RARE (*)    All other components within normal limits  CBG MONITORING, ED - Abnormal; Notable for the following components:   Glucose-Capillary 434 (*)    All other components within normal limits  URINE CULTURE  PREGNANCY, URINE  GC/CHLAMYDIA PROBE AMP ([place]) NOT AT [facility]    EKG None  Radiology No results found.  Procedures Procedures (including critical care time)  Medications Ordered in ED Medications - No data to display   Initial Impression / Assessment and Plan / ED Course  I have reviewed the triage vital signs and the nursing notes.  Pertinent labs & imaging results that were available during my care of the patient were reviewed by me and considered in my medical decision making (see chart for details).        Patient presenting for evaluation of urinary symptoms, suprapubic discomfort.  She is afebrile, mildly hypertensive.  Vital signs otherwise stable.  She is nontoxic in appearance, resting very comfortably in chair.  Abdomen is soft  and nontender with no peritoneal signs.  No CVA tenderness.  Her UA is concerning for UTI.  She has glucosuria and some ketones in urine which is not unusual for her.  Pelvic examination is not concerning for PID, TOA, or ovarian torsion, or ectopic pregnancy.  GC chlamydia cultures obtained but she declines HIV or syphilis testing.  A CBG was checked which was markedly elevated.  Plan to check CBC, BMP, VBG and give IV fluids to help improve blood sugars and rule out DKA however the patient declined this.  We had a long discussion regarding the importance of getting her blood sugars under control and that DKA is a very serious illness.  She is overall quite well-appearing, resting comfortably tolerating p.o. fluids in the ED.  I have low suspicion of acute surgical abdominal pathology.  However in the setting of infection and poorly controlled blood sugars I emphasized the importance  of close follow-up with her PCP to restart her medications and return to the ED if she has any worsening signs or symptoms.  We will switch her from clindamycin to Bactrim for treatment of both skin infection  (though this is significantly improved per the patient's report) and UTI.  Patient verbalized understanding of and agreement with plan and patient stable for discharge home at this time.  Discussed with Dr. Roslynn Amble who agrees with assessment and plan at this time.  Final Clinical Impressions(s) / ED Diagnoses   Final diagnoses:  Lower urinary tract infectious disease  Hyperglycemia    ED Discharge Orders         Ordered    sulfamethoxazole-trimethoprim (BACTRIM DS) 800-160 MG tablet  2 times daily     05/03/19 1516           Rodell Perna A, PA-C 05/03/19 1519    Dykstra,  Ellwood Dense, MD 05/04/19 [phone]

## 2019-05-03 NOTE — ED Notes (Signed)
Patient verbalizes understanding of discharge instructions. Opportunity for questioning and answers were provided. Armband removed by staff, pt discharged from ED.  

## 2019-05-03 NOTE — ED Triage Notes (Signed)
Pt states she started having painful urination 3-4 days ago and lower abd cramping. Denies any blood in her urine.

## 2019-05-03 NOTE — Discharge Instructions (Addendum)
Stop taking clindamycin, start taking Bactrim.  Please take all of your antibiotics until finished!   Take your antibiotics with food.  Common side effects of antibiotics include nausea, vomiting, abdominal discomfort, and diarrhea. You may help offset some of this with probiotics which you can buy or get in yogurt. Do not eat  or take the probiotics until 2 hours after your antibiotic.    Please follow-up with your primary care provider for reevaluation of your high blood sugars and to restart your medications.  Return to the emergency department immediately for any concerning signs or symptoms develop such as fevers, worsening pain, persistent vomiting, loss of consciousness.  Some studies suggest that certain antibiotics can reduce the efficacy of certain oral contraceptive pills (birth control), so please use additional contraceptives (condoms or other barrier method) while you are taking the antibiotics and for an additional 5 to 7 days afterwards if you are a female on these medications.

## 2019-05-03 NOTE — ED Notes (Signed)
Pelvic cart at bedside. 

## 2019-05-04 LAB — GC/CHLAMYDIA PROBE AMP (~~LOC~~) NOT AT ARMC
Chlamydia: NEGATIVE
Neisseria Gonorrhea: NEGATIVE

## 2019-05-05 LAB — URINE CULTURE: Culture: 80000 — AB

## 2019-05-06 ENCOUNTER — Telehealth: Payer: Self-pay | Admitting: Emergency Medicine

## 2019-05-06 NOTE — Telephone Encounter (Signed)
Post ED Visit - Positive Culture Follow-up  Culture report reviewed by antimicrobial stewardship pharmacist: [place] Team []  Elenor Quinones, Pharm.D. []  Heide Guile, Pharm.D., BCPS AQ-ID []  Parks Neptune, Pharm.D., BCPS []  Alycia Rossetti, Pharm.D., BCPS []  [place], Pharm.D., BCPS, AAHIVP []  Legrand Como, Pharm.D., BCPS, AAHIVP []  Salome Arnt, PharmD, BCPS []  Johnnette Gourd, PharmD, BCPS []  Hughes Better, PharmD, BCPS [x]  Elicia Lamp, PharmD []  Laqueta Linden, PharmD, BCPS []  Albertina Parr, PharmD  [place] Team []  Leodis Sias, PharmD []  Lindell Spar, PharmD []  Royetta Asal, PharmD []  Graylin Shiver, Rph []  Rema Fendt) Glennon Mac, PharmD []  Arlyn Dunning, PharmD []  Netta Cedars, PharmD []  Dia Sitter, PharmD []  Leone Haven, PharmD []  Gretta Arab, PharmD []  Theodis Shove, PharmD []  Peggyann Juba, PharmD []  Reuel Boom, PharmD   Positive urine culture Treated with Sulfamethoxazole-Trimethoprim, organism sensitive to the same and no further patient follow-up is required at this time.  Sandi Raveling Akshita Italiano 05/06/2019, 1:19 PM

## 2019-08-29 ENCOUNTER — Other Ambulatory Visit: Payer: Self-pay

## 2019-08-29 ENCOUNTER — Emergency Department (HOSPITAL_COMMUNITY)
Admission: EM | Admit: 2019-08-29 | Discharge: 2019-08-29 | Disposition: A | Discharge status: Home / Self Care | Payer: Self-pay | Attending: Emergency Medicine | Admitting: Emergency Medicine

## 2019-08-29 ENCOUNTER — Encounter (HOSPITAL_COMMUNITY): Payer: Self-pay | Admitting: *Deleted

## 2019-08-29 DIAGNOSIS — F909 Attention-deficit hyperactivity disorder, unspecified type: Secondary | ICD-10-CM | POA: Insufficient documentation

## 2019-08-29 DIAGNOSIS — A6000 Herpesviral infection of urogenital system, unspecified: Secondary | ICD-10-CM | POA: Insufficient documentation

## 2019-08-29 DIAGNOSIS — E119 Type 2 diabetes mellitus without complications: Secondary | ICD-10-CM | POA: Insufficient documentation

## 2019-08-29 DIAGNOSIS — Z79899 Other long term (current) drug therapy: Secondary | ICD-10-CM | POA: Insufficient documentation

## 2019-08-29 DIAGNOSIS — Z7982 Long term (current) use of aspirin: Secondary | ICD-10-CM | POA: Insufficient documentation

## 2019-08-29 DIAGNOSIS — J45909 Unspecified asthma, uncomplicated: Secondary | ICD-10-CM | POA: Insufficient documentation

## 2019-08-29 DIAGNOSIS — Z794 Long term (current) use of insulin: Secondary | ICD-10-CM | POA: Insufficient documentation

## 2019-08-29 DIAGNOSIS — F17210 Nicotine dependence, cigarettes, uncomplicated: Secondary | ICD-10-CM | POA: Insufficient documentation

## 2019-08-29 DIAGNOSIS — N760 Acute vaginitis: Secondary | ICD-10-CM | POA: Insufficient documentation

## 2019-08-29 DIAGNOSIS — B9689 Other specified bacterial agents as the cause of diseases classified elsewhere: Secondary | ICD-10-CM

## 2019-08-29 LAB — HIV ANTIBODY (ROUTINE TESTING W REFLEX): HIV Screen 4th Generation wRfx: NONREACTIVE

## 2019-08-29 LAB — URINALYSIS, ROUTINE W REFLEX MICROSCOPIC
Bilirubin Urine: NEGATIVE
Glucose, UA: >=500 mg/dL — AB
Hgb urine dipstick: NEGATIVE
Ketones, ur: 5 mg/dL — AB
Nitrite: NEGATIVE
Protein, ur: 30 mg/dL — AB
Specific Gravity, Urine: >1.046 — ABNORMAL HIGH (ref 1.005–1.030)
pH: 5 (ref 5.0–8.0)

## 2019-08-29 LAB — I-STAT BETA HCG BLOOD, ED (MC, WL, AP ONLY): I-stat hCG, quantitative: <5 m[IU]/mL (ref <5)

## 2019-08-29 LAB — WET PREP, GENITAL
Sperm: NONE SEEN
Trich, Wet Prep: NONE SEEN
Yeast Wet Prep HPF POC: NONE SEEN

## 2019-08-29 LAB — RPR: RPR Ser Ql: NONREACTIVE

## 2019-08-29 MED ORDER — VALACYCLOVIR HCL 500 MG PO TABS: 500.0000 mg | ORAL_TABLET | Freq: Two times a day (BID) | ORAL | 0 refills | Status: AC

## 2019-08-29 MED ORDER — SODIUM CHLORIDE 0.9% FLUSH: 3.0000 mL | Freq: Once | INTRAVENOUS | Status: DC

## 2019-08-29 MED ORDER — METRONIDAZOLE 500 MG PO TABS: 500.0000 mg | ORAL_TABLET | Freq: Two times a day (BID) | ORAL | 0 refills | Status: DC

## 2019-08-29 NOTE — ED Provider Notes (Signed)
[facility] EMERGENCY DEPARTMENT Provider Note   CSN: [id number] Arrival date & time: 08/29/19  0342     History Chief Complaint  Patient presents with  . Vaginal Pain    Mercedes Valdez is a 24 y.o. female who presents with vaginal pain. She states symptoms started 2 days ago. She reports sores in the vaginal area which hurt when she wipes. She denies dysuria or pelvic pain. She also is having some vaginal discharge and thinks she may have a yeast infection. She is sexually active with the same partner. She believes she is having a herpes flare because she has had similar symptoms about 6 years ago and was diagnosed with this.  HPI     Past Medical History:  Diagnosis Date  . ADHD (attention deficit hyperactivity disorder)   . Asthma   . Diabetes mellitus    Type 2  . Elevated blood pressure   . Obesity   . PCOS (polycystic ovarian syndrome)     Patient Active Problem List   Diagnosis Date Noted  . Psychosocial stressors 09/26/2014  . Insulin dependent type 2 diabetes mellitus ([place]) 09/06/2014  . Bacterial vaginosis 09/05/2014  . STI (sexually transmitted infection) 09/05/2014  . Multiple drug allergies 02/27/2014  . Musculoskeletal pain 04/08/2013  . Unspecified gastritis and gastroduodenitis without mention of hemorrhage 04/08/2013  . Type 2 diabetes mellitus ([place]) 04/08/2013  . Irregular menses 04/08/2013  . Dysfunctional uterine bleeding 11/09/2012  . PCOS (polycystic ovarian syndrome) 09/29/2012  . Asthma     Past Surgical History:  Procedure Laterality Date  . EXTERNAL EAR SURGERY     ear tubes     OB History   No obstetric history on file.     Family History  Problem Relation Age of Onset  . Diabetes Mother   . Vision loss Mother   . ADD / ADHD Brother   . Allergies Brother   . Asthma Brother   . Vision loss Brother   . Cancer Paternal Grandfather        Colon Cancer  . Heart disease Father 58       died from MI at age 12     Social History   Tobacco Use  . Smoking status: Current Every Day Smoker    Packs/day: 0.20    Types: Cigarettes  . Smokeless tobacco: Never Used  Substance Use Topics  . Alcohol use: Yes  . Drug use: Yes    Types: Marijuana    Home Medications Prior to Admission medications   Medication Sig Start Date End Date Taking? Authorizing Provider  aspirin EC 81 MG tablet Take 1 tablet (81 mg total) by mouth daily. 09/20/18   Scot Jun, FNP  atorvastatin (LIPITOR) 20 MG tablet Take 1 tablet (20 mg total) by mouth daily. To lower cholesterol 12/13/18   Fulp, Cammie, MD  blood glucose meter kit and supplies KIT Dispense based on patient and insurance preference. Use up to four times daily as directed. (FOR ICD-10 E.11.9) 09/20/18   Scot Jun, FNP  Dulaglutide (TRULICITY) 4.31 VQ/0.0QQ SOPN Inject 0.75 mg into the skin once a week. 01/19/19   Charlott Rakes, MD  Insulin Glargine (LANTUS SOLOSTAR) 100 UNIT/ML Solostar Pen Inject 30 Units into the skin daily. 01/19/19   Fulp, Cammie, MD  Insulin Pen Needle (PEN NEEDLES) 30G X 8 MM MISC 1 pen by Does not apply route daily. 09/20/18   Scot Jun, FNP    Allergies  Amoxicillin, Augmentin [amoxicillin-pot clavulanate], Penicillins, and Suprax [cefixime]  Review of Systems   Review of Systems  Constitutional: Negative for fever.  Genitourinary: Positive for genital sores, vaginal discharge and vaginal pain. Negative for dysuria and pelvic pain.  All other systems reviewed and are negative.   Physical Exam Updated Vital Signs BP 126/89 (BP Location: Left Arm)   Pulse 100   Temp 98.2 F (36.8 C) (Oral)   Resp 18   Ht 5' 5"  (1.651 m)   Wt 99.8 kg   LMP 07/29/2019   SpO2 96%   BMI 36.61 kg/m   Physical Exam Vitals and nursing note reviewed.  Constitutional:      General: She is not in acute distress.    Appearance: She is well-developed. She is obese. She is not ill-appearing.  HENT:     Head:  Normocephalic and atraumatic.  Eyes:     General: No scleral icterus.       Right eye: No discharge.        Left eye: No discharge.     Conjunctiva/sclera: Conjunctivae normal.     Pupils: Pupils are equal, round, and reactive to light.  Cardiovascular:     Rate and Rhythm: Normal rate.  Pulmonary:     Effort: Pulmonary effort is normal. No respiratory distress.  Abdominal:     General: There is no distension.  Genitourinary:    Comments: Pelvic: No inguinal lymphadenopathy or inguinal hernia noted. Normal external genitalia. No pain with speculum insertion. Closed cervical os with normal appearance - no rash or lesions. Copious thick white discharge in vagina. On bimanual examination no adnexal tenderness or cervical motion tenderness. Chaperone Tonia Ghent, NT) present during exam.  Musculoskeletal:     Cervical back: Normal range of motion.  Skin:    General: Skin is warm and dry.  Neurological:     Mental Status: She is alert and oriented to person, place, and time.  Psychiatric:        Behavior: Behavior normal.     ED Results / Procedures / Treatments   Labs (all labs ordered are listed, but only abnormal results are displayed) Labs Reviewed  WET PREP, GENITAL - Abnormal; Notable for the following components:      Result Value   Clue Cells Wet Prep HPF POC PRESENT (*)    WBC, Wet Prep HPF POC MANY (*)    All other components within normal limits  URINALYSIS, ROUTINE W REFLEX MICROSCOPIC  RPR  HIV ANTIBODY (ROUTINE TESTING W REFLEX)  I-STAT BETA HCG BLOOD, ED ([facility], [facility], [facility] ONLY)  GC/CHLAMYDIA PROBE AMP ([place]) NOT AT [facility]    EKG None  Radiology No results found.  Procedures Procedures (including critical care time)  Medications Ordered in ED Medications  sodium chloride flush (NS) 0.9 % injection 3 mL (has no administration in time range)    ED Course  I have reviewed the triage vital signs and the nursing notes.  Pertinent labs & imaging results that were  available during my care of the patient were reviewed by me and considered in my medical decision making (see chart for details).  24 year old female presents with vaginal pain, sores, vaginal discharge for the past several days.  Pelvic exam reveals thick white discharge without any obvious source.  Her vital signs are normal.  Abdomen is soft and nontender.  She has no adnexal or cervical motion tenderness.  Wet prep shows many clue cells and no yeast.  Per chart review patient  has tested positive for herpes in the past.  She feels that symptoms are consistent with her prior herpes flare.  Will cover her empirically give her prescription for Valtrex and treat for BV with Flagyl.   MDM Rules/Calculators/A&P                       Final Clinical Impression(s) / ED Diagnoses Final diagnoses:  BV (bacterial vaginosis)  Genital herpes simplex, unspecified site    Rx / DC Orders ED Discharge Orders    None       Recardo Evangelist, PA-C 08/29/19 [id number]    Noemi Chapel, MD 08/30/19 [phone]

## 2019-08-29 NOTE — Discharge Instructions (Signed)
Take Valtrex 500mg  twice a day for three days Take Flagyl twice a day for one week for discharge Please return if you are worsening

## 2019-08-29 NOTE — ED Triage Notes (Signed)
The pt is c/o vaginal pain  For 2 days  Hx of genital herpes and she thinks she has some lesions  lmp last month

## 2019-08-29 NOTE — ED Notes (Signed)
Patient Alert and oriented to baseline. Stable and ambulatory to baseline. Patient verbalized understanding of the discharge instructions.  Patient belongings were taken by the patient.   

## 2019-08-30 LAB — GC/CHLAMYDIA PROBE AMP (~~LOC~~) NOT AT ARMC
Chlamydia: NEGATIVE
Comment: NEGATIVE
Comment: NORMAL
Neisseria Gonorrhea: NEGATIVE

## 2019-09-07 ENCOUNTER — Ambulatory Visit (HOSPITAL_COMMUNITY)
Admission: EM | Admit: 2019-09-07 | Discharge: 2019-09-07 | Disposition: A | Discharge status: Home / Self Care | Payer: Self-pay | Attending: Emergency Medicine | Admitting: Emergency Medicine

## 2019-09-07 ENCOUNTER — Other Ambulatory Visit: Payer: Self-pay

## 2019-09-07 ENCOUNTER — Encounter (HOSPITAL_COMMUNITY): Payer: Self-pay | Admitting: Emergency Medicine

## 2019-09-07 DIAGNOSIS — L02412 Cutaneous abscess of left axilla: Secondary | ICD-10-CM | POA: Insufficient documentation

## 2019-09-07 MED ORDER — IBUPROFEN 600 MG PO TABS: 600.0000 mg | ORAL_TABLET | Freq: Four times a day (QID) | ORAL | 0 refills | Status: DC | PRN

## 2019-09-07 MED ORDER — DOXYCYCLINE HYCLATE 100 MG PO CAPS: 100.0000 mg | ORAL_CAPSULE | Freq: Two times a day (BID) | ORAL | 0 refills | Status: AC

## 2019-09-07 MED ORDER — LIDOCAINE-EPINEPHRINE (PF) 2 %-1:200000 IJ SOLN
INTRAMUSCULAR | Status: AC
  Filled 2019-09-07: qty 20

## 2019-09-07 NOTE — ED Provider Notes (Signed)
HPI  SUBJECTIVE:  Mercedes Valdez is a 24 y.o. female who presents with two painful massses of gradually increasing size in her left axilla starting 4 days ago.  She denies trauma to the area.  She does not shave her axilla prior no body aches, fevers, purulent drainage.  States that she recently finished Flagyl for BV.  No other antibiotics.  She took ibuprofen 600 mg within 4 to 6 hours.  She has been taking ibuprofen 600 mg as needed been using warm compresses.  The ibuprofen helps.  Symptoms are worse with movement of her arm.  She has a past medical history of diabetes, recurrent axillary boils.  States that she does not check her sugars although she has supplies at home and she has not taken her medications in 6 months due to financial reasons.  No history of MRSA, hidradenitis suppurativa.  Mother also currently has an abscess.  LMP: End of March.  Denies the possibility being pregnant.  PMD: [facility].    Past Medical History:  Diagnosis Date  . ADHD (attention deficit hyperactivity disorder)   . Asthma   . Diabetes mellitus    Type 2  . Elevated blood pressure   . Obesity   . PCOS (polycystic ovarian syndrome)     Past Surgical History:  Procedure Laterality Date  . EXTERNAL EAR SURGERY     ear tubes    Family History  Problem Relation Age of Onset  . Diabetes Mother   . Vision loss Mother   . ADD / ADHD Brother   . Allergies Brother   . Asthma Brother   . Vision loss Brother   . Cancer Paternal Grandfather        Colon Cancer  . Heart disease Father 38       died from MI at age 5    Social History   Tobacco Use  . Smoking status: Current Every Day Smoker    Packs/day: 0.20    Types: Cigarettes  . Smokeless tobacco: Never Used  Substance Use Topics  . Alcohol use: Yes  . Drug use: Yes    Types: Marijuana    No current facility-administered medications for this encounter.  Current Outpatient Medications:  .  blood glucose meter kit and  supplies KIT, Dispense based on patient and insurance preference. Use up to four times daily as directed. (FOR ICD-10 E.11.9), Disp: 1 each, Rfl: 0 .  doxycycline (VIBRAMYCIN) 100 MG capsule, Take 1 capsule (100 mg total) by mouth 2 (two) times daily for 5 days., Disp: 10 capsule, Rfl: 0 .  ibuprofen (ADVIL) 600 MG tablet, Take 1 tablet (600 mg total) by mouth every 6 (six) hours as needed., Disp: 30 tablet, Rfl: 0 .  Insulin Pen Needle (PEN NEEDLES) 30G X 8 MM MISC, 1 pen by Does not apply route daily., Disp: 100 each, Rfl: 3  Allergies  Allergen Reactions  . Amoxicillin Hives  . Augmentin [Amoxicillin-Pot Clavulanate] Hives  . Penicillins Hives    Has patient had a PCN reaction causing immediate rash, facial/tongue/throat swelling, SOB or lightheadedness with hypotension: No Has patient had a PCN reaction causing severe rash involving mucus membranes or skin necrosis: No Has patient had a PCN reaction that required hospitalization No Has patient had a PCN reaction occurring within the last 10 years: No If all of the above answers are "NO", then may proceed with Cephalosporin use.  . Shellfish Allergy Swelling and Other (See Comments)  Itching lips  . Suprax [Cefixime]      ROS  As noted in HPI.   Physical Exam  BP 105/69   Pulse 93   Temp 98.5 F (36.9 C) (Oral)   Resp 16   LMP 08/17/2019   SpO2 96%   Constitutional: Well developed, well nourished, no acute distress Eyes:  EOMI, conjunctiva normal bilaterally HENT: Normocephalic, atraumatic,mucus membranes moist Respiratory: Normal inspiratory effort Cardiovascular: Normal rate GI: nondistended skin: 2.5 x 5 cm nontender mass with no central fluctuance left axilla.  5 x 3 cm tender mass with central fluctuance superior to this. Musculoskeletal: no deformities Neurologic: Alert & oriented x 3, no focal neuro deficits Psychiatric: Speech and behavior appropriate   ED Course   Medications - No data to  display  Orders Placed This Encounter  Procedures  . Aerobic Culture (superficial specimen)    Standing Status:   Standing    Number of Occurrences:   1    No results found for this or any previous visit (from the past 24 hour(s)). No results found.  ED Clinical Impression  1. Abscess of left axilla      ED Assessment/Plan  Patient with a left axillary abscess.  Suspect hidradenitis suppurativa.  Will refer her to [place] [facility] if this recurs.  Procedure note: Cleaned area with chlorhexidine and alcohol.  Used 0.5 cc of lidocaine 1% with epinephrine via infiltration for local anesthesia.  Made a single stab incision with an 11 blade.  Express a copious amount of pus.  Probed wound to break up loculations.  Culture obtained.  Then irrigated out with 120 cc of sterile saline.  Packing placed.  Bulky dressing placed.  Patient tolerated procedure well.  Home with Tylenol/ibuprofen, 5 days of doxycycline 100 mg p.o. twice daily.  Return here in 2 days for packing removal.  To the ER if she gets worse.   Offered to check a random CBG here, patient declined.  Advised her to get in touch with the community health and wellness center to get her back on her medications and encouraged her to start checking her sugar at least once a day.  She states that she has adequate supplies for this.  Discussed labs,  MDM, treatment plan, and plan for follow-up with patient. Discussed sn/sx that should prompt return to the ED. patient agrees with plan.   Meds ordered this encounter  Medications  . ibuprofen (ADVIL) 600 MG tablet    Sig: Take 1 tablet (600 mg total) by mouth every 6 (six) hours as needed.    Dispense:  30 tablet    Refill:  0  . doxycycline (VIBRAMYCIN) 100 MG capsule    Sig: Take 1 capsule (100 mg total) by mouth 2 (two) times daily for 5 days.    Dispense:  10 capsule    Refill:  0    *This clinic note was created using [employer]. Therefore, there may be  occasional mistakes despite careful proofreading.   ?    Melynda Ripple, MD 09/07/19 1339

## 2019-09-07 NOTE — Discharge Instructions (Addendum)
Return here or follow up with your doctor in 2 days for a wound check. Give [place] a working phone number so that we contact you if we need to change your antibiotics. Take the medication as written. Take 1 gram of tylenol with the motrin up to 4 times a day as needed for pain and fever. This is an effective combination for pain. Return to the ER if you get worse, have a persistent fever >100.4, or for any concerns.   Please start checking your sugars at home.  Please get in touch with the community health and wellness center to refill your medications.  Below is a list of primary care practices who are taking new patients for you to follow-up with.  [facility] internal medicine clinic Ground Floor - [facility], [place], [place], [place] [address] [phone]  [facility] Primary Care at [facility] [address] [place], [place] [address] [phone]  [facility] Start [place], [place] [address] [phone]  [facility] Medicine [phone] [place] [address]  [facility]: [place]  [phone]  [facility]: [place]   [phone]  [facility] Family Medicine: [address] [place]  [phone]  [place] [facility] : [address] [place] [phone]  [facility] Primary Care: [address] [place] [phone]  [facility]: [place] [phone]  Dr. Blanchie Serve [place]  [phone]  Dr. Benito Mccreedy, [facility]. [place], [place] [address]  [phone]  Go to www.goodrx.com to look up your medications. This will give you a list of where you can find your prescriptions at the most affordable prices. Or ask the pharmacist what the cash price is, or if they have any other discount programs available to help make your medication more affordable. This can be less expensive than what you would pay with insurance.

## 2019-09-07 NOTE — ED Notes (Signed)
Non-stick dsg with 4x4 gauze and tape dsg applied to abscess area.

## 2019-09-07 NOTE — ED Notes (Signed)
Non compliant diabetic. Has been off of lantus for 6 months due to cost, no longer checks glucose at home.

## 2019-09-07 NOTE — ED Triage Notes (Signed)
Boil under left arm for 4 days. Has had this happen many times in same location.

## 2019-09-10 LAB — AEROBIC CULTURE W GRAM STAIN (SUPERFICIAL SPECIMEN)

## 2019-12-08 ENCOUNTER — Other Ambulatory Visit: Payer: Self-pay

## 2019-12-08 ENCOUNTER — Encounter (HOSPITAL_COMMUNITY): Payer: Self-pay

## 2019-12-08 ENCOUNTER — Ambulatory Visit (HOSPITAL_COMMUNITY)
Admission: EM | Admit: 2019-12-08 | Discharge: 2019-12-08 | Disposition: A | Discharge status: Home / Self Care | Payer: Self-pay | Attending: Family Medicine | Admitting: Family Medicine

## 2019-12-08 DIAGNOSIS — H53143 Visual discomfort, bilateral: Secondary | ICD-10-CM

## 2019-12-08 DIAGNOSIS — B309 Viral conjunctivitis, unspecified: Secondary | ICD-10-CM

## 2019-12-08 DIAGNOSIS — R739 Hyperglycemia, unspecified: Secondary | ICD-10-CM

## 2019-12-08 DIAGNOSIS — H538 Other visual disturbances: Secondary | ICD-10-CM

## 2019-12-08 LAB — CBG MONITORING, ED: Glucose-Capillary: 325 mg/dL — ABNORMAL HIGH (ref 70–99)

## 2019-12-08 MED ORDER — ERYTHROMYCIN 5 MG/GM OP OINT: TOPICAL_OINTMENT | Freq: Three times a day (TID) | OPHTHALMIC | Status: DC | PRN

## 2019-12-08 MED ORDER — PREDNISONE 20 MG PO TABS: 20.0000 mg | ORAL_TABLET | Freq: Once | ORAL | Status: DC

## 2019-12-08 MED ORDER — ERYTHROMYCIN 5 MG/GM OP OINT
TOPICAL_OINTMENT | OPHTHALMIC | Status: AC
  Filled 2019-12-08: qty 3.5

## 2019-12-08 MED ORDER — PREDNISONE 20 MG PO TABS
ORAL_TABLET | ORAL | Status: AC
  Filled 2019-12-08: qty 1

## 2019-12-08 NOTE — ED Triage Notes (Addendum)
Pt is here with blurred vision bilaterally in both eyes that started a week ago, pt has not taken anything to relieve discomfort.

## 2019-12-08 NOTE — Discharge Instructions (Signed)
Use eye ointment for a few days Just a little in each eye REDUCE SWEETS in your diet Go to [facility]ness clinic for diabetes

## 2019-12-08 NOTE — ED Provider Notes (Addendum)
Mercedes Valdez    CSN: [id number] Arrival date & time: 12/08/19  1802      History   Chief Complaint Chief Complaint  Patient presents with  . Blurred Vision    HPI Mercedes Valdez is a 24 y.o. female.   HPI  Patient states that she has been diabetic since she was 68. She has not had any diabetic medicines for over 8 months. States she cannot afford them. She is here for blurry vision for 2 weeks. Also has some urinary frequency. Is sitting on the exam table drinking an orange soda She states she is also had some redness in her eyes. Left 1st and now right. Mild photophobia. Some tearing but no purulent discharge. No known exposure to illness No history of allergies Has not used any eyedrops  Past Medical History:  Diagnosis Date  . ADHD (attention deficit hyperactivity disorder)   . Asthma   . Diabetes mellitus    Type 2  . Elevated blood pressure   . Obesity   . PCOS (polycystic ovarian syndrome)     Patient Active Problem List   Diagnosis Date Noted  . Psychosocial stressors 09/26/2014  . Insulin dependent type 2 diabetes mellitus ([place]) 09/06/2014  . Bacterial vaginosis 09/05/2014  . STI (sexually transmitted infection) 09/05/2014  . Multiple drug allergies 02/27/2014  . Musculoskeletal pain 04/08/2013  . Unspecified gastritis and gastroduodenitis without mention of hemorrhage 04/08/2013  . Type 2 diabetes mellitus ([place]) 04/08/2013  . Irregular menses 04/08/2013  . Dysfunctional uterine bleeding 11/09/2012  . PCOS (polycystic ovarian syndrome) 09/29/2012  . Asthma     Past Surgical History:  Procedure Laterality Date  . EXTERNAL EAR SURGERY     ear tubes    OB History   No obstetric history on file.      Home Medications    Prior to Admission medications   Medication Sig Start Date End Date Taking? Authorizing Provider  blood glucose meter kit and supplies KIT Dispense based on patient and insurance preference. Use up to four times  daily as directed. (FOR ICD-10 E.11.9) 09/20/18   Scot Jun, FNP  ibuprofen (ADVIL) 600 MG tablet Take 1 tablet (600 mg total) by mouth every 6 (six) hours as needed. 09/07/19   Melynda Ripple, MD  Insulin Pen Needle (PEN NEEDLES) 30G X 8 MM MISC 1 pen by Does not apply route daily. 09/20/18   Scot Jun, FNP  atorvastatin (LIPITOR) 20 MG tablet Take 1 tablet (20 mg total) by mouth daily. To lower cholesterol Patient not taking: Reported on 08/29/2019 12/13/18 08/29/19  Fulp, Ander Gaster, MD  Insulin Glargine (LANTUS SOLOSTAR) 100 UNIT/ML Solostar Pen Inject 30 Units into the skin daily. Patient not taking: Reported on 08/29/2019 01/19/19 08/29/19  Antony Blackbird, MD    Family History Family History  Problem Relation Age of Onset  . Diabetes Mother   . Vision loss Mother   . ADD / ADHD Brother   . Allergies Brother   . Asthma Brother   . Vision loss Brother   . Cancer Paternal Grandfather        Colon Cancer  . Heart disease Father 38       died from MI at age 22    Social History Social History   Tobacco Use  . Smoking status: Current Every Day Smoker    Packs/day: 0.20    Types: Cigarettes  . Smokeless tobacco: Never Used  Substance Use Topics  . Alcohol use:  Yes  . Drug use: Yes    Types: Marijuana     Allergies   Amoxicillin, Augmentin [amoxicillin-pot clavulanate], Penicillins, Shellfish allergy, and Suprax [cefixime]   Review of Systems Review of Systems See HPI  Physical Exam Triage Vital Signs ED Triage Vitals  Enc Vitals Group     BP 12/08/19 2000 (!) 135/91     Pulse Rate 12/08/19 2000 87     Resp 12/08/19 2000 16     Temp 12/08/19 2000 (!) 97.4 F (36.3 C)     Temp Source 12/08/19 2000 Oral     SpO2 12/08/19 2000 100 %     Weight --      Height --      Head Circumference --      Peak Flow --      Pain Score 12/08/19 1953 0     Pain Loc --      Pain Edu? --      Excl. in [place]? --    No data found.  Updated Vital Signs BP (!) 135/91 (BP  Location: Left Arm)   Pulse 87   Temp (!) 97.4 F (36.3 C) (Oral)   Resp 16   LMP 11/09/2019   SpO2 100%   Visual Acuity Right Eye Distance: 20/40 Left Eye Distance: 20/40 Bilateral Distance: 20/30  Wears glasses, did not bring them to the visit    Physical Exam Constitutional:      General: She is not in acute distress.    Appearance: She is well-developed.     Comments: Overweight  HENT:     Head: Normocephalic and atraumatic.  Eyes:     Pupils: Pupils are equal, round, and reactive to light.     Comments: Right eye has mild injection. Both eyes have arcus senilis. There is no tearing. Mild photophobia. Pupils equal and reactive. Unable to appreciate disks due to photophobia.  Cardiovascular:     Rate and Rhythm: Normal rate.  Pulmonary:     Effort: Pulmonary effort is normal. No respiratory distress.  Abdominal:     General: There is no distension.     Palpations: Abdomen is soft.  Musculoskeletal:        General: Normal range of motion.     Cervical back: Normal range of motion.  Skin:    General: Skin is warm and dry.  Neurological:     Mental Status: She is alert.  Psychiatric:        Mood and Affect: Mood normal.        Behavior: Behavior normal.     Comments: Poor judgment, poor insight      UC Treatments / Results  Labs (all labs ordered are listed, but only abnormal results are displayed) Labs Reviewed  CBG MONITORING, ED - Abnormal; Notable for the following components:      Result Value   Glucose-Capillary 325 (*)    All other components within normal limits    EKG   Radiology No results found.  Procedures Procedures (including critical care time)  Medications Ordered in UC Medications  erythromycin ophthalmic ointment (has no administration in time range)  predniSONE (DELTASONE) tablet 20 mg (has no administration in time range)    Initial Impression / Assessment and Plan / UC Course  I have reviewed the triage vital signs and the  nursing notes.  Pertinent labs & imaging results that were available during my care of the patient were reviewed by me and considered in my medical decision  making (see chart for details).     Uncontrolled diabetes. Blurred vision is not uncommon with hyperglycemia. Also appears to have some photophobia and tearing, likely a viral conjunctivitis. Having 1 eye involved and then the other eye following is typical for viral infections. She states she cannot afford medicines. We will give her a single dose of prednisone for her photophobia. We will give her eye ointment to use the next couple of days. The importance of follow-up care is discussed with her Final Clinical Impressions(s) / UC Diagnoses   Final diagnoses:  Blurred vision, bilateral  Hyperglycemia  Photophobia of both eyes  Conjunctivitis, viral     Discharge Instructions     Use eye ointment for a few days Just a little in each eye REDUCE SWEETS in your diet Go to [facility] and Wellness clinic for diabetes     ED Prescriptions    None     PDMP not reviewed this encounter.   Raylene Everts, MD 12/08/19 2047    Raylene Everts, MD 12/08/19 2133

## 2020-02-17 ENCOUNTER — Other Ambulatory Visit: Payer: Self-pay

## 2020-02-17 DIAGNOSIS — Z20822 Contact with and (suspected) exposure to covid-19: Secondary | ICD-10-CM

## 2020-02-18 LAB — NOVEL CORONAVIRUS, NAA: SARS-CoV-2, NAA: NOT DETECTED

## 2020-02-18 LAB — SARS-COV-2, NAA 2 DAY TAT

## 2020-03-02 ENCOUNTER — Other Ambulatory Visit: Payer: Self-pay

## 2020-03-02 DIAGNOSIS — Z20822 Contact with and (suspected) exposure to covid-19: Secondary | ICD-10-CM

## 2020-03-03 LAB — NOVEL CORONAVIRUS, NAA: SARS-CoV-2, NAA: NOT DETECTED

## 2020-03-03 LAB — SARS-COV-2, NAA 2 DAY TAT

## 2020-03-09 ENCOUNTER — Other Ambulatory Visit: Payer: Self-pay

## 2020-03-09 ENCOUNTER — Emergency Department (HOSPITAL_COMMUNITY): Payer: HRSA Program

## 2020-03-09 ENCOUNTER — Emergency Department (HOSPITAL_COMMUNITY)
Admission: EM | Admit: 2020-03-09 | Discharge: 2020-03-09 | Disposition: A | Discharge status: Home / Self Care | Payer: HRSA Program | Attending: Emergency Medicine | Admitting: Emergency Medicine

## 2020-03-09 DIAGNOSIS — Z79899 Other long term (current) drug therapy: Secondary | ICD-10-CM | POA: Insufficient documentation

## 2020-03-09 DIAGNOSIS — U071 COVID-19: Secondary | ICD-10-CM | POA: Diagnosis not present

## 2020-03-09 DIAGNOSIS — Z8616 Personal history of COVID-19: Secondary | ICD-10-CM

## 2020-03-09 DIAGNOSIS — E119 Type 2 diabetes mellitus without complications: Secondary | ICD-10-CM | POA: Insufficient documentation

## 2020-03-09 DIAGNOSIS — J45909 Unspecified asthma, uncomplicated: Secondary | ICD-10-CM | POA: Insufficient documentation

## 2020-03-09 DIAGNOSIS — F17210 Nicotine dependence, cigarettes, uncomplicated: Secondary | ICD-10-CM | POA: Insufficient documentation

## 2020-03-09 DIAGNOSIS — Z794 Long term (current) use of insulin: Secondary | ICD-10-CM | POA: Insufficient documentation

## 2020-03-09 DIAGNOSIS — R079 Chest pain, unspecified: Secondary | ICD-10-CM | POA: Diagnosis present

## 2020-03-09 HISTORY — DX: Personal history of COVID-19: Z86.16

## 2020-03-09 LAB — I-STAT BETA HCG BLOOD, ED (MC, WL, AP ONLY): I-stat hCG, quantitative: <5 m[IU]/mL (ref <5)

## 2020-03-09 LAB — BASIC METABOLIC PANEL
Anion gap: 13 (ref 5–15)
BUN: 10 mg/dL (ref 6–20)
CO2: 23 mmol/L (ref 22–32)
Calcium: 9.1 mg/dL (ref 8.9–10.3)
Chloride: 97 mmol/L — ABNORMAL LOW (ref 98–111)
Creatinine, Ser: 0.76 mg/dL (ref 0.44–1.00)
GFR, Estimated: >60 mL/min (ref >60)
Glucose, Bld: 439 mg/dL — ABNORMAL HIGH (ref 70–99)
Potassium: 4.1 mmol/L (ref 3.5–5.1)
Sodium: 133 mmol/L — ABNORMAL LOW (ref 135–145)

## 2020-03-09 LAB — RESPIRATORY PANEL BY RT PCR (FLU A&B, COVID)
Influenza A by PCR: NEGATIVE
Influenza B by PCR: NEGATIVE
SARS Coronavirus 2 by RT PCR: POSITIVE — AB

## 2020-03-09 LAB — CBC
HCT: 40 % (ref 36.0–46.0)
Hemoglobin: 12.9 g/dL (ref 12.0–15.0)
MCH: 26.8 pg (ref 26.0–34.0)
MCHC: 32.3 g/dL (ref 30.0–36.0)
MCV: 83.2 fL (ref 80.0–100.0)
Platelets: 530 10*3/uL — ABNORMAL HIGH (ref 150–400)
RBC: 4.81 MIL/uL (ref 3.87–5.11)
RDW: 13 % (ref 11.5–15.5)
WBC: 6 10*3/uL (ref 4.0–10.5)
nRBC: 0 % (ref 0.0–0.2)

## 2020-03-09 LAB — CBG MONITORING, ED: Glucose-Capillary: 321 mg/dL — ABNORMAL HIGH (ref 70–99)

## 2020-03-09 LAB — TROPONIN I (HIGH SENSITIVITY)
Troponin I (High Sensitivity): 3 ng/L (ref <18)
Troponin I (High Sensitivity): 4 ng/L (ref <18)

## 2020-03-09 LAB — D-DIMER, QUANTITATIVE: D-Dimer, Quant: 0.3 ug/mL-FEU (ref 0.00–0.50)

## 2020-03-09 MED ORDER — INSULIN ASPART 100 UNIT/ML ~~LOC~~ SOLN: 10.0000 [IU] | Freq: Once | SUBCUTANEOUS | Status: DC

## 2020-03-09 NOTE — ED Provider Notes (Signed)
[facility] Provider Note   CSN: [id number] Arrival date & time: 03/09/20  0000     History Chief Complaint  Patient presents with  . Chest Pain    Mercedes Valdez is a 24 y.o. female.  Patient presents to the emergency department for evaluation of chest pain.  Patient has been experiencing pain in the center of her chest when she takes a deep breath and when she coughs.  Patient reports that she has had a persistent cough for some days that is now improving.  She is unaware of any fevers.        Past Medical History:  Diagnosis Date  . ADHD (attention deficit hyperactivity disorder)   . Asthma   . Diabetes mellitus    Type 2  . Elevated blood pressure   . Obesity   . PCOS (polycystic ovarian syndrome)     Patient Active Problem List   Diagnosis Date Noted  . Psychosocial stressors 09/26/2014  . Insulin dependent type 2 diabetes mellitus ([place]) 09/06/2014  . Bacterial vaginosis 09/05/2014  . STI (sexually transmitted infection) 09/05/2014  . Multiple drug allergies 02/27/2014  . Musculoskeletal pain 04/08/2013  . Unspecified gastritis and gastroduodenitis without mention of hemorrhage 04/08/2013  . Type 2 diabetes mellitus ([place]) 04/08/2013  . Irregular menses 04/08/2013  . Dysfunctional uterine bleeding 11/09/2012  . PCOS (polycystic ovarian syndrome) 09/29/2012  . Asthma     Past Surgical History:  Procedure Laterality Date  . EXTERNAL EAR SURGERY     ear tubes     OB History   No obstetric history on file.     Family History  Problem Relation Age of Onset  . Diabetes Mother   . Vision loss Mother   . ADD / ADHD Brother   . Allergies Brother   . Asthma Brother   . Vision loss Brother   . Cancer Paternal Grandfather        Colon Cancer  . Heart disease Father 38       died from MI at age 23    Social History   Tobacco Use  . Smoking status: Current Every Day Smoker    Packs/day: 0.20    Types:  Cigarettes  . Smokeless tobacco: Never Used  Substance Use Topics  . Alcohol use: Yes  . Drug use: Yes    Types: Marijuana    Home Medications Prior to Admission medications   Medication Sig Start Date End Date Taking? Authorizing Provider  blood glucose meter kit and supplies KIT Dispense based on patient and insurance preference. Use up to four times daily as directed. (FOR ICD-10 E.11.9) 09/20/18   Scot Jun, FNP  ibuprofen (ADVIL) 600 MG tablet Take 1 tablet (600 mg total) by mouth every 6 (six) hours as needed. 09/07/19   Melynda Ripple, MD  Insulin Pen Needle (PEN NEEDLES) 30G X 8 MM MISC 1 pen by Does not apply route daily. 09/20/18   Scot Jun, FNP  atorvastatin (LIPITOR) 20 MG tablet Take 1 tablet (20 mg total) by mouth daily. To lower cholesterol Patient not taking: Reported on 08/29/2019 12/13/18 08/29/19  Fulp, Ander Gaster, MD  Insulin Glargine (LANTUS SOLOSTAR) 100 UNIT/ML Solostar Pen Inject 30 Units into the skin daily. Patient not taking: Reported on 08/29/2019 01/19/19 08/29/19  Antony Blackbird, MD    Allergies    Amoxicillin, Augmentin [amoxicillin-pot clavulanate], Penicillins, Shellfish allergy, and Suprax [cefixime]  Review of Systems   Review of Systems  Respiratory: Positive for cough.   Cardiovascular: Positive for chest pain.  All other systems reviewed and are negative.   Physical Exam Updated Vital Signs BP 122/67 (BP Location: Right Arm)   Pulse (!) 101   Temp 99.7 F (37.6 C) (Oral)   Resp 17   Ht 5\' 4"  (1.626 m)   Wt 102.1 kg   LMP 02/25/2020   SpO2 99%   BMI 38.62 kg/m   Physical Exam Vitals and nursing note reviewed.  Constitutional:      General: She is not in acute distress.    Appearance: Normal appearance. She is well-developed.  HENT:     Head: Normocephalic and atraumatic.     Right Ear: Hearing normal.     Left Ear: Hearing normal.     Nose: Nose normal.  Eyes:     Conjunctiva/sclera: Conjunctivae normal.     Pupils:  Pupils are equal, round, and reactive to light.  Cardiovascular:     Rate and Rhythm: Regular rhythm.     Heart sounds: S1 normal and S2 normal. No murmur heard.  No friction rub. No gallop.   Pulmonary:     Effort: Pulmonary effort is normal. No respiratory distress.     Breath sounds: Normal breath sounds.  Chest:     Chest wall: No tenderness.  Abdominal:     General: Bowel sounds are normal.     Palpations: Abdomen is soft.     Tenderness: There is no abdominal tenderness. There is no guarding or rebound. Negative signs include Murphy's sign and McBurney's sign.     Hernia: No hernia is present.  Musculoskeletal:        General: Normal range of motion.     Cervical back: Normal range of motion and neck supple.  Skin:    General: Skin is warm and dry.     Findings: No rash.  Neurological:     Mental Status: She is alert and oriented to person, place, and time.     GCS: GCS eye subscore is 4. GCS verbal subscore is 5. GCS motor subscore is 6.     Cranial Nerves: No cranial nerve deficit.     Sensory: No sensory deficit.     Coordination: Coordination normal.  Psychiatric:        Speech: Speech normal.        Behavior: Behavior normal.        Thought Content: Thought content normal.     ED Results / Procedures / Treatments   Labs (all labs ordered are listed, but only abnormal results are displayed) Labs Reviewed  RESPIRATORY PANEL BY RT PCR (FLU A&B, COVID) - Abnormal; Notable for the following components:      Result Value   SARS Coronavirus 2 by RT PCR POSITIVE (*)    All other components within normal limits  BASIC METABOLIC PANEL - Abnormal; Notable for the following components:   Sodium 133 (*)    Chloride 97 (*)    Glucose, Bld 439 (*)    All other components within normal limits  CBC - Abnormal; Notable for the following components:   Platelets 530 (*)    All other components within normal limits  D-DIMER, QUANTITATIVE (NOT AT [facility])  I-STAT BETA HCG BLOOD,  ED ([facility], [facility], [facility] ONLY)  TROPONIN I (HIGH SENSITIVITY)  TROPONIN I (HIGH SENSITIVITY)    EKG EKG Interpretation  Date/Time:  Friday March 09 2020 00:04:55 EDT Ventricular Rate:  115 PR Interval:  124  QRS Duration: 78 QT Interval:  332 QTC Calculation: 459 R Axis:   38 Text Interpretation: Sinus tachycardia Nonspecific T wave abnormality Abnormal ECG Confirmed by Orpah Greek [phone]) on 03/09/2020 1:16:39 AM   Radiology DG Chest 2 View  Result Date: 03/09/2020 CLINICAL DATA:  Chest pain EXAM: CHEST - 2 VIEW COMPARISON:  07/07/2017 FINDINGS: The heart size and mediastinal contours are within normal limits. Both lungs are clear. The visualized skeletal structures are unremarkable. IMPRESSION: No active cardiopulmonary disease. Electronically Signed   By: Ulyses Jarred M.D.   On: 03/09/2020 01:19    Procedures Procedures (including critical care time)  Medications Ordered in ED Medications  insulin aspart (novoLOG) injection 10 Units (has no administration in time range)    ED Course  I have reviewed the triage vital signs and the nursing notes.  Pertinent labs & imaging results that were available during my care of the patient were reviewed by me and considered in my medical decision making (see chart for details).    MDM Rules/Calculators/A&P                          Patient appears well.  She is experiencing central chest pain that worsens with taking a deep breath and with coughing.  Cardiac evaluation is unremarkable.  D-dimer is normal.  She is not hypoxic, tachypneic or tachycardic.  Doubt PE.  Chest x-ray did not show evidence of pneumonia or other pathology.  Her Covid swab, however, was positive.  She is likely having chest wall pain or lung pain from cough from Covid but does not require hospitalization.  She is not experiencing any hypoxia.  She is a candidate for monoclonal antibody therapy.  Will place consult for outpatient infusion.  Final Clinical  Impression(s) / ED Diagnoses Final diagnoses:  COVID-19    Rx / DC Orders ED Discharge Orders    None       Andrell Tallman, Gwenyth Allegra, MD 03/09/20 [phone]

## 2020-03-09 NOTE — ED Triage Notes (Signed)
Pt said she has been having chest pain x 2 days. Pt said in the center of her chest. Pt said hurts when she takes a deep breath and coughs.

## 2020-03-10 ENCOUNTER — Other Ambulatory Visit: Payer: Self-pay | Admitting: Nurse Practitioner

## 2020-03-10 ENCOUNTER — Telehealth (HOSPITAL_COMMUNITY): Payer: Self-pay

## 2020-03-10 DIAGNOSIS — Z794 Long term (current) use of insulin: Secondary | ICD-10-CM

## 2020-03-10 DIAGNOSIS — E1165 Type 2 diabetes mellitus with hyperglycemia: Secondary | ICD-10-CM

## 2020-03-10 DIAGNOSIS — U071 COVID-19: Secondary | ICD-10-CM

## 2020-03-10 NOTE — Telephone Encounter (Signed)
Prescreened patient for MAB infusions. Patient qualifies based off off medical history (diabetes, HTN, BMI >25) Patient is interested in learning more; forwarded information to APP's for additional screening/scheduling.   Luiza Carranco Lorita Officer, RN

## 2020-03-10 NOTE — Progress Notes (Signed)
I connected by phone with Mercedes Valdez on 03/10/2020 at 9:18 AM to discuss the potential use of a new treatment for mild to moderate COVID-19 viral infection in non-hospitalized patients.  This patient is a 24 y.o. female that meets the FDA criteria for Emergency Use Authorization of COVID monoclonal antibody casirivimab/imdevimab or bamlanivimab/eteseviamb.  Has a (+) direct SARS-CoV-2 viral test result  Has mild or moderate COVID-19   Is NOT hospitalized due to COVID-19  Is within 10 days of symptom onset  Has at least one of the high risk factor(s) for progression to severe COVID-19 and/or hospitalization as defined in EUA.  Specific high risk criteria : BMI > 25 and Diabetes   I have spoken and communicated the following to the patient or parent/caregiver regarding COVID monoclonal antibody treatment:  1. FDA has authorized the emergency use for the treatment of mild to moderate COVID-19 in adults and pediatric patients with positive results of direct SARS-CoV-2 viral testing who are 63 years of age and older weighing at least 40 kg, and who are at high risk for progressing to severe COVID-19 and/or hospitalization.  2. The significant known and potential risks and benefits of COVID monoclonal antibody, and the extent to which such potential risks and benefits are unknown.  3. Information on available alternative treatments and the risks and benefits of those alternatives, including clinical trials.  4. Patients treated with COVID monoclonal antibody should continue to self-isolate and use infection control measures (e.g., wear mask, isolate, social distance, avoid sharing personal items, clean and disinfect "high touch" surfaces, and frequent handwashing) according to CDC guidelines.   5. The patient or parent/caregiver has the option to accept or refuse COVID monoclonal antibody treatment.  After reviewing this information with the patient, the patient has agreed to receive  one of the available covid 19 monoclonal antibodies and will be provided an appropriate fact sheet prior to infusion. Jobe Gibbon, NP 03/10/2020 9:18 AM

## 2020-03-11 ENCOUNTER — Ambulatory Visit (HOSPITAL_COMMUNITY)
Admission: RE | Admit: 2020-03-11 | Discharge: 2020-03-11 | Disposition: A | Discharge status: Home / Self Care | Payer: HRSA Program | Source: Ambulatory Visit | Attending: Pulmonary Disease | Admitting: Pulmonary Disease

## 2020-03-11 DIAGNOSIS — E1165 Type 2 diabetes mellitus with hyperglycemia: Secondary | ICD-10-CM | POA: Diagnosis present

## 2020-03-11 DIAGNOSIS — U071 COVID-19: Secondary | ICD-10-CM

## 2020-03-11 DIAGNOSIS — Z794 Long term (current) use of insulin: Secondary | ICD-10-CM | POA: Insufficient documentation

## 2020-03-11 MED ORDER — SODIUM CHLORIDE 0.9 % IV SOLN: Freq: Once | INTRAVENOUS | Status: AC

## 2020-03-11 MED ORDER — EPINEPHRINE 0.3 MG/0.3ML IJ SOAJ: 0.3000 mg | Freq: Once | INTRAMUSCULAR | Status: DC | PRN

## 2020-03-11 MED ORDER — SODIUM CHLORIDE 0.9 % IV SOLN: INTRAVENOUS | Status: DC | PRN

## 2020-03-11 MED ORDER — DIPHENHYDRAMINE HCL 50 MG/ML IJ SOLN: 50.0000 mg | Freq: Once | INTRAMUSCULAR | Status: DC | PRN

## 2020-03-11 MED ORDER — FAMOTIDINE IN NACL 20-0.9 MG/50ML-% IV SOLN: 20.0000 mg | Freq: Once | INTRAVENOUS | Status: DC | PRN

## 2020-03-11 MED ORDER — METHYLPREDNISOLONE SODIUM SUCC 125 MG IJ SOLR: 125.0000 mg | Freq: Once | INTRAMUSCULAR | Status: DC | PRN

## 2020-03-11 MED ORDER — ALBUTEROL SULFATE HFA 108 (90 BASE) MCG/ACT IN AERS: 2.0000 | INHALATION_SPRAY | Freq: Once | RESPIRATORY_TRACT | Status: DC | PRN

## 2020-03-11 NOTE — Progress Notes (Signed)
  Diagnosis: COVID-19  Physician: Dr. Joya Gaskins  Procedure: Covid Infusion Clinic Med: bamlanivimab\etesevimab infusion - Provided patient with bamlanimivab\etesevimab fact sheet for patients, parents and caregivers prior to infusion.  Complications: No immediate complications noted.  Discharge: Discharged home   Gabriel Cirri 03/11/2020

## 2020-03-11 NOTE — Discharge Instructions (Signed)

## 2020-05-28 ENCOUNTER — Emergency Department (HOSPITAL_COMMUNITY)
Admission: EM | Admit: 2020-05-28 | Discharge: 2020-05-29 | Disposition: A | Discharge status: Home / Self Care | Payer: Self-pay | Attending: Emergency Medicine | Admitting: Emergency Medicine

## 2020-05-28 ENCOUNTER — Ambulatory Visit (HOSPITAL_COMMUNITY)
Admission: EM | Admit: 2020-05-28 | Discharge: 2020-05-28 | Disposition: A | Discharge status: Home / Self Care | Payer: Self-pay

## 2020-05-28 ENCOUNTER — Other Ambulatory Visit: Payer: Self-pay

## 2020-05-28 ENCOUNTER — Encounter (HOSPITAL_COMMUNITY): Payer: Self-pay

## 2020-05-28 DIAGNOSIS — R1031 Right lower quadrant pain: Secondary | ICD-10-CM

## 2020-05-28 DIAGNOSIS — J45909 Unspecified asthma, uncomplicated: Secondary | ICD-10-CM | POA: Insufficient documentation

## 2020-05-28 DIAGNOSIS — A084 Viral intestinal infection, unspecified: Secondary | ICD-10-CM | POA: Insufficient documentation

## 2020-05-28 DIAGNOSIS — Z794 Long term (current) use of insulin: Secondary | ICD-10-CM | POA: Insufficient documentation

## 2020-05-28 DIAGNOSIS — F17210 Nicotine dependence, cigarettes, uncomplicated: Secondary | ICD-10-CM | POA: Insufficient documentation

## 2020-05-28 DIAGNOSIS — E119 Type 2 diabetes mellitus without complications: Secondary | ICD-10-CM | POA: Insufficient documentation

## 2020-05-28 LAB — CBC
HCT: 39 % (ref 36.0–46.0)
Hemoglobin: 12.9 g/dL (ref 12.0–15.0)
MCH: 27.5 pg (ref 26.0–34.0)
MCHC: 33.1 g/dL (ref 30.0–36.0)
MCV: 83.2 fL (ref 80.0–100.0)
Platelets: 570 K/uL — ABNORMAL HIGH (ref 150–400)
RBC: 4.69 MIL/uL (ref 3.87–5.11)
RDW: 13.5 % (ref 11.5–15.5)
WBC: 8.9 K/uL (ref 4.0–10.5)
nRBC: 0 % (ref 0.0–0.2)

## 2020-05-28 LAB — COMPREHENSIVE METABOLIC PANEL WITH GFR
ALT: 12 U/L (ref 0–44)
AST: 16 U/L (ref 15–41)
Albumin: 3.6 g/dL (ref 3.5–5.0)
Alkaline Phosphatase: 71 U/L (ref 38–126)
Anion gap: 14 (ref 5–15)
BUN: 5 mg/dL — ABNORMAL LOW (ref 6–20)
CO2: 24 mmol/L (ref 22–32)
Calcium: 9 mg/dL (ref 8.9–10.3)
Chloride: 96 mmol/L — ABNORMAL LOW (ref 98–111)
Creatinine, Ser: 0.61 mg/dL (ref 0.44–1.00)
GFR, Estimated: >60 mL/min
Glucose, Bld: 386 mg/dL — ABNORMAL HIGH (ref 70–99)
Potassium: 3.8 mmol/L (ref 3.5–5.1)
Sodium: 134 mmol/L — ABNORMAL LOW (ref 135–145)
Total Bilirubin: 0.7 mg/dL (ref 0.3–1.2)
Total Protein: 7.3 g/dL (ref 6.5–8.1)

## 2020-05-28 LAB — I-STAT BETA HCG BLOOD, ED (MC, WL, AP ONLY): I-stat hCG, quantitative: <5 m[IU]/mL (ref <5)

## 2020-05-28 LAB — URINALYSIS, ROUTINE W REFLEX MICROSCOPIC
Bilirubin Urine: NEGATIVE
Glucose, UA: >=500 mg/dL — AB
Hgb urine dipstick: NEGATIVE
Ketones, ur: NEGATIVE mg/dL
Nitrite: NEGATIVE
Protein, ur: NEGATIVE mg/dL
Specific Gravity, Urine: 1.015 (ref 1.005–1.030)
pH: 5.5 (ref 5.0–8.0)

## 2020-05-28 LAB — URINALYSIS, MICROSCOPIC (REFLEX): RBC / HPF: NONE SEEN RBC/hpf (ref 0–5)

## 2020-05-28 LAB — LIPASE, BLOOD: Lipase: 24 U/L (ref 11–51)

## 2020-05-28 MED ORDER — SODIUM CHLORIDE 0.9 % IV BOLUS
1000.0000 mL | Freq: Once | INTRAVENOUS | Status: AC
  Administered 2020-05-28: 1000 mL via INTRAVENOUS

## 2020-05-28 MED ORDER — ONDANSETRON HCL 4 MG/2ML IJ SOLN
4.0000 mg | Freq: Once | INTRAMUSCULAR | Status: AC
  Administered 2020-05-29: 4 mg via INTRAVENOUS
  Filled 2020-05-28: qty 2

## 2020-05-28 NOTE — ED Notes (Signed)
PT instructed to go to ED

## 2020-05-28 NOTE — ED Triage Notes (Signed)
Pt presents with generalized abdominal pain, headache, and nausea since Friday.

## 2020-05-28 NOTE — ED Triage Notes (Signed)
Pt arrives from [facility] for further eval of abdominal pain, n/v/d since Friday. Drinking large drink during triage. Sent here for possible appendicitis.

## 2020-05-28 NOTE — Discharge Instructions (Addendum)
Please go to the ER at [facility] for evaluation of your abdominal pain as it is concerning for appendicitis.

## 2020-05-28 NOTE — ED Provider Notes (Signed)
[facility]    CSN: [id number] Arrival date & time: 05/28/20  1008      History   Chief Complaint Chief Complaint  Patient presents with  . Headache  . Abdominal Pain  . Nausea    HPI Mercedes Valdez is a 25 y.o. female.   HPI   25 year old female here for evaluation of abdominal pain, headache, nausea.  Patient reports that she has had symptoms for last 3 days.  Patient states that she has had nausea, vomiting, diarrhea, nonproductive cough, mild shortness of breath.  Patient denies fever, URI symptoms, sore throat, body aches, change in sense of taste or smell, painful urination, or urinary urgency.  Patient is diabetic so she states that she does have frequent urination.  Patient states that she supposed be taking glipizide and Lantus but she has not taken any medication since the beginning of 2021 due to a lack of insurance and inability to afford medication.  Patient had Covid in October 2021.  Past Medical History:  Diagnosis Date  . ADHD (attention deficit hyperactivity disorder)   . Asthma   . Diabetes mellitus    Type 2  . Elevated blood pressure   . Obesity   . PCOS (polycystic ovarian syndrome)     Patient Active Problem List   Diagnosis Date Noted  . Psychosocial stressors 09/26/2014  . Insulin dependent type 2 diabetes mellitus ([place]) 09/06/2014  . Bacterial vaginosis 09/05/2014  . STI (sexually transmitted infection) 09/05/2014  . Multiple drug allergies 02/27/2014  . Musculoskeletal pain 04/08/2013  . Unspecified gastritis and gastroduodenitis without mention of hemorrhage 04/08/2013  . Type 2 diabetes mellitus ([place]) 04/08/2013  . Irregular menses 04/08/2013  . Dysfunctional uterine bleeding 11/09/2012  . PCOS (polycystic ovarian syndrome) 09/29/2012  . Asthma     Past Surgical History:  Procedure Laterality Date  . EXTERNAL EAR SURGERY     ear tubes    OB History   No obstetric history on file.      Home Medications     Prior to Admission medications   Medication Sig Start Date End Date Taking? Authorizing Provider  blood glucose meter kit and supplies KIT Dispense based on patient and insurance preference. Use up to four times daily as directed. (FOR ICD-10 E.11.9) 09/20/18   Scot Jun, FNP  ibuprofen (ADVIL) 600 MG tablet Take 1 tablet (600 mg total) by mouth every 6 (six) hours as needed. 09/07/19   Melynda Ripple, MD  Insulin Pen Needle (PEN NEEDLES) 30G X 8 MM MISC 1 pen by Does not apply route daily. 09/20/18   Scot Jun, FNP  atorvastatin (LIPITOR) 20 MG tablet Take 1 tablet (20 mg total) by mouth daily. To lower cholesterol Patient not taking: Reported on 08/29/2019 12/13/18 08/29/19  Fulp, Ander Gaster, MD  Insulin Glargine (LANTUS SOLOSTAR) 100 UNIT/ML Solostar Pen Inject 30 Units into the skin daily. Patient not taking: Reported on 08/29/2019 01/19/19 08/29/19  Antony Blackbird, MD    Family History Family History  Problem Relation Age of Onset  . Diabetes Mother   . Vision loss Mother   . ADD / ADHD Brother   . Allergies Brother   . Asthma Brother   . Vision loss Brother   . Cancer Paternal Grandfather        Colon Cancer  . Heart disease Father 56       died from MI at age 51    Social History Social History   Tobacco Use  .  Smoking status: Current Every Day Smoker    Packs/day: 0.20    Types: Cigarettes  . Smokeless tobacco: Never Used  Substance Use Topics  . Alcohol use: Yes  . Drug use: Yes    Types: Marijuana     Allergies   Amoxicillin, Augmentin [amoxicillin-pot clavulanate], Penicillins, Shellfish allergy, and Suprax [cefixime]   Review of Systems Review of Systems  Constitutional: Negative for activity change, appetite change and fever.  HENT: Negative for congestion, ear pain, rhinorrhea and sore throat.   Respiratory: Positive for cough. Negative for shortness of breath and wheezing.   Gastrointestinal: Positive for abdominal pain, diarrhea, nausea and  vomiting.  Genitourinary: Positive for frequency. Negative for dysuria and urgency.  Musculoskeletal: Negative for arthralgias and myalgias.  Skin: Negative for rash.  Neurological: Positive for headaches.  Hematological: Negative.   Psychiatric/Behavioral: Negative.      Physical Exam Triage Vital Signs ED Triage Vitals  Enc Vitals Group     BP 05/28/20 1127 116/74     Pulse Rate 05/28/20 1127 85     Resp 05/28/20 1127 17     Temp 05/28/20 1127 98.8 F (37.1 C)     Temp Source 05/28/20 1127 Oral     SpO2 05/28/20 1127 100 %     Weight --      Height --      Head Circumference --      Peak Flow --      Pain Score 05/28/20 1128 5     Pain Loc --      Pain Edu? --      Excl. in [place]? --    No data found.  Updated Vital Signs BP 116/74 (BP Location: Right Arm)   Pulse 85   Temp 98.8 F (37.1 C) (Oral)   Resp 17   LMP 05/15/2020   SpO2 100%   Visual Acuity Right Eye Distance:   Left Eye Distance:   Bilateral Distance:    Right Eye Near:   Left Eye Near:    Bilateral Near:     Physical Exam Vitals and nursing note reviewed.  Constitutional:      General: She is not in acute distress.    Appearance: She is well-developed. She is obese.  HENT:     Head: Normocephalic and atraumatic.  Cardiovascular:     Rate and Rhythm: Normal rate and regular rhythm.     Pulses: Normal pulses.     Heart sounds: Normal heart sounds. No murmur heard. No gallop.   Pulmonary:     Effort: Pulmonary effort is normal.     Breath sounds: Normal breath sounds. No wheezing, rhonchi or rales.  Abdominal:     General: Bowel sounds are normal.     Palpations: Abdomen is soft.     Tenderness: There is abdominal tenderness. There is guarding and rebound.  Skin:    General: Skin is warm and dry.     Capillary Refill: Capillary refill takes less than 2 seconds.     Findings: No erythema or rash.  Neurological:     General: No focal deficit present.     Mental Status: She is alert and  oriented to person, place, and time.  Psychiatric:        Mood and Affect: Mood normal.        Behavior: Behavior normal.        Thought Content: Thought content normal.        Judgment: Judgment normal.  UC Treatments / Results  Labs (all labs ordered are listed, but only abnormal results are displayed) Labs Reviewed - No data to display  EKG   Radiology No results found.  Procedures Procedures (including critical care time)  Medications Ordered in UC Medications - No data to display  Initial Impression / Assessment and Plan / UC Course  I have reviewed the triage vital signs and the nursing notes.  Pertinent labs & imaging results that were available during my care of the patient were reviewed by me and considered in my medical decision making (see chart for details).   Patient is here for evaluation of abdominal pain with associated headache, nausea, vomiting, and diarrhea.  She reports that her symptoms have been going on for the past 3 days and she denies fever.  Patient is a diabetic and reports that she has not had medication in the past year.  Last clinic note from 01/19/2019 indicates that she is supposed to be taking glipizide 5 mg daily and Lantus 25 units twice daily.  Patient is not toxic appearing.  Pulmonary exam reveals clear lung sounds in all fields.  Abdominal exam reveals a protuberant abdomen that is soft with decreased bowel sounds in all quadrants and pain in the right lower quadrant with guarding and rebound.  Patient has tenderness over McBurney's point.  Patient's exam is concerning for appendicitis.  I discussed my concerns with the patient and told her that I recommend that she go to the ER for evaluation.  Patient is in agreement.   Final Clinical Impressions(s) / UC Diagnoses   Final diagnoses:  Right lower quadrant abdominal pain     Discharge Instructions     Please go to the ER at [facility] for evaluation of your abdominal pain as it is  concerning for appendicitis.    ED Prescriptions    None     PDMP not reviewed this encounter.   Margarette Canada, NP 05/28/20 1246

## 2020-05-29 ENCOUNTER — Emergency Department (HOSPITAL_COMMUNITY): Payer: Self-pay

## 2020-05-29 MED ORDER — ONDANSETRON 4 MG PO TBDP: 4.0000 mg | ORAL_TABLET | Freq: Three times a day (TID) | ORAL | 0 refills | Status: DC | PRN

## 2020-05-29 MED ORDER — IOHEXOL 300 MG/ML  SOLN
100.0000 mL | Freq: Once | INTRAMUSCULAR | Status: AC | PRN
  Administered 2020-05-29: 100 mL via INTRAVENOUS

## 2020-05-29 NOTE — ED Notes (Signed)
Patient verbalized understanding of discharge instructions. Opportunity for questions and answers.  

## 2020-05-29 NOTE — ED Notes (Signed)
Patient transported to CT 

## 2020-05-29 NOTE — Discharge Instructions (Addendum)
Your CT scan did not show any abnormalities in your abdomen or pelvis. We suspect your symptoms to be due to a viral illness. Avoid fried foods, fatty foods, greasy foods, and milk products until symptoms resolve. Drink plenty of clear liquids. We recommend the use of Zofran as prescribed for nausea/vomiting. Follow-up with your primary care doctor to ensure resolution of symptoms.

## 2020-05-29 NOTE — ED Provider Notes (Signed)
[facility] EMERGENCY DEPARTMENT Provider Note   CSN: [id number] Arrival date & time: 05/28/20  1327     History Chief Complaint  Patient presents with  . Abdominal Pain    Mercedes Valdez is a 25 y.o. female.  25 year old female with a history of diabetes, PCOS, obesity presents to the ED from urgent care for further evaluation of abdominal pain.  Began with symptoms 3 days ago.  Awoke with a headache and subsequently developed nausea, vomiting, diarrhea.  Has been able to tolerate water today.  Reports some associated abdominal pain in her lower abdomen.  This gets worse prior to a bowel movement and slightly relieves after a diarrheal episode.  Has had mild to moderate improvement in her pain with ibuprofen.  Denies history of abdominal surgeries.  No dysuria, hematuria, vaginal complaints, fever.       Past Medical History:  Diagnosis Date  . ADHD (attention deficit hyperactivity disorder)   . Asthma   . Diabetes mellitus    Type 2  . Elevated blood pressure   . Obesity   . PCOS (polycystic ovarian syndrome)     Patient Active Problem List   Diagnosis Date Noted  . Psychosocial stressors 09/26/2014  . Insulin dependent type 2 diabetes mellitus ([place]) 09/06/2014  . Bacterial vaginosis 09/05/2014  . STI (sexually transmitted infection) 09/05/2014  . Multiple drug allergies 02/27/2014  . Musculoskeletal pain 04/08/2013  . Unspecified gastritis and gastroduodenitis without mention of hemorrhage 04/08/2013  . Type 2 diabetes mellitus ([place]) 04/08/2013  . Irregular menses 04/08/2013  . Dysfunctional uterine bleeding 11/09/2012  . PCOS (polycystic ovarian syndrome) 09/29/2012  . Asthma     Past Surgical History:  Procedure Laterality Date  . EXTERNAL EAR SURGERY     ear tubes     OB History   No obstetric history on file.     Family History  Problem Relation Age of Onset  . Diabetes Mother   . Vision loss Mother   . ADD / ADHD Brother   .  Allergies Brother   . Asthma Brother   . Vision loss Brother   . Cancer Paternal Grandfather        Colon Cancer  . Heart disease Father 51       died from MI at age 93    Social History   Tobacco Use  . Smoking status: Current Every Day Smoker    Packs/day: 0.20    Types: Cigarettes  . Smokeless tobacco: Never Used  Substance Use Topics  . Alcohol use: Yes  . Drug use: Yes    Types: Marijuana    Home Medications Prior to Admission medications   Medication Sig Start Date End Date Taking? Authorizing Provider  ondansetron (ZOFRAN ODT) 4 MG disintegrating tablet Take 1 tablet (4 mg total) by mouth every 8 (eight) hours as needed for nausea or vomiting. 05/29/20  Yes Antonietta Breach, PA-C  blood glucose meter kit and supplies KIT Dispense based on patient and insurance preference. Use up to four times daily as directed. (FOR ICD-10 E.11.9) 09/20/18   Scot Jun, FNP  ibuprofen (ADVIL) 600 MG tablet Take 1 tablet (600 mg total) by mouth every 6 (six) hours as needed. 09/07/19   Melynda Ripple, MD  Insulin Pen Needle (PEN NEEDLES) 30G X 8 MM MISC 1 pen by Does not apply route daily. 09/20/18   Scot Jun, FNP  atorvastatin (LIPITOR) 20 MG tablet Take 1 tablet (20 mg total)  by mouth daily. To lower cholesterol Patient not taking: Reported on 08/29/2019 12/13/18 08/29/19  Fulp, Ander Gaster, MD  Insulin Glargine (LANTUS SOLOSTAR) 100 UNIT/ML Solostar Pen Inject 30 Units into the skin daily. Patient not taking: Reported on 08/29/2019 01/19/19 08/29/19  Antony Blackbird, MD    Allergies    Amoxicillin, Augmentin [amoxicillin-pot clavulanate], Penicillins, Shellfish allergy, and Suprax [cefixime]  Review of Systems   Review of Systems  Ten systems reviewed and are negative for acute change, except as noted in the HPI.    Physical Exam Updated Vital Signs BP 124/69   Pulse 87   Temp 98 F (36.7 C)   Resp 18   LMP 05/15/2020   SpO2 98%   Physical Exam Vitals and nursing note  reviewed.  Constitutional:      General: She is not in acute distress.    Appearance: She is well-developed and well-nourished. She is not diaphoretic.     Comments: Nontoxic appearing, obese AA female  HENT:     Head: Normocephalic and atraumatic.  Eyes:     General: No scleral icterus.    Extraocular Movements: EOM normal.     Conjunctiva/sclera: Conjunctivae normal.  Cardiovascular:     Rate and Rhythm: Normal rate and regular rhythm.     Pulses: Normal pulses.  Pulmonary:     Effort: Pulmonary effort is normal. No respiratory distress.     Breath sounds: No wheezing.     Comments: Respirations even and unlabored Abdominal:     Palpations: Abdomen is soft. There is no mass.     Tenderness: There is abdominal tenderness.     Comments: Soft, obese abdomen. There is focal RLQ TTP with mild voluntary guarding. No peritoneal signs or palpable masses.  Musculoskeletal:        General: Normal range of motion.     Cervical back: Normal range of motion.  Skin:    General: Skin is warm and dry.     Coloration: Skin is not pale.     Findings: No erythema or rash.  Neurological:     Mental Status: She is alert and oriented to person, place, and time.     Coordination: Coordination normal.     Comments: GCS 15. Patient moving all extremities spontaneously.  Psychiatric:        Mood and Affect: Mood and affect normal.        Behavior: Behavior normal.     ED Results / Procedures / Treatments   Labs (all labs ordered are listed, but only abnormal results are displayed) Labs Reviewed  COMPREHENSIVE METABOLIC PANEL - Abnormal; Notable for the following components:      Result Value   Sodium 134 (*)    Chloride 96 (*)    Glucose, Bld 386 (*)    BUN 5 (*)    All other components within normal limits  CBC - Abnormal; Notable for the following components:   Platelets 570 (*)    All other components within normal limits  URINALYSIS, ROUTINE W REFLEX MICROSCOPIC - Abnormal; Notable  for the following components:   APPearance HAZY (*)    Glucose, UA >=500 (*)    Leukocytes,Ua MODERATE (*)    All other components within normal limits  URINALYSIS, MICROSCOPIC (REFLEX) - Abnormal; Notable for the following components:   Bacteria, UA MANY (*)    All other components within normal limits  URINE CULTURE  LIPASE, BLOOD  I-STAT BETA HCG BLOOD, ED ([facility], [facility], [facility] ONLY)  EKG None  Radiology CT ABDOMEN PELVIS W CONTRAST  Result Date: 05/29/2020 CLINICAL DATA:  Right lower quadrant abdominal pain, nausea, vomiting, diarrhea EXAM: CT ABDOMEN AND PELVIS WITH CONTRAST TECHNIQUE: Multidetector CT imaging of the abdomen and pelvis was performed using the standard protocol following bolus administration of intravenous contrast. CONTRAST:  100 cc Omnipaque 300 COMPARISON:  None FINDINGS: Lower chest: No acute abnormality. Hepatobiliary: No focal liver abnormality is seen. No gallstones, gallbladder wall thickening, or biliary dilatation. Pancreas: Unremarkable Spleen: Unremarkable Adrenals/Urinary Tract: Adrenal glands are unremarkable. Kidneys are normal, without renal calculi, focal lesion, or hydronephrosis. Bladder is unremarkable. Stomach/Bowel: Stomach is within normal limits. Appendix appears normal. No evidence of bowel wall thickening, distention, or inflammatory changes. No free intraperitoneal gas or fluid. Vascular/Lymphatic: No significant vascular findings are present. No enlarged abdominal or pelvic lymph nodes. Reproductive: Uterus and bilateral adnexa are unremarkable. Other: No abdominal wall hernia identified.  Rectum unremarkable. Musculoskeletal: No acute bone abnormality. IMPRESSION: Normal examination of the abdomen and pelvis. No acute intra-abdominal pathology identified. No radiographic explanation for the patient's reported symptoms. Electronically Signed   By: Fidela Salisbury MD   On: 05/29/2020 00:24    Procedures Procedures (including critical care  time)  Medications Ordered in ED Medications  sodium chloride 0.9 % bolus 1,000 mL (0 mLs Intravenous Stopped 05/29/20 0113)  ondansetron (ZOFRAN) injection 4 mg (4 mg Intravenous Given 05/29/20 0001)  iohexol (OMNIPAQUE) 300 MG/ML solution 100 mL (100 mLs Intravenous Contrast Given 05/29/20 0008)    ED Course  I have reviewed the triage vital signs and the nursing notes.  Pertinent labs & imaging results that were available during my care of the patient were reviewed by me and considered in my medical decision making (see chart for details).    MDM Rules/Calculators/A&P                          Sent to the ED for evaluation of abdominal pain in the setting of nausea, vomiting, diarrhea.  She is hemodynamically stable, afebrile.  Labs and imaging reviewed.  Her work-up and clinical history is most consistent with viral gastroenteritis.  We will continue with outpatient supportive management.  She has been tolerating oral fluids in the ED without difficulty.  Prescription given for Zofran.  Encouraged follow-up with her primary doctor.  Discharged in stable condition with no unaddressed concerns.   Final Clinical Impression(s) / ED Diagnoses Final diagnoses:  Viral gastroenteritis    Rx / DC Orders ED Discharge Orders         Ordered    ondansetron (ZOFRAN ODT) 4 MG disintegrating tablet  Every 8 hours PRN        05/29/20 0054           Antonietta Breach, PA-C 05/29/20 0540    Horton, Barbette Hair, MD 05/30/20 [phone]

## 2020-05-30 LAB — URINE CULTURE: Culture: >=100000 — AB

## 2020-09-11 ENCOUNTER — Encounter (HOSPITAL_COMMUNITY): Payer: Self-pay | Admitting: Emergency Medicine

## 2020-09-11 ENCOUNTER — Other Ambulatory Visit: Payer: Self-pay

## 2020-09-11 ENCOUNTER — Ambulatory Visit (HOSPITAL_COMMUNITY)
Admission: EM | Admit: 2020-09-11 | Discharge: 2020-09-11 | Disposition: A | Discharge status: Home / Self Care | Payer: Self-pay

## 2020-09-11 DIAGNOSIS — J029 Acute pharyngitis, unspecified: Secondary | ICD-10-CM | POA: Insufficient documentation

## 2020-09-11 LAB — POCT RAPID STREP A, ED / UC: Streptococcus, Group A Screen (Direct): NEGATIVE

## 2020-09-11 LAB — POC URINE PREG, ED: Preg Test, Ur: NEGATIVE

## 2020-09-11 NOTE — Discharge Instructions (Signed)
I believe that your symptoms are likely cause by a virus.  Unfortunately, there is not a medication to treat this.  Please gargle with warm salt water and use Tylenol/ibuprofen for pain relief.  If you have any worsening symptoms (high fever, cannot swallow, muffled voice, worsening pain) you need to be reevaluated.

## 2020-09-11 NOTE — ED Provider Notes (Signed)
[facility]    CSN: [id number] Arrival date & time: 09/11/20  1517      History   Chief Complaint Chief Complaint  Patient presents with  . Sore Throat    HPI Mercedes Valdez is a 25 y.o. female.   Patient presents today with a 1 day history of sore throat.  She reports URI symptoms last week that she attributes to her allergies that have improved but she continues to have some nasal congestion.  Sore throat pain is rated 8 on a 0-10 pain scale, localized to posterior oropharynx without radiation, described as aching, worse with swallowing, no alleviating factors identified.  She denies any fever, significant cough, shortness of breath, dysphagia, muffled voice.  She denies any recent antibiotics.  She has tried ibuprofen without improvement of symptoms.  She denies any known sick contacts but is exposed to many people at her place of employment.  She does not receive influenza or COVID-19 vaccine.  Patient is unsure if she is pregnant as she is late for her menstrual cycle; okay with pregnancy test during her visit today.     Past Medical History:  Diagnosis Date  . ADHD (attention deficit hyperactivity disorder)   . Asthma   . Diabetes mellitus    Type 2  . Elevated blood pressure   . Obesity   . PCOS (polycystic ovarian syndrome)     Patient Active Problem List   Diagnosis Date Noted  . Psychosocial stressors 09/26/2014  . Insulin dependent type 2 diabetes mellitus ([place]) 09/06/2014  . Bacterial vaginosis 09/05/2014  . STI (sexually transmitted infection) 09/05/2014  . Multiple drug allergies 02/27/2014  . Musculoskeletal pain 04/08/2013  . Unspecified gastritis and gastroduodenitis without mention of hemorrhage 04/08/2013  . Type 2 diabetes mellitus ([place]) 04/08/2013  . Irregular menses 04/08/2013  . Dysfunctional uterine bleeding 11/09/2012  . PCOS (polycystic ovarian syndrome) 09/29/2012  . Asthma     Past Surgical History:  Procedure Laterality  Date  . EXTERNAL EAR SURGERY     ear tubes    OB History   No obstetric history on file.      Home Medications    Prior to Admission medications   Medication Sig Start Date End Date Taking? Authorizing Provider  loratadine (CLARITIN) 10 MG tablet Take 10 mg by mouth daily.   Yes [provider]  blood glucose meter kit and supplies KIT Dispense based on patient and insurance preference. Use up to four times daily as directed. (FOR ICD-10 E.11.9) 09/20/18   Scot Jun, FNP  ibuprofen (ADVIL) 600 MG tablet Take 1 tablet (600 mg total) by mouth every 6 (six) hours as needed. 09/07/19   Melynda Ripple, MD  Insulin Pen Needle (PEN NEEDLES) 30G X 8 MM MISC 1 pen by Does not apply route daily. 09/20/18   Scot Jun, FNP  ondansetron (ZOFRAN ODT) 4 MG disintegrating tablet Take 1 tablet (4 mg total) by mouth every 8 (eight) hours as needed for nausea or vomiting. 05/29/20   Antonietta Breach, PA-C  atorvastatin (LIPITOR) 20 MG tablet Take 1 tablet (20 mg total) by mouth daily. To lower cholesterol Patient not taking: Reported on 08/29/2019 12/13/18 08/29/19  Fulp, Ander Gaster, MD  Insulin Glargine (LANTUS SOLOSTAR) 100 UNIT/ML Solostar Pen Inject 30 Units into the skin daily. Patient not taking: Reported on 08/29/2019 01/19/19 08/29/19  Antony Blackbird, MD    Family History Family History  Problem Relation Age of Onset  . Diabetes Mother   .  Vision loss Mother   . ADD / ADHD Brother   . Allergies Brother   . Asthma Brother   . Vision loss Brother   . Cancer Paternal Grandfather        Colon Cancer  . Heart disease Father 6       died from MI at age 23    Social History Social History   Tobacco Use  . Smoking status: Current Every Day Smoker    Packs/day: 0.20    Types: Cigarettes  . Smokeless tobacco: Never Used  Vaping Use  . Vaping Use: Never used  Substance Use Topics  . Alcohol use: Yes  . Drug use: Yes    Types: Marijuana     Allergies   Amoxicillin, Augmentin  [amoxicillin-pot clavulanate], Penicillins, Shellfish allergy, and Suprax [cefixime]   Review of Systems Review of Systems  Constitutional: Negative for activity change, appetite change, fatigue and fever.  HENT: Positive for congestion and sore throat. Negative for postnasal drip, sinus pressure, sneezing, trouble swallowing and voice change.   Respiratory: Positive for cough. Negative for shortness of breath.   Cardiovascular: Negative for chest pain.  Gastrointestinal: Negative for abdominal pain, diarrhea, nausea and vomiting.  Genitourinary: Positive for menstrual problem.  Musculoskeletal: Negative for arthralgias and myalgias.  Neurological: Negative for dizziness, light-headedness and headaches.     Physical Exam Triage Vital Signs ED Triage Vitals  Enc Vitals Group     BP 09/11/20 1629 128/87     Pulse Rate 09/11/20 1629 (!) 110     Resp 09/11/20 1629 20     Temp 09/11/20 1629 98.8 F (37.1 C)     Temp Source 09/11/20 1629 Oral     SpO2 09/11/20 1629 98 %     Weight --      Height --      Head Circumference --      Peak Flow --      Pain Score 09/11/20 1625 8     Pain Loc --      Pain Edu? --      Excl. in [place]? --    No data found.  Updated Vital Signs BP 128/87 (BP Location: Right Arm)   Pulse (!) 110   Temp 98.8 F (37.1 C) (Oral)   Resp 20   LMP 08/01/2020   SpO2 98%   Visual Acuity Right Eye Distance:   Left Eye Distance:   Bilateral Distance:    Right Eye Near:   Left Eye Near:    Bilateral Near:     Physical Exam Vitals reviewed.  Constitutional:      General: She is awake. She is not in acute distress.    Appearance: Normal appearance. She is not ill-appearing.     Comments: Very pleasant female appears stated age in no acute distress  HENT:     Head: Normocephalic and atraumatic.     Right Ear: Tympanic membrane, ear canal and external ear normal. Tympanic membrane is not erythematous or bulging.     Left Ear: Tympanic membrane, ear  canal and external ear normal. Tympanic membrane is not erythematous or bulging.     Ears:     Comments: Cerumen occluding ear canal; able to visualize approximately 60% of TM that appears normal.    Nose:     Right Sinus: No maxillary sinus tenderness or frontal sinus tenderness.     Left Sinus: No maxillary sinus tenderness or frontal sinus tenderness.     Mouth/Throat:  Pharynx: Uvula midline. Posterior oropharyngeal erythema present. No oropharyngeal exudate.     Comments: Mild erythema and drainage in posterior oropharynx Cardiovascular:     Rate and Rhythm: Normal rate and regular rhythm.     Heart sounds: No murmur heard.   Pulmonary:     Effort: Pulmonary effort is normal.     Breath sounds: Normal breath sounds. No wheezing, rhonchi or rales.     Comments: Clear to auscultation bilaterally Lymphadenopathy:     Head:     Right side of head: No submental, submandibular or tonsillar adenopathy.     Left side of head: No submental, submandibular or tonsillar adenopathy.     Cervical: No cervical adenopathy.  Psychiatric:        Behavior: Behavior is cooperative.      UC Treatments / Results  Labs (all labs ordered are listed, but only abnormal results are displayed) Labs Reviewed  CULTURE, GROUP A STREP [facility])  POCT RAPID STREP A, ED / UC  POC URINE PREG, ED    EKG   Radiology No results found.  Procedures Procedures (including critical care time)  Medications Ordered in UC Medications - No data to display  Initial Impression / Assessment and Plan / UC Course  I have reviewed the triage vital signs and the nursing notes.  Pertinent labs & imaging results that were available during my care of the patient were reviewed by me and considered in my medical decision making (see chart for details).     Centor score of 1.  Rapid strep negative in office.  Throat culture obtained-results pending.  Suspect viral etiology given short duration of symptoms.   Encouraged patient to use supportive care including gargling with warm salt water and alternating analgesics to manage pain.  She was provided a work excuse note.  Strict return precautions given to which patient expressed understanding.  Final Clinical Impressions(s) / UC Diagnoses   Final diagnoses:  Viral pharyngitis  Sore throat     Discharge Instructions     I believe that your symptoms are likely cause by a virus.  Unfortunately, there is not a medication to treat this.  Please gargle with warm salt water and use Tylenol/ibuprofen for pain relief.  If you have any worsening symptoms (high fever, cannot swallow, muffled voice, worsening pain) you need to be reevaluated.    ED Prescriptions    None     PDMP not reviewed this encounter.   Terrilee Croak, PA-C 09/11/20 1749

## 2020-09-11 NOTE — ED Triage Notes (Signed)
Patient reports throat feeling full and dry started last night.  Right ear is hurting for a week.  Patient is having abdominal cramping-period is 8 days late today

## 2020-09-14 ENCOUNTER — Telehealth (HOSPITAL_COMMUNITY): Payer: Self-pay | Admitting: Emergency Medicine

## 2020-09-14 LAB — CULTURE, GROUP A STREP (THRC)

## 2020-09-14 MED ORDER — AZITHROMYCIN 250 MG PO TABS: 250.0000 mg | ORAL_TABLET | Freq: Every day | ORAL | 0 refills | Status: DC

## 2020-09-16 ENCOUNTER — Other Ambulatory Visit: Payer: Self-pay

## 2020-09-16 ENCOUNTER — Encounter (HOSPITAL_COMMUNITY): Payer: Self-pay

## 2020-09-16 ENCOUNTER — Ambulatory Visit (HOSPITAL_COMMUNITY)
Admission: EM | Admit: 2020-09-16 | Discharge: 2020-09-16 | Disposition: A | Discharge status: Home / Self Care | Payer: Self-pay | Attending: Family Medicine | Admitting: Family Medicine

## 2020-09-16 DIAGNOSIS — R112 Nausea with vomiting, unspecified: Secondary | ICD-10-CM

## 2020-09-16 DIAGNOSIS — Z794 Long term (current) use of insulin: Secondary | ICD-10-CM | POA: Insufficient documentation

## 2020-09-16 DIAGNOSIS — R109 Unspecified abdominal pain: Secondary | ICD-10-CM

## 2020-09-16 DIAGNOSIS — E119 Type 2 diabetes mellitus without complications: Secondary | ICD-10-CM

## 2020-09-16 DIAGNOSIS — E1165 Type 2 diabetes mellitus with hyperglycemia: Secondary | ICD-10-CM

## 2020-09-16 DIAGNOSIS — Z3201 Encounter for pregnancy test, result positive: Secondary | ICD-10-CM

## 2020-09-16 LAB — POCT URINALYSIS DIPSTICK, ED / UC
Bilirubin Urine: NEGATIVE
Glucose, UA: >=1000 mg/dL — AB
Hgb urine dipstick: NEGATIVE
Ketones, ur: NEGATIVE mg/dL
Nitrite: NEGATIVE
Protein, ur: NEGATIVE mg/dL
Specific Gravity, Urine: 1.015 (ref 1.005–1.030)
Urobilinogen, UA: 0.2 mg/dL (ref 0.0–1.0)
pH: 5.5 (ref 5.0–8.0)

## 2020-09-16 LAB — POC URINE PREG, ED: Preg Test, Ur: POSITIVE — AB

## 2020-09-16 MED ORDER — DOXYLAMINE-PYRIDOXINE 10-10 MG PO TBEC: 2.0000 | DELAYED_RELEASE_TABLET | Freq: Every day | ORAL | 0 refills | Status: DC

## 2020-09-16 NOTE — ED Triage Notes (Addendum)
Pt in with c/o lower abdominal pain since Thursday  & vomiting that started yesterday  Pt has been taking ibuprofen with minimal relief  Pt also states her LMP was 08/01/20, requesting pregnancy test

## 2020-09-16 NOTE — Discharge Instructions (Signed)
Go immediately to the ER for further evaluation if your abdominal pain worsens or if you start having vaginal bleeding or any other concerning symptoms.

## 2020-09-16 NOTE — ED Provider Notes (Signed)
[place]    CSN: [id number] Arrival date & time: 09/16/20  1234      History   Chief Complaint Chief Complaint  Patient presents with  . Abdominal Pain  . Emesis    HPI Mercedes Valdez is a 25 y.o. female.   Patient with past medical history of gastritis, PCOS, poorly controlled insulin-dependent diabetes off medications, history of sexually-transmitted infections presenting today with 3-day history of lower abdominal pain that ranges from a 5 to an 8 out of 10 and is constant and nausea vomiting that started yesterday.  She denies diarrhea, constipation, fever, chills, body aches, chest pain, shortness of breath, vaginal bleeding or discharge, dysuria, hematuria, flank pain.  So far taking ibuprofen with minimal relief.  LMP was 08/01/2020, home pregnancy test positive this morning and requesting a retest here.     Past Medical History:  Diagnosis Date  . ADHD (attention deficit hyperactivity disorder)   . Asthma   . Diabetes mellitus    Type 2  . Elevated blood pressure   . Obesity   . PCOS (polycystic ovarian syndrome)     Patient Active Problem List   Diagnosis Date Noted  . Psychosocial stressors 09/26/2014  . Insulin dependent type 2 diabetes mellitus ([place]) 09/06/2014  . Bacterial vaginosis 09/05/2014  . STI (sexually transmitted infection) 09/05/2014  . Multiple drug allergies 02/27/2014  . Musculoskeletal pain 04/08/2013  . Unspecified gastritis and gastroduodenitis without mention of hemorrhage 04/08/2013  . Type 2 diabetes mellitus ([place]) 04/08/2013  . Irregular menses 04/08/2013  . Dysfunctional uterine bleeding 11/09/2012  . PCOS (polycystic ovarian syndrome) 09/29/2012  . Asthma     Past Surgical History:  Procedure Laterality Date  . EXTERNAL EAR SURGERY     ear tubes    OB History   No obstetric history on file.      Home Medications    Prior to Admission medications   Medication Sig Start Date End Date Taking? Authorizing  Provider  Doxylamine-Pyridoxine 10-10 MG TBEC Take 2 tablets by mouth at bedtime. 09/16/20  Yes Volney American, PA-C  azithromycin (ZITHROMAX) 250 MG tablet Take 1 tablet (250 mg total) by mouth daily. Take first 2 tablets together, then 1 every day until finished. 09/14/20   Lamptey, Myrene Galas, MD  blood glucose meter kit and supplies KIT Dispense based on patient and insurance preference. Use up to four times daily as directed. (FOR ICD-10 E.11.9) 09/20/18   Scot Jun, FNP  ibuprofen (ADVIL) 600 MG tablet Take 1 tablet (600 mg total) by mouth every 6 (six) hours as needed. 09/07/19   Melynda Ripple, MD  Insulin Pen Needle (PEN NEEDLES) 30G X 8 MM MISC 1 pen by Does not apply route daily. 09/20/18   Scot Jun, FNP  loratadine (CLARITIN) 10 MG tablet Take 10 mg by mouth daily.    [provider]  ondansetron (ZOFRAN ODT) 4 MG disintegrating tablet Take 1 tablet (4 mg total) by mouth every 8 (eight) hours as needed for nausea or vomiting. 05/29/20   Antonietta Breach, PA-C  atorvastatin (LIPITOR) 20 MG tablet Take 1 tablet (20 mg total) by mouth daily. To lower cholesterol Patient not taking: Reported on 08/29/2019 12/13/18 08/29/19  Fulp, Ander Gaster, MD  Insulin Glargine (LANTUS SOLOSTAR) 100 UNIT/ML Solostar Pen Inject 30 Units into the skin daily. Patient not taking: Reported on 08/29/2019 01/19/19 08/29/19  Antony Blackbird, MD    Family History Family History  Problem Relation Age of Onset  .  Diabetes Mother   . Vision loss Mother   . ADD / ADHD Brother   . Allergies Brother   . Asthma Brother   . Vision loss Brother   . Cancer Paternal Grandfather        Colon Cancer  . Heart disease Father 7       died from MI at age 22    Social History Social History   Tobacco Use  . Smoking status: Current Every Day Smoker    Packs/day: 0.20    Types: Cigarettes  . Smokeless tobacco: Never Used  Vaping Use  . Vaping Use: Never used  Substance Use Topics  . Alcohol use: Yes  .  Drug use: Yes    Types: Marijuana     Allergies   Amoxicillin, Augmentin [amoxicillin-pot clavulanate], Penicillins, Shellfish allergy, and Suprax [cefixime]   Review of Systems Review of Systems Per HPI  Physical Exam Triage Vital Signs ED Triage Vitals  Enc Vitals Group     BP 09/16/20 1306 137/89     Pulse Rate 09/16/20 1306 86     Resp 09/16/20 1306 19     Temp 09/16/20 1306 98.6 F (37 C)     Temp src --      SpO2 09/16/20 1306 100 %     Weight --      Height --      Head Circumference --      Peak Flow --      Pain Score 09/16/20 1305 7     Pain Loc --      Pain Edu? --      Excl. in [place]? --    No data found.  Updated Vital Signs BP 137/89   Pulse 86   Temp 98.6 F (37 C)   Resp 19   LMP 08/01/2020 (Exact Date)   SpO2 100%   Visual Acuity Right Eye Distance:   Left Eye Distance:   Bilateral Distance:    Right Eye Near:   Left Eye Near:    Bilateral Near:     Physical Exam Vitals and nursing note reviewed.  Constitutional:      Appearance: Normal appearance. She is not ill-appearing.  HENT:     Head: Atraumatic.     Right Ear: Tympanic membrane normal.     Left Ear: Tympanic membrane normal.     Nose: Nose normal.     Mouth/Throat:     Mouth: Mucous membranes are moist.     Pharynx: Oropharynx is clear.  Eyes:     Extraocular Movements: Extraocular movements intact.     Conjunctiva/sclera: Conjunctivae normal.  Cardiovascular:     Rate and Rhythm: Normal rate and regular rhythm.     Heart sounds: Normal heart sounds.  Pulmonary:     Effort: Pulmonary effort is normal.     Breath sounds: Normal breath sounds.  Abdominal:     General: Bowel sounds are normal. There is no distension.     Palpations: Abdomen is soft.     Tenderness: There is no abdominal tenderness. There is no right CVA tenderness, left CVA tenderness or guarding.  Genitourinary:    Comments: GU exam deferred, self swab performed Musculoskeletal:        General: Normal  range of motion.     Cervical back: Normal range of motion and neck supple.  Skin:    General: Skin is warm and dry.  Neurological:     Mental Status: She is alert and  oriented to person, place, and time.  Psychiatric:        Mood and Affect: Mood normal.        Thought Content: Thought content normal.        Judgment: Judgment normal.     UC Treatments / Results  Labs (all labs ordered are listed, but only abnormal results are displayed) Labs Reviewed  POC URINE PREG, ED - Abnormal; Notable for the following components:      Result Value   Preg Test, Ur POSITIVE (*)    All other components within normal limits  POCT URINALYSIS DIPSTICK, ED / UC - Abnormal; Notable for the following components:   Glucose, UA >=1000 (*)    Leukocytes,Ua TRACE (*)    All other components within normal limits  CERVICOVAGINAL ANCILLARY ONLY    EKG   Radiology No results found.  Procedures Procedures (including critical care time)  Medications Ordered in UC Medications - No data to display  Initial Impression / Assessment and Plan / UC Course  I have reviewed the triage vital signs and the nursing notes.  Pertinent labs & imaging results that were available during my care of the patient were reviewed by me and considered in my medical decision making (see chart for details).     Exam and vital signs completely benign today.  Her abdomen is nontender on exam and she is resting comfortably in the room.  Pregnancy test positive here.  UA showing greater than 1000 glucose and trace leukocytes.  Patient is aware to monitor her blood sugars closely, does not wish to have blood work here and has a machine at home where she will check it and go to the hospital if numbers go above 400 and any point in time.  We will send out a urine culture to rule out urinary tract infection.  Vaginal swab also pending for further rule out.  Discussed with her that given her severity of abdominal pain and early  pregnancy, she should have an ultrasound very soon if symptoms not improving.  She declines going to the women's ED at this time and wishes to follow-up outpatient early next week with OB/GYN.  Resources given for this.  She knows to go to the hospital if her blood sugars are too elevated or if her pain worsens, starts having vaginal bleeding or other worrisome symptoms.  Final Clinical Impressions(s) / UC Diagnoses   Final diagnoses:  Positive pregnancy test  Abdominal cramping  Non-intractable vomiting with nausea, unspecified vomiting type  Insulin dependent type 2 diabetes mellitus ([place])     Discharge Instructions     Go immediately to the ER for further evaluation if your abdominal pain worsens or if you start having vaginal bleeding or any other concerning symptoms.    ED Prescriptions    Medication Sig Dispense Auth. Provider   Doxylamine-Pyridoxine 10-10 MG TBEC Take 2 tablets by mouth at bedtime. 60 tablet Volney American, [place]     PDMP not reviewed this encounter.   Volney American, [place] 09/16/20 [phone]

## 2020-09-17 LAB — CERVICOVAGINAL ANCILLARY ONLY
Bacterial Vaginitis (gardnerella): POSITIVE — AB
Candida Glabrata: NEGATIVE
Candida Vaginitis: POSITIVE — AB
Chlamydia: NEGATIVE
Comment: NEGATIVE
Comment: NEGATIVE
Comment: NEGATIVE
Comment: NEGATIVE
Comment: NEGATIVE
Comment: NORMAL
Neisseria Gonorrhea: NEGATIVE
Trichomonas: NEGATIVE

## 2020-09-19 ENCOUNTER — Inpatient Hospital Stay (HOSPITAL_COMMUNITY)
Admission: AD | Admit: 2020-09-19 | Discharge: 2020-09-19 | Disposition: A | Discharge status: Home / Self Care | Payer: Self-pay | Attending: Obstetrics and Gynecology | Admitting: Obstetrics and Gynecology

## 2020-09-19 ENCOUNTER — Other Ambulatory Visit (HOSPITAL_COMMUNITY): Payer: Self-pay

## 2020-09-19 ENCOUNTER — Other Ambulatory Visit: Payer: Self-pay

## 2020-09-19 ENCOUNTER — Telehealth (HOSPITAL_COMMUNITY): Payer: Self-pay | Admitting: Emergency Medicine

## 2020-09-19 ENCOUNTER — Encounter (HOSPITAL_COMMUNITY): Payer: Self-pay | Admitting: Obstetrics and Gynecology

## 2020-09-19 ENCOUNTER — Inpatient Hospital Stay (HOSPITAL_COMMUNITY): Payer: Self-pay

## 2020-09-19 DIAGNOSIS — R102 Pelvic and perineal pain: Secondary | ICD-10-CM

## 2020-09-19 DIAGNOSIS — F1290 Cannabis use, unspecified, uncomplicated: Secondary | ICD-10-CM | POA: Insufficient documentation

## 2020-09-19 DIAGNOSIS — O99331 Smoking (tobacco) complicating pregnancy, first trimester: Secondary | ICD-10-CM | POA: Insufficient documentation

## 2020-09-19 DIAGNOSIS — Z3490 Encounter for supervision of normal pregnancy, unspecified, unspecified trimester: Secondary | ICD-10-CM

## 2020-09-19 DIAGNOSIS — F17210 Nicotine dependence, cigarettes, uncomplicated: Secondary | ICD-10-CM | POA: Insufficient documentation

## 2020-09-19 DIAGNOSIS — O26891 Other specified pregnancy related conditions, first trimester: Secondary | ICD-10-CM | POA: Insufficient documentation

## 2020-09-19 DIAGNOSIS — O24111 Pre-existing diabetes mellitus, type 2, in pregnancy, first trimester: Secondary | ICD-10-CM

## 2020-09-19 DIAGNOSIS — E119 Type 2 diabetes mellitus without complications: Secondary | ICD-10-CM

## 2020-09-19 DIAGNOSIS — Z794 Long term (current) use of insulin: Secondary | ICD-10-CM | POA: Insufficient documentation

## 2020-09-19 DIAGNOSIS — O3680X0 Pregnancy with inconclusive fetal viability, not applicable or unspecified: Secondary | ICD-10-CM

## 2020-09-19 DIAGNOSIS — R109 Unspecified abdominal pain: Secondary | ICD-10-CM | POA: Insufficient documentation

## 2020-09-19 DIAGNOSIS — Z3A01 Less than 8 weeks gestation of pregnancy: Secondary | ICD-10-CM | POA: Insufficient documentation

## 2020-09-19 DIAGNOSIS — O24112 Pre-existing diabetes mellitus, type 2, in pregnancy, second trimester: Secondary | ICD-10-CM | POA: Insufficient documentation

## 2020-09-19 DIAGNOSIS — O99321 Drug use complicating pregnancy, first trimester: Secondary | ICD-10-CM | POA: Insufficient documentation

## 2020-09-19 LAB — CBC
HCT: 36.3 % (ref 36.0–46.0)
Hemoglobin: 11.8 g/dL — ABNORMAL LOW (ref 12.0–15.0)
MCH: 26.8 pg (ref 26.0–34.0)
MCHC: 32.5 g/dL (ref 30.0–36.0)
MCV: 82.5 fL (ref 80.0–100.0)
Platelets: 517 10*3/uL — ABNORMAL HIGH (ref 150–400)
RBC: 4.4 MIL/uL (ref 3.87–5.11)
RDW: 13.2 % (ref 11.5–15.5)
WBC: 10.2 10*3/uL (ref 4.0–10.5)
nRBC: 0 % (ref 0.0–0.2)

## 2020-09-19 LAB — URINALYSIS, ROUTINE W REFLEX MICROSCOPIC
Bacteria, UA: NONE SEEN
Bilirubin Urine: NEGATIVE
Glucose, UA: >=500 mg/dL — AB
Hgb urine dipstick: NEGATIVE
Ketones, ur: NEGATIVE mg/dL
Nitrite: NEGATIVE
Protein, ur: NEGATIVE mg/dL
Specific Gravity, Urine: 1.031 — ABNORMAL HIGH (ref 1.005–1.030)
pH: 6 (ref 5.0–8.0)

## 2020-09-19 LAB — GLUCOSE, CAPILLARY: Glucose-Capillary: 287 mg/dL — ABNORMAL HIGH (ref 70–99)

## 2020-09-19 LAB — HEMOGLOBIN A1C
Hgb A1c MFr Bld: 13.2 % — ABNORMAL HIGH (ref 4.8–5.6)
Mean Plasma Glucose: 332.14 mg/dL

## 2020-09-19 LAB — HCG, QUANTITATIVE, PREGNANCY: hCG, Beta Chain, Quant, S: 2358 m[IU]/mL — ABNORMAL HIGH (ref <5)

## 2020-09-19 MED ORDER — TERCONAZOLE 0.4 % VA CREA
1.0000 | TOPICAL_CREAM | Freq: Every day | VAGINAL | 0 refills | Status: AC
  Filled 2020-09-19: qty 45, 7d supply, fill #0

## 2020-09-19 MED ORDER — INSULIN REGULAR HUMAN 100 UNIT/ML IJ SOLN
15.0000 [IU] | Freq: Every day | INTRAMUSCULAR | 11 refills | Status: DC
  Filled 2020-09-19: qty 10, 66d supply, fill #0

## 2020-09-19 MED ORDER — INSULIN REGULAR HUMAN 100 UNIT/ML IJ SOLN
20.0000 [IU] | Freq: Every day | INTRAMUSCULAR | 1 refills | Status: DC
  Filled 2020-09-19: qty 10, 50d supply, fill #0

## 2020-09-19 MED ORDER — INSULIN ASPART 100 UNIT/ML FLEXPEN
10.0000 [IU] | PEN_INJECTOR | Freq: Three times a day (TID) | SUBCUTANEOUS | 1 refills | Status: DC
  Filled 2020-09-19: qty 9, 30d supply, fill #0
  Filled 2020-10-10: qty 9, 30d supply, fill #1

## 2020-09-19 MED ORDER — INSULIN NPH (HUMAN) (ISOPHANE) 100 UNIT/ML ~~LOC~~ SUSP
15.0000 [IU] | Freq: Every day | SUBCUTANEOUS | 1 refills | Status: DC
  Filled 2020-09-19: qty 10, 66d supply, fill #0

## 2020-09-19 MED ORDER — INSULIN NPH (HUMAN) (ISOPHANE) 100 UNIT/ML ~~LOC~~ SUSP
39.0000 [IU] | Freq: Every day | SUBCUTANEOUS | 1 refills | Status: DC
  Filled 2020-09-19: qty 10, 25d supply, fill #0

## 2020-09-19 MED ORDER — INSULIN GLARGINE 100 UNIT/ML SOLOSTAR PEN
30.0000 [IU] | PEN_INJECTOR | Freq: Every day | SUBCUTANEOUS | 0 refills | Status: DC
  Filled 2020-09-19: qty 9, 30d supply, fill #0

## 2020-09-19 MED ORDER — METRONIDAZOLE 500 MG PO TABS
500.0000 mg | ORAL_TABLET | Freq: Two times a day (BID) | ORAL | 0 refills | Status: DC
  Filled 2020-09-19: qty 14, 7d supply, fill #0

## 2020-09-19 MED ORDER — INSULIN ASPART 100 UNIT/ML ~~LOC~~ SOLN
7.0000 [IU] | Freq: Once | SUBCUTANEOUS | Status: AC
  Administered 2020-09-19: 7 [IU] via SUBCUTANEOUS

## 2020-09-19 MED ORDER — TERCONAZOLE 0.4 % VA CREA: 1.0000 | TOPICAL_CREAM | Freq: Every day | VAGINAL | 0 refills | Status: DC

## 2020-09-19 MED ORDER — PENTIPS 32G X 4 MM MISC
3 refills | Status: DC
  Filled 2020-09-19: qty 100, 30d supply, fill #0
  Filled 2020-10-10: qty 100, 30d supply, fill #1

## 2020-09-19 MED ORDER — DOXYLAMINE-PYRIDOXINE 10-10 MG PO TBEC
2.0000 | DELAYED_RELEASE_TABLET | Freq: Every day | ORAL | 1 refills | Status: DC
  Filled 2020-09-19: qty 60, fill #0

## 2020-09-19 MED ORDER — LORATADINE 10 MG PO TABS
10.0000 mg | ORAL_TABLET | Freq: Every day | ORAL | 2 refills | Status: DC
  Filled 2020-09-19: qty 30, 30d supply, fill #0

## 2020-09-19 MED ORDER — METRONIDAZOLE 0.75 % VA GEL
1.0000 | Freq: Every day | VAGINAL | 0 refills | Status: DC
Start: 2020-09-19 — End: 2020-09-19

## 2020-09-19 NOTE — Care Management Note (Addendum)
Case Management Note  Patient Details  Name: Mercedes Valdez MRN: [id number] Date of Birth: [birth year]  Subjective/Objective:                  Patient with past medical history of gastritis, PCOS, poorly controlled insulin-dependent diabetes off medications, history of sexually-transmitted infections presenting today with 3-day history of lower abdominal pain that ranges from a 5 to an 8 out of 10 and is constant and nausea vomiting that started yesterday   In-House Referral:  Diabetes Coordinator  Discharge planning Services  [facility]  Additional Comments: CM met with patient and her mother in the room.  Patient's demographics correct in system and she informed CM that she used to go to [facility] until 25 years of age and then [facility].  Patient informed CM that she had been to PCP since June 2020 due to no insurance.  Patient expressed to CM that she was on the way to the Medicaid office to apply for medicaid since pregnant and had pain so she came to the hospital but plans to go apply soon.  Patient does work 4 days a week - 10 hours shifts but does not feel like she has extra money right now.  CM did MATCH for medications this admission but encouraged patient to apply for medicaid as soon as possible.  Patient plans to follow up at [facility] on 10/16/20(per patient).  Patient today plans to get prescriptions delivered to her room by the [facility] pharmacy. CM also called [facility] and spoke to [facility] and she plans to call patient with an appointment ( PCP).  Patient will be followed by her OB for her diabetes. Patient stated she does drive and has a car, no barriers with transportation. Diabetes Coordinator saw patient, and she received a glucometer for her to take home.  Rosita Fire RNC-MNN, BSN Transitions of Care Pediatrics/Women's and [place], Mercedes Valdez [place], [place] 09/19/2020, 3:30 PM

## 2020-09-19 NOTE — MAU Note (Signed)
...  Mercedes Valdez is a 25 y.o. at [redacted]w[redacted]d here in MAU reporting: Intermittent abdominal cramping that began "a couple of weeks ago." No pain currently but states she felt the pain this morning. Denies VB or abnormal discharge.   LMP: 08/01/2020 Lab orders placed from triage: UA

## 2020-09-19 NOTE — Progress Notes (Signed)
Inpatient Diabetes Program Recommendations  AACE/ADA: New Consensus Statement on Inpatient Glycemic Control (2015)  Target Ranges:  Prepandial:   less than 140 mg/dL      Peak postprandial:   less than 180 mg/dL (1-2 hours)      Critically ill patients:  140 - 180 mg/dL   Lab Results  Component Value Date   GLUCAP 287 (H) 09/19/2020   HGBA1C 13.2 (H) 09/19/2020    Review of Glycemic Control Results for Mercedes Valdez, Mercedes Valdez (MRN [id number]) as of 09/19/2020 14:51  Ref. Range 09/19/2020 12:28  Glucose-Capillary Latest Ref Range: 70 - 99 mg/dL 287 (H)  Results for Mercedes Valdez, Mercedes Valdez (MRN [id number]) as of 09/19/2020 14:51  Ref. Range 09/19/2020 12:04  Hemoglobin A1C Latest Ref Range: 4.8 - 5.6 % 13.2 (H)   Diabetes history:  DM2 Outpatient Diabetes medications:  None   Spoke with patient and mother at bedside.  She states she has had DM2 for several years.  She used to take Lantus 50 units daily and Trulicity weekly along with metformin.  She stopped taking her medications 2 years ago due to affordability.  She does not have insurance.  She has a positive pregnancy test with LMP 08/01/20.  Discussed importance of very good glucose control during pregnancy and provided patient with a booklet on DM2 and a pamphlet on GDM as goals are tighter during pregnancy.  Discussed risks to fetus and long and short term complications for her as well.  Provided patient with a glucometer and she can get more strips at [facility] when needed.  [facility] pharmacy will deliver her insulin to her room prior to discharge.  She is aware she will need close follow up with OB/MFM.  Discussed hypoglycemia, signs, symptoms and treatment.     Will continue to follow while inpatient.  Thank you, Reche Dixon, RN, BSN Diabetes Coordinator Inpatient Diabetes Program [phone] (team pager from 8a-5p)

## 2020-09-19 NOTE — MAU Provider Note (Signed)
History     CSN: [id number]  Arrival date and time: 09/19/20 1032   Event Date/Time   First Provider Initiated Contact with Patient 09/19/20 1151      Chief Complaint  Patient presents with  . Abdominal Pain  . uncontrolled diabetes   Mercedes Valdez is a 25 y.o. G1P0 at 14w0dby Definite LMP of August 01, 2020.  She presents today for Abdominal Pain.  She states the pain started "a couple of weeks ago" and describes it as "period cramps, but also feels like a stomach ache."  Patient states pain is intermittent and rates 8/10. She states she has taken ibuprofen with some relief; last dose was Saturday.  She reports taking tylenol yesterday with no relief. Patient reports she was recently tested for STDs and vaginal infections and was diagnosed with BV and yeast.  However, patient states she is has not obtained the medication yet.  Patient's mother expresses concern with patient unable to afford insulin for Type 2 diabetes.  Patient reports she has been checking sugars randomly, but has not taken insulin for ~ 2 years.    OB History    Gravida  1   Para      Term      Preterm      AB      Living        SAB      IAB      Ectopic      Multiple      Live Births              Past Medical History:  Diagnosis Date  . ADHD (attention deficit hyperactivity disorder)   . Asthma   . Diabetes mellitus    Type 2  . Elevated blood pressure   . Obesity   . PCOS (polycystic ovarian syndrome)     Past Surgical History:  Procedure Laterality Date  . EXTERNAL EAR SURGERY     ear tubes    Family History  Problem Relation Age of Onset  . Diabetes Mother   . Vision loss Mother   . ADD / ADHD Brother   . Allergies Brother   . Asthma Brother   . Vision loss Brother   . Cancer Paternal Grandfather        Colon Cancer  . Heart disease Father 359      died from MI at age 10425   Social History   Tobacco Use  . Smoking status: Current Every Day Smoker    Packs/day:  0.20    Types: Cigarettes  . Smokeless tobacco: Never Used  Vaping Use  . Vaping Use: Never used  Substance Use Topics  . Alcohol use: Yes  . Drug use: Yes    Types: Marijuana    Allergies:  Allergies  Allergen Reactions  . Amoxicillin Hives  . Augmentin [Amoxicillin-Pot Clavulanate] Hives  . Penicillins Hives    Has patient had a PCN reaction causing immediate rash, facial/tongue/throat swelling, SOB or lightheadedness with hypotension: No Has patient had a PCN reaction causing severe rash involving mucus membranes or skin necrosis: No Has patient had a PCN reaction that required hospitalization No Has patient had a PCN reaction occurring within the last 10 years: No If all of the above answers are "NO", then may proceed with Cephalosporin use.  . Shellfish Allergy Swelling and Other (See Comments)    Itching lips  . Suprax [Cefixime]     Medications Prior to  Admission  Medication Sig Dispense Refill Last Dose  . acetaminophen (TYLENOL) 500 MG tablet Take 500 mg by mouth every 6 (six) hours as needed.   09/18/2020 at Unknown time  . azithromycin (ZITHROMAX) 250 MG tablet Take 1 tablet (250 mg total) by mouth daily. Take first 2 tablets together, then 1 every day until finished. 6 tablet 0   . blood glucose meter kit and supplies KIT Dispense based on patient and insurance preference. Use up to four times daily as directed. (FOR ICD-10 E.11.9) 1 each 0   . Doxylamine-Pyridoxine 10-10 MG TBEC Take 2 tablets by mouth at bedtime. 60 tablet 0   . ibuprofen (ADVIL) 600 MG tablet Take 1 tablet (600 mg total) by mouth every 6 (six) hours as needed. 30 tablet 0   . Insulin Pen Needle (PEN NEEDLES) 30G X 8 MM MISC 1 pen by Does not apply route daily. 100 each 3   . loratadine (CLARITIN) 10 MG tablet Take 10 mg by mouth daily.     . ondansetron (ZOFRAN ODT) 4 MG disintegrating tablet Take 1 tablet (4 mg total) by mouth every 8 (eight) hours as needed for nausea or vomiting. 10 tablet 0      Review of Systems  Constitutional: Negative for chills and fever.  Respiratory: Negative for cough and shortness of breath.   Gastrointestinal: Positive for abdominal pain, nausea (None currently) and vomiting. Negative for constipation and diarrhea.  Genitourinary: Negative for difficulty urinating, dysuria, vaginal bleeding and vaginal discharge.  Neurological: Negative for dizziness, light-headedness and headaches.   Physical Exam   Blood pressure (!) 145/83, pulse 86, temperature 98.6 F (37 C), temperature source Oral, resp. rate 17, last menstrual period 08/01/2020, SpO2 100 %.  Physical Exam Vitals and nursing note reviewed.  Constitutional:      Appearance: She is well-developed.  HENT:     Head: Normocephalic and atraumatic.  Cardiovascular:     Rate and Rhythm: Normal rate and regular rhythm.  Pulmonary:     Effort: Pulmonary effort is normal. No respiratory distress.     Breath sounds: Normal breath sounds.  Abdominal:     General: Abdomen is flat. Bowel sounds are normal.     Palpations: Abdomen is soft.     Tenderness: There is no abdominal tenderness.  Skin:    General: Skin is warm and dry.  Neurological:     Mental Status: She is alert.     MAU Course  Procedures Results for orders placed or performed during the hospital encounter of 09/19/20 (from the past 24 hour(s))  Urinalysis, Routine w reflex microscopic Urine, Clean Catch     Status: Abnormal   Collection Time: 09/19/20 11:20 AM  Result Value Ref Range   Color, Urine YELLOW YELLOW   APPearance HAZY (A) CLEAR   Specific Gravity, Urine 1.031 (H) 1.005 - 1.030   pH 6.0 5.0 - 8.0   Glucose, UA >=500 (A) NEGATIVE mg/dL   Hgb urine dipstick NEGATIVE NEGATIVE   Bilirubin Urine NEGATIVE NEGATIVE   Ketones, ur NEGATIVE NEGATIVE mg/dL   Protein, ur NEGATIVE NEGATIVE mg/dL   Nitrite NEGATIVE NEGATIVE   Leukocytes,Ua MODERATE (A) NEGATIVE   RBC / HPF 0-5 0 - 5 RBC/hpf   WBC, UA 0-5 0 - 5 WBC/hpf    Bacteria, UA NONE SEEN NONE SEEN   Squamous Epithelial / LPF 6-10 0 - 5  CBC     Status: Abnormal   Collection Time: 09/19/20 12:04 PM  Result Value  Ref Range   WBC 10.2 4.0 - 10.5 K/uL   RBC 4.40 3.87 - 5.11 MIL/uL   Hemoglobin 11.8 (L) 12.0 - 15.0 g/dL   HCT 36.3 36.0 - 46.0 %   MCV 82.5 80.0 - 100.0 fL   MCH 26.8 26.0 - 34.0 pg   MCHC 32.5 30.0 - 36.0 g/dL   RDW 13.2 11.5 - 15.5 %   Platelets 517 (H) 150 - 400 K/uL   nRBC 0.0 0.0 - 0.2 %  hCG, quantitative, pregnancy     Status: Abnormal   Collection Time: 09/19/20 12:04 PM  Result Value Ref Range   hCG, Beta Chain, Quant, S 2,358 (H) <5 mIU/mL  ABO/Rh     Status: None   Collection Time: 09/19/20 12:04 PM  Result Value Ref Range   ABO/RH(D)      A POS Performed at [facility], [place] [address], [place], [place] [address]   Hemoglobin A1c     Status: Abnormal   Collection Time: 09/19/20 12:04 PM  Result Value Ref Range   Hgb A1c MFr Bld 13.2 (H) 4.8 - 5.6 %   Mean Plasma Glucose 332.14 mg/dL  Glucose, capillary     Status: Abnormal   Collection Time: 09/19/20 12:28 PM  Result Value Ref Range   Glucose-Capillary 287 (H) 70 - 99 mg/dL   US OB LESS THAN 14 WEEKS WITH OB TRANSVAGINAL  Result Date: 09/19/2020 CLINICAL DATA:  Pelvic pain EXAM: OBSTETRIC <14 WK [place] AND TRANSVAGINAL OB US TECHNIQUE: Both transabdominal and transvaginal ultrasound examinations were performed for complete evaluation of the gestation as well as the maternal uterus, adnexal regions, and pelvic cul-de-sac. Transvaginal technique was performed to assess early pregnancy. COMPARISON:  11/24/2008 FINDINGS: Intrauterine gestational sac: Single Yolk sac:  Visualized. Embryo:  Not Visualized. Cardiac Activity: Not Visualized. MSD: 5.6 mm   5 w   2 d Subchorionic hemorrhage:  None visualized. Maternal uterus/adnexae: Unremarkable appearance of the bilateral ovaries and adnexal regions. No free fluid within the pelvis. IMPRESSION: Early intrauterine gestational  sac containing yolk sac. No fetal pole or cardiac activity is evident at this time. Recommend close clinical follow-up, short interval repeat exam, and serial beta HCG levels. Electronically Signed   By: Davina Poke D.O.   On: 09/19/2020 13:45   MDM Physical Exam Labs: UA, UPT, CBC, hCG, ABO, CBG Ultrasound Social Work Mack and Plan  25 year old  G1P0 at 7 weeks Abdominal Cramping Type 2 DM-Insulin Dependent  -POC Reviewed -Exam performed and findings discussed. -Informed that we will collect labs as above. -Discussed deferrering pelvic exam with cultures as recently performed. -Patient offered and declines pain medication reporting pain has resolved. -Will also send for [place]. -Cautioned that [place] at early gestational age frequently require follow up in the form of additional lab testing or ultrasound.  -Discussed putting in consult for SW for assistance with obtaining medications.   Maryann Conners 09/19/2020, 11:51 AM   Reassessment (1:02 PM) -Social work contacts patient and is able to provide assistance for medication. -Instructed that all medications including insulin needs to be sent to [place].  -CBG collected and return elevated at 287. Dr. Sheryn Bison consulted regarding and informed of patient status, evaluation, interventions, and results. Advised: *Obtain HgB A1C *Give 7 Units: Novolog Now *Start insulin dosing as follows: Breakfast: NPH 39U/Reg 20U, Dinner: Reg 15U, HS: NPH 15U *Send order for diabetic education to occur in 7-10 days.  -  Patient updated on POC and has no questions or concerns. -However, requests that medication for nausea be sent to pharmacy as well.  -Orders placed for insulin as advised as well as Terazol Cream, Diclegis, and metronidazole  -Order for diabetic education placed.  Reassessment (2:09 PM)  -T. Rudisil, RPH states NPH and Regular insulin is not covered under Match plan  and patient would have to pay $100 per vial for insulin.  -SW, Wallace Keller, RN, contacted and informed of costs. -Provider to bedside and patient states she can not afford said costs. -Reviewed [place] results and need for repeat US in 7-10 days  Reassessment (2:55 PM) T. Rudisil calls and able to assist provider with modifying medication to allow patient to get insulin. Advises: *Instead of NPH give Lantus 30 U daily and order Novolog as 5-10 U per meal, but should not advise dosing until at least 1-2 weeks of CBGs has been collected. *Also states these medications can be ordered as a pen which would is patient preferred dosing method.  *Informs provider that Lonn [place] is not carried at [facility] [facility].  -Orders placed per pharmacist recommendation. -Patient updated on POC and need to follow up in office with diabetic education and to keep all NOB appts. -Cautioned that once she receives insurance her medication/insulin type may be modified.  Also informed that dosing may be modified based on CBG levels. -Informed that Terazol cream is not carried at pharmacy and she would have to purchase at other pharmacy or wait until medicaid is active.  -Patient without questions or concerns. -Will plan to  discharge once medications received from pharmacy. -Encouraged to call or return to MAU if symptoms worsen or with the onset of new symptoms.   Maryann Conners MSN, CNM Advanced Practice Provider, [facility]

## 2020-09-19 NOTE — MAU Note (Signed)
Beverly Gust, the Education officer, museum. Jeanette Caprice stated she had just informed the case manager regarding the patients status and she would be coming to see the patient soon.

## 2020-09-19 NOTE — Discharge Instructions (Signed)
Blood Glucose Monitoring, Adult Monitoring your blood sugar (glucose) is an important part of managing your diabetes. Blood glucose monitoring involves checking your blood glucose as often as directed and keeping a log or record of your results over time. Checking your blood glucose regularly and keeping a blood glucose log can:  Help you and your health care provider adjust your diabetes management plan as needed, including your medicines or insulin.  Help you understand how food, exercise, illnesses, and medicines affect your blood glucose.  Let you know what your blood glucose is at any time. You can quickly find out if you have low blood glucose (hypoglycemia) or high blood glucose (hyperglycemia). Your health care provider will set individualized treatment goals for you. Your goals will be based on your age, other medical conditions you have, and how you respond to diabetes treatment. Generally, the goal of treatment is to maintain the following blood glucose levels:  Before meals (preprandial): 80-130 mg/dL (4.4-7.2 mmol/L).  After meals (postprandial): below 180 mg/dL (10 mmol/L).  A1C level: less than 7%. Supplies needed:  Blood glucose meter.  Test strips for your meter. Each meter has its own strips. You must use the strips that came with your meter.  A needle to prick your finger (lancet). Do not use a lancet more than one time.  A device that holds the lancet (lancing device).  A journal or log book to write down your results. How to check your blood glucose Checking your blood glucose 1. Wash your hands for at least 20 seconds with soap and water. 2. Prick the side of your finger (not the tip) with the lancet. Do not use the same finger consecutively. 3. Gently rub the finger until a small drop of blood appears. 4. Follow instructions that come with your meter for inserting the test strip, applying blood to the strip, and using your blood glucose meter. 5. Write down  your result and any notes in your log.   Using alternative sites Some meters allow you to use areas of your body other than your finger (alternative sites) to test your blood. The most common alternative sites are the forearm, the thigh, and the palm of your hand. Alternative sites may not be as accurate as the fingers because blood flow is slower in those areas. This means that the result you get may be delayed, and it may be different from the result that you would get from your finger. Use the finger only, and do not use alternative sites, if:  You think you have hypoglycemia.  You sometimes do not know that your blood glucose is getting low (hypoglycemia unawareness). General tips and recommendations Blood glucose log  Every time you check your blood glucose, write down your result. Also write down any notes about things that may be affecting your blood glucose, such as your diet and exercise for the day. This information can help you and your health care provider: ? Look for patterns in your blood glucose over time. ? Adjust your diabetes management plan as needed.  Check if your meter allows you to download your records to a computer or if there is an app for the meter. Most glucose meters store a record of glucose readings in the meter.   If you have type 1 diabetes:  Check your blood glucose 4 or more times a day if you are on intensive insulin therapy with multiple daily injections (MDI) or if you are using an insulin pump. Check your  blood glucose: ? Before every meal and snack. ? Before bedtime.  Also check your blood glucose: ? If you have symptoms of hypoglycemia. ? After treating low blood glucose. ? Before doing activities that create a risk for injury, like driving or using machinery. ? Before and after exercise. ? Two hours after a meal. ? Occasionally between 2:00 a.m. and 3:00 a.m., as directed.  You may need to check your blood glucose more often, 6-10 times per  day, if: ? You have diabetes that is not well controlled. ? You are ill. ? You have a history of severe hypoglycemia. ? You have hypoglycemia unawareness. If you have type 2 diabetes:  Check your blood glucose 2 or more times a day if you take insulin or other diabetes medicines.  Check your blood glucose 4 or more times a day if you are on intensive insulin therapy. Occasionally, you may also need to check your glucose between 2:00 a.m. and 3:00 a.m., as directed.  Also check your blood glucose: ? Before and after exercise. ? Before doing activities that create a risk for injury, like driving or using machinery.  You may need to check your blood glucose more often if: ? Your medicine is being adjusted. ? Your diabetes is not well controlled. ? You are ill. General tips  Make sure you always have your supplies with you.  After you use a few boxes of test strips, adjust (calibrate) your blood glucose meter by following instructions that came with your meter.  If you have questions or need help, all blood glucose meters have a 24-hour hotline phone number available that you can call. Also contact your health care provider with questions or concerns you may have. Where to find more information  The American Diabetes Association: www.diabetes.org  The Association of Diabetes Care & Education Specialists: www.diabeteseducator.org Contact a health care provider if:  Your blood glucose is at or above 240 mg/dL (13.3 mmol/L) for 2 days in a row.  You have been sick or have had a fever for 2 days or longer, and you are not getting better.  You have any of the following problems for more than 6 hours: ? You cannot eat or drink. ? You have nausea or vomiting. ? You have diarrhea. Get help right away if:  Your blood glucose is lower than 54 mg/dL (3 mmol/L).  You become confused, or you have trouble thinking clearly.  You have difficulty breathing.  You have moderate or large  ketone levels in your urine. These symptoms may represent a serious problem that is an emergency. Do not wait to see if the symptoms will go away. Get medical help right away. Call your local emergency services (911 in the [place] Do not drive yourself to the hospital. Summary  Monitoring your blood glucose is an important part of managing your diabetes.  Blood glucose monitoring involves checking your blood glucose as often as directed and keeping a log or record of your results over time.  Your health care provider will set individualized treatment goals for you. Your goals will be based on your age, other medical conditions you have, and how you respond to diabetes treatment.  Every time you check your blood glucose, write down your result. Also, write down any notes about things that may be affecting your blood glucose, such as your diet and exercise for the day. This information is not intended to replace advice given to you by your health care provider. Make sure  you discuss any questions you have with your health care provider. Document Revised: 02/08/2020 Document Reviewed: 02/08/2020 Elsevier Patient Education  2021 [employer].

## 2020-09-19 NOTE — MAU Note (Signed)
Diabetes Coordinator at bedside. °

## 2020-09-20 LAB — ABO/RH: ABO/RH(D): A POS

## 2020-10-03 ENCOUNTER — Encounter: Payer: Medicaid Other | Admitting: Registered"

## 2020-10-03 ENCOUNTER — Other Ambulatory Visit: Payer: Self-pay

## 2020-10-03 ENCOUNTER — Other Ambulatory Visit: Payer: Self-pay | Admitting: Lactation Services

## 2020-10-03 ENCOUNTER — Ambulatory Visit (INDEPENDENT_AMBULATORY_CARE_PROVIDER_SITE_OTHER): Payer: Self-pay | Admitting: Registered"

## 2020-10-03 DIAGNOSIS — O24119 Pre-existing diabetes mellitus, type 2, in pregnancy, unspecified trimester: Secondary | ICD-10-CM

## 2020-10-03 DIAGNOSIS — Z794 Long term (current) use of insulin: Secondary | ICD-10-CM | POA: Diagnosis not present

## 2020-10-03 DIAGNOSIS — E119 Type 2 diabetes mellitus without complications: Secondary | ICD-10-CM | POA: Diagnosis not present

## 2020-10-03 DIAGNOSIS — Z3A00 Weeks of gestation of pregnancy not specified: Secondary | ICD-10-CM | POA: Insufficient documentation

## 2020-10-03 MED ORDER — ACCU-CHEK GUIDE VI STRP: ORAL_STRIP | 8 refills | Status: DC

## 2020-10-03 MED ORDER — ACCU-CHEK SOFTCLIX LANCETS MISC: 8 refills | Status: DC

## 2020-10-03 NOTE — Progress Notes (Signed)
Blood glucose monitoring supplies sent to Pharmacy at request of Levie [place], RD

## 2020-10-03 NOTE — Progress Notes (Signed)
Patient was seen on 10/03/2020 for Type 2 Diabetes in pregnancy self-management. EDD 05/08/2021; [redacted]w[redacted]d.  Current Medication: Novolog 10 u tid; Lantus 30 u 9 am  Diabetes History per patient: Diagnosis age 25 (20 yrs ago). 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 Patient has not had dilated eye exam.  This visit was limited to discussing creating structure for meals, sleeping, and checking blood sugar. Will discuss food next visit when patient returns with blood sugar log.  Patient states she works 3rd shift 4:30 pm to 3:00 am.  Pt states she was told to take insulin at 9 am so in order to follow that instruction she would sleep for 4 hours after work then take insulin, eat and then go back to sleep for ~3 more hours.  Pt states because of this schedule she cannot check her fasting blood sugar. Patient has been checking blood sugar but the timing of checks was not enough information for managing medication efficiently.  RD worked with patient for idea to follow a more structured eating/sleeping/insulin routine. Pt states she is willing to try the following schedule for meals and checking blood sugar:    Patient will be seen for education follow-up in 2 weeks which will be 2 days after new OB visit with MD.

## 2020-10-08 ENCOUNTER — Telehealth (INDEPENDENT_AMBULATORY_CARE_PROVIDER_SITE_OTHER): Payer: Medicaid Other

## 2020-10-08 DIAGNOSIS — Z3A00 Weeks of gestation of pregnancy not specified: Secondary | ICD-10-CM

## 2020-10-08 DIAGNOSIS — O24119 Pre-existing diabetes mellitus, type 2, in pregnancy, unspecified trimester: Secondary | ICD-10-CM | POA: Insufficient documentation

## 2020-10-08 DIAGNOSIS — Z8619 Personal history of other infectious and parasitic diseases: Secondary | ICD-10-CM

## 2020-10-08 DIAGNOSIS — O09899 Supervision of other high risk pregnancies, unspecified trimester: Secondary | ICD-10-CM

## 2020-10-08 DIAGNOSIS — Z794 Long term (current) use of insulin: Secondary | ICD-10-CM

## 2020-10-08 DIAGNOSIS — E119 Type 2 diabetes mellitus without complications: Secondary | ICD-10-CM

## 2020-10-08 DIAGNOSIS — O0990 Supervision of high risk pregnancy, unspecified, unspecified trimester: Secondary | ICD-10-CM

## 2020-10-08 MED ORDER — GOJJI WEIGHT SCALE MISC: 1.0000 | 0 refills | Status: AC

## 2020-10-08 MED ORDER — BLOOD PRESSURE KIT DEVI: 1.0000 | 0 refills | Status: AC

## 2020-10-08 NOTE — Progress Notes (Signed)
Virtual Visit via Telephone Note  I connected with Mercedes Valdez, on 10/09/2020 at 10:22 AM by telephone due to the COVID-19 pandemic and verified that I am speaking with the correct person using two identifiers.  Due to current restrictions/limitations of in-office visits due to the COVID-19 pandemic, this scheduled clinical appointment was converted to a telehealth visit.   Consent: I discussed the limitations, risks, security and privacy concerns of performing an evaluation and management service by telephone and the availability of in person appointments. I also discussed with the patient that there may be a patient responsible charge related to this service. The patient expressed understanding and agreed to proceed.   Location of Patient: Home  Location of Provider: [facility] participating in Telemedicine visit: Deshae Dickison, NP Elmon Else, CMA   History of Present Illness: Mercedes Valdez is a 25 year-old female who presents to establish care. PMH significant for asthma, unspecified gastritis and gastroduodenitis without mention of hemorrhage, polycystic ovarian syndrome, type 2 diabetes mellitus, irregular menses, sexually transmitted infection, psychosocial stressors, and history of herpes simplex infection.  Current issues and/or concerns: She is currently pregnant. EDD 05/08/2021. She is scheduled for first prenatal visit with OB/GYN 10/11/2020.    Past Medical History:  Diagnosis Date  . ADHD (attention deficit hyperactivity disorder)   . Asthma   . Diabetes mellitus    Type 2  . Elevated blood pressure   . Obesity   . PCOS (polycystic ovarian syndrome)    Allergies  Allergen Reactions  . Amoxicillin Hives  . Augmentin [Amoxicillin-Pot Clavulanate] Hives  . Penicillins Hives    Has patient had a PCN reaction causing immediate rash, facial/tongue/throat swelling, SOB or lightheadedness with  hypotension: No Has patient had a PCN reaction causing severe rash involving mucus membranes or skin necrosis: No Has patient had a PCN reaction that required hospitalization No Has patient had a PCN reaction occurring within the last 10 years: No If all of the above answers are "NO", then may proceed with Cephalosporin use.  . Shellfish Allergy Swelling and Other (See Comments)    Itching lips  . Suprax [Cefixime]     Current Outpatient Medications on File Prior to Visit  Medication Sig Dispense Refill  . Accu-Chek Softclix Lancets lancets To check blood sugars 4 times a day. Fasting and 2 hours after breakfast, lunch and dinner. 100 each 8  . acetaminophen (TYLENOL) 500 MG tablet Take 500 mg by mouth every 6 (six) hours as needed.    . Blood Pressure Monitoring (BLOOD PRESSURE KIT) DEVI 1 Device by Does not apply route once a week. Please take blood pressure once a week and record in Babyscripts. 1 each 0  . glucose blood (ACCU-CHEK GUIDE) test strip To check blood sugars 4 times a day. Fasting, and 2 hours after Breakfast, Lunch and Dinner 100 each 8  . insulin aspart (NOVOLOG) 100 UNIT/ML FlexPen Inject 10 Units into the skin 3 (three) times daily before meals. 9 mL 1  . insulin glargine (LANTUS) 100 UNIT/ML Solostar Pen Inject 30 Units into the skin daily. 9 mL 0  . Insulin Pen Needle (PENTIPS) 32G X 4 MM MISC Use as directed 100 each 3  . loratadine (CLARITIN) 10 MG tablet Take 1 tablet (10 mg total) by mouth daily. 30 tablet 2  . Misc. Devices (GOJJI WEIGHT SCALE) MISC 1 Device by Does not apply route once a week. Please take  weight and record in Babyscripts once a week. 1 each 0  . Prenatal MV & Min w/FA-DHA (PRENATAL ADULT GUMMY/DHA/FA PO) Take by mouth.    . [DISCONTINUED] atorvastatin (LIPITOR) 20 MG tablet Take 1 tablet (20 mg total) by mouth daily. To lower cholesterol (Patient not taking: Reported on 08/29/2019) 90 tablet 1   No current facility-administered medications on file  prior to visit.    Observations/Objective: Alert and oriented x 3. Not in acute distress. Physical examination not completed as this is a telemedicine visit.  Assessment and Plan: 1. Encounter to establish care: - Patient presents today to establish care.  - Patient currently pregnant. EDD 05/08/2021.  - Return for primary care management once patient delivers and medically cleared from Obstetrics/Gynecology.  Follow Up Instructions:  Patient was given clear instructions to go to Emergency Department or return to medical center if symptoms don't improve, worsen, or new problems develop.The patient verbalized understanding.  I discussed the assessment and treatment plan with the patient. The patient was provided an opportunity to ask questions and all were answered. The patient agreed with the plan and demonstrated an understanding of the instructions.   The patient was advised to call back or seek an in-person evaluation if the symptoms worsen or if the condition fails to improve as anticipated.   I provided 5 minutes total of non-face-to-face time during this encounter.   Camillia Herter, NP  [facility] at [facility] [place], [place] 10/09/2020, 10:22 AM

## 2020-10-08 NOTE — Progress Notes (Signed)
New OB Intake  I connected with  Mercedes Valdez on 10/08/20 at 10:15 AM EDT by MyChart video and verified that I am speaking with the correct person using two identifiers. Nurse is located at [facility] and pt is located at home.  I discussed the limitations, risks, security and privacy concerns of performing an evaluation and management service by telephone and the availability of in person appointments. I also discussed with the patient that there may be a patient responsible charge related to this service. The patient expressed understanding and agreed to proceed.  I explained I am completing New OB Intake today. We discussed her EDD of 05/08/21 that is based on LMP of 08/01/20. Pt is G1/P0. I reviewed her allergies, medications, Medical/Surgical/OB history, and appropriate screenings. I informed her of [facility] services. Based on history, this pregnancy is complicated by type 2 diabetes mellitus.  Patient Active Problem List   Diagnosis Date Noted  . Psychosocial stressors 09/26/2014  . Insulin dependent type 2 diabetes mellitus (Mercedes Valdez) 09/06/2014  . Bacterial vaginosis 09/05/2014  . STI (sexually transmitted infection) 09/05/2014  . Multiple drug allergies 02/27/2014  . Musculoskeletal pain 04/08/2013  . Unspecified gastritis and gastroduodenitis without mention of hemorrhage 04/08/2013  . Type 2 diabetes mellitus (Mercedes Valdez) 04/08/2013  . Irregular menses 04/08/2013  . Dysfunctional uterine bleeding 11/09/2012  . PCOS (polycystic ovarian syndrome) 09/29/2012  . Asthma     Concerns addressed today  Pt reports headache since Saturday. States she took Tylenol 1000 mg once on Saturday with no improvement. Endorses vision changes and swelling in both legs; swelling present for last 1.5-2 weeks. Pt will be seen by PCP tomorrow for DM2 follow-up. I explained to pt there is concern for elevated BP and she should keep this appointment.   Reports history of HSV; cannot recall type 1 or 2. Explained pt will  be prescribed medication for suppression near the end of pregnancy.  Delivery Plans:  Plans to deliver at [facility].   MyChart/Babyscripts MyChart access verified. I explained pt will have some visits in office and some virtually. Babyscripts instructions given and order placed. Patient verifies receipt of registration text/e-mail. Account successfully created and app downloaded.  Blood Pressure Cuff Blood pressure cuff ordered for patient to pick-up from [employer]. Weight scale ordered. Explained after first prenatal appt pt will check weekly and document in 10.  Anatomy US Explained first scheduled [place] will be around 19 weeks. Anatomy US scheduled for 12/12/20 at 1045. Pt notified to arrive at 1030.  Labs Discussed Johnsie Cancel genetic screening with patient. Would like both Panorama and Horizon drawn at new OB visit. Routine prenatal labs needed.  Covid Vaccine Patient has not covid vaccine.   Social Determinants of Health . Food Insecurity: Patient denies food insecurity. . WIC Referral: Patient already registered with [place].  . Transportation: Patient denies transportation needs. . Childcare: Discussed no children allowed at ultrasound appointments. Offered childcare services; patient declines childcare services at this time.  First visit review I reviewed new OB appt with pt. I explained she will have a visit with provider that includes a pelvic exam with PAP smear and ob bloodwork with genetic screening. Explained pt will be seen by Clayton Lefort, MD at first visit; encounter routed to appropriate provider. Explained that patient will be seen by pregnancy navigator following visit with provider.  Mercedes Howells, RN 10/08/2020  10:08 AM

## 2020-10-09 ENCOUNTER — Encounter: Payer: Self-pay | Admitting: Family

## 2020-10-09 ENCOUNTER — Encounter (HOSPITAL_COMMUNITY): Payer: Self-pay | Admitting: Obstetrics & Gynecology

## 2020-10-09 ENCOUNTER — Other Ambulatory Visit: Payer: Self-pay

## 2020-10-09 ENCOUNTER — Inpatient Hospital Stay (HOSPITAL_COMMUNITY)
Admission: AD | Admit: 2020-10-09 | Discharge: 2020-10-09 | Disposition: A | Discharge status: Home / Self Care | Payer: Medicaid Other | Attending: Obstetrics & Gynecology | Admitting: Obstetrics & Gynecology

## 2020-10-09 ENCOUNTER — Inpatient Hospital Stay (HOSPITAL_COMMUNITY): Payer: Medicaid Other

## 2020-10-09 ENCOUNTER — Telehealth (INDEPENDENT_AMBULATORY_CARE_PROVIDER_SITE_OTHER): Payer: Medicaid Other | Admitting: Family

## 2020-10-09 DIAGNOSIS — Z3A01 Less than 8 weeks gestation of pregnancy: Secondary | ICD-10-CM

## 2020-10-09 DIAGNOSIS — Z3490 Encounter for supervision of normal pregnancy, unspecified, unspecified trimester: Secondary | ICD-10-CM

## 2020-10-09 DIAGNOSIS — O0990 Supervision of high risk pregnancy, unspecified, unspecified trimester: Secondary | ICD-10-CM

## 2020-10-09 DIAGNOSIS — Z3A09 9 weeks gestation of pregnancy: Secondary | ICD-10-CM | POA: Insufficient documentation

## 2020-10-09 DIAGNOSIS — Z87891 Personal history of nicotine dependence: Secondary | ICD-10-CM | POA: Diagnosis not present

## 2020-10-09 DIAGNOSIS — O209 Hemorrhage in early pregnancy, unspecified: Secondary | ICD-10-CM

## 2020-10-09 DIAGNOSIS — Z7689 Persons encountering health services in other specified circumstances: Secondary | ICD-10-CM | POA: Diagnosis not present

## 2020-10-09 LAB — URINALYSIS, ROUTINE W REFLEX MICROSCOPIC
Bilirubin Urine: NEGATIVE
Glucose, UA: 150 mg/dL — AB
Hgb urine dipstick: NEGATIVE
Ketones, ur: NEGATIVE mg/dL
Leukocytes,Ua: NEGATIVE
Nitrite: NEGATIVE
Protein, ur: NEGATIVE mg/dL
Specific Gravity, Urine: 1.017 (ref 1.005–1.030)
pH: 6 (ref 5.0–8.0)

## 2020-10-09 LAB — CBC
HCT: 34 % — ABNORMAL LOW (ref 36.0–46.0)
Hemoglobin: 11.2 g/dL — ABNORMAL LOW (ref 12.0–15.0)
MCH: 27.1 pg (ref 26.0–34.0)
MCHC: 32.9 g/dL (ref 30.0–36.0)
MCV: 82.1 fL (ref 80.0–100.0)
Platelets: 581 10*3/uL — ABNORMAL HIGH (ref 150–400)
RBC: 4.14 MIL/uL (ref 3.87–5.11)
RDW: 13.2 % (ref 11.5–15.5)
WBC: 8.3 10*3/uL (ref 4.0–10.5)
nRBC: 0 % (ref 0.0–0.2)

## 2020-10-09 LAB — WET PREP, GENITAL
Clue Cells Wet Prep HPF POC: NONE SEEN
Sperm: NONE SEEN
Trich, Wet Prep: NONE SEEN
Yeast Wet Prep HPF POC: NONE SEEN

## 2020-10-09 NOTE — Discharge Instructions (Signed)
Vaginal Bleeding During Pregnancy, First Trimester A small amount of bleeding from the vagina is common during early pregnancy. This kind of bleeding is also called spotting. Sometimes the bleeding is normal and does not cause problems. At other times, though, bleeding may be a sign of something serious. Normal bleeding in pregnancy can happen:  When the fertilized egg attaches itself to your womb.  When blood vessels change because of the pregnancy.  When you have pelvic exams.  When you have sex. Abnormal bleeding can happen:  When you have an infection.  When you have growths in your womb. The growths are called polyps.  If you are having a miscarriage or at risk of having one.  If you have other problems in your pregnancy. Tell your doctor right away about any bleeding from your vagina. Follow these instructions at home: Watch your bleeding  Watch your condition for any changes. Let your doctor know if you are worried about something.  Try to know what causes your bleeding. Ask yourself these questions: ? Does the bleeding start on its own? ? Does the bleeding start after something is done, such as sex or a pelvic exam?  Use a diary to write the things you see about your bleeding. Write in your diary: ? If the bleeding flows freely without stopping, or if it starts and stops, and then starts again. ? If the bleeding is heavy or light. ? How many pads you use in a day and how much blood is in them.  Tell your doctor if you pass tissue. He or she may want to see it.   Activity  Follow your doctor's instructions about how active you can be. Ask what activities are safe for you.  Do not have sex or orgasms until your doctor says that this is safe.  If needed, make plans for someone to help with your normal activities. General instructions  Take over-the-counter and prescription medicines only as told by your doctor.  Do not take aspirin because it can cause  bleeding.  Do not use tampons.  Do not douche.  Keep all follow-up visits. Contact a doctor if:  You have vaginal bleeding at any time while you are pregnant.  You have cramps.  You have a fever or chills. Get help right away if:  You have very bad cramps in your back or belly (abdomen).  You pass large clots or a lot of tissue from your vagina.  Your bleeding gets worse.  You feel light-headed.  You feel weak.  You pass out (faint).  You have chills.  You are leaking fluid from your vagina.  You have a gush of fluid from your vagina. Summary  Sometimes vaginal bleeding during pregnancy is normal and does not cause problems. At other times, bleeding may be a sign of something serious.  Tell your doctor right away about any bleeding from your vagina.  Follow your doctor's instructions about how active you can be. You may need someone to help you with your normal activities.  Keep all follow-up visits. This information is not intended to replace advice given to you by your health care provider. Make sure you discuss any questions you have with your health care provider. Document Revised: 02/02/2020 Document Reviewed: 02/02/2020 Elsevier Patient Education  2021 Elsevier Inc.  

## 2020-10-09 NOTE — MAU Note (Signed)
Mercedes Valdez is a 25 y.o. at [redacted]w[redacted]d here in MAU reporting: when she went to the bathroom she saw some red/brown discharge. States she saw it when she wiped. No pain.  Onset of complaint: today  Pain score: 0/10  Vitals:   10/09/20 1437  BP: 127/80  Pulse: 99  Resp: 16  Temp: 98.3 F (36.8 C)  SpO2: 100%     Lab orders placed from triage: UA

## 2020-10-09 NOTE — MAU Provider Note (Signed)
History     CSN: [id number]  Arrival date and time: 10/09/20 1426   Event Date/Time   First Provider Initiated Contact with Patient 10/09/20 1453      Chief Complaint  Patient presents with  . Vaginal Bleeding   HPI Mercedes Valdez is a 25 y.o. G1P0 at 61w6dby LMP who presents to MAU with chief complaint of bleeding.  This is a new problem, onset earlier today. Patient endorses seeing brown-tinged discharge when she wiped after voiding. She denies frank red bleeding. She denies pain. She denies dysuria, fever or recent illness. She is remote from sexual intercourse.  OB History    Gravida  1   Para      Term      Preterm      AB      Living        SAB      IAB      Ectopic      Multiple      Live Births              Past Medical History:  Diagnosis Date  . ADHD (attention deficit hyperactivity disorder)   . Asthma   . Diabetes mellitus    Type 2  . Elevated blood pressure   . Obesity   . PCOS (polycystic ovarian syndrome)     Past Surgical History:  Procedure Laterality Date  . EXTERNAL EAR SURGERY     ear tubes    Family History  Problem Relation Age of Onset  . Diabetes Mother   . Vision loss Mother   . ADD / ADHD Brother   . Allergies Brother   . Asthma Brother   . Vision loss Brother   . Cancer Paternal Grandfather        Colon Cancer  . Heart disease Father 359      died from MI at age 27   Social History   Tobacco Use  . Smoking status: Former Smoker    Packs/day: 0.20    Types: Cigarettes    Quit date: 08/24/2020    Years since quitting: 0.1  . Smokeless tobacco: Never Used  Vaping Use  . Vaping Use: Never used  Substance Use Topics  . Alcohol use: Not Currently  . Drug use: Not Currently    Types: Marijuana    Allergies:  Allergies  Allergen Reactions  . Amoxicillin Hives  . Augmentin [Amoxicillin-Pot Clavulanate] Hives  . Penicillins Hives    Has patient had a PCN reaction causing immediate rash,  facial/tongue/throat swelling, SOB or lightheadedness with hypotension: No Has patient had a PCN reaction causing severe rash involving mucus membranes or skin necrosis: No Has patient had a PCN reaction that required hospitalization No Has patient had a PCN reaction occurring within the last 10 years: No If all of the above answers are "NO", then may proceed with Cephalosporin use.  . Shellfish Allergy Swelling and Other (See Comments)    Itching lips  . Suprax [Cefixime]     Medications Prior to Admission  Medication Sig Dispense Refill Last Dose  . Accu-Chek Softclix Lancets lancets To check blood sugars 4 times a day. Fasting and 2 hours after breakfast, lunch and dinner. 100 each 8 10/09/2020 at Unknown time  . acetaminophen (TYLENOL) 500 MG tablet Take 500 mg by mouth every 6 (six) hours as needed.   Past Week at Unknown time  . glucose blood (ACCU-CHEK GUIDE) test strip To check  blood sugars 4 times a day. Fasting, and 2 hours after Breakfast, Lunch and Dinner 100 each 8 10/09/2020 at Unknown time  . insulin aspart (NOVOLOG) 100 UNIT/ML FlexPen Inject 10 Units into the skin 3 (three) times daily before meals. 9 mL 1 10/09/2020 at Unknown time  . insulin glargine (LANTUS) 100 UNIT/ML Solostar Pen Inject 30 Units into the skin daily. 9 mL 0 10/09/2020 at Unknown time  . Insulin Pen Needle (PENTIPS) 32G X 4 MM MISC Use as directed 100 each 3 10/09/2020 at Unknown time  . loratadine (CLARITIN) 10 MG tablet Take 1 tablet (10 mg total) by mouth daily. 30 tablet 2 10/09/2020 at Unknown time  . Misc. Devices (GOJJI WEIGHT SCALE) MISC 1 Device by Does not apply route once a week. Please take weight and record in Babyscripts once a week. 1 each 0 10/09/2020 at Unknown time  . Prenatal MV & Min w/FA-DHA (PRENATAL ADULT GUMMY/DHA/FA PO) Take by mouth.   10/09/2020 at Unknown time  . Blood Pressure Monitoring (BLOOD PRESSURE KIT) DEVI 1 Device by Does not apply route once a week. Please take blood pressure  once a week and record in Babyscripts. 1 each 0 Unknown at Unknown time    Review of Systems  Genitourinary: Positive for vaginal bleeding.  All other systems reviewed and are negative.  Physical Exam   Blood pressure 127/80, pulse 99, temperature 98.3 F (36.8 C), temperature source Oral, resp. rate 16, height _0  (1.626 m), weight 105 kg, last menstrual period 08/01/2020, SpO2 100 %.  Physical Exam Vitals and nursing note reviewed. Exam conducted with a chaperone present.  Constitutional:      Appearance: Normal appearance.  Cardiovascular:     Rate and Rhythm: Normal rate and regular rhythm.     Pulses: Normal pulses.     Heart sounds: Normal heart sounds.  Pulmonary:     Effort: Pulmonary effort is normal.     Breath sounds: Normal breath sounds.  Abdominal:     General: Abdomen is flat. Bowel sounds are normal.     Tenderness: There is no abdominal tenderness. There is no right CVA tenderness or left CVA tenderness.  Genitourinary:    Comments: Pelvic exam deferred due to description of scant to small bleeding and overall reassuring ultrasound Skin:    Capillary Refill: Capillary refill takes less than 2 seconds.  Neurological:     Mental Status: She is alert and oriented to person, place, and time.  Psychiatric:        Mood and Affect: Mood normal.        Behavior: Behavior normal.        Thought Content: Thought content normal.        Judgment: Judgment normal.    MAU Course  Procedures  --No fetal pole on imaging from 09/19/2020. Unable to confirm cardiac activity on bedside ultrasound. Formal ordered  Patient Vitals for the past 24 hrs:  BP Temp Temp src Pulse Resp SpO2 Height Weight  10/09/20 1437 127/80 98.3 F (36.8 C) Oral 99 16 100 % -- --  10/09/20 1434 -- -- -- -- -- -- _1  (1.626 m) 105 kg   Results for orders placed or performed during the hospital encounter of 10/09/20 (from the past 24 hour(s))  Wet prep, genital     Status: Abnormal    Collection Time: 10/09/20  3:02 PM  Result Value Ref Range   Yeast Wet Prep HPF POC NONE SEEN NONE SEEN  Trich, Wet Prep NONE SEEN NONE SEEN   Clue Cells Wet Prep HPF POC NONE SEEN NONE SEEN   WBC, Wet Prep HPF POC MANY (A) NONE SEEN   Sperm NONE SEEN   Urinalysis, Routine w reflex microscopic Urine, Clean Catch     Status: Abnormal   Collection Time: 10/09/20  3:07 PM  Result Value Ref Range   Color, Urine YELLOW YELLOW   APPearance CLEAR CLEAR   Specific Gravity, Urine 1.017 1.005 - 1.030   pH 6.0 5.0 - 8.0   Glucose, UA 150 (A) NEGATIVE mg/dL   Hgb urine dipstick NEGATIVE NEGATIVE   Bilirubin Urine NEGATIVE NEGATIVE   Ketones, ur NEGATIVE NEGATIVE mg/dL   Protein, ur NEGATIVE NEGATIVE mg/dL   Nitrite NEGATIVE NEGATIVE   Leukocytes,Ua NEGATIVE NEGATIVE  CBC     Status: Abnormal   Collection Time: 10/09/20  3:22 PM  Result Value Ref Range   WBC 8.3 4.0 - 10.5 K/uL   RBC 4.14 3.87 - 5.11 MIL/uL   Hemoglobin 11.2 (L) 12.0 - 15.0 g/dL   HCT 34.0 (L) 36.0 - 46.0 %   MCV 82.1 80.0 - 100.0 fL   MCH 27.1 26.0 - 34.0 pg   MCHC 32.9 30.0 - 36.0 g/dL   RDW 13.2 11.5 - 15.5 %   Platelets 581 (H) 150 - 400 K/uL   nRBC 0.0 0.0 - 0.2 %   US OB Transvaginal  Result Date: 10/09/2020 CLINICAL DATA:  Brownish red discharge, no pain. No current quantitative beta HCG. Last menstrual period estimated gestational age of [redacted] weeks 6 days. EXAM: TRANSVAGINAL OB ULTRASOUND TECHNIQUE: Transvaginal ultrasound was performed for complete evaluation of the gestation as well as the maternal uterus, adnexal regions, and pelvic cul-de-sac. COMPARISON:  September 19, 2020. FINDINGS: Intrauterine gestational sac: Single Yolk sac:  Visualized Embryo:  Visualized Cardiac Activity: Visualized Heart Rate: 65 bpm CRL: 3.4 mm mm   5 w 6 d                  [place] EDC: 06/05/2021 Subchorionic hemorrhage:  None visualized Maternal uterus/adnexae: Normal appearance of the bilateral ovaries. No free fluid. IMPRESSION: Single  intrauterine gestation at 5 weeks 6 days by crown-rump length with heart rate of 65 beats per minute which is of uncertain significance given small crown rump length. Consider close follow-up with beta HCG and with ultrasound as warranted given signs of bradycardia. Electronically Signed   By: Zetta Bills M.D.   On: 10/09/2020 17:10   Assessment and Plan  --25 y.o. G1P0 at with confirmed live IUP at [redacted]w[redacted]d --Bleeding in first trimester --FHR 65 bpm --Blood type A POS --Discharge home in stable condition  F/U: --[facility] Discussed with patient I would typically order followup UKoreain one week to monitor FHR but this can be performed in conjunction with her appointment  SDarlina Rumpf CNM 10/09/2020, 7:45 PM

## 2020-10-09 NOTE — Progress Notes (Signed)
Establish care

## 2020-10-10 ENCOUNTER — Other Ambulatory Visit: Payer: Self-pay

## 2020-10-10 ENCOUNTER — Other Ambulatory Visit (HOSPITAL_COMMUNITY): Payer: Self-pay

## 2020-10-10 LAB — GC/CHLAMYDIA PROBE AMP (~~LOC~~) NOT AT ARMC
Chlamydia: NEGATIVE
Comment: NEGATIVE
Comment: NORMAL
Neisseria Gonorrhea: NEGATIVE

## 2020-10-11 ENCOUNTER — Other Ambulatory Visit (HOSPITAL_COMMUNITY): Payer: Self-pay

## 2020-10-11 ENCOUNTER — Ambulatory Visit: Admit: 2020-10-11 | Payer: Medicaid Other

## 2020-10-11 MED ORDER — LANTUS SOLOSTAR 100 UNIT/ML ~~LOC~~ SOPN
30.0000 [IU] | PEN_INJECTOR | Freq: Every day | SUBCUTANEOUS | 0 refills | Status: DC
  Filled 2020-10-11: qty 9, 30d supply, fill #0

## 2020-10-13 ENCOUNTER — Inpatient Hospital Stay (HOSPITAL_COMMUNITY)
Admission: AD | Admit: 2020-10-13 | Discharge: 2020-10-13 | Disposition: A | Discharge status: Home / Self Care | Payer: Medicaid Other | Attending: Obstetrics and Gynecology | Admitting: Obstetrics and Gynecology

## 2020-10-13 ENCOUNTER — Encounter (HOSPITAL_COMMUNITY): Payer: Self-pay | Admitting: Obstetrics and Gynecology

## 2020-10-13 ENCOUNTER — Other Ambulatory Visit: Payer: Self-pay

## 2020-10-13 ENCOUNTER — Inpatient Hospital Stay (HOSPITAL_COMMUNITY): Payer: Medicaid Other

## 2020-10-13 DIAGNOSIS — O208 Other hemorrhage in early pregnancy: Secondary | ICD-10-CM | POA: Diagnosis not present

## 2020-10-13 DIAGNOSIS — Z87891 Personal history of nicotine dependence: Secondary | ICD-10-CM | POA: Diagnosis not present

## 2020-10-13 DIAGNOSIS — O4691 Antepartum hemorrhage, unspecified, first trimester: Secondary | ICD-10-CM | POA: Diagnosis not present

## 2020-10-13 DIAGNOSIS — O039 Complete or unspecified spontaneous abortion without complication: Secondary | ICD-10-CM | POA: Diagnosis not present

## 2020-10-13 DIAGNOSIS — Z3A01 Less than 8 weeks gestation of pregnancy: Secondary | ICD-10-CM | POA: Insufficient documentation

## 2020-10-13 DIAGNOSIS — O200 Threatened abortion: Secondary | ICD-10-CM

## 2020-10-13 DIAGNOSIS — O24111 Pre-existing diabetes mellitus, type 2, in pregnancy, first trimester: Secondary | ICD-10-CM | POA: Insufficient documentation

## 2020-10-13 DIAGNOSIS — Z794 Long term (current) use of insulin: Secondary | ICD-10-CM | POA: Insufficient documentation

## 2020-10-13 LAB — URINALYSIS, ROUTINE W REFLEX MICROSCOPIC
Bilirubin Urine: NEGATIVE
Glucose, UA: >=500 mg/dL — AB
Ketones, ur: NEGATIVE mg/dL
Nitrite: NEGATIVE
Protein, ur: NEGATIVE mg/dL
RBC / HPF: >50 RBC/hpf — ABNORMAL HIGH (ref 0–5)
Specific Gravity, Urine: 1.014 (ref 1.005–1.030)
pH: 5 (ref 5.0–8.0)

## 2020-10-13 LAB — HCG, QUANTITATIVE, PREGNANCY: hCG, Beta Chain, Quant, S: 11226 m[IU]/mL — ABNORMAL HIGH (ref <5)

## 2020-10-13 MED ORDER — CYCLOBENZAPRINE HCL 5 MG PO TABS
10.0000 mg | ORAL_TABLET | Freq: Once | ORAL | Status: AC
  Administered 2020-10-13: 10 mg via ORAL
  Filled 2020-10-13: qty 2

## 2020-10-13 MED ORDER — ACETAMINOPHEN 500 MG PO TABS
1000.0000 mg | ORAL_TABLET | Freq: Once | ORAL | Status: AC
  Administered 2020-10-13: 1000 mg via ORAL
  Filled 2020-10-13: qty 2

## 2020-10-13 NOTE — Discharge Instructions (Signed)
Threatened Miscarriage °A threatened miscarriage occurs when a woman has vaginal bleeding during the first 20 weeks of pregnancy but the pregnancy has not ended. If vaginal bleeding occurs during this time, the health care provider will do tests to make sure the woman is still pregnant. The woman's condition may be considered a threatened miscarriage if the tests show: °· That she is still pregnant. °· That the embryo or unborn baby (fetus) inside the uterus is still growing. °A threatened miscarriage does not mean your pregnancy will end, but it does increase the risk of losing your pregnancy (miscarriage). °What are the causes? °The cause of this condition is usually not known. °What increases the risk? °The following factors may make a pregnant woman more likely to have a miscarriage: °Certain medical conditions °· Conditions that affect the hormone balance in the body, such as thyroid disease or polycystic ovary syndrome. °· Diabetes. °· Autoimmune disorders. °· Infections. °· Bleeding disorders. °· Obesity. °Lifestyle factors °· Using products with tobacco or nicotine or being exposed to tobacco smoke. °· Having alcohol. °· Having large amounts of caffeine. °· Recreational drug use. °Problems with reproductive organs or structures °· Cervical insufficiency. This is when the the lowest part of the uterus (cervix) opens and thins before pregnancy is at term. °· Having a condition called Asherman syndrome, which causes scarring in the uterus or causes the uterus to be abnormal in structure. °· Fibrous growths, called fibroids, in the uterus. °· Congenital abnormalities. These problems are present at birth. °· Infection of the cervix or uterus. °Personal or medical history °· Injury (trauma). °· Having had a miscarriage before. °· Being younger than age 18 or older than age 35. °· Exposure to harmful substances in the environment. This may include radiation or heavy metals, such as lead. °· Using certain  medicines. °What are the signs or symptoms? °Symptoms of this condition include: °· Vaginal bleeding or spotting, with or without cramps or pain. °· Mild pain or cramps in your abdomen. °How is this diagnosed? °You may have tests to check whether you are still pregnant. These tests will be done if you have bleeding, with or without pain, in your abdomen before the 20th week of pregnancy. These tests include: °· Ultrasound. °· A physical exam. °· Measurement of your baby's heart rate. °· Lab tests, such as blood tests, urine tests, or swabs for infection. °You may be diagnosed with a threatened miscarriage if: °· Ultrasound testing shows that you are still pregnant. °· Your baby's heart rate is strong. °· A physical exam shows that your cervix is closed. °· Blood tests confirm that you are still pregnant.   °How is this treated? °No treatments have been shown to prevent a threatened miscarriage from going on to a complete miscarriage. However, the right home care is important. °Follow these instructions at home: °· Get plenty of rest. °· Do not have sex, douche, or put anything in your vagina, such as tampons, until your health care provider says it is okay. °· Do not smoke or use recreational drugs. °· Do not drink alcohol. °· Avoid caffeine. °· Keep all follow-up prenatal visits. This is important. °Contact a health care provider if: °· You have light vaginal bleeding or spotting while pregnant. °· You have pain or cramping in your abdomen. °· You have a fever. °Get help right away if: °· Heavy bleeding soaks through 2 large sanitary pads an hour for more than 2 hours. °· Blood clots come out of   your vagina. °· Tissue comes out of your vagina. °· You leak fluid, or you have a gush of fluid from your vagina. °· You have severe low back pain or cramps in your abdomen. °· You have a fever, chills, and severe pain in the abdomen. °Summary °· A threatened miscarriage occurs when a woman bleeds from the vagina during the  first 20 weeks of pregnancy but the pregnancy has not ended. °· The cause of a threatened miscarriage is usually not known. °· Symptoms of this condition may include vaginal bleeding and mild pain or cramps in your abdomen. °· No treatments have been shown to prevent a threatened miscarriage from going on to a complete miscarriage. °· Keep all follow-up prenatal visits. This is important. °This information is not intended to replace advice given to you by your health care provider. Make sure you discuss any questions you have with your health care provider. °Document Revised: 11/11/2019 Document Reviewed: 11/11/2019 °Elsevier Patient Education © 2021 [employer] ° °

## 2020-10-13 NOTE — MAU Provider Note (Signed)
History     CSN: [id number]  Arrival date and time: 10/13/20 1658   Event Date/Time   First Provider Initiated Contact with Patient 10/13/20 1816      Chief Complaint  Patient presents with  . Vaginal Bleeding   TANIKA BRACCO is a 25 y.o. G1P0 at 73w3dwho [facility]  She presents today for Vaginal Bleeding.  She states she was seen Tuesday May 17th for same complaint and was told to return with onset of heavier bleeding.  Patient reports today her bleeding increased and was accompanied by clots that are the size of poppy seeds. She denies sexual activity in the past 72 hours and has no issues with urination.  She reports some abdominal cramping in the lower abdomen that she rates a 4/10.  She states she has not taken anything for her pain.    OB History    Gravida  1   Para      Term      Preterm      AB      Living        SAB      IAB      Ectopic      Multiple      Live Births              Past Medical History:  Diagnosis Date  . ADHD (attention deficit hyperactivity disorder)   . Asthma   . Diabetes mellitus    Type 2  . Elevated blood pressure   . Obesity   . PCOS (polycystic ovarian syndrome)     Past Surgical History:  Procedure Laterality Date  . EXTERNAL EAR SURGERY     ear tubes    Family History  Problem Relation Age of Onset  . Diabetes Mother   . Vision loss Mother   . ADD / ADHD Brother   . Allergies Brother   . Asthma Brother   . Vision loss Brother   . Cancer Paternal Grandfather        Colon Cancer  . Heart disease Father 343      died from MI at age 27   Social History   Tobacco Use  . Smoking status: Former Smoker    Packs/day: 0.20    Types: Cigarettes    Quit date: 08/24/2020    Years since quitting: 0.1  . Smokeless tobacco: Never Used  Vaping Use  . Vaping Use: Never used  Substance Use Topics  . Alcohol use: Not Currently  . Drug use: Not Currently    Types: Marijuana    Allergies:   Allergies  Allergen Reactions  . Amoxicillin Hives  . Augmentin [Amoxicillin-Pot Clavulanate] Hives  . Penicillins Hives    Has patient had a PCN reaction causing immediate rash, facial/tongue/throat swelling, SOB or lightheadedness with hypotension: No Has patient had a PCN reaction causing severe rash involving mucus membranes or skin necrosis: No Has patient had a PCN reaction that required hospitalization No Has patient had a PCN reaction occurring within the last 10 years: No If all of the above answers are "NO", then may proceed with Cephalosporin use.  . Shellfish Allergy Swelling and Other (See Comments)    Itching lips  . Suprax [Cefixime]     Medications Prior to Admission  Medication Sig Dispense Refill Last Dose  . acetaminophen (TYLENOL) 500 MG tablet Take 500 mg by mouth every 6 (six) hours as needed.   Past  Week at Unknown time  . insulin glargine (LANTUS SOLOSTAR) 100 UNIT/ML Solostar Pen Inject 30 Units into the skin daily. 9 mL 0 10/13/2020 at Unknown time  . Insulin Pen Needle (PENTIPS) 32G X 4 MM MISC Use as directed 100 each 3 10/13/2020 at Unknown time  . loratadine (CLARITIN) 10 MG tablet Take 1 tablet (10 mg total) by mouth daily. 30 tablet 2 10/13/2020 at Unknown time  . Prenatal MV & Min w/FA-DHA (PRENATAL ADULT GUMMY/DHA/FA PO) Take by mouth.   10/13/2020 at Unknown time  . Accu-Chek Softclix Lancets lancets To check blood sugars 4 times a day. Fasting and 2 hours after breakfast, lunch and dinner. 100 each 8   . Blood Pressure Monitoring (BLOOD PRESSURE KIT) DEVI 1 Device by Does not apply route once a week. Please take blood pressure once a week and record in Babyscripts. 1 each 0   . glucose blood (ACCU-CHEK GUIDE) test strip To check blood sugars 4 times a day. Fasting, and 2 hours after Breakfast, Lunch and Dinner 100 each 8   . insulin aspart (NOVOLOG) 100 UNIT/ML FlexPen Inject 10 Units into the skin 3 (three) times daily before meals. 9 mL 1   . Misc.  Devices (GOJJI WEIGHT SCALE) MISC 1 Device by Does not apply route once a week. Please take weight and record in Babyscripts once a week. 1 each 0     Review of Systems  Respiratory: Negative for cough and shortness of breath.   Gastrointestinal: Positive for abdominal pain (Cramping-Lower Supapubic) and diarrhea (Loose and correlates with milk consumption). Negative for constipation, nausea and vomiting.  Genitourinary: Positive for vaginal bleeding. Negative for difficulty urinating, dyspareunia, dysuria and vaginal discharge.  Neurological: Positive for headaches. Negative for dizziness and light-headedness.   Physical Exam   Blood pressure (!) 143/85, pulse (!) 107, temperature 98.6 F (37 C), temperature source Oral, resp. rate 16, height 5' 4.02" (1.626 m), weight 104.8 kg, last menstrual period 08/01/2020, SpO2 100 %.  Physical Exam Vitals reviewed.  Constitutional:      Appearance: Normal appearance.  HENT:     Head: Normocephalic and atraumatic.  Eyes:     Conjunctiva/sclera: Conjunctivae normal.  Cardiovascular:     Rate and Rhythm: Normal rate and regular rhythm.  Pulmonary:     Effort: Pulmonary effort is normal. No respiratory distress.  Musculoskeletal:        General: Normal range of motion.  Skin:    General: Skin is warm and dry.  Neurological:     Mental Status: She is alert and oriented to person, place, and time.  Psychiatric:        Mood and Affect: Mood normal.        Behavior: Behavior normal.        Thought Content: Thought content normal.     MAU Course  Procedures Results for orders placed or performed during the hospital encounter of 10/13/20 (from the past 24 hour(s))  Urinalysis, Routine w reflex microscopic Urine, Clean Catch     Status: Abnormal   Collection Time: 10/13/20  5:30 PM  Result Value Ref Range   Color, Urine YELLOW YELLOW   APPearance HAZY (A) CLEAR   Specific Gravity, Urine 1.014 1.005 - 1.030   pH 5.0 5.0 - 8.0   Glucose,  UA >=500 (A) NEGATIVE mg/dL   Hgb urine dipstick LARGE (A) NEGATIVE   Bilirubin Urine NEGATIVE NEGATIVE   Ketones, ur NEGATIVE NEGATIVE mg/dL   Protein, ur NEGATIVE NEGATIVE mg/dL  Nitrite NEGATIVE NEGATIVE   Leukocytes,Ua TRACE (A) NEGATIVE   RBC / HPF >50 (H) 0 - 5 RBC/hpf   WBC, UA 0-5 0 - 5 WBC/hpf   Bacteria, UA RARE (A) NONE SEEN   Squamous Epithelial / LPF 0-5 0 - 5   US OB Transvaginal  Result Date: 10/13/2020 CLINICAL DATA:  Vaginal bleeding. EXAM: TRANSVAGINAL OB ULTRASOUND TECHNIQUE: Transvaginal ultrasound was performed for complete evaluation of the gestation as well as the maternal uterus, adnexal regions, and pelvic cul-de-sac. COMPARISON:  Oct 09, 2020. FINDINGS: Intrauterine gestational sac: Single Yolk sac:  Not Visualized. Embryo:  Visualized. Cardiac Activity: Not Visualized. CRL:   3.6 mm   6 w 0 d Subchorionic hemorrhage:  Small subchorionic hemorrhage is noted. Maternal uterus/adnexae: Ovaries are not visualized. No free fluid is noted. IMPRESSION: Probable early intrauterine gestational sac with fetal pole, but no yolk sac or fetal cardiac activity visualized currently, although fetal cardiac activity may have been present on prior exam. Recommend follow-up quantitative B-HCG levels and follow-up US in 14 days to assess viability. This recommendation follows SRU consensus guidelines: Diagnostic Criteria for Nonviable Pregnancy Early in the First Trimester. [place] [place] Med 2013; 224:8250-03. Electronically Signed   By: Marijo Conception M.D.   On: 10/13/2020 19:40    MDM Labs: UA, hCG Ultrasound Analgesic Muscle Relaxant Assessment and Plan  25 year old, G1P0  SIUP at 6.3weeks Vaginal Bleeding  -Reviewed POC with patient. -Exam performed and findings discussed.  -Informed that findings are highly suspicious for subchorionic hemorrhage. -Educated on [facility], its presentation, and what to expect including bleeding, risks for miscarriage, and resolution.  -Informed that we  will send for [place] to allow for visualization. -Informed that if [place] is of concern will collect repeat hCG.  -Patient offered and accepts pain medication. -Will give flexeril and tylenol. -Will await results and reassess.   Maryann Conners 10/13/2020, 6:16 PM   Reassessment (7:52 PM)  -[place] results return as above -Informed that fetal growth noted, but no cardiac activity. -However, informed that earlier cardiac activity at 5.5 weeks was questionable. -Will plan to repeat hcG today and perform follow up US in 10-14 days.  -Patient states she has an appt on Tuesday. -Will send message to office and assess availability for repeat US for that time. If not will plan for later. -Patient without questions or concerns. -Encouraged to call or return to MAU if symptoms worsen or with the onset of new symptoms. -Discharged to home in stable condition.  Maryann Conners MSN, CNM Advanced Practice Provider, [facility]

## 2020-10-13 NOTE — MAU Note (Signed)
Patient arrived to MAU c/o vaginal bleeding with clots.  that started Tuesday. She stated that she came to MAU that day which she was experiencing spotting. But the bleeding is heavier and she has to wear a pad.

## 2020-10-14 ENCOUNTER — Encounter (HOSPITAL_COMMUNITY): Payer: Self-pay | Admitting: Obstetrics & Gynecology

## 2020-10-14 ENCOUNTER — Inpatient Hospital Stay (HOSPITAL_COMMUNITY)
Admission: AD | Admit: 2020-10-14 | Discharge: 2020-10-14 | Disposition: A | Discharge status: Home / Self Care | Payer: Medicaid Other | Attending: Obstetrics & Gynecology | Admitting: Obstetrics & Gynecology

## 2020-10-14 ENCOUNTER — Inpatient Hospital Stay (HOSPITAL_COMMUNITY): Payer: Medicaid Other

## 2020-10-14 DIAGNOSIS — Z881 Allergy status to other antibiotic agents status: Secondary | ICD-10-CM | POA: Insufficient documentation

## 2020-10-14 DIAGNOSIS — Z833 Family history of diabetes mellitus: Secondary | ICD-10-CM | POA: Diagnosis not present

## 2020-10-14 DIAGNOSIS — Z87891 Personal history of nicotine dependence: Secondary | ICD-10-CM | POA: Insufficient documentation

## 2020-10-14 DIAGNOSIS — O209 Hemorrhage in early pregnancy, unspecified: Secondary | ICD-10-CM | POA: Diagnosis present

## 2020-10-14 DIAGNOSIS — O24111 Pre-existing diabetes mellitus, type 2, in pregnancy, first trimester: Secondary | ICD-10-CM | POA: Diagnosis not present

## 2020-10-14 DIAGNOSIS — Z794 Long term (current) use of insulin: Secondary | ICD-10-CM | POA: Diagnosis not present

## 2020-10-14 DIAGNOSIS — Z880 Allergy status to penicillin: Secondary | ICD-10-CM | POA: Insufficient documentation

## 2020-10-14 DIAGNOSIS — O039 Complete or unspecified spontaneous abortion without complication: Secondary | ICD-10-CM

## 2020-10-14 DIAGNOSIS — Z3A01 Less than 8 weeks gestation of pregnancy: Secondary | ICD-10-CM

## 2020-10-14 MED ORDER — MISOPROSTOL 200 MCG PO TABS: 800.0000 ug | ORAL_TABLET | Freq: Once | ORAL | 0 refills | Status: DC

## 2020-10-14 NOTE — Discharge Instructions (Signed)
Managing Pregnancy Loss Pregnancy loss can happen any time during a pregnancy. Often the cause is not known. It is rarely because of anything you did. Pregnancy loss in early pregnancy (during the first trimester) is called a miscarriage. This type of pregnancy loss is the most common. Pregnancy loss that happens after 20 weeks of pregnancy is called fetal demise if the baby's heart stops beating before birth. Fetal demise is much less common. Some women experience spontaneous labor shortly after fetal demise resulting in a stillborn birth (stillbirth). Any pregnancy loss can be devastating. You will need to recover both physically and emotionally. Most women are able to get pregnant again after a pregnancy loss and deliver a healthy baby. How to manage emotional recovery Pregnancy loss is very hard emotionally. You may feel many different emotions while you grieve. You may feel sad and angry. You may also feel guilty. It is normal to have periods of crying. Emotional recovery can take longer than physical recovery. It is different for everyone. Taking these steps can help you in managing this loss:  Remember that it is unlikely you did anything to cause the pregnancy loss.  Share your thoughts and feelings with friends, family, and your partner. Remember that your partner is also recovering emotionally.  Make sure you have a good support system. Do not spend too much time alone.  Meet with a pregnancy loss counselor or join a pregnancy loss support group.  Get enough sleep and eat a healthy diet. Return to regular exercise when you have recovered physically.  Do not use drugs or alcohol to manage your emotions.  Consider seeing a mental health professional to help you recover emotionally.  Ask a friend or loved one to help you decide what to do with any clothing and nursery items you received for your baby. In the case of a stillbirth, many women benefit from taking additional steps in the  grieving process. You may want to:  Hold your baby after the birth.  Name your baby.  Request a birth certificate.  Create a keepsake such as handprints or footprints.  Dress your baby and have a picture taken.  Make funeral arrangements.  Ask for a baptism or blessing. Hospitals have staff members who can help you with all these arrangements.   How to recognize emotional stress It is normal to have emotional stress after a pregnancy loss. But emotional stress that lasts a long time or becomes severe requires treatment. Watch out for these signs of severe emotional stress:  Sadness, anger, or guilt that is not going away and is interfering with your normal activities.  Relationship problems that have occurred or gotten worse since the pregnancy loss.  Signs of depression that last longer than 2 weeks. These may include: ? Sadness. ? Anxiety. ? Hopelessness. ? Loss of interest in activities you enjoy. ? Inability to concentrate. ? Trouble sleeping or sleeping too much. ? Loss of appetite or overeating. ? Thoughts of death or of hurting yourself. Follow these instructions at home:  Take over-the-counter and prescription medicines only as told by your health care provider.  Rest at home until your energy level returns. Return to your normal activities as told by your health care provider. Ask your health care provider what activities are safe for you.  When you are ready, meet with your health care provider to discuss steps to take for a future pregnancy.  Keep all follow-up visits as told by your health care provider. This is  important. Where to find support  To help you and your partner with the process of grieving, talk with your health care provider or seek counseling.  Consider meeting with others who have experienced pregnancy loss. Ask your health care provider about support groups and resources. Where to find more information  U.S. Department of Health and Holiday representative on Women's Health: [place]Signs.tn  American Pregnancy Association: www.americanpregnancy.org Contact a health care provider if:  You continue to experience grief, sadness, or lack of motivation for everyday activities, and those feelings do not improve over time.  You are struggling to recover emotionally, especially if you are using alcohol or substances to help. Get help right away if:  You have thoughts of hurting yourself or others. If you ever feel like you may hurt yourself or others, or have thoughts about taking your own life, get help right away. You can go to your nearest emergency department or call:  Your local emergency services (911 in the [place]  A suicide crisis helpline, such as the [place] at 907-786-4235. This is open 24 hours a day. Summary  Any pregnancy loss can be difficult physically and emotionally.  You may experience many different emotions while you grieve. Emotional recovery can last longer than physical recovery.  It is normal to have emotional stress after a pregnancy loss. But emotional stress that lasts a long time or becomes severe requires treatment.  See your health care provider if you are struggling emotionally after a pregnancy loss. This information is not intended to replace advice given to you by your health care provider. Make sure you discuss any questions you have with your health care provider. Document Revised: 09/01/2018 Document Reviewed: 07/23/2017 Elsevier Patient Education  2021 [place]. Miscarriage A miscarriage is the loss of pregnancy before the 20th week. Most miscarriages happen during the first 3 months of pregnancy. Sometimes, a miscarriage can happen before a woman knows that she is pregnant. Having a miscarriage can be an emotional experience. If you have had a miscarriage, talk with your health care provider about any questions you may have about the loss of your  baby, the grieving process, and your plans for future pregnancy. What are the causes? Many times, the cause of a miscarriage is not known. What increases the risk? The following factors may make a pregnant woman more likely to have a miscarriage: Certain medical conditions  Conditions that affect the hormone balance in the body, such as thyroid disease or polycystic ovary syndrome.  Diabetes.  Autoimmune disorders.  Infections.  Bleeding disorders.  Obesity. Lifestyle factors  Using products with tobacco or nicotine in them or being exposed to tobacco smoke.  Having alcohol.  Having large amounts of caffeine.  Recreational drug use. Problems with reproductive organs or structures  Cervical insufficiency. This is when the lowest part of the uterus (cervix) opens and thins before pregnancy is at term.  Having a condition called Asherman syndrome. This syndrome causes scarring in the uterus or causes the uterus to be abnormal in structure.  Fibrous growths, called fibroids, in the uterus.  Congenital abnormalities. These problems are present at birth.  Infection of the cervix or uterus. Personal or medical history  Injury (trauma).  Having had a miscarriage before.  Being younger than age 40 or older than age 23.  Exposure to harmful substances in the environment. This may include radiation or heavy metals, such as lead.  Use of certain medicines. What are the signs  or symptoms? Symptoms of this condition include:  Vaginal bleeding or spotting, with or without cramps or pain.  Pain or cramping in the abdomen or lower back.  Fluid or tissue coming out of the vagina. How is this diagnosed? This condition may be diagnosed based on:  A physical exam.  Ultrasound.  Lab tests, such as blood tests, urine tests, or swabs for infection. How is this treated? Treatment for a miscarriage is sometimes not needed if all the pregnancy tissue that was in the uterus  comes out on its own, and there are no other problems such as infection or heavy bleeding. In other cases, this condition may be treated with:  Dilation and curettage (D&C). In this procedure, the cervix is stretched open and any remaining pregnancy tissue is removed from the lining of the uterus (endometrium).  Medicines. These may include: ? Antibiotic medicine, to treat infection. ? Medicine to help any remaining pregnancy tissue come out of the body. ? Medicine to reduce (contract) the size of the uterus. These medicines may be given if there is a lot of bleeding. If you have Rh-negative blood, you may be given an injection of a medicine called Rho(D) immune globulin. This medicine helps prevent problems with future pregnancies. Follow these instructions at home: Medicines  Take over-the-counter and prescription medicines only as told by your health care provider.  If you were prescribed antibiotic medicine, take it as told by your health care provider. Do not stop taking the antibiotic even if you start to feel better. Activity  Rest as told by your health care provider. Ask your health care provider what activities are safe for you.  Have someone help with home and family responsibilities during this time. General instructions  Monitor how much tissue or blood clot material comes out of the vagina.  Do not have sex, douche, or put anything, such as tampons, in your vagina until your health care provider says it is okay.  To help you and your partner with the grieving process, talk with your health care provider or get counseling.  When you are ready, meet with your health care provider to discuss any important steps you should take for your health. Also, discuss steps you should take to have a healthy pregnancy in the future.  Keep all follow-up visits. This is important.   Where to find more information  The [employer] of Obstetricians and Gynecologists:  acog.org  U.S. Department of Health and Programmer, systems of Women's Health: EverydayCosmetics.no Contact a health care provider if:  You have a fever or chills.  There is bad-smelling fluid coming from the vagina.  You have more bleeding instead of less.  Tissue or blood clots come out of your vagina. Get help right away if:  You have severe cramps or pain in your back or abdomen.  Heavy bleeding soaks through 2 large sanitary pads an hour for more than 2 hours.  You become light-headed or weak.  You faint.  You feel sad, and your sadness takes over your thoughts.  You think about hurting yourself. If you ever feel like you may hurt yourself or others, or have thoughts about taking your own life, get help right away. Go to your nearest emergency department or:  Call your local emergency services (911 in the [place]  Call a suicide crisis helpline, such as the [facility] at 432-442-8589. This is open 24 hours a day in the [place]  Text the Crisis Text  Line at (458) 095-9540 (in the [place] Summary  Most miscarriages happen in the first 3 months of pregnancy. Sometimes miscarriage happens before a woman knows that she is pregnant.  Follow instructions from your health care provider about medicines and activity.  To help you and your partner with grieving, talk with your health care provider or get counseling.  Keep all follow-up visits. This information is not intended to replace advice given to you by your health care provider. Make sure you discuss any questions you have with your health care provider. Document Revised: 11/11/2019 Document Reviewed: 11/11/2019 Elsevier Patient Education  [place].

## 2020-10-14 NOTE — MAU Note (Signed)
Pt was seen in MAU last night for vag bleeding and cramping. States awoke about an hour ago with heavier vag bleeding and abd cramping. Has used one pad in past hour. Pt brought in ?clot/tissue

## 2020-10-14 NOTE — MAU Provider Note (Addendum)
History     CSN: [id number]  Arrival date and time: 10/14/20 0600   Event Date/Time   First Provider Initiated Contact with Patient 10/14/20 [phone]      Chief Complaint  Patient presents with  . Vaginal Bleeding  . Abdominal Pain   Provider to bedside and patient with SO.  Both are tearful. Patient states she passed the pregnancy.  Suspected POC noted in specimen container and examined by provider.  Condolences given. Patient informed that she will be sent to [place] to confirm.  Patient instructed to contact provider when she is ready to go. Tissues given.    OB History    Gravida  1   Para      Term      Preterm      AB      Living        SAB      IAB      Ectopic      Multiple      Live Births              Past Medical History:  Diagnosis Date  . ADHD (attention deficit hyperactivity disorder)   . Asthma   . Diabetes mellitus    Type 2  . Elevated blood pressure   . Obesity   . PCOS (polycystic ovarian syndrome)     Past Surgical History:  Procedure Laterality Date  . EXTERNAL EAR SURGERY     ear tubes    Family History  Problem Relation Age of Onset  . Diabetes Mother   . Vision loss Mother   . ADD / ADHD Brother   . Allergies Brother   . Asthma Brother   . Vision loss Brother   . Cancer Paternal Grandfather        Colon Cancer  . Heart disease Father 52       died from MI at age 59    Social History   Tobacco Use  . Smoking status: Former Smoker    Packs/day: 0.20    Types: Cigarettes    Quit date: 08/24/2020    Years since quitting: 0.1  . Smokeless tobacco: Never Used  Vaping Use  . Vaping Use: Never used  Substance Use Topics  . Alcohol use: Not Currently  . Drug use: Not Currently    Types: Marijuana    Allergies:  Allergies  Allergen Reactions  . Amoxicillin Hives  . Augmentin [Amoxicillin-Pot Clavulanate] Hives  . Penicillins Hives    Has patient had a PCN reaction causing immediate rash, facial/tongue/throat  swelling, SOB or lightheadedness with hypotension: No Has patient had a PCN reaction causing severe rash involving mucus membranes or skin necrosis: No Has patient had a PCN reaction that required hospitalization No Has patient had a PCN reaction occurring within the last 10 years: No If all of the above answers are "NO", then may proceed with Cephalosporin use.  . Shellfish Allergy Swelling and Other (See Comments)    Itching lips  . Suprax [Cefixime]     Medications Prior to Admission  Medication Sig Dispense Refill Last Dose  . acetaminophen (TYLENOL) 500 MG tablet Take 500 mg by mouth every 6 (six) hours as needed.   10/13/2020 at Unknown time  . insulin aspart (NOVOLOG) 100 UNIT/ML FlexPen Inject 10 Units into the skin 3 (three) times daily before meals. 9 mL 1 10/13/2020 at Unknown time  . loratadine (CLARITIN) 10 MG tablet Take 1 tablet (10 mg total) by  mouth daily. 30 tablet 2 10/13/2020 at Unknown time  . Prenatal MV & Min w/FA-DHA (PRENATAL ADULT GUMMY/DHA/FA PO) Take by mouth.   10/13/2020 at Unknown time  . Accu-Chek Softclix Lancets lancets To check blood sugars 4 times a day. Fasting and 2 hours after breakfast, lunch and dinner. 100 each 8   . Blood Pressure Monitoring (BLOOD PRESSURE KIT) DEVI 1 Device by Does not apply route once a week. Please take blood pressure once a week and record in Babyscripts. 1 each 0   . glucose blood (ACCU-CHEK GUIDE) test strip To check blood sugars 4 times a day. Fasting, and 2 hours after Breakfast, Lunch and Dinner 100 each 8   . insulin glargine (LANTUS SOLOSTAR) 100 UNIT/ML Solostar Pen Inject 30 Units into the skin daily. 9 mL 0   . Insulin Pen Needle (PENTIPS) 32G X 4 MM MISC Use as directed 100 each 3   . Misc. Devices (GOJJI WEIGHT SCALE) MISC 1 Device by Does not apply route once a week. Please take weight and record in Babyscripts once a week. 1 each 0     Review of Systems  Constitutional: Negative.   Gastrointestinal: Positive for  abdominal pain.  Genitourinary: Positive for vaginal bleeding.   Physical Exam   Blood pressure (!) 145/93, pulse 100, height 5' 4" (1.626 m), weight 104.7 kg, last menstrual period 08/01/2020.  Physical Exam Constitutional:      General: She is in acute distress.     Appearance: She is well-developed.  HENT:     Head: Normocephalic and atraumatic.  Eyes:     Conjunctiva/sclera: Conjunctivae normal.  Cardiovascular:     Rate and Rhythm: Normal rate.  Pulmonary:     Effort: Pulmonary effort is normal.  Musculoskeletal:     Cervical back: Normal range of motion.  Skin:    General: Skin is warm and dry.  Neurological:     Mental Status: She is alert and oriented to person, place, and time.  Psychiatric:        Mood and Affect: Mood normal.        Behavior: Behavior normal.        Thought Content: Thought content normal.     MAU Course  Procedures Results for orders placed or performed during the hospital encounter of 10/13/20 (from the past 24 hour(s))  Urinalysis, Routine w reflex microscopic Urine, Clean Catch     Status: Abnormal   Collection Time: 10/13/20  5:30 PM  Result Value Ref Range   Color, Urine YELLOW YELLOW   APPearance HAZY (A) CLEAR   Specific Gravity, Urine 1.014 1.005 - 1.030   pH 5.0 5.0 - 8.0   Glucose, UA >=500 (A) NEGATIVE mg/dL   Hgb urine dipstick LARGE (A) NEGATIVE   Bilirubin Urine NEGATIVE NEGATIVE   Ketones, ur NEGATIVE NEGATIVE mg/dL   Protein, ur NEGATIVE NEGATIVE mg/dL   Nitrite NEGATIVE NEGATIVE   Leukocytes,Ua TRACE (A) NEGATIVE   RBC / HPF >50 (H) 0 - 5 RBC/hpf   WBC, UA 0-5 0 - 5 WBC/hpf   Bacteria, UA RARE (A) NONE SEEN   Squamous Epithelial / LPF 0-5 0 - 5  hCG, quantitative, pregnancy     Status: Abnormal   Collection Time: 10/13/20  8:02 PM  Result Value Ref Range   hCG, Beta Chain, Quant, S 11,226 (H) <5 mIU/mL    Ultrasound: CLINICAL DATA: 25 year old female with bleeding in the 1st trimester of pregnancy. Quantitative  beta HCG yesterday 11,226.  Estimated gestational age by LMP 10 weeks and 4 days. Probable early IUP but no fetal pole or yolk sac on ultrasound yesterday.  EXAM: TRANSVAGINAL OB ULTRASOUND  TECHNIQUE: Transvaginal ultrasound was performed for complete evaluation of the gestation as well as the maternal uterus, adnexal regions, and pelvic cul-de-sac.  COMPARISON: 10/13/2020 and earlier.  FINDINGS: Intrauterine gestational sac: No longer identified. Questionable collapsed, abnormal gestational sac on image 2, 16. The endometrium is thick and heterogeneous (image 30).  Maternal uterus/adnexae: No free fluid identified. Neither ovary is delineated.  IMPRESSION: 1. Failed IUP, with loss of the normal intrauterine gestational sac demonstrated yesterday. Possible collapsed sac in the canal along with abundant endometrial thickening and heterogeneity. 2. No free fluid.   Electronically Signed By: Genevie Ann M.D. On: 10/14/2020 08:00  MDM   Assessment and Plan  25 year old G1P0 at 6.4 weeks SAB  -Will send for [place] and await results.   Jessica L Emly 10/14/2020, 0800 AM   Reassessment (8:19 AM) Report given to E. Purcell Nails, NP who assumes care.   Ultrasound confirms complete miscarriage. Per imaging, endometrial thickness is 2.4 cm.  Offered patient cytotec per conversation with Dr. Rip Harbour. Patient agreeable with plan.   RH positive  Stable for disharge    A/P 1. Miscarriage   2. [redacted] weeks gestation of pregnancy    -Rx cytotec -reviewed bleeding precautions & reasons to return to MAU -pelvic rest -work note provided -message to office for SAB f/u appointment  Jorje Guild, NP

## 2020-10-16 ENCOUNTER — Encounter: Payer: Self-pay | Admitting: Family Medicine

## 2020-10-16 LAB — SURGICAL PATHOLOGY

## 2020-10-18 ENCOUNTER — Encounter: Payer: Medicaid Other | Admitting: Registered"

## 2020-10-18 ENCOUNTER — Ambulatory Visit: Payer: Medicaid Other | Admitting: Registered"

## 2020-10-18 ENCOUNTER — Other Ambulatory Visit: Payer: Self-pay

## 2020-10-18 DIAGNOSIS — Z3A00 Weeks of gestation of pregnancy not specified: Secondary | ICD-10-CM | POA: Insufficient documentation

## 2020-10-18 DIAGNOSIS — E119 Type 2 diabetes mellitus without complications: Secondary | ICD-10-CM

## 2020-10-18 DIAGNOSIS — Z794 Long term (current) use of insulin: Secondary | ICD-10-CM | POA: Diagnosis not present

## 2020-10-18 DIAGNOSIS — O24111 Pre-existing diabetes mellitus, type 2, in pregnancy, first trimester: Secondary | ICD-10-CM | POA: Diagnosis present

## 2020-10-18 MED ORDER — INSULIN ASPART 100 UNIT/ML FLEXPEN
10.0000 [IU] | PEN_INJECTOR | Freq: Three times a day (TID) | SUBCUTANEOUS | 1 refills | Status: DC
Start: 2020-10-18 — End: 2021-02-25

## 2020-10-18 MED ORDER — LANTUS SOLOSTAR 100 UNIT/ML ~~LOC~~ SOPN: 30.0000 [IU] | PEN_INJECTOR | Freq: Every day | SUBCUTANEOUS | 1 refills | Status: DC

## 2020-10-18 NOTE — Progress Notes (Signed)
Patient was seen on 10/18/20 for follow-up assessment and education   Patient is s/p miscarriage on 10/14/20. Patient is using her last pens of Lantus and Novolog and would like refills. Patient states she would like to work on meal planning today.  Patient started checking blood sugar again on 5/24 Date FBS B L D 5/24 169 256 199 321 5/25 197 299 288 223 5/26 206  Patient states she had her initial appointment with [facility] on 10/09/20. Pt diabetes management will be through that office moving forward. Pt may continue to work with RD on lifestyle management of diabetes through [facility] Diabetes Education.  Dietary recall: pt does not eat meat daily - goal of eating a plant-based diet B: Cereal (a little milk, doesn't do well with milk) L: fast food [employer] - vegetarian chalupa, mtn dew zero D: pasta, water  The following learning objectives reviewed during follow-up visit:   Stress (physical & psychological) effect on blood sugar  Changes to blood sugar target ranges/goals  Balanced meals  How to use carb counting resource  A1c goal of less than 7% for Type 2 Diabetes  Plan:  Marland Kitchen Aim to eat balanced meals and snacks . Continue to avoid sugar sweetened beverages . Prepare more meals at home and rely less on fast food  Glucometer provided. Patient has strips and lancets Accu-chek Guide Me Lot #[id number] Exp: 09/28/2021  Patient instructed to monitor glucose levels: FBS: 80 - 130 mg/dl 2 hour: <180 mg/dl  Patient received the following handouts:  Planning Healthy meals  Carb counting and Meal Planning  A1c chart  Patient will be seen for follow-up as needed.

## 2020-10-24 ENCOUNTER — Other Ambulatory Visit: Payer: Self-pay

## 2020-11-02 ENCOUNTER — Other Ambulatory Visit: Payer: Self-pay

## 2020-11-02 ENCOUNTER — Ambulatory Visit (INDEPENDENT_AMBULATORY_CARE_PROVIDER_SITE_OTHER): Payer: Medicaid Other | Admitting: Obstetrics and Gynecology

## 2020-11-02 ENCOUNTER — Encounter: Payer: Self-pay | Admitting: Obstetrics and Gynecology

## 2020-11-02 VITALS — BP 146/84 | HR 87 | Wt 241.9 lb

## 2020-11-02 DIAGNOSIS — R140 Abdominal distension (gaseous): Secondary | ICD-10-CM | POA: Diagnosis not present

## 2020-11-02 DIAGNOSIS — O020 Blighted ovum and nonhydatidiform mole: Secondary | ICD-10-CM | POA: Insufficient documentation

## 2020-11-02 DIAGNOSIS — I10 Essential (primary) hypertension: Secondary | ICD-10-CM | POA: Diagnosis not present

## 2020-11-02 DIAGNOSIS — Z3042 Encounter for surveillance of injectable contraceptive: Secondary | ICD-10-CM | POA: Diagnosis not present

## 2020-11-02 DIAGNOSIS — E1165 Type 2 diabetes mellitus with hyperglycemia: Secondary | ICD-10-CM | POA: Diagnosis not present

## 2020-11-02 DIAGNOSIS — Z794 Long term (current) use of insulin: Secondary | ICD-10-CM

## 2020-11-02 HISTORY — DX: Blighted ovum and nonhydatidiform mole: O02.0

## 2020-11-02 LAB — BETA HCG QUANT (REF LAB): hCG Quant: 1 m[IU]/mL

## 2020-11-02 MED ORDER — MEDROXYPROGESTERONE ACETATE 150 MG/ML IM SUSP
150.0000 mg | Freq: Once | INTRAMUSCULAR | Status: AC
  Administered 2020-11-02: 150 mg via INTRAMUSCULAR

## 2020-11-02 NOTE — Progress Notes (Signed)
U/S scheduled at [facility] on 06/15 @ 9:30a.

## 2020-11-02 NOTE — Progress Notes (Signed)
Obstetrics and Gynecology Visit Return Patient Evaluation  Appointment Date: 11/02/2020  Primary Care Provider: [facility]: [facility] for [facility]  Chief Complaint: follow up miscarriage  History of Present Illness:  Mercedes Valdez is a 25 y.o. G1P0010 with above CC. PMHx significant for BMI 40s, HTN, poorly controlled DM2 (4/27: a1c 13.2)  Patient went to MAU on 5/22 due to concern for miscarrige and passing material at home. Prior to this she had an IUP at 5/6wks with a HR of 65 on 5/17.  POCs examined and showed hydropic villi with polar trophoblastic hyperplasia; molar pregnancy not excluded. U/s done in MAU post passing those POCs showed possibled collapsed sac in the canal with abundant endometrial thickening and heterogenity and she was given cytotec which she states she took that day or the next  She states she bled some after that, then stopped and then bled a little more but it has stopped; pt unsure of when she bled last.   Interval History: Since that time, she states that she's had bloating and weight gain and legs look swollen.  Review of Systems: as noted in the History of Present Illness.   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   History of herpes simplex infection 10/08/2020   Type 2 diabetes mellitus during pregnancy, antepartum 10/08/2020   Psychosocial stressors 09/26/2014   Insulin dependent type 2 diabetes mellitus ([place]) 09/06/2014   Bacterial vaginosis 09/05/2014   STI (sexually transmitted infection) 09/05/2014   Multiple drug allergies 02/27/2014   Musculoskeletal pain 04/08/2013   Unspecified gastritis and gastroduodenitis without mention of hemorrhage 04/08/2013   Type 2 diabetes mellitus ([place]) 04/08/2013   Irregular menses 04/08/2013   Dysfunctional uterine bleeding 11/09/2012   PCOS (polycystic ovarian syndrome) 09/29/2012   Asthma     Medications:  Chamara Dyck. Nyquist had no medications administered during this visit. Current Outpatient Medications  Medication Sig Dispense Refill   Accu-Chek Softclix Lancets lancets To check blood sugars 4 times a day. Fasting and 2 hours after breakfast, lunch and dinner. 100 each 8   Blood Pressure Monitoring (BLOOD PRESSURE KIT) DEVI 1 Device by Does not apply route once a week. Please take blood pressure once a week and record in Babyscripts. 1 each 0   glucose blood (ACCU-CHEK GUIDE) test strip To check blood sugars 4 times a day. Fasting, and 2 hours after Breakfast, Lunch and Dinner 100 each 8   insulin aspart (NOVOLOG) 100 UNIT/ML FlexPen Inject 10 Units into the skin 3 (three) times daily before meals. 9 mL 1   insulin glargine (LANTUS SOLOSTAR) 100 UNIT/ML Solostar Pen Inject 30 Units into the skin daily. 9 mL 1   Insulin Pen Needle (PENTIPS) 32G X 4 MM MISC Use as directed 100 each 3   loratadine (CLARITIN) 10 MG tablet Take 1 tablet (10 mg total) by mouth daily. 30 tablet 2   Misc. Devices (GOJJI WEIGHT SCALE) MISC 1 Device by Does not apply route once a week. Please take weight and record in Babyscripts once a week. 1 each 0   Prenatal MV & Min w/FA-DHA (PRENATAL ADULT GUMMY/DHA/FA PO) Take by mouth.     acetaminophen (TYLENOL) 500 MG tablet Take 500 mg by mouth every 6 (six) hours as needed. (Patient not taking: Reported on 11/02/2020)     No current facility-administered medications for this visit.    Allergies: is allergic to amoxicillin, augmentin [  amoxicillin-pot clavulanate], penicillins, shellfish allergy, and suprax [cefixime].  Physical Exam:  BP (!) 146/84   Pulse 87   Wt 241 lb 14.4 oz (109.7 kg)   LMP 08/01/2020 (Exact Date)   Breastfeeding Unknown   BMI 41.52 kg/m  Body mass index is 41.52 kg/m. General appearance: Well nourished, well developed female in no acute distress.  Abdomen: obese, soft, nttp, +bloating, +BS Ext: normal b/l LEs Neuro/Psych:   Normal mood and affect.     Labs: 5/21: 16,109 4/27: 2,358  Radiology: Narrative & Impression  CLINICAL DATA:  25 year old female with bleeding in the 1st trimester of pregnancy. Quantitative beta HCG yesterday 11,226. Estimated gestational age by LMP 10 weeks and 4 days. Probable early IUP but no fetal pole or yolk sac on ultrasound yesterday.   EXAM: TRANSVAGINAL OB ULTRASOUND   TECHNIQUE: Transvaginal ultrasound was performed for complete evaluation of the gestation as well as the maternal uterus, adnexal regions, and pelvic cul-de-sac.   COMPARISON:  10/13/2020 and earlier.   FINDINGS: Intrauterine gestational sac: No longer identified. Questionable collapsed, abnormal gestational sac on image 2, 16. The endometrium is thick and heterogeneous (image 30).   Maternal uterus/adnexae: No free fluid identified. Neither ovary is delineated.   IMPRESSION: 1. Failed IUP, with loss of the normal intrauterine gestational sac demonstrated yesterday. Possible collapsed sac in the canal along with abundant endometrial thickening and heterogeneity. 2. No free fluid.     Electronically Signed   By: Genevie Ann M.D.   On: 10/14/2020 08:00    Assessment: pt stable  Plan:  1. Molar pregnancy I told her I would treat it as a molar pregnancy, just to be safe and that she needs weekly betas with first one today and to follow it out for at least 3 months. I recommended birth control and options d/w her and she is amenable to depo which got today.  Will get u/s for next week to make sure everything has passed - Beta hCG quant (ref lab); Standing - US PELVIS TRANSVAGINAL NON-OB (TV ONLY); Future  2. Type 2 diabetes mellitus with hyperglycemia, with long-term current use of insulin ([place]) I told her to contact her PCP and let them know that she is not pregnant any longer in case better medications are available for her for this and her HTN.   3. Hypertension, unspecified type  4.  Abdominal bloating, weight gain 241 lbs today vs 230 on 5/22   RTC: 1wk for lab only visit and next week for u/s  Patient care time was 35 minutes with greater than 50 percent involved in counseling and discussion with the patient and mother  Durene Romans MD Attending Center for [employer])

## 2020-11-02 NOTE — Progress Notes (Signed)
Pts first Depo Injection @ 11:14 a had an expiration date of 04/22,NDC # [id number], LOT# [id number], Safety Zone Completed on 11/02/20.

## 2020-11-05 NOTE — Progress Notes (Signed)
[facility] Note  11/09/2020     CHIEF COMPLAINT Patient presents for Retina Evaluation   HISTORY OF PRESENT ILLNESS: Mercedes Valdez is a 25 y.o. female who presents to the clinic today for:   HPI     Retina Evaluation   In both eyes.  This started 1 month ago.  Duration of 1 month.  Associated Symptoms Floaters.  Negative for Flashes, Distortion, Blind Spot, Pain, Redness, Photophobia, Glare, Trauma, Scalp Tenderness, Jaw Claudication, Shoulder/Hip pain, Fever, Weight Loss and Fatigue.  Context:  distance vision, mid-range vision and near vision.  Treatments tried include no treatments.  I, the attending physician,  performed the HPI with the patient and updated documentation appropriately.        Comments   25 y/o female pt referred by Dr. Anthony Sar about 1.5 wks ago for eval of decreased VA OU (worse OD) associated with DM.  Decrease in [place] was sudden, and has remained poor since.  At the time this happened, pt was put back on insulin after being off it for 2 yrs, due to something related to pregnancy.  Pt miscarried.  Denies pain, FOL, but has some floaters OD that appeared when her vision decreased.  BS early this a.m. was 113.  A1C 1.5 mos ago was 13.2.  No gtts.      Last edited by Bernarda Caffey, MD on 11/09/2020 12:04 PM.     Patient has a history of uncontrolled diabetes (type I), diagnosed at 25 years of age. Last a1c was 13.2, which was down from previous of a1c of 14. Patient is working with doctor to improve glucose control. + Family history of blindness--father--cause of blindness unknown.  Referring physician: Dr. Anthony Sar [phone] [place], [place] [address]  HISTORICAL INFORMATION:   Selected notes from the MEDICAL RECORD NUMBER Referred by Dr. Marin Comment LEE: unsure Ocular Hx- decreased vision due to Diabetes PMH- DM    CURRENT MEDICATIONS: Current Outpatient Medications (Ophthalmic Drugs)  Medication Sig   prednisoLONE acetate (PRED  FORTE) 1 % ophthalmic suspension Place 1 drop into the right eye 4 (four) times daily.   No current facility-administered medications for this visit. (Ophthalmic Drugs)   Current Outpatient Medications (Other)  Medication Sig   Accu-Chek Softclix Lancets lancets To check blood sugars 4 times a day. Fasting and 2 hours after breakfast, lunch and dinner.   acetaminophen (TYLENOL) 500 MG tablet Take 500 mg by mouth every 6 (six) hours as needed. (Patient not taking: Reported on 11/02/2020)   Blood Pressure Monitoring (BLOOD PRESSURE KIT) DEVI 1 Device by Does not apply route once a week. Please take blood pressure once a week and record in Babyscripts.   glucose blood (ACCU-CHEK GUIDE) test strip To check blood sugars 4 times a day. Fasting, and 2 hours after Breakfast, Lunch and Dinner   insulin aspart (NOVOLOG) 100 UNIT/ML FlexPen Inject 10 Units into the skin 3 (three) times daily before meals.   insulin glargine (LANTUS SOLOSTAR) 100 UNIT/ML Solostar Pen Inject 30 Units into the skin daily.   Insulin Pen Needle (PENTIPS) 32G X 4 MM MISC Use as directed   loratadine (CLARITIN) 10 MG tablet Take 1 tablet (10 mg total) by mouth daily.   Misc. Devices (GOJJI WEIGHT SCALE) MISC 1 Device by Does not apply route once a week. Please take weight and record in Babyscripts once a week.   Prenatal MV & Min w/FA-DHA (PRENATAL ADULT GUMMY/DHA/FA PO)  Take by mouth.   No current facility-administered medications for this visit. (Other)      REVIEW OF SYSTEMS: ROS   Positive for: Gastrointestinal, Musculoskeletal, Endocrine, Eyes, Respiratory, Psychiatric Negative for: Constitutional, Neurological, Skin, Genitourinary, HENT, Cardiovascular, Allergic/Imm, Heme/Lymph Last edited by Matthew Folks, COA on 11/09/2020  8:28 AM.       ALLERGIES Allergies  Allergen Reactions   Amoxicillin Hives   Augmentin [Amoxicillin-Pot Clavulanate] Hives   Penicillins Hives    Has patient had a PCN reaction causing  immediate rash, facial/tongue/throat swelling, SOB or lightheadedness with hypotension: No Has patient had a PCN reaction causing severe rash involving mucus membranes or skin necrosis: No Has patient had a PCN reaction that required hospitalization No Has patient had a PCN reaction occurring within the last 10 years: No If all of the above answers are "NO", then may proceed with Cephalosporin use.   Shellfish Allergy Swelling and Other (See Comments)    Itching lips   Suprax [Cefixime]     PAST MEDICAL HISTORY Past Medical History:  Diagnosis Date   ADHD (attention deficit hyperactivity disorder)    Asthma    Diabetes mellitus    Type 2   Elevated blood pressure    Obesity    PCOS (polycystic ovarian syndrome)    Past Surgical History:  Procedure Laterality Date   EXTERNAL EAR SURGERY     ear tubes    FAMILY HISTORY Family History  Problem Relation Age of Onset   Diabetes Mother    Vision loss Mother    ADD / ADHD Brother    Allergies Brother    Asthma Brother    Vision loss Brother    Cancer Paternal Grandfather        Colon Cancer   Heart disease Father 97       died from MI at age 72    SOCIAL HISTORY Social History   Tobacco Use   Smoking status: Every Day    Packs/day: 0.20    Pack years: 0.00    Types: Cigarettes    Last attempt to quit: 08/24/2020    Years since quitting: 0.2   Smokeless tobacco: Never  Vaping Use   Vaping Use: Never used  Substance Use Topics   Alcohol use: Not Currently   Drug use: Not Currently    Types: Marijuana         OPHTHALMIC EXAM:  Base Eye Exam     Visual Acuity (Snellen - Linear)       Right Left   Dist [place] 20/350 - 20/50   Dist ph  NI 20/30 -2         Tonometry (Tonopen, 8:31 AM)       Right Left   Pressure 19 22         Pupils       Dark Light Shape React APD   Right 4 3 Round Brisk None   Left 4 3 Round Brisk None         Visual Fields (Counting fingers)       Left Right    Full  Full         Extraocular Movement       Right Left    Full, Ortho Full, Ortho         Neuro/Psych     Oriented x3: Yes   Mood/Affect: Normal         Dilation     Both eyes: 1.0% Mydriacyl, 2.5%  Phenylephrine @ 8:31 AM           Slit Lamp and Fundus Exam     External Exam       Right Left   External Normal Normal         Slit Lamp Exam       Right Left   Lids/Lashes Normal Normal   Conjunctiva/Sclera melanosis melanosis   Cornea trace PEE trace PEE   Anterior Chamber deep and clear, narrow temporal angle deep and clear, narrow temporal angle   Iris round and moderateley dilated, no NVI round and moderateley dilated, no NVI   Lens 1-2+ coritcal 1-2+ coritcal   Vitreous mild syneresis, +pigment mild syneresis         Fundus Exam       Right Left   Disc +NVE, +fibrosis, pink and sharp pink and sharp, no NVE   C/D Ratio  0.2   Macula flat, blunted foveal reflex, fibrosis, +NVE and ERM  with stria, scattered MA/DBH, +cystic changes flat, good reflex, scattered MA   Vessels Tortuous, Vascular attenuation, +NV, AV crossing changes attenuated, tortuous, AV crossing chagnes   Periphery attached, scattered MA and DBH, +pre-retinal heme nasal midzone, CWS attached, scattered MA, CWS about the disc           Refraction     Manifest Refraction       Sphere Cylinder Axis Dist VA   Right -1.50 +0.50 180 20/100-2   Left -0.50 Sphere  20/30-2            IMAGING AND PROCEDURES  Imaging and Procedures for 11/09/2020  OCT, Retina - OU - Both Eyes       Right Eye Quality was good. Central Foveal Thickness: 402. Progression has no prior data. Findings include abnormal foveal contour, intraretinal fluid, no SRF, epiretinal membrane, intraretinal hyper-reflective material, preretinal fibrosis, vitreous traction, vitreomacular adhesion .   Left Eye Quality was good. Central Foveal Thickness: 271. Progression has no prior data. Findings include normal  foveal contour, no SRF, no IRF, intraretinal hyper-reflective material (Trace cystic changes, trace ERM).   Notes *Images captured and stored on drive  Diagnosis / Impression:  OD: DME and pre-retinal fibrosis OS: NFP, no IRF/SRF; trace cystic changes and trace ERM  Clinical management:  See below  Abbreviations: NFP - Normal foveal profile. CME - cystoid macular edema. PED - pigment epithelial detachment. IRF - intraretinal fluid. SRF - subretinal fluid. EZ - ellipsoid zone. ERM - epiretinal membrane. ORA - outer retinal atrophy. ORT - outer retinal tubulation. SRHM - subretinal hyper-reflective material. IRHM - intraretinal hyper-reflective material      Panretinal Photocoagulation - OD - Right Eye       LASER PROCEDURE NOTE  Diagnosis:   Proliferative Diabetic Retinopathy, RIGHT EYE  Procedure:  Pan-retinal photocoagulation using slit lamp laser, RIGHT EYE  Anesthesia:  Topical  Surgeon: Bernarda Caffey, MD, PhD   Informed consent obtained, operative eye marked, and time out performed prior to initiation of laser.   Lumenis [id number] slit lamp laser Pattern: 3x3 square Power: 270 mW Duration: 30 msec  Spot size: 200 microns  # spots: [phone] spots   Complications: None.  RTC: 2 wks for DFE/OCT, FA, possible injection  Patient tolerated the procedure well and received written and verbal post-procedure care information/education.              ASSESSMENT/PLAN:    ICD-10-CM   1. 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 Retinal edema  H35.81 OCT, Retina - OU - Both Eyes    3. 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 Essential hypertension  I10     5. Hypertensive retinopathy of both eyes  H35.033       1,2. PDR with macular  edema OD - The incidence, risk factors for progression, natural history and treatment options for diabetic retinopathy  were discussed with patient.   - The need for close monitoring of blood glucose, blood pressure, and serum lipids, avoiding cigarette or any type of tobacco, and the need for long term follow up was also discussed with patient. - BCVA OD 20/100- - exam shows florid NVD and NVE OD - OCT shows diabetic macular edema and pre-retinal fibrosis OD - The natural history, pathology, and characteristics of diabetic macular edema discussed with patient.  A generalized discussion of the major clinical trials concerning treatment of diabetic macular edema (ETDRS, DCT, SCORE, RISE / RIDE, and ongoing DRCR net studies) was completed.  This discussion included mention of the various approaches to treating diabetic macular edema (observation, laser photocoagulation, anti-VEGF injections with lucentis / Avastin / Eylea, steroid injections with Kenalog / Ozurdex, and intraocular surgery with vitrectomy).  The goal hemoglobin A1C of 6-7 was discussed, as well as importance of smoking cessation and hypertension control.  Need for ongoing treatment and monitoring were specifically discussed with reference to chronic nature of diabetic macular edema. - recommend PRP OD today for NV - pt wishes to proceed - RBA of procedure discussed, questions answered - informed consent obtained and signed - Pred Forte qid OD for 7 days, then stop - f/u in 2 wks for DFE/OCT, FA OD, possible avastin OD  3. Severe NPDR OS - The incidence, risk factors for progression, natural history and treatment options for diabetic retinopathy were discussed with patient.   - The need for close monitoring of blood glucose, blood pressure, and serum lipids, avoiding cigarette or any type of tobacco, and the need for long term follow up was also discussed with patient. - exam shows scattered IRH and CWS  - OCT without diabetic macular  edema, left eye   4,5. Hypertensive retinopathy OU - discussed importance of tight BP control - monitor  Ophthalmic Meds Ordered this visit:  Meds ordered this encounter  Medications   prednisoLONE acetate (PRED FORTE) 1 % ophthalmic suspension    Sig: Place 1 drop into the right eye 4 (four) times daily.    Dispense:  10 mL    Refill:  0        Return in about 2 weeks (around 11/23/2020) for PDR OD, sev NPDR OS - , Dilated Exam, OCT, Fluorescein Angiogram, Possible Injxn.  There are no Patient Instructions on file for this visit.   Explained the diagnoses, plan, and follow up with the patient and they expressed understanding.  Patient expressed understanding of the importance of proper follow up care.   This document serves as a record of services personally performed by Gardiner Sleeper, MD, PhD. It was created on their behalf by Leonie Douglas, an ophthalmic technician. The creation of this record is the provider's dictation and/or activities during the visit.    Electronically signed by: Leonie Douglas COA, 11/09/20  12:24 PM  This document serves  as a record of services personally performed by Gardiner Sleeper, MD, PhD. It was created on their behalf by Roselee Nova, COMT. The creation of this record is the provider's dictation and/or activities during the visit.  Electronically signed by: Roselee Nova, COMT 11/09/20 12:24 PM  Gardiner Sleeper, M.D., Ph.D. Diseases & Surgery of the Retina and [place] 11/09/2020  I have reviewed the above documentation for accuracy and completeness, and I agree with the above. Gardiner Sleeper, M.D., Ph.D. 11/09/20 12:24 PM  Abbreviations: M myopia (nearsighted); A astigmatism; H hyperopia (farsighted); P presbyopia; Mrx spectacle prescription;  CTL contact lenses; OD right eye; OS left eye; OU both eyes  XT exotropia; ET esotropia; PEK punctate epithelial keratitis; PEE punctate epithelial erosions; DES dry eye  syndrome; MGD meibomian gland dysfunction; ATs artificial tears; PFAT's preservative free artificial tears; [place] nuclear sclerotic cataract; PSC posterior subcapsular cataract; ERM epi-retinal membrane; PVD posterior vitreous detachment; RD retinal detachment; DM diabetes mellitus; DR diabetic retinopathy; NPDR non-proliferative diabetic retinopathy; PDR proliferative diabetic retinopathy; CSME clinically significant macular edema; DME diabetic macular edema; dbh dot blot hemorrhages; CWS cotton wool spot; POAG primary open angle glaucoma; C/D cup-to-disc ratio; HVF humphrey visual field; GVF goldmann visual field; OCT optical coherence tomography; IOP intraocular pressure; BRVO Branch retinal vein occlusion; CRVO central retinal vein occlusion; CRAO central retinal artery occlusion; BRAO branch retinal artery occlusion; RT retinal tear; SB scleral buckle; PPV pars plana vitrectomy; VH Vitreous hemorrhage; PRP panretinal laser photocoagulation; IVK intravitreal kenalog; VMT vitreomacular traction; MH Macular hole;  NVD neovascularization of the disc; NVE neovascularization elsewhere; AREDS age related eye disease study; ARMD age related macular degeneration; POAG primary open angle glaucoma; EBMD epithelial/anterior basement membrane dystrophy; ACIOL anterior chamber intraocular lens; IOL intraocular lens; PCIOL posterior chamber intraocular lens; Phaco/IOL phacoemulsification with intraocular lens placement; [place] photorefractive keratectomy; LASIK laser assisted in situ keratomileusis; HTN hypertension; DM diabetes mellitus; COPD chronic obstructive pulmonary disease

## 2020-11-07 ENCOUNTER — Ambulatory Visit (HOSPITAL_COMMUNITY)
Admission: RE | Admit: 2020-11-07 | Discharge: 2020-11-07 | Disposition: A | Discharge status: Home / Self Care | Payer: Medicaid Other | Source: Ambulatory Visit | Attending: Obstetrics and Gynecology | Admitting: Obstetrics and Gynecology

## 2020-11-07 ENCOUNTER — Other Ambulatory Visit: Payer: Self-pay

## 2020-11-07 ENCOUNTER — Telehealth: Payer: Self-pay | Admitting: Obstetrics and Gynecology

## 2020-11-07 DIAGNOSIS — O020 Blighted ovum and nonhydatidiform mole: Secondary | ICD-10-CM | POA: Diagnosis present

## 2020-11-07 NOTE — Telephone Encounter (Signed)
GYN Note Patient called and I told her that it's unsure what is in the uterus and could be a polyp or maybe retained products of conception but I do recommend hysteroscopy, d&c to eval and remove, which she is amenable to. Pt asymptomatic. Scheduling sent OR  Durene Romans MD Attending Center for [place] (Faculty Practice) 11/07/2020 Time: [phone]

## 2020-11-08 ENCOUNTER — Encounter: Payer: Self-pay | Admitting: *Deleted

## 2020-11-08 ENCOUNTER — Other Ambulatory Visit: Payer: Self-pay | Admitting: Obstetrics and Gynecology

## 2020-11-08 ENCOUNTER — Telehealth: Payer: Self-pay | Admitting: *Deleted

## 2020-11-08 NOTE — Telephone Encounter (Signed)
Call to patient. Advised surgery is scheduled for Wed, 11-21-20 at 1pm, arrive 11am at [facility]. Advised will receive letter in mail, visible in My Chart and hospital brochure with map.   Encounter closed.

## 2020-11-09 ENCOUNTER — Other Ambulatory Visit: Payer: Self-pay

## 2020-11-09 ENCOUNTER — Encounter (INDEPENDENT_AMBULATORY_CARE_PROVIDER_SITE_OTHER): Payer: Self-pay | Admitting: Ophthalmology

## 2020-11-09 ENCOUNTER — Other Ambulatory Visit: Payer: Medicaid Other

## 2020-11-09 ENCOUNTER — Ambulatory Visit (INDEPENDENT_AMBULATORY_CARE_PROVIDER_SITE_OTHER): Payer: Medicaid Other | Admitting: Ophthalmology

## 2020-11-09 DIAGNOSIS — H35033 Hypertensive retinopathy, bilateral: Secondary | ICD-10-CM

## 2020-11-09 DIAGNOSIS — I10 Essential (primary) hypertension: Secondary | ICD-10-CM | POA: Diagnosis not present

## 2020-11-09 DIAGNOSIS — E113511 Type 2 diabetes mellitus with proliferative diabetic retinopathy with macular edema, right eye: Secondary | ICD-10-CM | POA: Diagnosis not present

## 2020-11-09 DIAGNOSIS — H3581 Retinal edema: Secondary | ICD-10-CM

## 2020-11-09 DIAGNOSIS — O020 Blighted ovum and nonhydatidiform mole: Secondary | ICD-10-CM

## 2020-11-09 DIAGNOSIS — E113412 Type 2 diabetes mellitus with severe nonproliferative diabetic retinopathy with macular edema, left eye: Secondary | ICD-10-CM

## 2020-11-09 MED ORDER — PREDNISOLONE ACETATE 1 % OP SUSP: 1.0000 [drp] | Freq: Four times a day (QID) | OPHTHALMIC | 0 refills | Status: DC

## 2020-11-09 NOTE — Progress Notes (Signed)
Opened in error. Beta HCG level already ordered by Ilda Basset, MD for lab appt today.

## 2020-11-10 LAB — BETA HCG QUANT (REF LAB): hCG Quant: <1 m[IU]/mL

## 2020-11-12 ENCOUNTER — Encounter: Payer: Self-pay | Admitting: Obstetrics and Gynecology

## 2020-11-16 ENCOUNTER — Other Ambulatory Visit: Payer: Self-pay

## 2020-11-16 ENCOUNTER — Encounter (HOSPITAL_BASED_OUTPATIENT_CLINIC_OR_DEPARTMENT_OTHER): Payer: Self-pay | Admitting: Obstetrics and Gynecology

## 2020-11-16 NOTE — Progress Notes (Signed)
Spoke w/ via phone for pre-op interview--- Pt Lab needs dos----   urine preg            Lab results------ current ekg in epic/ chart;  pt getting CBC, CMP done 11-19-2020 @ 1030 COVID test -----patient states asymptomatic no test needed Arrive at -------  1030  on 11-21-2020 NPO after MN NO Solid Food.  Clear liquids from MN until--- 0930 Med rec completed Medications to take morning of surgery ----- Claritin Diabetic medication ----- night before surgery do half dose of lantus insulin;  do novolog insulin day before surgery as ususal and no novolog morning of surgery Patient instructed no nail polish to be worn day of surgery Patient instructed to bring photo id and insurance card day of surgery Patient aware to have Driver (ride ) / caregiver for 24 hours after surgery -- per pt it will be either her boyfriend or her mother, aware to have name/ number at [employer] Patient Special Instructions ----- n/a Pre-Op special Istructions ----- n/a Patient verbalized understanding of instructions that were given at this phone interview. Patient denies shortness of breath, chest pain, fever, cough at this phone interview.

## 2020-11-19 ENCOUNTER — Other Ambulatory Visit: Payer: Self-pay

## 2020-11-19 ENCOUNTER — Encounter (HOSPITAL_COMMUNITY)
Admission: RE | Admit: 2020-11-19 | Discharge: 2020-11-19 | Disposition: A | Discharge status: Home / Self Care | Payer: Medicaid Other | Source: Ambulatory Visit | Attending: Obstetrics and Gynecology | Admitting: Obstetrics and Gynecology

## 2020-11-19 DIAGNOSIS — Z01812 Encounter for preprocedural laboratory examination: Secondary | ICD-10-CM | POA: Diagnosis not present

## 2020-11-19 LAB — COMPREHENSIVE METABOLIC PANEL
ALT: 20 U/L (ref 0–44)
AST: 19 U/L (ref 15–41)
Albumin: 4 g/dL (ref 3.5–5.0)
Alkaline Phosphatase: 65 U/L (ref 38–126)
Anion gap: 8 (ref 5–15)
BUN: 12 mg/dL (ref 6–20)
CO2: 24 mmol/L (ref 22–32)
Calcium: 9.2 mg/dL (ref 8.9–10.3)
Chloride: 107 mmol/L (ref 98–111)
Creatinine, Ser: 0.55 mg/dL (ref 0.44–1.00)
GFR, Estimated: >60 mL/min (ref >60)
Glucose, Bld: 261 mg/dL — ABNORMAL HIGH (ref 70–99)
Potassium: 4.2 mmol/L (ref 3.5–5.1)
Sodium: 139 mmol/L (ref 135–145)
Total Bilirubin: 0.6 mg/dL (ref 0.3–1.2)
Total Protein: 7.9 g/dL (ref 6.5–8.1)

## 2020-11-19 LAB — CBC
HCT: 36.8 % (ref 36.0–46.0)
Hemoglobin: 12 g/dL (ref 12.0–15.0)
MCH: 27.1 pg (ref 26.0–34.0)
MCHC: 32.6 g/dL (ref 30.0–36.0)
MCV: 83.3 fL (ref 80.0–100.0)
Platelets: 575 10*3/uL — ABNORMAL HIGH (ref 150–400)
RBC: 4.42 MIL/uL (ref 3.87–5.11)
RDW: 13.2 % (ref 11.5–15.5)
WBC: 8.1 10*3/uL (ref 4.0–10.5)
nRBC: 0 % (ref 0.0–0.2)

## 2020-11-20 NOTE — Progress Notes (Signed)
[facility] Note  11/23/2020     CHIEF COMPLAINT Patient presents for Retina Follow Up   HISTORY OF PRESENT ILLNESS: Mercedes Valdez is a 25 y.o. female who presents to the clinic today for:   HPI     Retina Follow Up   Patient presents with  Diabetic Retinopathy.  In both eyes.  Duration of 2 weeks.  I, the attending physician,  performed the HPI with the patient and updated documentation appropriately.        Comments   2 week follow up PDR OD- No change in vision.  OD is like looking through a sheer curtain.  Unable to see anything out of it.  BS 242 last night, can't remember fasting yesterday A1C 13- pt states she needs to go back because the insulin doesn't seem to be helping.  Using Prednisolone QID OD Patient had D&C Wednesday.       Last edited by Bernarda Caffey, MD on 11/23/2020 10:48 PM.     Patient states she had no problems after laser in procedure, but she still "can't see"  Referring physician: Dr. Anthony Sar [phone]  [place], [place] [address]  HISTORICAL INFORMATION:   Selected notes from the MEDICAL RECORD NUMBER Referred by Dr. Marin Comment LEE: unsure Ocular Hx- decreased vision due to Diabetes PMH- DM    CURRENT MEDICATIONS: Current Outpatient Medications (Ophthalmic Drugs)  Medication Sig   prednisoLONE acetate (PRED FORTE) 1 % ophthalmic suspension Place 1 drop into the right eye 4 (four) times daily.   No current facility-administered medications for this visit. (Ophthalmic Drugs)   Current Outpatient Medications (Other)  Medication Sig   acetaminophen (TYLENOL) 500 MG tablet Take 500 mg by mouth every 6 (six) hours as needed.   ibuprofen (MOTRIN IB) 200 MG tablet Take 3 tablets (600 mg total) by mouth every 6 (six) hours as needed.   insulin aspart (NOVOLOG) 100 UNIT/ML FlexPen Inject 10 Units into the skin 3 (three) times daily before meals.   insulin glargine (LANTUS SOLOSTAR) 100 UNIT/ML Solostar Pen Inject 30 Units  into the skin daily. (Patient taking differently: Inject 30 Units into the skin at bedtime.)   loratadine (CLARITIN) 10 MG tablet Take 1 tablet (10 mg total) by mouth daily.   medroxyPROGESTERone Acetate (DEPO-PROVERA IM) Inject into the muscle.   Prenatal MV & Min w/FA-DHA (PRENATAL ADULT GUMMY/DHA/FA PO) Take by mouth daily.   Accu-Chek Softclix Lancets lancets To check blood sugars 4 times a day. Fasting and 2 hours after breakfast, lunch and dinner.   Blood Pressure Monitoring (BLOOD PRESSURE KIT) DEVI 1 Device by Does not apply route once a week. Please take blood pressure once a week and record in Babyscripts.   glucose blood (ACCU-CHEK GUIDE) test strip To check blood sugars 4 times a day. Fasting, and 2 hours after Breakfast, Lunch and Dinner   Insulin Pen Needle (PENTIPS) 32G X 4 MM MISC Use as directed   Misc. Devices (GOJJI WEIGHT SCALE) MISC 1 Device by Does not apply route once a week. Please take weight and record in Babyscripts once a week.   No current facility-administered medications for this visit. (Other)      REVIEW OF SYSTEMS: ROS   Positive for: Gastrointestinal, Musculoskeletal, Endocrine, Eyes, Respiratory, Psychiatric Negative for: Constitutional, Neurological, Skin, Genitourinary, HENT, Cardiovascular, Allergic/Imm, Heme/Lymph Last edited by Leonie Douglas, COA on 11/23/2020  9:33 AM.        ALLERGIES Allergies  Allergen  Reactions   Amoxicillin Hives   Augmentin [Amoxicillin-Pot Clavulanate] Hives   Penicillins Hives    Has patient had a PCN reaction causing immediate rash, facial/tongue/throat swelling, SOB or lightheadedness with hypotension: No Has patient had a PCN reaction causing severe rash involving mucus membranes or skin necrosis: No Has patient had a PCN reaction that required hospitalization No Has patient had a PCN reaction occurring within the last 10 years: No If all of the above answers are "NO", then may proceed with Cephalosporin use.    Shellfish Allergy Swelling and Other (See Comments)    Itching lips   Suprax [Cefixime]     Pt unsure reaction, may have been childhood reaction   Latex Rash    PAST MEDICAL HISTORY Past Medical History:  Diagnosis Date   ADHD (attention deficit hyperactivity disorder)    Elevated blood pressure reading without diagnosis of hypertension    Endometrial mass    History of 2019 novel coronavirus disease (COVID-19) 03/09/2020   positive result in epic,  per pt mild to moderate symptoms that resolved   History of asthma    child   History of gonorrhea 2016   Insulin dependent type 2 diabetes mellitus ([facility])    followd by pcp---- (11-16-2020 per pt checks blood sugar at home 4-5 times daily,  fasting sugar--- 70--95)   PCOS (polycystic ovarian syndrome)    Retinopathy of both eyes    followed by dr Coralyn Pear---  right eye proliferative w/ macular edema;  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   Past Surgical History:  Procedure Laterality Date   DILATATION & CURETTAGE/HYSTEROSCOPY WITH MYOSURE N/A 11/21/2020   Procedure: [place];  Surgeon: Aletha Halim, MD;  Location: [facility];  Service: Gynecology;  Laterality: N/A;   KNEE ARTHROSCOPY W/ ACL RECONSTRUCTION Left 2011   TYMPANOSTOMY TUBE PLACEMENT Bilateral    child    FAMILY HISTORY Family History  Problem Relation Age of Onset   Diabetes Mother    Vision loss Mother    ADD / ADHD Brother    Allergies Brother    Asthma Brother    Vision loss Brother    Cancer Paternal Grandfather        Colon Cancer   Heart disease Father 78       died from MI at age 72    SOCIAL HISTORY Social History   Tobacco Use   Smoking status: Every Day    Years: 6.00    Pack years: 0.00    Types: Cigarettes   Smokeless tobacco: Never   Tobacco comments:    11-16-2020  per pt 3 cig per day  Vaping Use   Vaping Use: Some days   Devices: uses different types  Substance  Use Topics   Alcohol use: Not Currently   Drug use: Not Currently    Types: Marijuana         OPHTHALMIC EXAM:  Base Eye Exam     Visual Acuity (Snellen - Linear)       Right Left   Dist [place] 20/150- 20/40   Dist ph [place] NI 20/30  OD omitted last 1-2 letters of each line.         Tonometry (Tonopen, 9:40 AM)       Right Left   Pressure 16 19         Pupils       Dark Light Shape React APD   Right 4 3 Round Minimal  None   Left 4 3 Round Minimal None         Visual Fields (Counting fingers)       Left Right    Full    Restrictions  Partial outer superior temporal deficiency         Extraocular Movement       Right Left    Full Full         Neuro/Psych     Oriented x3: Yes   Mood/Affect: Normal         Dilation     Both eyes: 1.0% Mydriacyl, 2.5% Phenylephrine @ 9:40 AM           Slit Lamp and Fundus Exam     External Exam       Right Left   External Normal Normal         Slit Lamp Exam       Right Left   Lids/Lashes Normal Normal   Conjunctiva/Sclera melanosis melanosis   Cornea trace PEE 1-2+ Punctate epithelial erosions   Anterior Chamber deep and clear, narrow temporal angle deep and clear, narrow temporal angle   Iris round and poorly dilated, slightly reactive, no NVI round and poorly dilated, slightly reactive, no NVI   Lens 1-2+ coritcal 1-2+ coritcal   Vitreous mild syneresis, +pigment mild syneresis         Fundus Exam       Right Left   Disc +NVE - regressing, +fibrosis, pink and sharp pink and sharp, no NVE   C/D Ratio  0.2   Macula flat, blunted foveal reflex, fibrosis, +NVE -- early regression, ERM  with striae, scattered MA/DBH, +cystic changes flat, good reflex, scattered MA   Vessels Tortuous, Vascular attenuation, +NV, AV crossing changes attenuated, tortuous, AV crossing chagnes   Periphery attached, scattered MA and DBH, +pre-retinal heme nasal midzone, CWS attached, scattered MA, CWS about the disc             IMAGING AND PROCEDURES  Imaging and Procedures for 11/23/2020  OCT, Retina - OU - Both Eyes       Right Eye Quality was good. Central Foveal Thickness: 276. Progression has improved. Findings include abnormal foveal contour, intraretinal fluid, no SRF, epiretinal membrane, intraretinal hyper-reflective material, preretinal fibrosis, vitreous traction, vitreomacular adhesion (Interval improvement in foveal profile and central cystic changes / IRF, but mild interval progression of fibrosis).   Left Eye Quality was good. Central Foveal Thickness: 267. Progression has been stable. Findings include normal foveal contour, no SRF, no IRF, intraretinal hyper-reflective material (Trace cystic changes, trace ERM).   Notes *Images captured and stored on drive  Diagnosis / Impression:  OD: Interval improvement in foveal profile and central cystic changes / IRF, but mild interval progression of fibrosis OS: NFP, no IRF/SRF; trace cystic changes and trace ERM  Clinical management:  See below  Abbreviations: NFP - Normal foveal profile. CME - cystoid macular edema. PED - pigment epithelial detachment. IRF - intraretinal fluid. SRF - subretinal fluid. EZ - ellipsoid zone. ERM - epiretinal membrane. ORA - outer retinal atrophy. ORT - outer retinal tubulation. SRHM - subretinal hyper-reflective material. IRHM - intraretinal hyper-reflective material      Fluorescein Angiography Optos (Transit OD)       Right Eye Progression has no prior data. Early phase findings include blockage, vascular perfusion defect, retinal neovascularization, leakage. Mid/Late phase findings include blockage, leakage, retinal neovascularization, vascular perfusion defect.   Left Eye Progression has no prior data.  Early phase findings include microaneurysm, retinal neovascularization. Mid/Late phase findings include microaneurysm, retinal neovascularization, leakage (Early NV).   Notes **Images stored on  drive**  Impression: PDR OU (OD >> OS) NVE OU (OD >> OS) Vascular nonperfusion OU               ASSESSMENT/PLAN:    ICD-10-CM   1. Proliferative diabetic retinopathy of both eyes with macular edema associated with type 2 diabetes mellitus ([place])  U54.2706     2. Retinal edema  H35.81 OCT, Retina - OU - Both Eyes    3. Essential hypertension  I10     4. Hypertensive retinopathy of both eyes  H35.033 Fluorescein Angiography Optos (Transit OD)     1,2. PDR OU (OD > OS) OD with macular edema; OS without DME - The incidence, risk factors for progression, natural history and treatment options for diabetic retinopathy were discussed with patient.   - The need for close monitoring of blood glucose, blood pressure, and serum lipids, avoiding cigarette or any type of tobacco, and the need for long term follow up was also discussed with patient. - FA today (7.1.22) shows +NV OU (OD >> OS); late-leaking MA, vascular nonperfusion -- would benefit from PRP OU - s/p PRP OD (06.17.22) - OCT OD: Interval improvement in foveal profile and central cystic changes / IRF, but mild interval progression of fibrosis; OS: NFP, no IRF/SRF; trace cystic changes and trace ERM - discussed findings and prognosis - BCVA OD 20/150-; OS 20/30 - f/u in 2 wks for DFE/OCT, PRP OS  3,4. Hypertensive retinopathy OU - discussed importance of tight BP control - monitor  Ophthalmic Meds Ordered this visit:  No orders of the defined types were placed in this encounter.     Return in about 2 weeks (around 12/07/2020) for f/u 2 weeks PDR OD, DFE, OCT.  There are no Patient Instructions on file for this visit.   Explained the diagnoses, plan, and follow up with the patient and they expressed understanding.  Patient expressed understanding of the importance of proper follow up care.   This document serves as a record of services personally performed by Gardiner Sleeper, MD, PhD. It was created on their behalf  by San Jetty. Owens Shark, OA an ophthalmic technician. The creation of this record is the provider's dictation and/or activities during the visit.    Electronically signed by: San Jetty. Owens Shark, [place] 06.28.2022 11:07 PM  Gardiner Sleeper, M.D., Ph.D. Diseases & Surgery of the Retina and Vitreous [facility]  I have reviewed the above documentation for accuracy and completeness, and I agree with the above. Gardiner Sleeper, M.D., Ph.D. 11/23/20 11:07 PM  Abbreviations: M myopia (nearsighted); A astigmatism; H hyperopia (farsighted); P presbyopia; Mrx spectacle prescription;  CTL contact lenses; OD right eye; OS left eye; OU both eyes  XT exotropia; ET esotropia; PEK punctate epithelial keratitis; PEE punctate epithelial erosions; DES dry eye syndrome; MGD meibomian gland dysfunction; ATs artificial tears; PFAT's preservative free artificial tears; [place] nuclear sclerotic cataract; PSC posterior subcapsular cataract; ERM epi-retinal membrane; PVD posterior vitreous detachment; RD retinal detachment; DM diabetes mellitus; DR diabetic retinopathy; NPDR non-proliferative diabetic retinopathy; PDR proliferative diabetic retinopathy; CSME clinically significant macular edema; DME diabetic macular edema; dbh dot blot hemorrhages; CWS cotton wool spot; POAG primary open angle glaucoma; C/D cup-to-disc ratio; HVF humphrey visual field; GVF goldmann visual field; OCT optical coherence tomography; IOP intraocular pressure; BRVO Branch retinal vein occlusion; CRVO central retinal  vein occlusion; CRAO central retinal artery occlusion; BRAO branch retinal artery occlusion; RT retinal tear; SB scleral buckle; PPV pars plana vitrectomy; VH Vitreous hemorrhage; PRP panretinal laser photocoagulation; IVK intravitreal kenalog; VMT vitreomacular traction; MH Macular hole;  NVD neovascularization of the disc; NVE neovascularization elsewhere; AREDS age related eye disease study; ARMD age related macular degeneration;  POAG primary open angle glaucoma; EBMD epithelial/anterior basement membrane dystrophy; ACIOL anterior chamber intraocular lens; IOL intraocular lens; PCIOL posterior chamber intraocular lens; Phaco/IOL phacoemulsification with intraocular lens placement; [place] photorefractive keratectomy; LASIK laser assisted in situ keratomileusis; HTN hypertension; DM diabetes mellitus; COPD chronic obstructive pulmonary disease

## 2020-11-21 ENCOUNTER — Ambulatory Visit (HOSPITAL_BASED_OUTPATIENT_CLINIC_OR_DEPARTMENT_OTHER): Payer: Medicaid Other | Admitting: Anesthesiology

## 2020-11-21 ENCOUNTER — Encounter (HOSPITAL_BASED_OUTPATIENT_CLINIC_OR_DEPARTMENT_OTHER)
Admission: RE | Disposition: A | Discharge status: Home / Self Care | Payer: Self-pay | Source: Home / Self Care | Attending: Obstetrics and Gynecology

## 2020-11-21 ENCOUNTER — Encounter (HOSPITAL_BASED_OUTPATIENT_CLINIC_OR_DEPARTMENT_OTHER): Payer: Self-pay | Admitting: Obstetrics and Gynecology

## 2020-11-21 ENCOUNTER — Ambulatory Visit (HOSPITAL_BASED_OUTPATIENT_CLINIC_OR_DEPARTMENT_OTHER)
Admission: RE | Admit: 2020-11-21 | Discharge: 2020-11-21 | Disposition: A | Discharge status: Home / Self Care | Payer: Medicaid Other | Attending: Obstetrics and Gynecology | Admitting: Obstetrics and Gynecology

## 2020-11-21 ENCOUNTER — Other Ambulatory Visit: Payer: Self-pay

## 2020-11-21 DIAGNOSIS — Z8616 Personal history of COVID-19: Secondary | ICD-10-CM | POA: Insufficient documentation

## 2020-11-21 DIAGNOSIS — D251 Intramural leiomyoma of uterus: Secondary | ICD-10-CM | POA: Insufficient documentation

## 2020-11-21 DIAGNOSIS — N840 Polyp of corpus uteri: Secondary | ICD-10-CM | POA: Insufficient documentation

## 2020-11-21 DIAGNOSIS — Z794 Long term (current) use of insulin: Secondary | ICD-10-CM | POA: Insufficient documentation

## 2020-11-21 DIAGNOSIS — Z9889 Other specified postprocedural states: Secondary | ICD-10-CM

## 2020-11-21 DIAGNOSIS — Z6839 Body mass index (BMI) 39.0-39.9, adult: Secondary | ICD-10-CM | POA: Insufficient documentation

## 2020-11-21 DIAGNOSIS — Z881 Allergy status to other antibiotic agents status: Secondary | ICD-10-CM | POA: Diagnosis not present

## 2020-11-21 DIAGNOSIS — F17210 Nicotine dependence, cigarettes, uncomplicated: Secondary | ICD-10-CM | POA: Diagnosis not present

## 2020-11-21 DIAGNOSIS — Z880 Allergy status to penicillin: Secondary | ICD-10-CM | POA: Insufficient documentation

## 2020-11-21 HISTORY — PX: DILATATION & CURETTAGE/HYSTEROSCOPY WITH MYOSURE: SHX6511

## 2020-11-21 HISTORY — DX: Unspecified background retinopathy: H35.00

## 2020-11-21 HISTORY — DX: Type 2 diabetes mellitus without complications: Z79.4

## 2020-11-21 HISTORY — DX: Elevated blood-pressure reading, without diagnosis of hypertension: R03.0

## 2020-11-21 HISTORY — DX: Type 2 diabetes mellitus without complications: E11.9

## 2020-11-21 HISTORY — DX: Other specified conditions associated with female genital organs and menstrual cycle: N94.89

## 2020-11-21 HISTORY — DX: Personal history of other diseases of the respiratory system: Z87.09

## 2020-11-21 LAB — POCT PREGNANCY, URINE: Preg Test, Ur: NEGATIVE

## 2020-11-21 LAB — GLUCOSE, CAPILLARY
Glucose-Capillary: 270 mg/dL — ABNORMAL HIGH (ref 70–99)
Glucose-Capillary: 242 mg/dL — ABNORMAL HIGH (ref 70–99)
Glucose-Capillary: 223 mg/dL — ABNORMAL HIGH (ref 70–99)

## 2020-11-21 LAB — HCG, QUANTITATIVE, PREGNANCY: hCG, Beta Chain, Quant, S: <1 m[IU]/mL (ref <5)

## 2020-11-21 SURGERY — DILATATION & CURETTAGE/HYSTEROSCOPY WITH MYOSURE
Anesthesia: General | Site: Uterus

## 2020-11-21 MED ORDER — PROPOFOL 10 MG/ML IV BOLUS
INTRAVENOUS | Status: AC
  Filled 2020-11-21: qty 20

## 2020-11-21 MED ORDER — LABETALOL HCL 5 MG/ML IV SOLN
INTRAVENOUS | Status: AC
  Filled 2020-11-21: qty 4

## 2020-11-21 MED ORDER — ACETAMINOPHEN 500 MG PO TABS
ORAL_TABLET | ORAL | Status: AC
  Filled 2020-11-21: qty 2

## 2020-11-21 MED ORDER — FENTANYL CITRATE (PF) 100 MCG/2ML IJ SOLN: 25.0000 ug | INTRAMUSCULAR | Status: DC | PRN

## 2020-11-21 MED ORDER — LIDOCAINE HCL 1 % IJ SOLN
INTRAMUSCULAR | Status: DC | PRN
  Administered 2020-11-21: 20 mL

## 2020-11-21 MED ORDER — INSULIN ASPART 100 UNIT/ML IJ SOLN
INTRAMUSCULAR | Status: AC
  Filled 2020-11-21: qty 1

## 2020-11-21 MED ORDER — KETOROLAC TROMETHAMINE 30 MG/ML IJ SOLN
INTRAMUSCULAR | Status: AC
  Filled 2020-11-21: qty 1

## 2020-11-21 MED ORDER — KETOROLAC TROMETHAMINE 30 MG/ML IJ SOLN
INTRAMUSCULAR | Status: DC | PRN
  Administered 2020-11-21: 30 mg via INTRAVENOUS

## 2020-11-21 MED ORDER — ONDANSETRON HCL 4 MG/2ML IJ SOLN
INTRAMUSCULAR | Status: DC | PRN
  Administered 2020-11-21: 4 mg via INTRAVENOUS

## 2020-11-21 MED ORDER — MIDAZOLAM HCL 2 MG/2ML IJ SOLN
INTRAMUSCULAR | Status: AC
  Filled 2020-11-21: qty 2

## 2020-11-21 MED ORDER — MIDAZOLAM HCL 5 MG/5ML IJ SOLN
INTRAMUSCULAR | Status: DC | PRN
  Administered 2020-11-21: 2 mg via INTRAVENOUS

## 2020-11-21 MED ORDER — ONDANSETRON HCL 4 MG/2ML IJ SOLN
INTRAMUSCULAR | Status: AC
  Filled 2020-11-21: qty 2

## 2020-11-21 MED ORDER — FENTANYL CITRATE (PF) 100 MCG/2ML IJ SOLN
INTRAMUSCULAR | Status: AC
  Filled 2020-11-21: qty 2

## 2020-11-21 MED ORDER — PROPOFOL 10 MG/ML IV BOLUS
INTRAVENOUS | Status: DC | PRN
  Administered 2020-11-21: 200 mg via INTRAVENOUS

## 2020-11-21 MED ORDER — IBUPROFEN 200 MG PO TABS: 600.0000 mg | ORAL_TABLET | Freq: Four times a day (QID) | ORAL | 0 refills | Status: DC | PRN

## 2020-11-21 MED ORDER — INSULIN ASPART 100 UNIT/ML IJ SOLN
10.0000 [IU] | Freq: Once | INTRAMUSCULAR | Status: AC
  Administered 2020-11-21: 10 [IU] via SUBCUTANEOUS

## 2020-11-21 MED ORDER — LIDOCAINE HCL (CARDIAC) PF 100 MG/5ML IV SOSY
PREFILLED_SYRINGE | INTRAVENOUS | Status: DC | PRN
  Administered 2020-11-21: 60 mg via INTRAVENOUS

## 2020-11-21 MED ORDER — SODIUM CHLORIDE 0.9 % IR SOLN
Status: DC | PRN
  Administered 2020-11-21: 3000 mL

## 2020-11-21 MED ORDER — FENTANYL CITRATE (PF) 100 MCG/2ML IJ SOLN
INTRAMUSCULAR | Status: DC | PRN
  Administered 2020-11-21: 25 ug via INTRAVENOUS
  Administered 2020-11-21: 50 ug via INTRAVENOUS
  Administered 2020-11-21: 25 ug via INTRAVENOUS

## 2020-11-21 MED ORDER — LACTATED RINGERS IV SOLN: INTRAVENOUS | Status: DC

## 2020-11-21 MED ORDER — DEXAMETHASONE SODIUM PHOSPHATE 10 MG/ML IJ SOLN
INTRAMUSCULAR | Status: AC
  Filled 2020-11-21: qty 1

## 2020-11-21 MED ORDER — LABETALOL HCL 5 MG/ML IV SOLN: 5.0000 mg | INTRAVENOUS | Status: DC | PRN

## 2020-11-21 MED ORDER — SODIUM CHLORIDE 0.9 % IV SOLN
100.0000 mg | Freq: Once | INTRAVENOUS | Status: AC
  Administered 2020-11-21: 100 mg via INTRAVENOUS
  Filled 2020-11-21 (×2): qty 100

## 2020-11-21 MED ORDER — LIDOCAINE HCL (PF) 2 % IJ SOLN
INTRAMUSCULAR | Status: AC
  Filled 2020-11-21: qty 5

## 2020-11-21 MED ORDER — ACETAMINOPHEN 500 MG PO TABS: 1000.0000 mg | ORAL_TABLET | Freq: Once | ORAL | Status: DC

## 2020-11-21 SURGICAL SUPPLY — 19 items
CATH ROBINSON RED A/P 16FR (CATHETERS) ×3 IMPLANT
DEVICE MYOSURE LITE (MISCELLANEOUS) IMPLANT
DEVICE MYOSURE REACH (MISCELLANEOUS) ×3 IMPLANT
DILATOR CANAL MILEX (MISCELLANEOUS) IMPLANT
GAUZE 4X4 16PLY ~~LOC~~+RFID DBL (SPONGE) ×6 IMPLANT
GLOVE SURG POLYISO LF SZ7 (GLOVE) ×3 IMPLANT
GLOVE SURG UNDER POLY LF SZ7 (GLOVE) ×3 IMPLANT
GLOVE SURG UNDER POLY LF SZ7.5 (GLOVE) ×3 IMPLANT
GOWN STRL REUS W/TWL XL LVL3 (GOWN DISPOSABLE) ×3 IMPLANT
KIT PROCEDURE FLUENT (KITS) ×3 IMPLANT
MYOSURE XL FIBROID (MISCELLANEOUS)
PACK VAGINAL MINOR WOMEN LF (CUSTOM PROCEDURE TRAY) ×3 IMPLANT
PAD OB MATERNITY 4.3X12.25 (PERSONAL CARE ITEMS) ×3 IMPLANT
PAD PREP 24X48 CUFFED NSTRL (MISCELLANEOUS) ×3 IMPLANT
SEAL ROD LENS SCOPE MYOSURE (ABLATOR) ×3 IMPLANT
SUT VIC AB 2-0 SH 27 (SUTURE)
SUT VIC AB 2-0 SH 27XBRD (SUTURE) IMPLANT
SYSTEM TISS REMOVAL MYOSURE XL (MISCELLANEOUS) IMPLANT
TOWEL OR 17X26 10 PK STRL BLUE (TOWEL DISPOSABLE) ×3 IMPLANT

## 2020-11-21 NOTE — Discharge Instructions (Addendum)
   We will discuss your surgery once again in detail at your post-op visit in two to four weeks. If you haven't already done so, please call to make your appointment as soon as possible.  These instructions give you information on caring for yourself after your procedure. Your doctor may also give you more specific instructions. Call your doctor if you have any problems or questions after your procedure. HOME CARE Do not drive for 24 hours. Wait 1 week before doing any activities that wear you out. Do not stand for a long time. Limit stair climbing to once or twice a day. Rest often. Continue with your usual diet. Drink enough fluids to keep your pee (urine) clear or pale yellow. If you have a hard time pooping (constipation), you may: Take a medicine to help you go poop (laxative) as told by your doctor. Eat more fruit and bran. Drink more fluids. Take showers, not baths, for as long as told by your doctor. Do not swim or use a hot tub until your doctor says it is okay. Have someone with you for 1day after the procedure. Do not douche, use tampons, or have sex (intercourse) until seen by your doctor Only take medicines as told by your doctor. Do not take aspirin. It can cause bleeding. Keep all doctor visits. GET HELP IF: You have cramps or pain not helped by medicine. You have new pain in the belly (abdomen). You have a bad smelling fluid coming from your vagina. You have a rash. You have problems with any medicine. GET HELP RIGHT AWAY IF:  You start to bleed more than a regular period. You have a fever. You have chest pain. You have trouble breathing. You feel dizzy or feel like passing out (fainting). You pass out. You have pain in the tops of your shoulders. You have vaginal bleeding with or without clumps of blood (blood clots). MAKE SURE YOU: Understand these instructions. Will watch your condition. Will get help right away if you are not doing well or get  worse. Document Released: 02/19/2008 Document Revised: 05/17/2013 Document Reviewed: 12/09/2012 [facility] Patient Information 2015 [place], [place]. This information is not intended to replace advice given to you by your health care provider. Make sure you discuss any questions you have with your health care provider.   Post Anesthesia Home Care Instructions  Activity: Get plenty of rest for the remainder of the day. A responsible individual must stay with you for 24 hours following the procedure.  For the next 24 hours, DO NOT: -Drive a car -Paediatric nurse -Drink alcoholic beverages -Take any medication unless instructed by your physician -Make any legal decisions or sign important papers.  Meals: Start with liquid foods such as gelatin or soup. Progress to regular foods as tolerated. Avoid greasy, spicy, heavy foods. If nausea and/or vomiting occur, drink only clear liquids until the nausea and/or vomiting subsides. Call your physician if vomiting continues.  Special Instructions/Symptoms: Your throat may feel dry or sore from the anesthesia or the breathing tube placed in your throat during surgery. If this causes discomfort, gargle with warm salt water. The discomfort should disappear within 24 hours.

## 2020-11-21 NOTE — Op Note (Signed)
Operative Note   11/21/2020  PRE-OP DIAGNOSIS: 1.5-2cm intrauterine mass   POST-OP DIAGNOSIS: Endometrial polyp  SURGEON: Surgeon(s) and Role:    Aletha Halim, MD - Primary  ASSISTANT: None  PROCEDURE:  Hysteroscopy, Myosure polypectomy  ANESTHESIA: General and paracervical block  ESTIMATED BLOOD LOSS: 53mL  DRAINS: None  TOTAL IV FLUIDS: per anesthesia note  SPECIMENS: endometrial polyp to pathology  VTE PROPHYLAXIS: SCDs to the bilateral lower extremities  ANTIBIOTICS: Doxycycline 100mg  IV x 1 pre op (due to patient's poorly controlled diabetes)  COMPLICATIONS: none  DISPOSITION: PACU - hemodynamically stable.  CONDITION: stable  FLUID DEFICIT: 250mL  FINDINGS: Exam under anesthesia revealed 8 week sized uterus with no masses and bilateral adnexa without masses or fullness. On hysteroscopy, 1.5-2cm polyp at the posterior mid uterine wall, endometrial cavity atrophic and normal appearing and normal appearing bilateral tubal ostia. Patient sounded to cm 10.5 100% of polyp was removed  PROCEDURE IN DETAIL:  After informed consent was obtained, the patient was taken to the operating room where anesthesia was obtained without difficulty. The patient was positioned in the dorsal lithotomy position in [place]. The patient was examined under anesthesia, with the above noted findings.  The bi-valved speculum was placed inside the patient's vagina, and the the anterior lip of the cervix was seen and grasped with the tenaculum.  A paracervical block was achieved with 45mL of 1% lidocaine and then the cervix was easily dilated to a 19 French-Pratt dilator.  The hysteroscope was then introduced with the above findings and the Myosure polypectomy was done without issue.  Excellent hemostasis was noted, and all instruments were removed, with excellent hemostasis noted throughout.  She was then taken out of dorsal lithotomy. The patient tolerated the procedure well.  Sponge,  lap and instrument counts were correct x2.  The patient was taken to recovery room in excellent condition.  Durene Romans MD Attending Center for [employer] farm manager)

## 2020-11-21 NOTE — Progress Notes (Signed)
Dr. Ola Spurr aware of improved BP readings after cuff size change and reposition; Labetalol not given.

## 2020-11-21 NOTE — Anesthesia Procedure Notes (Signed)
Procedure Name: LMA Insertion Date/Time: 11/21/2020 12:53 PM Performed by: Justice Rocher, CRNA Pre-anesthesia Checklist: Patient identified, Emergency Drugs available, Suction available, Patient being monitored and Timeout performed Patient Re-evaluated:Patient Re-evaluated prior to induction Oxygen Delivery Method: Circle system utilized Preoxygenation: Pre-oxygenation with 100% oxygen Induction Type: IV induction Ventilation: Mask ventilation without difficulty LMA: LMA inserted LMA Size: 4.0 Number of attempts: 1 Airway Equipment and Method: Bite block Placement Confirmation: positive ETCO2, breath sounds checked- equal and bilateral and CO2 detector Tube secured with: Tape Dental Injury: Teeth and Oropharynx as per pre-operative assessment

## 2020-11-21 NOTE — Transfer of Care (Signed)
Immediate Anesthesia Transfer of Care Note  Patient: Mercedes Valdez  Procedure(s) Performed: Procedure(s) (LRB): DILATATION & CURETTAGE/HYSTEROSCOPY WITH MYOSURE (N/A)  Patient Location: PACU  Anesthesia Type: General  Level of Consciousness: awake, sedated, patient cooperative and responds to stimulation  Airway & Oxygen Therapy: Patient Spontanous Breathing and Patient connected to [place] 02 and soft FM   Post-op Assessment: Report given to PACU RN, Post -op Vital signs reviewed and stable and Patient moving all extremities  Post vital signs: Reviewed and stable  Complications: No apparent anesthesia complications

## 2020-11-21 NOTE — H&P (Signed)
Obstetrics & Gynecology Surgical H&P   Date of Surgery: 11/21/2020    Primary OBGYN: [facility]  Reason for Admission: scheduled surgery  History of Present Illness: Mercedes Valdez is a 25 y.o. G1P0010 (Patient's last menstrual period was 08/01/2020.), with the above CC. PMHx is significant for recent molar pregnancy.    Patient had u/s on 6/15 for to ensure everything had passed with her miscarriage and it showed a likely 2cm endometrial polyp; pt currently denies any AUB.   ROS: A 12-point review of systems was performed and negative, except as stated in the above HPI.  OBGYN History: As per HPI. OB History  Gravida Para Term Preterm AB Living  1       1    SAB IAB Ectopic Multiple Live Births  0            # Outcome Date GA Lbr Len/2nd Weight Sex Delivery Anes PTL Lv  1 Molar 09/2020 [redacted]w[redacted]d          Obstetric Comments  G1: possible  molar pregnancy at 5wks based on path, SAB     Past Medical History: Past Medical History:  Diagnosis Date   ADHD (attention deficit hyperactivity disorder)    Elevated blood pressure reading without diagnosis of hypertension    Endometrial mass    History of 2019 novel coronavirus disease (COVID-19) 03/09/2020   positive result in epic,  per pt mild to moderate symptoms that resolved   History of asthma    child   History of gonorrhea 2016   Insulin dependent type 2 diabetes mellitus ([place]    followd by pcp---- (11-16-2020 per pt checks blood sugar at home 4-5 times daily,  fasting sugar--- 70--95)   PCOS (polycystic ovarian syndrome)    Retinopathy of both eyes    followed by dr zCoralyn Pear--  right eye proliferative w/ macular edema;  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    Past Surgical History: Past Surgical History:  Procedure Laterality Date   KNEE ARTHROSCOPY W/ ACL RECONSTRUCTION Left 2011   TYMPANOSTOMY TUBE PLACEMENT Bilateral    child    Family History:  Family History   Problem Relation Age of Onset   Diabetes Mother    Vision loss Mother    ADD / ADHD Brother    Allergies Brother    Asthma Brother    Vision loss Brother    Cancer Paternal Grandfather        Colon Cancer   Heart disease Father 318      died from MI at age 27   Social History:  Social History   Socioeconomic History   Marital status: Single    Spouse name: Not on file   Number of children: Not on file   Years of education: 11   Highest education level: Not on file  Occupational History   Occupation: WBiochemist, clinical   Comment: RRD  Tobacco Use   Smoking status: Every Day    Years: 6.00    Pack years: 0.00    Types: Cigarettes   Smokeless tobacco: Never   Tobacco comments:    11-16-2020  per pt 3 cig per day  Vaping Use   Vaping Use: Some days   Devices: uses different types  Substance and Sexual Activity   Alcohol use: Not Currently   Drug use: Not Currently    Types: Marijuana   Sexual activity: Yes    Birth control/protection:  None, Injection  Other Topics Concern   Not on file  Social History Narrative   Not on file   Social Determinants of Health   Financial Resource Strain: Not on file  Food Insecurity: No Food Insecurity   Worried About Running Out of Food in the Last Year: Never true   Ran Out of Food in the Last Year: Never true  Transportation Needs: No Transportation Needs   Lack of Transportation (Medical): No   Lack of Transportation (Non-Medical): No  Physical Activity: Not on file  Stress: Not on file  Social Connections: Not on file  Intimate Partner Violence: Not on file     Allergy: Allergies  Allergen Reactions   Amoxicillin Hives   Augmentin [Amoxicillin-Pot Clavulanate] Hives   Penicillins Hives    Has patient had a PCN reaction causing immediate rash, facial/tongue/throat swelling, SOB or lightheadedness with hypotension: No Has patient had a PCN reaction causing severe rash involving mucus membranes or skin necrosis:  No Has patient had a PCN reaction that required hospitalization No Has patient had a PCN reaction occurring within the last 10 years: No If all of the above answers are "NO", then may proceed with Cephalosporin use.   Shellfish Allergy Swelling and Other (See Comments)    Itching lips   Suprax [Cefixime]     Pt unsure reaction, may have been childhood reaction   Latex Rash    Current Outpatient Medications: Medications Prior to Admission  Medication Sig Dispense Refill Last Dose   Accu-Chek Softclix Lancets lancets To check blood sugars 4 times a day. Fasting and 2 hours after breakfast, lunch and dinner. 100 each 8 11/20/2020   glucose blood (ACCU-CHEK GUIDE) test strip To check blood sugars 4 times a day. Fasting, and 2 hours after Breakfast, Lunch and Dinner 100 each 8 11/20/2020   insulin aspart (NOVOLOG) 100 UNIT/ML FlexPen Inject 10 Units into the skin 3 (three) times daily before meals. (Patient taking differently: Inject 10 Units into the skin 3 (three) times daily before meals.) 9 mL 1 11/20/2020   insulin glargine (LANTUS SOLOSTAR) 100 UNIT/ML Solostar Pen Inject 30 Units into the skin daily. (Patient taking differently: Inject 30 Units into the skin at bedtime.) 9 mL 1 Past Week   Insulin Pen Needle (PENTIPS) 32G X 4 MM MISC Use as directed 100 each 3 11/20/2020   loratadine (CLARITIN) 10 MG tablet Take 1 tablet (10 mg total) by mouth daily. 30 tablet 2 11/20/2020   medroxyPROGESTERone Acetate (DEPO-PROVERA IM) Inject into the muscle.   Past Month   prednisoLONE acetate (PRED FORTE) 1 % ophthalmic suspension Place 1 drop into the right eye 4 (four) times daily. 10 mL 0 11/20/2020   Prenatal MV & Min w/FA-DHA (PRENATAL ADULT GUMMY/DHA/FA PO) Take by mouth daily.   Past Week   acetaminophen (TYLENOL) 500 MG tablet Take 500 mg by mouth every 6 (six) hours as needed.   More than a month   Blood Pressure Monitoring (BLOOD PRESSURE KIT) DEVI 1 Device by Does not apply route once a week. Please  take blood pressure once a week and record in [employer]. 1 each 0 Unknown   Misc. Devices (GOJJI WEIGHT SCALE) MISC 1 Device by Does not apply route once a week. Please take weight and record in [employer] once a week. 1 each 0 Unknown     Hospital Medications: Current Facility-Administered Medications  Medication Dose Route Frequency Provider Last Rate Last Admin   acetaminophen (TYLENOL) tablet 1,000 mg  1,000 mg Oral Once Roderic Palau, MD       doxycycline (VIBRAMYCIN) 100 mg in sodium chloride 0.9 % 250 mL IVPB  100 mg Intravenous Once Aletha Halim, MD       lactated ringers infusion   Intravenous Continuous Nolon Nations, MD 50 mL/hr at 11/21/20 1139 New Bag at 11/21/20 1139   lactated ringers infusion   Intravenous Continuous Aletha Halim, MD         Physical Exam:  Current Vital Signs 24h Vital Sign Ranges  T 98 F (36.7 C) Temp  Avg: 98 F (36.7 C)  Min: 98 F (36.7 C)  Max: 98 F (36.7 C)  BP (!) 147/89  BP  Min: 147/89  Max: 147/89  HR 83 Pulse  Avg: 83  Min: 83  Max: 83  RR 18 Resp  Avg: 18  Min: 18  Max: 18  SaO2 100 % Room Air SpO2  Avg: 100 %  Min: 100 %  Max: 100 %       24 Hour I/O Current Shift I/O  Time Ins Outs No intake/output data recorded. No intake/output data recorded.    Body mass index is 39.65 kg/m. General appearance: Well nourished, well developed female in no acute distress.  Cardiovascular: S1, S2 normal, no murmur, rub or gallop, regular rate and rhythm Respiratory:  Clear to auscultation bilateral. Normal respiratory effort Abdomen: positive bowel sounds and no masses, hernias; diffusely non tender to palpation, non distended Neuro/Psych:  Normal mood and affect.  Skin:  Warm and dry.  Extremities: no clubbing, cyanosis, or edema.    Laboratory: UPT: neg CBG: 270 Recent Labs  Lab 11/19/20 1038  WBC 8.1  HGB 12.0  HCT 36.8  PLT 575*   Recent Labs  Lab 11/19/20 1038  NA 139  K 4.2  CL 107  CO2 24  BUN 12   CREATININE 0.55  CALCIUM 9.2  PROT 7.9  BILITOT 0.6  ALKPHOS 65  ALT 20  AST 19  GLUCOSE 261*     Imaging:  Narrative & Impression  CLINICAL DATA:  Molar pregnancy follow-up, miscarriage   EXAM: ULTRASOUND PELVIS TRANSVAGINAL   TECHNIQUE: Transvaginal ultrasound examination of the pelvis was performed including evaluation of the uterus, ovaries, adnexal regions, and pelvic cul-de-sac.   COMPARISON:  10/14/2020   FINDINGS: Uterus   Measurements: 8.0 x 4.5 x 6.2 cm = volume: 116 mL. Anteverted. Heterogeneous myometrium. Small posterior wall intramural leiomyoma 12 x 7 x 10 mm. The   Endometrium   Thickness: 5 mm. Trace endometrial fluid. Nodular focus within endometrial canal 2.4 x 0.9 x 1.5 cm, decreased since previous exam, may represent retained products of conception, retained molar tissue, unlikely to represent hemorrhage since this nodular focus contains internal blood flow on color Doppler imaging.   Right ovary   Measurements: 3.9 x 2.5 x 2.9 cm = volume: 14.6 mL. Normal morphology without mass   Left ovary   Measurements: 4.0 x 2.0 x 1.9 cm = volume: 8.0 mL. Normal morphology without mass   Other findings:  Trace free pelvic fluid.  No adnexal masses.   IMPRESSION: 12 mm intramural leiomyoma posterior upper uterus.   2.4 x 0.9 x 1.5 cm diameter nodule within endometrial canal containing internal vascularity on color Doppler imaging, may represent retained products of conception or retained molar tissue.     Electronically Signed   By: Lavonia Dana M.D.   On: 11/07/2020 13:13    Assessment: Ms. Bentley is a  25 y.o. G1P0010 (Patient's last menstrual period was 08/01/2020.) here for scheduled surgery. Pt stable  Plan: Plan for hysteroscopy, myosure polypectomy. All questions asked and answered. Pt consented.    Durene Romans MD Attending [facility])

## 2020-11-21 NOTE — Anesthesia Postprocedure Evaluation (Signed)
Anesthesia Post Note  Patient: Mercedes Valdez  Procedure(s) Performed: DILATATION & CURETTAGE/HYSTEROSCOPY WITH MYOSURE (Uterus)     Patient location during evaluation: PACU Anesthesia Type: General Level of consciousness: awake and alert Pain management: pain level controlled Vital Signs Assessment: post-procedure vital signs reviewed and stable Respiratory status: spontaneous breathing, nonlabored ventilation and respiratory function stable Cardiovascular status: blood pressure returned to baseline and stable Postop Assessment: no apparent nausea or vomiting Anesthetic complications: no   No notable events documented.  Last Vitals:  Vitals:   11/21/20 1415 11/21/20 1430  BP: (!) 148/89 140/86  Pulse:  84  Resp: 13 16  Temp:    SpO2:  98%    Last Pain:  Vitals:   11/21/20 1400  TempSrc:   PainSc: 0-No pain                 Montreal Steidle,W. EDMOND

## 2020-11-21 NOTE — Brief Op Note (Signed)
11/21/2020  1:19 PM  PATIENT:  Mercedes Valdez  25 y.o. female  PRE-OPERATIVE DIAGNOSIS:  Possible reatined POC versus polyp  POST-OPERATIVE DIAGNOSIS:  1.5-2cm endometrial polyp  PROCEDURE:  Procedure(s): DILATATION & CURETTAGE/HYSTEROSCOPY WITH MYOSURE (N/A)  SURGEON:  Surgeon(s) and Role:    * Aletha Halim, MD - Primary   ASSISTANTS: none   ANESTHESIA:   local and general  EBL:  5 mL   BLOOD ADMINISTERED:none  DRAINS: none   LOCAL MEDICATIONS USED:  LIDOCAINE   SPECIMEN:  Source of Specimen:  endometrial polyp  DISPOSITION OF SPECIMEN:  PATHOLOGY  COUNTS:  YES  TOURNIQUET:  * No tourniquets in log *  DICTATION: .Note written in EPIC  PLAN OF CARE: Discharge to home after PACU  PATIENT DISPOSITION:  PACU - hemodynamically stable.   Delay start of Pharmacological VTE agent (>24hrs) due to surgical blood loss or risk of bleeding: not applicable  Durene Romans MD Attending [facility] 11/21/2020 Time: [phone]

## 2020-11-21 NOTE — Anesthesia Preprocedure Evaluation (Addendum)
Anesthesia Evaluation  Patient identified by MRN, date of birth, ID band Patient awake    Reviewed: Allergy & Precautions, H&P , NPO status , Patient's Chart, lab work & pertinent test results  Airway Mallampati: III  TM Distance: >3 FB Neck ROM: Full    Dental no notable dental hx. (+) Teeth Intact, Dental Advisory Given   Pulmonary asthma , Current Smoker and Patient abstained from smoking.,    Pulmonary exam normal breath sounds clear to auscultation       Cardiovascular negative cardio ROS   Rhythm:Regular Rate:Normal     Neuro/Psych negative neurological ROS  negative psych ROS   GI/Hepatic negative GI ROS, Neg liver ROS,   Endo/Other  diabetes, Type 1, Insulin DependentMorbid obesity  Renal/GU negative Renal ROS  negative genitourinary   Musculoskeletal   Abdominal   Peds  Hematology negative hematology ROS (+)   Anesthesia Other Findings   Reproductive/Obstetrics negative OB ROS                            Anesthesia Physical Anesthesia Plan  ASA: 3  Anesthesia Plan: General   Post-op Pain Management:    Induction: Intravenous  PONV Risk Score and Plan: 3 and Ondansetron, Dexamethasone and Midazolam  Airway Management Planned: LMA  Additional Equipment:   Intra-op Plan:   Post-operative Plan: Extubation in OR  Informed Consent: I have reviewed the patients History and Physical, chart, labs and discussed the procedure including the risks, benefits and alternatives for the proposed anesthesia with the patient or authorized representative who has indicated his/her understanding and acceptance.     Dental advisory given  Plan Discussed with: CRNA  Anesthesia Plan Comments:         Anesthesia Quick Evaluation

## 2020-11-22 LAB — SURGICAL PATHOLOGY

## 2020-11-23 ENCOUNTER — Encounter (HOSPITAL_BASED_OUTPATIENT_CLINIC_OR_DEPARTMENT_OTHER): Payer: Self-pay | Admitting: Obstetrics and Gynecology

## 2020-11-23 ENCOUNTER — Ambulatory Visit (INDEPENDENT_AMBULATORY_CARE_PROVIDER_SITE_OTHER): Payer: Medicaid Other | Admitting: Ophthalmology

## 2020-11-23 ENCOUNTER — Other Ambulatory Visit: Payer: Self-pay

## 2020-11-23 DIAGNOSIS — H35033 Hypertensive retinopathy, bilateral: Secondary | ICD-10-CM

## 2020-11-23 DIAGNOSIS — I10 Essential (primary) hypertension: Secondary | ICD-10-CM

## 2020-11-23 DIAGNOSIS — E113513 Type 2 diabetes mellitus with proliferative diabetic retinopathy with macular edema, bilateral: Secondary | ICD-10-CM

## 2020-11-23 DIAGNOSIS — H3581 Retinal edema: Secondary | ICD-10-CM

## 2020-12-05 NOTE — Progress Notes (Signed)
[facility] Note  12/07/2020     CHIEF COMPLAINT Patient presents for Retina Follow Up   HISTORY OF PRESENT ILLNESS: Mercedes Valdez is a 25 y.o. female who presents to the clinic today for:   HPI     Retina Follow Up   Patient presents with  Diabetic Retinopathy.  In both eyes.  This started 1 week ago.  Severity is severe.  Duration of 1 week.  Since onset it is stable.  I, the attending physician,  performed the HPI with the patient and updated documentation appropriately.        Comments   24 y/o female pt here for 2 wk f/u for PDR OU.  Pt was scheduled for PRP OS today, but was unaware of that, and does not have a driver today.  She will reschedule the laser.  Pt still wants to be seen today, as her VA OD has drastically decreased over the past wk.  Symptom sudden onset; no obvious trigger.  All she can see now is 'a swirl of black and red,' and VA has been like this stably x 1 wk.  No change noticed in [place] OS; VA OS is "good."  Denies pain, FOL.  Has what are possibly floaters OD.  No gtts.  BS 240 this a.m.  A1C 13.0 08/2020.      Last edited by Bernarda Caffey, MD on 12/07/2020  6:35 PM.    Pt states about a week ago, she woke up with very blurry vision OS, she states she has no other symptoms, no high BP, she states it has not gotten any better over the past week  Referring physician: Dr. Anthony Sar [address]  [place],  [address]  HISTORICAL INFORMATION:   Selected notes from the MEDICAL RECORD NUMBER Referred by Dr. Marin Comment LEE: unsure Ocular Hx- decreased vision due to Diabetes PMH- DM    CURRENT MEDICATIONS: Current Outpatient Medications (Ophthalmic Drugs)  Medication Sig   prednisoLONE acetate (PRED FORTE) 1 % ophthalmic suspension Place 1 drop into the right eye 4 (four) times daily.   No current facility-administered medications for this visit. (Ophthalmic Drugs)   Current Outpatient Medications (Other)  Medication Sig    Accu-Chek Softclix Lancets lancets To check blood sugars 4 times a day. Fasting and 2 hours after breakfast, lunch and dinner.   acetaminophen (TYLENOL) 500 MG tablet Take 500 mg by mouth every 6 (six) hours as needed.   Blood Pressure Monitoring (BLOOD PRESSURE KIT) DEVI 1 Device by Does not apply route once a week. Please take blood pressure once a week and record in Babyscripts.   glucose blood (ACCU-CHEK GUIDE) test strip To check blood sugars 4 times a day. Fasting, and 2 hours after Breakfast, Lunch and Dinner   ibuprofen (MOTRIN IB) 200 MG tablet Take 3 tablets (600 mg total) by mouth every 6 (six) hours as needed.   insulin aspart (NOVOLOG) 100 UNIT/ML FlexPen Inject 10 Units into the skin 3 (three) times daily before meals.   insulin glargine (LANTUS SOLOSTAR) 100 UNIT/ML Solostar Pen Inject 30 Units into the skin daily. (Patient taking differently: Inject 30 Units into the skin at bedtime.)   Insulin Pen Needle (PENTIPS) 32G X 4 MM MISC Use as directed   loratadine (CLARITIN) 10 MG tablet Take 1 tablet (10 mg total) by mouth daily.   medroxyPROGESTERone Acetate (DEPO-PROVERA IM) Inject into the muscle.   Misc. Devices (GOJJI WEIGHT SCALE) MISC 1  Device by Does not apply route once a week. Please take weight and record in Babyscripts once a week.   Prenatal MV & Min w/FA-DHA (PRENATAL ADULT GUMMY/DHA/FA PO) Take by mouth daily.   No current facility-administered medications for this visit. (Other)      REVIEW OF SYSTEMS: ROS   Positive for: Endocrine, Eyes, Respiratory Negative for: Constitutional, Gastrointestinal, Neurological, Skin, Genitourinary, Musculoskeletal, HENT, Cardiovascular, Psychiatric, Allergic/Imm, Heme/Lymph Last edited by Matthew Folks, COA on 12/07/2020  1:50 PM.         ALLERGIES Allergies  Allergen Reactions   Amoxicillin Hives   Augmentin [Amoxicillin-Pot Clavulanate] Hives   Penicillins Hives    Has patient had a PCN reaction causing immediate  rash, facial/tongue/throat swelling, SOB or lightheadedness with hypotension: No Has patient had a PCN reaction causing severe rash involving mucus membranes or skin necrosis: No Has patient had a PCN reaction that required hospitalization No Has patient had a PCN reaction occurring within the last 10 years: No If all of the above answers are "NO", then may proceed with Cephalosporin use.   Shellfish Allergy Swelling and Other (See Comments)    Itching lips   Suprax [Cefixime]     Pt unsure reaction, may have been childhood reaction   Latex Rash    PAST MEDICAL HISTORY Past Medical History:  Diagnosis Date   ADHD (attention deficit hyperactivity disorder)    Diabetic retinopathy (HCC)    Elevated blood pressure reading without diagnosis of hypertension    Endometrial mass    History of 2019 novel coronavirus disease (COVID-19) 03/09/2020   positive result in epic,  per pt mild to moderate symptoms that resolved   History of asthma    child   History of gonorrhea 2016   Hypertensive retinopathy    Insulin dependent type 2 diabetes mellitus ([place])    followd by pcp---- (11-16-2020 per pt checks blood sugar at home 4-5 times daily,  fasting sugar--- 70--95)   PCOS (polycystic ovarian syndrome)    Retinopathy of both eyes    followed by dr Coralyn Pear---  right eye proliferative w/ macular edema;  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   Past Surgical History:  Procedure Laterality Date   DILATATION & CURETTAGE/HYSTEROSCOPY WITH MYOSURE N/A 11/21/2020   Procedure: [place];  Surgeon: Aletha Halim, MD;  Location: [facility];  Service: Gynecology;  Laterality: N/A;   KNEE ARTHROSCOPY W/ ACL RECONSTRUCTION Left 2011   TYMPANOSTOMY TUBE PLACEMENT Bilateral    child    FAMILY HISTORY Family History  Problem Relation Age of Onset   Diabetes Mother    Vision loss Mother    ADD / ADHD Brother    Allergies Brother     Asthma Brother    Vision loss Brother    Cancer Paternal Grandfather        Colon Cancer   Heart disease Father 37       died from MI at age 16    SOCIAL HISTORY Social History   Tobacco Use   Smoking status: Every Day    Years: 6.00    Types: Cigarettes   Smokeless tobacco: Never   Tobacco comments:    11-16-2020  per pt 3 cig per day  Vaping Use   Vaping Use: Some days   Devices: uses different types  Substance Use Topics   Alcohol use: Not Currently   Drug use: Not Currently    Types: Marijuana  OPHTHALMIC EXAM:  Base Eye Exam     Visual Acuity (Snellen - Linear)       Right Left   Dist [place] HM 20/20   Dist ph [place] NI          Tonometry (Tonopen, 1:55 PM)       Right Left   Pressure 16 17         Pupils       Dark Light Shape React APD   Right 4 3 Round Minimal None   Left 4 3 Round Minimal None         Visual Fields (Counting fingers)       Left Right    Full    Restrictions  Total superior temporal, inferior temporal, superior nasal, inferior nasal deficiencies         Neuro/Psych     Oriented x3: Yes   Mood/Affect: Normal         Dilation     Right eye: 1.0% Mydriacyl, 2.5% Phenylephrine @ 1:55 PM           Slit Lamp and Fundus Exam     External Exam       Right Left   External Normal Normal         Slit Lamp Exam       Right Left   Lids/Lashes Normal Normal   Conjunctiva/Sclera melanosis melanosis   Cornea trace PEE 1-2+ Punctate epithelial erosions   Anterior Chamber deep and clear, narrow temporal angle deep and clear, narrow temporal angle   Iris round and poorly dilated, slightly reactive, no NVI Round and reactive   Lens 1-2+ coritcal 1-2+ coritcal   Vitreous mild syneresis, +pigment, new blood stained vitreous condensations greatest inferiorly mild syneresis         Fundus Exam       Right Left   Disc +NVD - regressing, +fibrosis, pink and sharp pink and sharp, no NVE   C/D Ratio  0.2    Macula flat, blunted foveal reflex, fibrosis, +NVE -- early regression, ERM  with striae, scattered MA/DBH, +cystic changes flat, good reflex, scattered MA   Vessels Tortuous, Vascular attenuation, +NV, AV crossing changes attenuated, tortuous, AV crossing chagnes   Periphery attached, new pre-retinal heme just above ST arcades, inferior periphery obscured by [facility], +PRP changes attached, scattered MA, CWS about the disc            IMAGING AND PROCEDURES  Imaging and Procedures for 12/07/2020  OCT, Retina - OU - Both Eyes       Right Eye Quality was good. Central Foveal Thickness: 276. Progression has worsened. Findings include abnormal foveal contour, intraretinal fluid, no SRF, epiretinal membrane, intraretinal hyper-reflective material, preretinal fibrosis, vitreous traction, vitreomacular adhesion (Interval development of central cystic changes and interval increase in vitreous opacities).   Left Eye Quality was good. Central Foveal Thickness: 262. Progression has been stable. Findings include normal foveal contour, no SRF, no IRF, intraretinal hyper-reflective material (Trace cystic changes, trace ERM).   Notes *Images captured and stored on drive  Diagnosis / Impression:  OD: Interval development of central cystic changes and interval increase in vitreous opacities OS: NFP, no IRF/SRF; trace cystic changes and trace ERM  Clinical management:  See below  Abbreviations: NFP - Normal foveal profile. CME - cystoid macular edema. PED - pigment epithelial detachment. IRF - intraretinal fluid. SRF - subretinal fluid. EZ - ellipsoid zone. ERM - epiretinal membrane. ORA - outer retinal atrophy. ORT -  outer retinal tubulation. SRHM - subretinal hyper-reflective material. IRHM - intraretinal hyper-reflective material      Intravitreal Injection, Pharmacologic Agent - OD - Right Eye       Time Out 12/07/2020. 2:28 PM. Confirmed correct patient, procedure, site, and patient consented.    Anesthesia Topical anesthesia was used. Anesthetic medications included Lidocaine 2%, Proparacaine 0.5%.   Procedure Preparation included 5% betadine to ocular surface, eyelid speculum. A (32g) needle was used.   Injection: 1.25 mg Bevacizumab 1.60m/0.05ml   Route: Intravitreal, Site: Right Eye   NDC: 50242-060-01, Lot:: [id number] Expiration date: 01/04/2021, Waste: 0.05 mL   Post-op Post injection exam found visual acuity of at least counting fingers. The patient tolerated the procedure well. There were no complications. The patient received written and verbal post procedure care education. Post injection medications were not given.                ASSESSMENT/PLAN:    ICD-10-CM   1. Proliferative diabetic retinopathy of both eyes with macular edema associated with type 2 diabetes mellitus (HCC)  EZ30.8657Intravitreal Injection, Pharmacologic Agent - OD - Right Eye    Bevacizumab (AVASTIN) SOLN 1.25 mg    2. Vitreous hemorrhage, right eye (HCC)  H43.11 Intravitreal Injection, Pharmacologic Agent - OD - Right Eye    Bevacizumab (AVASTIN) SOLN 1.25 mg    3. Retinal edema  H35.81 OCT, Retina - OU - Both Eyes    4. Essential hypertension  I10     5. Hypertensive retinopathy of both eyes  H35.033      1-3. PDR OU (OD > OS) OD with macular edema and new VH; OS without DME - FA (07.01.22) shows +NV OU (OD >> OS); late-leaking MA, vascular nonperfusion -- would benefit from PRP OU - s/p PRP OD (06.17.22) - BCVA OD decreased to HM from 20/150-; OS improved to 20/20 from 20/30 - OCT shows OD: interval development of central cystic changes and interval increase in vitreous opacities - discussed findings and prognosis - [facility] OD [facility] pt wishes to proceed with injection - RBA of procedure discussed, questions answered - IVA informed consent obtained and signed, 07.15.22 (OD) - see procedure note - f/u in 2 wks for DFE/OCT, PRP OS  4,5.  Hypertensive retinopathy OU - discussed importance of tight BP control - monitor  Ophthalmic Meds Ordered this visit:  Meds ordered this encounter  Medications   Bevacizumab (AVASTIN) SOLN 1.25 mg      Return in about 2 weeks (around 12/21/2020) for f/u PDR OU, DFE, OCT, PRP OS.  There are no Patient Instructions on file for this visit.   Explained the diagnoses, plan, and follow up with the patient and they expressed understanding.  Patient expressed understanding of the importance of proper follow up care.   This document serves as a record of services personally performed by BGardiner Sleeper MD, PhD. It was created on their behalf by ASan Jetty BOwens Shark OA an ophthalmic technician. The creation of this record is the provider's dictation and/or activities during the visit.    Electronically signed by: ASan Jetty BMarguerita Merles07.13.2022 6:47 PM  BGardiner Sleeper M.D., Ph.D. Diseases & Surgery of the Retina and Vitreous [facility] I have reviewed the above documentation for accuracy and completeness, and I agree with the above. BGardiner Sleeper M.D., Ph.D. 12/07/20 6:47 PM   Abbreviations: M myopia (nearsighted); A astigmatism; H hyperopia (  farsighted); P presbyopia; Mrx spectacle prescription;  CTL contact lenses; OD right eye; OS left eye; OU both eyes  XT exotropia; ET esotropia; PEK punctate epithelial keratitis; PEE punctate epithelial erosions; DES dry eye syndrome; MGD meibomian gland dysfunction; ATs artificial tears; PFAT's preservative free artificial tears; [place] nuclear sclerotic cataract; PSC posterior subcapsular cataract; ERM epi-retinal membrane; PVD posterior vitreous detachment; RD retinal detachment; DM diabetes mellitus; DR diabetic retinopathy; NPDR non-proliferative diabetic retinopathy; PDR proliferative diabetic retinopathy; CSME clinically significant macular edema; DME diabetic macular edema; dbh dot blot hemorrhages; CWS cotton wool spot; POAG  primary open angle glaucoma; C/D cup-to-disc ratio; HVF humphrey visual field; GVF goldmann visual field; OCT optical coherence tomography; IOP intraocular pressure; BRVO Branch retinal vein occlusion; CRVO central retinal vein occlusion; CRAO central retinal artery occlusion; BRAO branch retinal artery occlusion; RT retinal tear; SB scleral buckle; PPV pars plana vitrectomy; VH Vitreous hemorrhage; PRP panretinal laser photocoagulation; IVK intravitreal kenalog; VMT vitreomacular traction; MH Macular hole;  NVD neovascularization of the disc; NVE neovascularization elsewhere; AREDS age related eye disease study; ARMD age related macular degeneration; POAG primary open angle glaucoma; EBMD epithelial/anterior basement membrane dystrophy; ACIOL anterior chamber intraocular lens; IOL intraocular lens; PCIOL posterior chamber intraocular lens; Phaco/IOL phacoemulsification with intraocular lens placement; Lane photorefractive keratectomy; LASIK laser assisted in situ keratomileusis; HTN hypertension; DM diabetes mellitus; COPD chronic obstructive pulmonary disease

## 2020-12-07 ENCOUNTER — Other Ambulatory Visit: Payer: Self-pay

## 2020-12-07 ENCOUNTER — Encounter (INDEPENDENT_AMBULATORY_CARE_PROVIDER_SITE_OTHER): Payer: Self-pay | Admitting: Ophthalmology

## 2020-12-07 ENCOUNTER — Ambulatory Visit (INDEPENDENT_AMBULATORY_CARE_PROVIDER_SITE_OTHER): Payer: Medicaid Other | Admitting: Ophthalmology

## 2020-12-07 DIAGNOSIS — E113513 Type 2 diabetes mellitus with proliferative diabetic retinopathy with macular edema, bilateral: Secondary | ICD-10-CM

## 2020-12-07 DIAGNOSIS — H3581 Retinal edema: Secondary | ICD-10-CM | POA: Diagnosis not present

## 2020-12-07 DIAGNOSIS — H4311 Vitreous hemorrhage, right eye: Secondary | ICD-10-CM | POA: Diagnosis not present

## 2020-12-07 DIAGNOSIS — I10 Essential (primary) hypertension: Secondary | ICD-10-CM | POA: Diagnosis not present

## 2020-12-07 DIAGNOSIS — H35033 Hypertensive retinopathy, bilateral: Secondary | ICD-10-CM

## 2020-12-07 DIAGNOSIS — E113511 Type 2 diabetes mellitus with proliferative diabetic retinopathy with macular edema, right eye: Secondary | ICD-10-CM

## 2020-12-07 MED ORDER — BEVACIZUMAB CHEMO INJECTION 1.25MG/0.05ML SYRINGE FOR KALEIDOSCOPE
1.2500 mg | INTRAVITREAL | Status: AC | PRN
  Administered 2020-12-07: 1.25 mg via INTRAVITREAL

## 2020-12-12 ENCOUNTER — Ambulatory Visit: Payer: MEDICAID

## 2020-12-18 NOTE — Progress Notes (Signed)
[facility] Note  12/21/2020     CHIEF COMPLAINT Patient presents for Retina Follow Up   HISTORY OF PRESENT ILLNESS: Mercedes Valdez is a 25 y.o. female who presents to the clinic today for:   HPI     Retina Follow Up   Patient presents with  Diabetic Retinopathy.  In both eyes.  Duration of 2 weeks.  Since onset it is gradually improving.  I, the attending physician,  performed the HPI with the patient and updated documentation appropriately.        Comments   2 week follow up PRP OS today for PDR OU-  Patient states she can almost see out of OD.  No changes with OS.  BS 242 this am A1C 13      Last edited by Bernarda Caffey, MD on 12/21/2020 12:33 PM.     Pt states about a week ago, she woke up with very blurry vision OS, she states she has no other symptoms, no high BP, she states it has not gotten any better over the past week  Referring physician: Dr. Anthony Sar [address]  [place], [place] [address]  HISTORICAL INFORMATION:   Selected notes from the MEDICAL RECORD NUMBER Referred by Dr. Marin Comment LEE: unsure Ocular Hx- decreased vision due to Diabetes PMH- DM    CURRENT MEDICATIONS: Current Outpatient Medications (Ophthalmic Drugs)  Medication Sig   prednisoLONE acetate (PRED FORTE) 1 % ophthalmic suspension Place 1 drop into the left eye 4 (four) times daily for 7 days.   prednisoLONE acetate (PRED FORTE) 1 % ophthalmic suspension Place 1 drop into the right eye 4 (four) times daily. (Patient not taking: Reported on 12/21/2020)   No current facility-administered medications for this visit. (Ophthalmic Drugs)   Current Outpatient Medications (Other)  Medication Sig   acetaminophen (TYLENOL) 500 MG tablet Take 500 mg by mouth every 6 (six) hours as needed.   insulin aspart (NOVOLOG) 100 UNIT/ML FlexPen Inject 10 Units into the skin 3 (three) times daily before meals.   insulin glargine (LANTUS SOLOSTAR) 100 UNIT/ML Solostar Pen Inject 30  Units into the skin daily. (Patient taking differently: Inject 30 Units into the skin at bedtime.)   loratadine (CLARITIN) 10 MG tablet Take 1 tablet (10 mg total) by mouth daily.   medroxyPROGESTERone Acetate (DEPO-PROVERA IM) Inject into the muscle.   Prenatal MV & Min w/FA-DHA (PRENATAL ADULT GUMMY/DHA/FA PO) Take by mouth daily.   Accu-Chek Softclix Lancets lancets To check blood sugars 4 times a day. Fasting and 2 hours after breakfast, lunch and dinner.   Blood Pressure Monitoring (BLOOD PRESSURE KIT) DEVI 1 Device by Does not apply route once a week. Please take blood pressure once a week and record in Babyscripts.   glucose blood (ACCU-CHEK GUIDE) test strip To check blood sugars 4 times a day. Fasting, and 2 hours after Breakfast, Lunch and Dinner   ibuprofen (MOTRIN IB) 200 MG tablet Take 3 tablets (600 mg total) by mouth every 6 (six) hours as needed.   Insulin Pen Needle (PENTIPS) 32G X 4 MM MISC Use as directed   Misc. Devices (GOJJI WEIGHT SCALE) MISC 1 Device by Does not apply route once a week. Please take weight and record in Babyscripts once a week.   No current facility-administered medications for this visit. (Other)      REVIEW OF SYSTEMS: ROS   Positive for: Endocrine, Eyes, Respiratory Negative for: Constitutional, Gastrointestinal, Neurological, Skin, Genitourinary, Musculoskeletal,  HENT, Cardiovascular, Psychiatric, Allergic/Imm, Heme/Lymph Last edited by Leonie Douglas, COA on 12/21/2020  7:55 AM.          ALLERGIES Allergies  Allergen Reactions   Amoxicillin Hives   Augmentin [Amoxicillin-Pot Clavulanate] Hives   Penicillins Hives    Has patient had a PCN reaction causing immediate rash, facial/tongue/throat swelling, SOB or lightheadedness with hypotension: No Has patient had a PCN reaction causing severe rash involving mucus membranes or skin necrosis: No Has patient had a PCN reaction that required hospitalization No Has patient had a PCN reaction  occurring within the last 10 years: No If all of the above answers are "NO", then may proceed with Cephalosporin use.   Shellfish Allergy Swelling and Other (See Comments)    Itching lips   Suprax [Cefixime]     Pt unsure reaction, may have been childhood reaction   Latex Rash    PAST MEDICAL HISTORY Past Medical History:  Diagnosis Date   ADHD (attention deficit hyperactivity disorder)    Diabetic retinopathy (HCC)    Elevated blood pressure reading without diagnosis of hypertension    Endometrial mass    History of 2019 novel coronavirus disease (COVID-19) 03/09/2020   positive result in epic,  per pt mild to moderate symptoms that resolved   History of asthma    child   History of gonorrhea 2016   Hypertensive retinopathy    Insulin dependent type 2 diabetes mellitus ([place])    followd by pcp---- (11-16-2020 per pt checks blood sugar at home 4-5 times daily,  fasting sugar--- 70--95)   PCOS (polycystic ovarian syndrome)    Retinopathy of both eyes    followed by dr Coralyn Pear---  right eye proliferative w/ macular edema;  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   Past Surgical History:  Procedure Laterality Date   DILATATION & CURETTAGE/HYSTEROSCOPY WITH MYOSURE N/A 11/21/2020   Procedure: [place];  Surgeon: Aletha Halim, MD;  Location: [facility];  Service: Gynecology;  Laterality: N/A;   KNEE ARTHROSCOPY W/ ACL RECONSTRUCTION Left 2011   TYMPANOSTOMY TUBE PLACEMENT Bilateral    child    FAMILY HISTORY Family History  Problem Relation Age of Onset   Diabetes Mother    Vision loss Mother    ADD / ADHD Brother    Allergies Brother    Asthma Brother    Vision loss Brother    Cancer Paternal Grandfather        Colon Cancer   Heart disease Father 92       died from MI at age 27    SOCIAL HISTORY Social History   Tobacco Use   Smoking status: Every Day    Years: 6.00    Types: Cigarettes    Smokeless tobacco: Never   Tobacco comments:    11-16-2020  per pt 3 cig per day  Vaping Use   Vaping Use: Some days   Devices: uses different types  Substance Use Topics   Alcohol use: Not Currently   Drug use: Not Currently    Types: Marijuana         OPHTHALMIC EXAM:  Base Eye Exam     Visual Acuity (Snellen - Linear)       Right Left   Dist [place] CF 1' 20/20   Dist ph [place] NI          Tonometry (Tonopen, 8:02 AM)       Right Left   Pressure 17 19  Pupils       Dark Light Shape React APD   Right 4 3 Round Brisk None   Left 4 3 Round Brisk None         Visual Fields (Counting fingers)       Left Right    Full    Restrictions  Partial inner superior temporal, inferior temporal, superior nasal, inferior nasal deficiencies         Extraocular Movement       Right Left    Full Full         Neuro/Psych     Oriented x3: Yes   Mood/Affect: Normal         Dilation     Left eye: 1.0% Mydriacyl, 2.5% Phenylephrine @ 8:02 AM           Slit Lamp and Fundus Exam     External Exam       Right Left   External Normal Normal         Slit Lamp Exam       Right Left   Lids/Lashes Normal Normal   Conjunctiva/Sclera melanosis melanosis   Cornea trace PEE 1-2+ Punctate epithelial erosions   Anterior Chamber deep and clear, narrow temporal angle deep and clear, narrow temporal angle   Iris round and poorly dilated, slightly reactive, no NVI Round and reactive   Lens 1-2+ coritcal 1-2+ coritcal   Vitreous mild syneresis, +pigment, new blood stained vitreous condensations greatest inferiorly mild syneresis         Fundus Exam       Right Left   Disc +NVD - regressing, +fibrosis, pink and sharp pink and sharp, no NVE   C/D Ratio  0.2   Macula flat, blunted foveal reflex, fibrosis, +NVE -- early regression, ERM  with striae, scattered MA/DBH, +cystic changes flat, good reflex, scattered MA   Vessels Tortuous, Vascular attenuation,  +NV, AV crossing changes attenuated, tortuous, AV crossing chagnes   Periphery attached, new pre-retinal heme just above ST arcades, inferior periphery obscured by [facility], +PRP changes attached, scattered MA, CWS about the disc            IMAGING AND PROCEDURES  Imaging and Procedures for 12/21/2020  OCT, Retina - OU - Both Eyes       Right Eye Quality was good. Central Foveal Thickness: 320. Progression has worsened. Findings include abnormal foveal contour, intraretinal fluid, no SRF, epiretinal membrane, intraretinal hyper-reflective material, preretinal fibrosis, vitreous traction, vitreomacular adhesion (Interval increase in central cystic changes and preretinal fibrosis; mild interval improvement in vitreous opacities).   Left Eye Quality was good. Central Foveal Thickness: 262. Progression has been stable. Findings include normal foveal contour, no SRF, no IRF, intraretinal hyper-reflective material (Trace cystic changes, trace ERM).   Notes *Images captured and stored on drive  Diagnosis / Impression:  OD: Interval development of central cystic changes and interval increase in vitreous opacities OS: NFP, no IRF/SRF; trace cystic changes and trace ERM  Clinical management:  See below  Abbreviations: NFP - Normal foveal profile. CME - cystoid macular edema. PED - pigment epithelial detachment. IRF - intraretinal fluid. SRF - subretinal fluid. EZ - ellipsoid zone. ERM - epiretinal membrane. ORA - outer retinal atrophy. ORT - outer retinal tubulation. SRHM - subretinal hyper-reflective material. IRHM - intraretinal hyper-reflective material      Panretinal Photocoagulation - OS - Left Eye       Time Out Confirmed correct patient, procedure, site, and patient consented.  Anesthesia Topical anesthesia was used. Anesthetic medications included Proparacaine 0.5%.   Post-op The patient tolerated the procedure well. There were no complications. The patient received written and  verbal post procedure care education.   Notes LASER PROCEDURE NOTE  Diagnosis:   Proliferative Diabetic Retinopathy, LEFT EYE  Procedure:  Pan-retinal photocoagulation using slit lamp laser, LEFT EYE  Anesthesia:  Topical  Surgeon: Bernarda Caffey, MD, PhD   Informed consent obtained, operative eye marked, and time out performed prior to initiation of laser.   Lumenis [id number] slit lamp laser Pattern: 3x3 square Power: 260 mW Duration: 30 msec  Spot size: 200 microns  # spots: 0174 spots  Complications: None.  RTC: 2 wks -- DFE/OCT, possible injection  Patient tolerated the procedure well and received written and verbal post-procedure care information/education.            ASSESSMENT/PLAN:    ICD-10-CM   1. Proliferative diabetic retinopathy of both eyes with macular edema associated with type 2 diabetes mellitus ([place])  B44.9675 Panretinal Photocoagulation - OS - Left Eye    2. Vitreous hemorrhage, right eye (HCC)  H43.11     3. Retinal edema  H35.81 OCT, Retina - OU - Both Eyes    4. Essential hypertension  I10     5. Hypertensive retinopathy of both eyes  H35.033      1-3. PDR OU (OD > OS) OD with macular edema and new VH; OS without DME - s/p IVA OD #1 (07.15.22) - FA (07.01.22) shows +NV OU (OD >> OS); late-leaking MA, vascular nonperfusion -- would benefit from PRP OU - s/p PRP OD (06.17.22) - BCVA OD CF from HM ; OS 20/20 - OCT shows OD: interval inc in central cystic changes and preretinal fibrosis; interval improvement in vitreous opacities - discussed findings and prognosis -- may need PPV OD - recommend PRP OS for PDR today, 7.29.22 - pt wishes to proceed with PRP OS - RBA of procedure discussed, questions answered - Begin PF QID OS x 7 days - see procedure note - f/u 2 wks - DFE/OCT, possible injection  4,5. Hypertensive retinopathy OU - discussed importance of tight BP control - monitor  Ophthalmic Meds Ordered this visit:  Meds ordered  this encounter  Medications   prednisoLONE acetate (PRED FORTE) 1 % ophthalmic suspension    Sig: Place 1 drop into the left eye 4 (four) times daily for 7 days.    Dispense:  10 mL    Refill:  0       Return in about 2 weeks (around 01/04/2021) for PDR OU -- , Dilated Exam, OCT, Possible Injxn.  There are no Patient Instructions on file for this visit.  This document serves as a record of services personally performed by Gardiner Sleeper, MD, PhD. It was created on their behalf by Leeann Must, [place], an ophthalmic technician. The creation of this record is the provider's dictation and/or activities during the visit.    Electronically signed by: Leeann Must, COA @TODAY @ 12:37 PM  Gardiner Sleeper, M.D., Ph.D. Diseases & Surgery of the Retina and [place] 12/21/2020   I have reviewed the above documentation for accuracy and completeness, and I agree with the above. Gardiner Sleeper, M.D., Ph.D. 12/21/20 12:37 PM   Abbreviations: M myopia (nearsighted); A astigmatism; H hyperopia (farsighted); P presbyopia; Mrx spectacle prescription;  CTL contact lenses; OD right eye; OS left eye; OU both eyes  XT exotropia; ET esotropia;  PEK punctate epithelial keratitis; PEE punctate epithelial erosions; DES dry eye syndrome; MGD meibomian gland dysfunction; ATs artificial tears; PFAT's preservative free artificial tears; [place] nuclear sclerotic cataract; PSC posterior subcapsular cataract; ERM epi-retinal membrane; PVD posterior vitreous detachment; RD retinal detachment; DM diabetes mellitus; DR diabetic retinopathy; NPDR non-proliferative diabetic retinopathy; PDR proliferative diabetic retinopathy; CSME clinically significant macular edema; DME diabetic macular edema; dbh dot blot hemorrhages; CWS cotton wool spot; POAG primary open angle glaucoma; C/D cup-to-disc ratio; HVF humphrey visual field; GVF goldmann visual field; OCT optical coherence tomography; IOP  intraocular pressure; BRVO Branch retinal vein occlusion; CRVO central retinal vein occlusion; CRAO central retinal artery occlusion; BRAO branch retinal artery occlusion; RT retinal tear; SB scleral buckle; PPV pars plana vitrectomy; VH Vitreous hemorrhage; PRP panretinal laser photocoagulation; IVK intravitreal kenalog; VMT vitreomacular traction; MH Macular hole;  NVD neovascularization of the disc; NVE neovascularization elsewhere; AREDS age related eye disease study; ARMD age related macular degeneration; POAG primary open angle glaucoma; EBMD epithelial/anterior basement membrane dystrophy; ACIOL anterior chamber intraocular lens; IOL intraocular lens; PCIOL posterior chamber intraocular lens; Phaco/IOL phacoemulsification with intraocular lens placement; [place] photorefractive keratectomy; LASIK laser assisted in situ keratomileusis; HTN hypertension; DM diabetes mellitus; COPD chronic obstructive pulmonary disease

## 2020-12-21 ENCOUNTER — Encounter (INDEPENDENT_AMBULATORY_CARE_PROVIDER_SITE_OTHER): Payer: Self-pay | Admitting: Ophthalmology

## 2020-12-21 ENCOUNTER — Ambulatory Visit (INDEPENDENT_AMBULATORY_CARE_PROVIDER_SITE_OTHER): Payer: Medicaid Other | Admitting: Ophthalmology

## 2020-12-21 ENCOUNTER — Other Ambulatory Visit: Payer: Self-pay

## 2020-12-21 DIAGNOSIS — E113412 Type 2 diabetes mellitus with severe nonproliferative diabetic retinopathy with macular edema, left eye: Secondary | ICD-10-CM

## 2020-12-21 DIAGNOSIS — E113511 Type 2 diabetes mellitus with proliferative diabetic retinopathy with macular edema, right eye: Secondary | ICD-10-CM

## 2020-12-21 DIAGNOSIS — H35033 Hypertensive retinopathy, bilateral: Secondary | ICD-10-CM

## 2020-12-21 DIAGNOSIS — H3581 Retinal edema: Secondary | ICD-10-CM

## 2020-12-21 DIAGNOSIS — I10 Essential (primary) hypertension: Secondary | ICD-10-CM | POA: Diagnosis not present

## 2020-12-21 DIAGNOSIS — E113513 Type 2 diabetes mellitus with proliferative diabetic retinopathy with macular edema, bilateral: Secondary | ICD-10-CM

## 2020-12-21 DIAGNOSIS — H4311 Vitreous hemorrhage, right eye: Secondary | ICD-10-CM | POA: Diagnosis not present

## 2020-12-21 MED ORDER — PREDNISOLONE ACETATE 1 % OP SUSP: 1.0000 [drp] | Freq: Four times a day (QID) | OPHTHALMIC | 0 refills | Status: AC

## 2020-12-24 ENCOUNTER — Other Ambulatory Visit (HOSPITAL_COMMUNITY)
Admission: RE | Admit: 2020-12-24 | Discharge: 2020-12-24 | Disposition: A | Discharge status: Home / Self Care | Payer: Medicaid Other | Source: Ambulatory Visit | Attending: Obstetrics and Gynecology | Admitting: Obstetrics and Gynecology

## 2020-12-24 ENCOUNTER — Ambulatory Visit (INDEPENDENT_AMBULATORY_CARE_PROVIDER_SITE_OTHER): Payer: Medicaid Other | Admitting: Obstetrics and Gynecology

## 2020-12-24 ENCOUNTER — Other Ambulatory Visit: Payer: Self-pay

## 2020-12-24 ENCOUNTER — Encounter: Payer: Self-pay | Admitting: Obstetrics and Gynecology

## 2020-12-24 VITALS — BP 127/85 | HR 97 | Ht 65.0 in | Wt 224.4 lb

## 2020-12-24 DIAGNOSIS — O020 Blighted ovum and nonhydatidiform mole: Secondary | ICD-10-CM | POA: Diagnosis not present

## 2020-12-24 DIAGNOSIS — N939 Abnormal uterine and vaginal bleeding, unspecified: Secondary | ICD-10-CM | POA: Diagnosis present

## 2020-12-24 DIAGNOSIS — B373 Candidiasis of vulva and vagina: Secondary | ICD-10-CM

## 2020-12-24 DIAGNOSIS — N921 Excessive and frequent menstruation with irregular cycle: Secondary | ICD-10-CM

## 2020-12-24 DIAGNOSIS — Z124 Encounter for screening for malignant neoplasm of cervix: Secondary | ICD-10-CM | POA: Diagnosis not present

## 2020-12-24 DIAGNOSIS — B3731 Acute candidiasis of vulva and vagina: Secondary | ICD-10-CM

## 2020-12-24 MED ORDER — TRANEXAMIC ACID 650 MG PO TABS: 1300.0000 mg | ORAL_TABLET | Freq: Three times a day (TID) | ORAL | 0 refills | Status: AC

## 2020-12-24 MED ORDER — MICONAZOLE NITRATE 2 % VA CREA: 1.0000 | TOPICAL_CREAM | VAGINAL | 2 refills | Status: DC

## 2020-12-24 MED ORDER — METRONIDAZOLE 500 MG PO TABS: 500.0000 mg | ORAL_TABLET | Freq: Two times a day (BID) | ORAL | 0 refills | Status: AC

## 2020-12-24 MED ORDER — FLUCONAZOLE 150 MG PO TABS: 150.0000 mg | ORAL_TABLET | ORAL | 0 refills | Status: AC

## 2020-12-24 NOTE — Progress Notes (Signed)
Obstetrics and Gynecology Visit Return Patient Evaluation  Appointment Date: 12/24/2020  OBGYN Clinic: [facility] for [facility]  Chief Complaint: Routine post op check  History of Present Illness:  Mercedes Valdez is a 25 y.o. 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 She received depo provera on 11/02/2020.  +vaginal/vulvar irritation. Pt states last a1c was 13.   Review of Systems: as noted in the History of Present Illness.  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 [name] had no medications administered during this visit. Current Outpatient Medications  Medication Sig Dispense Refill   Accu-Chek Softclix Lancets lancets To check blood sugars 4 times a day. Fasting and 2 hours after breakfast, lunch and dinner. 100 each 8   acetaminophen (TYLENOL) 500 MG tablet Take 500 mg by mouth every 6 (six) hours as needed.     Blood Pressure Monitoring (BLOOD PRESSURE KIT) DEVI 1 Device by Does not apply route once a week. Please take blood pressure once a week and record in Babyscripts. 1 each 0   glucose blood (ACCU-CHEK GUIDE) test strip To check blood sugars 4 times a day. Fasting, and 2 hours  after Breakfast, Lunch and Dinner 100 each 8   ibuprofen (MOTRIN IB) 200 MG tablet Take 3 tablets (600 mg total) by mouth every 6 (six) hours as needed. 30 tablet 0   insulin aspart (NOVOLOG) 100 UNIT/ML FlexPen Inject 10 Units into the skin 3 (three) times daily before meals. 9 mL 1   insulin glargine (LANTUS SOLOSTAR) 100 UNIT/ML Solostar Pen Inject 30 Units into the skin daily. (Patient taking differently: Inject 30 Units into the skin at bedtime.) 9 mL 1   Insulin Pen Needle (PENTIPS) 32G X 4 MM MISC Use as directed 100 each 3   loratadine (CLARITIN) 10 MG tablet Take 1 tablet (10 mg total) by mouth daily. 30 tablet 2   medroxyPROGESTERone Acetate (DEPO-PROVERA IM) Inject into the muscle.     prednisoLONE acetate (PRED FORTE) 1 % ophthalmic suspension Place 1 drop into the right eye 4 (four) times daily. 10 mL 0   prednisoLONE acetate (PRED FORTE) 1 % ophthalmic suspension Place 1 drop into the left eye 4 (four) times daily for 7 days. 10 mL 0   Misc. Devices (GOJJI WEIGHT SCALE) MISC 1 Device by Does not apply route once a week. Please take weight and record in Babyscripts once a week. 1 each 0   Prenatal MV & Min w/FA-DHA (PRENATAL ADULT GUMMY/DHA/FA PO) Take by mouth daily. (Patient not taking: Reported on 12/24/2020)     No current facility-administered medications for this visit.    Allergies: is allergic to amoxicillin, augmentin [amoxicillin-pot clavulanate], penicillins, shellfish allergy, suprax [cefixime], and latex.  Physical Exam:  BP 127/85   Pulse  97   Ht 5' 5"  (1.651 m)   Wt 224 lb 6.4 oz (101.8 kg)   BMI 37.34 kg/m  Body mass index is 37.34 kg/m. General appearance: Well nourished, well developed female in no acute distress.  Abdomen: diffusely non tender to palpation, non distended, and no masses, hernias Neuro/Psych:  Normal mood and affect.    Pelvic exam:  EGBUS: moderately severe irritation and erythema with some open creases Vagina: small amount of dark, old  blood, no active bleeding Cervix: normal, nttp Uterus: small, nttp   Assessment: pt stable  Plan:  1. Abnormal uterine bleeding (AUB) Likely depo related. Will try lysteda course; pt to let me know if this doesn't help in order to try something else - Cytology - PAP( [place]) - Cervicovaginal ancillary only( [place])  2. Cervical cancer screening - Cytology - PAP( [place])  3. Molar pregnancy - Beta hCG quant (ref lab)   RTC: 55monthf/u, for another possible depo and repeat hcg  CDurene RomansMD Attending [facility]

## 2020-12-24 NOTE — Progress Notes (Deleted)
11/02/2020 depo ***  A1c 13, yeast ***  Obstetrics and Gynecology Visit Return Patient Evaluation  Appointment Date: 12/24/2020  Primary Care Provider: Patient, No Pcp Per (Inactive)  OBGYN Clinic: [facility] for [place]-***  Chief Complaint: ***  History of Present Illness:  LANGLEY FLATLEY is a 25 y.o. ***  Interval History: Since that time, she states that ***  Review of Systems: Positive for ***.    Otherwise, her 12 point review of systems is negative or as noted in the History of Present Illness.  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 [place] had no medications administered during this visit. Current Outpatient Medications  Medication Sig Dispense Refill   Accu-Chek Softclix Lancets lancets To check blood sugars 4 times a day. Fasting and 2 hours after breakfast, lunch and dinner. 100 each 8   acetaminophen (TYLENOL) 500 MG tablet Take 500 mg by mouth every 6 (six) hours as needed.     Blood Pressure Monitoring (BLOOD PRESSURE KIT) DEVI 1 Device by Does not apply route once a week. Please take blood pressure once a week and record in Babyscripts. 1 each 0   glucose blood (ACCU-CHEK GUIDE) test strip To check blood sugars 4 times a day. Fasting, and 2 hours after Breakfast, Lunch and Dinner 100 each 8   ibuprofen (MOTRIN IB) 200 MG tablet Take 3 tablets (600 mg total) by mouth  every 6 (six) hours as needed. 30 tablet 0   insulin aspart (NOVOLOG) 100 UNIT/ML FlexPen Inject 10 Units into the skin 3 (three) times daily before meals. 9 mL 1   insulin glargine (LANTUS SOLOSTAR) 100 UNIT/ML Solostar Pen Inject 30 Units into the skin daily. (Patient taking differently: Inject 30 Units into the skin at bedtime.) 9 mL 1   Insulin Pen Needle (PENTIPS) 32G X 4 MM MISC Use as directed 100 each 3   loratadine (CLARITIN) 10 MG tablet Take 1 tablet (10 mg total) by mouth daily. 30 tablet 2   medroxyPROGESTERone Acetate (DEPO-PROVERA IM) Inject into the muscle.     prednisoLONE acetate (PRED FORTE) 1 % ophthalmic suspension Place 1 drop into the right eye 4 (four) times daily. 10 mL 0   prednisoLONE acetate (PRED FORTE) 1 % ophthalmic suspension Place 1 drop into the left eye 4 (four) times daily for 7 days. 10 mL 0   Misc. Devices (GOJJI WEIGHT SCALE) MISC 1 Device by Does not apply route once a week. Please take weight and record in Babyscripts once a week. 1 each 0   Prenatal MV & Min w/FA-DHA (PRENATAL ADULT GUMMY/DHA/FA PO) Take by mouth daily. (Patient not taking: Reported on 12/24/2020)     No current facility-administered medications for this visit.    Allergies: is allergic to amoxicillin, augmentin [amoxicillin-pot clavulanate], penicillins, shellfish allergy, suprax [cefixime], and latex.  Physical Exam:  BP 127/85   Pulse 97   Ht _0  (1.651 m)   Wt 224 lb 6.4 oz (  101.8 kg)   BMI 37.34 kg/m  Body mass index is 37.34 kg/m. General appearance: Well nourished, well developed female in no acute distress.  Abdomen: diffusely non tender to palpation, non distended, and no masses, hernias Neuro/Psych:  Normal mood and affect.    Pelvic exam:  EGBUS, vaginal vault and cervix: within normal limits   Assessment: ***  Plan:  1. Abnormal uterine bleeding (AUB) *** - Cytology - PAP( [place]) - Cervicovaginal ancillary only( [place])  2. Cervical cancer  screening *** - Cytology - PAP( [place])  3. Molar pregnancy *** - Beta hCG quant (ref lab)   RTC: ***  Durene Romans MD Attending Center for [facility])

## 2020-12-25 LAB — CERVICOVAGINAL ANCILLARY ONLY
Bacterial Vaginitis (gardnerella): NEGATIVE
Candida Glabrata: NEGATIVE
Candida Vaginitis: POSITIVE — AB
Chlamydia: NEGATIVE
Comment: NEGATIVE
Comment: NEGATIVE
Comment: NEGATIVE
Comment: NEGATIVE
Comment: NEGATIVE
Comment: NORMAL
Neisseria Gonorrhea: NEGATIVE
Trichomonas: NEGATIVE

## 2020-12-25 LAB — BETA HCG QUANT (REF LAB): hCG Quant: <1 m[IU]/mL

## 2020-12-26 LAB — CYTOLOGY - PAP: Diagnosis: NEGATIVE

## 2021-01-03 NOTE — Progress Notes (Signed)
[facility] Note  01/04/2021     CHIEF COMPLAINT Patient presents for Retina Follow Up   HISTORY OF PRESENT ILLNESS: Mercedes Valdez is a 25 y.o. female who presents to the clinic today for:   HPI     Retina Follow Up   Patient presents with  Diabetic Retinopathy.  In both eyes.  Severity is severe.  Duration of 2 weeks.  Since onset it is stable.  I, the attending physician,  performed the HPI with the patient and updated documentation appropriately.        Comments   Patient slightly worse OD over the past several months. No change in vision OS. Hasn't checked BS recently. Last a1c was 13.2, checked 3 months ago.       Last edited by Bernarda Caffey, MD on 01/04/2021 12:26 PM.    Patient states worse OD over the past several months. No change in vision OS. Last a1c was 13.2, checked 3 months ago.   Referring physician: Dr. Anthony Sar [phone] [place], [place] [address]  HISTORICAL INFORMATION:   Selected notes from the MEDICAL RECORD NUMBER Referred by Dr. Marin Comment LEE: unsure Ocular Hx- decreased vision due to Diabetes PMH- DM    CURRENT MEDICATIONS: Current Outpatient Medications (Ophthalmic Drugs)  Medication Sig   prednisoLONE acetate (PRED FORTE) 1 % ophthalmic suspension Place 1 drop into the right eye 4 (four) times daily. (Patient not taking: Reported on 01/04/2021)   No current facility-administered medications for this visit. (Ophthalmic Drugs)   Current Outpatient Medications (Other)  Medication Sig   Accu-Chek Softclix Lancets lancets To check blood sugars 4 times a day. Fasting and 2 hours after breakfast, lunch and dinner.   Blood Pressure Monitoring (BLOOD PRESSURE KIT) DEVI 1 Device by Does not apply route once a week. Please take blood pressure once a week and record in Babyscripts.   glucose blood (ACCU-CHEK GUIDE) test strip To check blood sugars 4 times a day. Fasting, and 2 hours after Breakfast, Lunch and Dinner    ibuprofen (MOTRIN IB) 200 MG tablet Take 3 tablets (600 mg total) by mouth every 6 (six) hours as needed.   insulin aspart (NOVOLOG) 100 UNIT/ML FlexPen Inject 10 Units into the skin 3 (three) times daily before meals.   insulin glargine (LANTUS SOLOSTAR) 100 UNIT/ML Solostar Pen Inject 30 Units into the skin daily. (Patient taking differently: Inject 30 Units into the skin at bedtime.)   Insulin Pen Needle (PENTIPS) 32G X 4 MM MISC Use as directed   loratadine (CLARITIN) 10 MG tablet Take 1 tablet (10 mg total) by mouth daily.   medroxyPROGESTERone Acetate (DEPO-PROVERA IM) Inject into the muscle.   miconazole (MONISTAT 7) 2 % vaginal cream Place 1 Applicatorful vaginally 2 (two) times a week. Start applying after you finish the diflucan pills   Misc. Devices (GOJJI WEIGHT SCALE) MISC 1 Device by Does not apply route once a week. Please take weight and record in Babyscripts once a week.   acetaminophen (TYLENOL) 500 MG tablet Take 500 mg by mouth every 6 (six) hours as needed.   Prenatal MV & Min w/FA-DHA (PRENATAL ADULT GUMMY/DHA/FA PO) Take by mouth daily. (Patient not taking: Reported on 01/04/2021)   No current facility-administered medications for this visit. (Other)   REVIEW OF SYSTEMS: ROS   Positive for: Endocrine, Eyes, Respiratory Negative for: Constitutional, Gastrointestinal, Neurological, Skin, Genitourinary, Musculoskeletal, HENT, Cardiovascular, Psychiatric, Allergic/Imm, Heme/Lymph Last edited by Jobe Marker,  COT on 01/04/2021  9:44 AM.     ALLERGIES Allergies  Allergen Reactions   Amoxicillin Hives   Augmentin [Amoxicillin-Pot Clavulanate] Hives   Penicillins Hives    Has patient had a PCN reaction causing immediate rash, facial/tongue/throat swelling, SOB or lightheadedness with hypotension: No Has patient had a PCN reaction causing severe rash involving mucus membranes or skin necrosis: No Has patient had a PCN reaction that required hospitalization No Has patient  had a PCN reaction occurring within the last 10 years: No If all of the above answers are "NO", then may proceed with Cephalosporin use.   Shellfish Allergy Swelling and Other (See Comments)    Itching lips   Suprax [Cefixime]     Pt unsure reaction, may have been childhood reaction   Latex Rash    PAST MEDICAL HISTORY Past Medical History:  Diagnosis Date   ADHD (attention deficit hyperactivity disorder)    Diabetic retinopathy (HCC)    Elevated blood pressure reading without diagnosis of hypertension    Endometrial mass    History of 2019 novel coronavirus disease (COVID-19) 03/09/2020   positive result in epic,  per pt mild to moderate symptoms that resolved   History of asthma    child   History of gonorrhea 2016   Hypertensive retinopathy    Insulin dependent type 2 diabetes mellitus ([place])    followd by pcp---- (11-16-2020 per pt checks blood sugar at home 4-5 times daily,  fasting sugar--- 70--95)   PCOS (polycystic ovarian syndrome)    Retinopathy of both eyes    followed by dr Coralyn Pear---  right eye proliferative w/ macular edema;  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   Past Surgical History:  Procedure Laterality Date   DILATATION & CURETTAGE/HYSTEROSCOPY WITH MYOSURE N/A 11/21/2020   Procedure: [place];  Surgeon: Aletha Halim, MD;  Location: [place];  Service: Gynecology;  Laterality: N/A;   KNEE ARTHROSCOPY W/ ACL RECONSTRUCTION Left 2011   TYMPANOSTOMY TUBE PLACEMENT Bilateral    child    FAMILY HISTORY Family History  Problem Relation Age of Onset   Diabetes Mother    Vision loss Mother    ADD / ADHD Brother    Allergies Brother    Asthma Brother    Vision loss Brother    Cancer Paternal Grandfather        Colon Cancer   Heart disease Father 1       died from MI at age 64    SOCIAL HISTORY Social History   Tobacco Use   Smoking status: Every Day    Years: 6.00    Types:  Cigarettes   Smokeless tobacco: Never   Tobacco comments:    11-16-2020  per pt 3 cig per day  Vaping Use   Vaping Use: Some days   Devices: uses different types  Substance Use Topics   Alcohol use: Not Currently   Drug use: Not Currently    Types: Marijuana         OPHTHALMIC EXAM:  Base Eye Exam     Visual Acuity (Snellen - Linear)       Right Left   Dist [place] CF at 1' 20/20   Dist ph [place] NI          Tonometry (Tonopen, 9:50 AM)       Right Left   Pressure 18 19         Pupils  Dark Light Shape React APD   Right 4 3.5 Round Minimal +1   Left 4 3 Round Slow None         Visual Fields (Counting fingers)       Left Right    Full    Restrictions  Total superior temporal, inferior temporal, inferior nasal deficiencies         Extraocular Movement       Right Left    Full, Ortho Full, Ortho         Neuro/Psych     Oriented x3: Yes   Mood/Affect: Normal         Dilation     Both eyes: 1.0% Mydriacyl, 2.5% Phenylephrine @ 9:50 AM           Slit Lamp and Fundus Exam     External Exam       Right Left   External Normal Normal         Slit Lamp Exam       Right Left   Lids/Lashes Normal Normal   Conjunctiva/Sclera melanosis melanosis   Cornea trace PEE trace Punctate epithelial erosions, mild tear film debris   Anterior Chamber deep and clear, narrow temporal angle deep and clear, narrow temporal angle   Iris round and poorly dilated,  no NVI Round and moderateley dilated   Lens 1-2+ cortical 1-2+ cortical   Vitreous mild syneresis, +pigment, scattered peripheral blood-stained condensations greatest inferiorly mild syneresis         Fundus Exam       Right Left   Disc +NVD - regressing, +fibrosis--slightly increased pink and sharp, no NVE   C/D Ratio  0.2   Macula blunted foveal reflex, fibrosis, +NVE -- some regression, ERM  with striae, scattered MA/DBH, +cystic changes---increased, superior fibrosis--increased  from prior flat, good reflex, scattered MA   Vessels Tortuous, Vascular attenuation, +NV, AV crossing changes, progression of fibrosis greatest along ST arcades attenuated, tortuous, AV crossing chagnes   Periphery attached, pre-retinal heme just above ST arcades, inferior periphery obscured by [facility], +PRP changes attached, scattered MA, CWS about the disc, good 360 PRP laser changes            IMAGING AND PROCEDURES  Imaging and Procedures for 01/04/2021  OCT, Retina - OU - Both Eyes       Right Eye Quality was good. Central Foveal Thickness: 348. Progression has worsened. Findings include abnormal foveal contour, intraretinal fluid, no SRF, epiretinal membrane, intraretinal hyper-reflective material, preretinal fibrosis, vitreous traction, vitreomacular adhesion (Interval progression of superior fibrosis and tractional edema, mild interval improvement in vitreous opacities).   Left Eye Quality was good. Central Foveal Thickness: 267. Progression has been stable. Findings include normal foveal contour, no SRF, no IRF, intraretinal hyper-reflective material (Trace cystic changes, trace ERM).   Notes *Images captured and stored on drive  Diagnosis / Impression:  OD: Interval progression of superior fibrosis and tractional edema OS: NFP, no IRF/SRF; trace cystic changes and trace ERM  Clinical management:  See below  Abbreviations: NFP - Normal foveal profile. CME - cystoid macular edema. PED - pigment epithelial detachment. IRF - intraretinal fluid. SRF - subretinal fluid. EZ - ellipsoid zone. ERM - epiretinal membrane. ORA - outer retinal atrophy. ORT - outer retinal tubulation. SRHM - subretinal hyper-reflective material. IRHM - intraretinal hyper-reflective material             ASSESSMENT/PLAN:    ICD-10-CM   1. Proliferative diabetic retinopathy of both eyes with  macular edema associated with type 2 diabetes mellitus ([place])  Y56.3893     2. Vitreous hemorrhage, right eye  (HCC)  H43.11     3. Retinal edema  H35.81 OCT, Retina - OU - Both Eyes    4. Essential hypertension  I10     5. Hypertensive retinopathy of both eyes  H35.033       1-3. PDR OU (OD > OS) OD with macular edema and VH; OS without DME - FA (07.01.22) shows +NV OU (OD >> OS); late-leaking MA, vascular nonperfusion -- would benefit from PRP OU - s/p IVA OD #1 (07.15.22) - s/p PRP OD (06.17.22) - s/p PRP OS (07.29.22) - BCVA OD CF from 20/150 at initial presentation; OS 20/20 - OCT shows OD: interval progression of superior fibrosis and tractional edema - discussed findings and guarded prognosis OD - recommend PPV OD with membrane peel of preretinal fibrosis/ERM, endolaser and gas vs oil, discussed need for face down positioning after surgery - discussed possible need for additional surgery following PPV  - recommend avastin OD about 1 week prior to surgery - patient wants to schedule PPV OD for 01/24/2021 - RBA of procedure discussed, questions answered, consent form signed 08.12.2022 - will need Anesthesia pre-op visit -- pt does not have PCP - f/u 08.29.2022 for pre-op visit, avastin OD, IOL master measurements OU  4,5. Hypertensive retinopathy OU - discussed importance of tight BP control - monitor   Ophthalmic Meds Ordered this visit:  No orders of the defined types were placed in this encounter.     Return in 17 days (on 01/21/2021) for DFE, OCT, avasitn OD, IOL master OU.  There are no Patient Instructions on file for this visit.  This document serves as a record of services personally performed by Gardiner Sleeper, MD, PhD. It was created on their behalf by Leonie Douglas, an ophthalmic technician. The creation of this record is the provider's dictation and/or activities during the visit.    Electronically signed by: Leonie Douglas COA, 01/04/21  12:41 PM  This document serves as a record of services personally performed by Gardiner Sleeper, MD, PhD. It was created on their  behalf by Roselee Nova, COMT. The creation of this record is the provider's dictation and/or activities during the visit.  Electronically signed by: Roselee Nova, COMT 01/04/21 12:41 PM  Gardiner Sleeper, M.D., Ph.D. Diseases & Surgery of the Retina and [place] 01/04/2021   I have reviewed the above documentation for accuracy and completeness, and I agree with the above. Gardiner Sleeper, M.D., Ph.D. 01/04/21 12:45 PM    Abbreviations: M myopia (nearsighted); A astigmatism; H hyperopia (farsighted); P presbyopia; Mrx spectacle prescription;  CTL contact lenses; OD right eye; OS left eye; OU both eyes  XT exotropia; ET esotropia; PEK punctate epithelial keratitis; PEE punctate epithelial erosions; DES dry eye syndrome; MGD meibomian gland dysfunction; ATs artificial tears; PFAT's preservative free artificial tears; [place] nuclear sclerotic cataract; PSC posterior subcapsular cataract; ERM epi-retinal membrane; PVD posterior vitreous detachment; RD retinal detachment; DM diabetes mellitus; DR diabetic retinopathy; NPDR non-proliferative diabetic retinopathy; PDR proliferative diabetic retinopathy; CSME clinically significant macular edema; DME diabetic macular edema; dbh dot blot hemorrhages; CWS cotton wool spot; POAG primary open angle glaucoma; C/D cup-to-disc ratio; HVF humphrey visual field; GVF goldmann visual field; OCT optical coherence tomography; IOP intraocular pressure; BRVO Branch retinal vein occlusion; CRVO central retinal vein occlusion; CRAO central retinal artery occlusion; BRAO branch retinal artery  occlusion; RT retinal tear; SB scleral buckle; PPV pars plana vitrectomy; VH Vitreous hemorrhage; PRP panretinal laser photocoagulation; IVK intravitreal kenalog; VMT vitreomacular traction; MH Macular hole;  NVD neovascularization of the disc; NVE neovascularization elsewhere; AREDS age related eye disease study; ARMD age related macular degeneration; POAG  primary open angle glaucoma; EBMD epithelial/anterior basement membrane dystrophy; ACIOL anterior chamber intraocular lens; IOL intraocular lens; PCIOL posterior chamber intraocular lens; Phaco/IOL phacoemulsification with intraocular lens placement; [place] photorefractive keratectomy; LASIK laser assisted in situ keratomileusis; HTN hypertension; DM diabetes mellitus; COPD chronic obstructive pulmonary disease

## 2021-01-04 ENCOUNTER — Ambulatory Visit (INDEPENDENT_AMBULATORY_CARE_PROVIDER_SITE_OTHER): Payer: Medicaid Other | Admitting: Ophthalmology

## 2021-01-04 ENCOUNTER — Encounter (INDEPENDENT_AMBULATORY_CARE_PROVIDER_SITE_OTHER): Payer: Self-pay | Admitting: Ophthalmology

## 2021-01-04 ENCOUNTER — Other Ambulatory Visit: Payer: Self-pay

## 2021-01-04 DIAGNOSIS — H35033 Hypertensive retinopathy, bilateral: Secondary | ICD-10-CM

## 2021-01-04 DIAGNOSIS — H3581 Retinal edema: Secondary | ICD-10-CM | POA: Diagnosis not present

## 2021-01-04 DIAGNOSIS — E113513 Type 2 diabetes mellitus with proliferative diabetic retinopathy with macular edema, bilateral: Secondary | ICD-10-CM | POA: Diagnosis not present

## 2021-01-04 DIAGNOSIS — I10 Essential (primary) hypertension: Secondary | ICD-10-CM | POA: Diagnosis not present

## 2021-01-04 DIAGNOSIS — H4311 Vitreous hemorrhage, right eye: Secondary | ICD-10-CM | POA: Diagnosis not present

## 2021-01-07 ENCOUNTER — Encounter (INDEPENDENT_AMBULATORY_CARE_PROVIDER_SITE_OTHER): Payer: Self-pay | Admitting: Ophthalmology

## 2021-01-15 NOTE — H&P (View-Only) (Signed)
Mercedes Valdez is an 25 y.o. female.    Chief Complaint: decreased vision OD  HPI: Pt with DM2 with proliferative diabetic retinopathy OU. Pt has had progression of preretinal fibrosis and epiretinal membrane with tractional edema OD and progressively decreased vision. After a discussion of the risks, benefits and alternatives to surgery, the patient has elected to proceed with surgery to remove preretinal fibrosis and ERM related to proliferative diabetic retinopathy -- 25g PPV w/ membrane peel, endolaser, and gas vs oil OD, under general anesthesia.  Past Medical History:  Diagnosis Date   ADHD (attention deficit hyperactivity disorder)    Diabetic retinopathy ([place])    Elevated blood pressure reading without diagnosis of hypertension    Endometrial mass    History of 2019 novel coronavirus disease (COVID-19) 03/09/2020   positive result in epic,  per pt mild to moderate symptoms that resolved   History of asthma    child   History of gonorrhea 2016   Hypertensive retinopathy    Insulin dependent type 2 diabetes mellitus ([place])    followd by pcp---- (11-16-2020 per pt checks blood sugar at home 4-5 times daily,  fasting sugar--- 70--95)   PCOS (polycystic ovarian syndrome)    Retinopathy of both eyes    followed by dr Coralyn Pear---  right eye proliferative w/ macular edema;  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    Past Surgical History:  Procedure Laterality Date   DILATATION & CURETTAGE/HYSTEROSCOPY WITH MYOSURE N/A 11/21/2020   Procedure: [place];  Surgeon: Aletha Halim, MD;  Location: [facility];  Service: Gynecology;  Laterality: N/A;   KNEE ARTHROSCOPY W/ ACL RECONSTRUCTION Left 2011   TYMPANOSTOMY TUBE PLACEMENT Bilateral    child    Family History  Problem Relation Age of Onset   Diabetes Mother    Vision loss Mother    ADD / ADHD Brother    Allergies Brother    Asthma Brother    Vision loss  Brother    Cancer Paternal Grandfather        Colon Cancer   Heart disease Father 83       died from MI at age 63   Social History:  reports that she has been smoking cigarettes. She has never used smokeless tobacco. She reports that she does not currently use alcohol. She reports that she does not currently use drugs after having used the following drugs: Marijuana.  Allergies:  Allergies  Allergen Reactions   Amoxicillin Hives   Augmentin [Amoxicillin-Pot Clavulanate] Hives   Penicillins Hives    Has patient had a PCN reaction causing immediate rash, facial/tongue/throat swelling, SOB or lightheadedness with hypotension: No Has patient had a PCN reaction causing severe rash involving mucus membranes or skin necrosis: No Has patient had a PCN reaction that required hospitalization No Has patient had a PCN reaction occurring within the last 10 years: No If all of the above answers are "NO", then may proceed with Cephalosporin use.   Shellfish Allergy Swelling and Other (See Comments)    Itching lips   Suprax [Cefixime]     Pt unsure reaction, may have been childhood reaction   Latex Rash    No medications prior to admission.    Review of systems otherwise negative  unknown if currently breastfeeding.  Physical exam: Mental status: oriented x3. Eyes: See eye exam associated with this date of surgery Ears, Nose, Throat: within normal limits Neck: Within Normal limits General: within normal limits  Chest: Within normal limits Breast: deferred Heart: Within normal limits Abdomen: Within normal limits GU: deferred Extremities: within normal limits Skin: within normal limits  Assessment/Plan 1. 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 - case scheduled for Thursday, 01/24/21, 1130 am -- [facility] OR  08  Gardiner Sleeper, M.D., Ph.D. Vitreoretinal Surgeon [facility]

## 2021-01-15 NOTE — Progress Notes (Addendum)
Surgical Instructions    Your procedure is scheduled on Thursday 01/24/21.   Report to [facility] Main Entrance "A" at 09:30 A.M., then check in with the Admitting office.  Call this number if you have problems the morning of surgery:  [phone]   If you have any questions prior to your surgery date call [phone]: Open Monday-Friday 8am-4pm    Remember:  Do not eat after midnight the night before your surgery  You may drink clear liquids until 08:30 A.M. the morning of your surgery.   Clear liquids allowed are: Water, Non-Citrus Juices (without pulp), Carbonated Beverages, Clear Tea, Black Coffee Only- do not add cream or sugar, and Gatorade    Take these medicines the morning of surgery with A SIP OF WATER   loratadine (CLARITIN)   As of today, STOP taking any Aspirin (unless otherwise instructed by your surgeon) Aleve, Naproxen, Ibuprofen, Motrin, Advil, Goody's, BC's, all herbal medications, fish oil, and all vitamins.  WHAT DO I DO ABOUT MY DIABETES MEDICATION?   Do not take oral diabetes medicines (pills) the morning of surgery.  THE NIGHT BEFORE SURGERY, take 15 units of  insulin glargine (LANTUS SOLOSTAR)    THE MORNING OF SURGERY, take 15 units of insulin glargine (LANTUS SOLOSTAR)   THE MORNING OF SURGERY, do not take insulin aspart (NOVOLOG)  The day of surgery, do not take other diabetes injectables, including Byetta (exenatide), Bydureon (exenatide ER), Victoza (liraglutide), or Trulicity (dulaglutide).  If your CBG is greater than 220 mg/dL, you may take  of your sliding scale (correction) dose of insulin.   HOW TO MANAGE YOUR DIABETES BEFORE AND AFTER SURGERY  Why is it important to control my blood sugar before and after surgery? Improving blood sugar levels before and after surgery helps healing and can limit problems. A way of improving blood sugar control is eating a healthy diet by:  Eating less sugar and carbohydrates  Increasing  activity/exercise  Talking with your doctor about reaching your blood sugar goals High blood sugars (greater than 180 mg/dL) can raise your risk of infections and slow your recovery, so you will need to focus on controlling your diabetes during the weeks before surgery. Make sure that the doctor who takes care of your diabetes knows about your planned surgery including the date and location.  How do I manage my blood sugar before surgery? Check your blood sugar at least 4 times a day, starting 2 days before surgery, to make sure that the level is not too high or low.  Check your blood sugar the morning of your surgery when you wake up and every 2 hours until you get to the Short Stay unit.  If your blood sugar is less than 70 mg/dL, you will need to treat for low blood sugar: Do not take insulin. Treat a low blood sugar (less than 70 mg/dL) with  cup of clear juice (cranberry or apple), 4 glucose tablets, OR glucose gel. Recheck blood sugar in 15 minutes after treatment (to make sure it is greater than 70 mg/dL). If your blood sugar is not greater than 70 mg/dL on recheck, call [phone] for further instructions. Report your blood sugar to the short stay nurse when you get to Short Stay.  If you are admitted to the hospital after surgery: Your blood sugar will be checked by the staff and you will probably be given insulin after surgery (instead of oral diabetes medicines) to make sure you have good blood sugar levels. The  goal for blood sugar control after surgery is 80-180 mg/dL.                     Do NOT Smoke (Tobacco/Vaping) or drink Alcohol 24 hours prior to your procedure.  If you use a CPAP at night, you may bring all equipment for your overnight stay.   Contacts, glasses, piercing's, hearing aid's, dentures or partials may not be worn into surgery, please bring cases for these belongings.    For patients admitted to the hospital, discharge time will be determined by your  treatment team.   Patients discharged the day of surgery will not be allowed to drive home, and someone needs to stay with them for 24 hours.  ONLY 1 SUPPORT PERSON MAY BE PRESENT WHILE YOU ARE IN SURGERY. IF YOU ARE TO BE ADMITTED ONCE YOU ARE IN YOUR ROOM YOU WILL BE ALLOWED TWO (2) VISITORS.  Minor children may have two parents present. Special consideration for safety and communication needs will be reviewed on a case by case basis.   Special instructions:   [place]- Preparing For Surgery  Before surgery, you can play an important role. Because skin is not sterile, your skin needs to be as free of germs as possible. You can reduce the number of germs on your skin by washing with CHG (chlorahexidine gluconate) Soap before surgery.  CHG is an antiseptic cleaner which kills germs and bonds with the skin to continue killing germs even after washing.    Oral Hygiene is also important to reduce your risk of infection.  Remember - BRUSH YOUR TEETH THE MORNING OF SURGERY WITH YOUR REGULAR TOOTHPASTE  Please do not use if you have an allergy to CHG or antibacterial soaps. If your skin becomes reddened/irritated stop using the CHG.  Do not shave (including legs and underarms) for at least 48 hours prior to first CHG shower. It is OK to shave your face.  Please follow these instructions carefully.   Shower the NIGHT BEFORE SURGERY and the MORNING OF SURGERY  If you chose to wash your hair, wash your hair first as usual with your normal shampoo.  After you shampoo, rinse your hair and body thoroughly to remove the shampoo.  Use CHG Soap as you would any other liquid soap. You can apply CHG directly to the skin and wash gently with a scrungie or a clean washcloth.   Apply the CHG Soap to your body ONLY FROM THE NECK DOWN.  Do not use on open wounds or open sores. Avoid contact with your eyes, ears, mouth and genitals (private parts). Wash Face and genitals (private parts)  with your normal  soap.   Wash thoroughly, paying special attention to the area where your surgery will be performed.  Thoroughly rinse your body with warm water from the neck down.  DO NOT shower/wash with your normal soap after using and rinsing off the CHG Soap.  Pat yourself dry with a CLEAN TOWEL.  Wear CLEAN PAJAMAS to bed the night before surgery  Place CLEAN SHEETS on your bed the night before your surgery  DO NOT SLEEP WITH PETS.   Day of Surgery: Shower with CHG soap. Do not wear jewelry, make up, nail polish, gel polish, artificial nails, or any other type of covering on natural nails including finger and toenails. If patients have artificial nails, gel coating, etc. that need to be removed by a nail salon please have this removed prior to surgery. Surgery  may need to be canceled/delayed if the surgeon/ anesthesia feels like the patient is unable to be adequately monitored. Do not wear lotions, powders, perfumes/colognes, or deodorant. Do not shave 48 hours prior to surgery.  Men may shave face and neck. Do not bring valuables to the hospital. [facility] is not responsible for any belongings or valuables. Wear Clean/Comfortable clothing the morning of surgery Remember to brush your teeth WITH YOUR REGULAR TOOTHPASTE.   Please read over the following fact sheets that you were given.

## 2021-01-15 NOTE — H&P (Signed)
Mercedes Valdez is an 25 y.o. female.    Chief Complaint: decreased vision OD  HPI: Pt with DM2 with proliferative diabetic retinopathy OU. Pt has had progression of preretinal fibrosis and epiretinal membrane with tractional edema OD and progressively decreased vision. After a discussion of the risks, benefits and alternatives to surgery, the patient has elected to proceed with surgery to remove preretinal fibrosis and ERM related to proliferative diabetic retinopathy -- 25g PPV w/ membrane peel, endolaser, and gas vs oil OD, under general anesthesia.  Past Medical History:  Diagnosis Date   ADHD (attention deficit hyperactivity disorder)    Diabetic retinopathy ([place])    Elevated blood pressure reading without diagnosis of hypertension    Endometrial mass    History of 2019 novel coronavirus disease (COVID-19) 03/09/2020   positive result in epic,  per pt mild to moderate symptoms that resolved   History of asthma    child   History of gonorrhea 2016   Hypertensive retinopathy    Insulin dependent type 2 diabetes mellitus ([place])    followd by pcp---- (11-16-2020 per pt checks blood sugar at home 4-5 times daily,  fasting sugar--- 70--95)   PCOS (polycystic ovarian syndrome)    Retinopathy of both eyes    followed by dr Coralyn Pear---  right eye proliferative w/ macular edema;  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    Past Surgical History:  Procedure Laterality Date   DILATATION & CURETTAGE/HYSTEROSCOPY WITH MYOSURE N/A 11/21/2020   Procedure: [place];  Surgeon: Aletha Halim, MD;  Location: [place];  Service: Gynecology;  Laterality: N/A;   KNEE ARTHROSCOPY W/ ACL RECONSTRUCTION Left 2011   TYMPANOSTOMY TUBE PLACEMENT Bilateral    child    Family History  Problem Relation Age of Onset   Diabetes Mother    Vision loss Mother    ADD / ADHD Brother    Allergies Brother    Asthma Brother    Vision loss  Brother    Cancer Paternal Grandfather        Colon Cancer   Heart disease Father 87       died from MI at age 21   Social History:  reports that she has been smoking cigarettes. She has never used smokeless tobacco. She reports that she does not currently use alcohol. She reports that she does not currently use drugs after having used the following drugs: Marijuana.  Allergies:  Allergies  Allergen Reactions   Amoxicillin Hives   Augmentin [Amoxicillin-Pot Clavulanate] Hives   Penicillins Hives    Has patient had a PCN reaction causing immediate rash, facial/tongue/throat swelling, SOB or lightheadedness with hypotension: No Has patient had a PCN reaction causing severe rash involving mucus membranes or skin necrosis: No Has patient had a PCN reaction that required hospitalization No Has patient had a PCN reaction occurring within the last 10 years: No If all of the above answers are "NO", then may proceed with Cephalosporin use.   Shellfish Allergy Swelling and Other (See Comments)    Itching lips   Suprax [Cefixime]     Pt unsure reaction, may have been childhood reaction   Latex Rash    No medications prior to admission.    Review of systems otherwise negative  unknown if currently breastfeeding.  Physical exam: Mental status: oriented x3. Eyes: See eye exam associated with this date of surgery Ears, Nose, Throat: within normal limits Neck: Within Normal limits General: within normal limits  Chest: Within normal limits Breast: deferred Heart: Within normal limits Abdomen: Within normal limits GU: deferred Extremities: within normal limits Skin: within normal limits  Assessment/Plan 1. 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 - case scheduled for Thursday, 01/24/21, 1130 am -- [facility] OR  08  Gardiner Sleeper, M.D., Ph.D. Vitreoretinal Surgeon [facility]

## 2021-01-16 ENCOUNTER — Other Ambulatory Visit: Payer: Self-pay

## 2021-01-16 ENCOUNTER — Encounter (HOSPITAL_COMMUNITY): Payer: Self-pay

## 2021-01-16 ENCOUNTER — Encounter (HOSPITAL_COMMUNITY): Payer: Self-pay | Admitting: Physician Assistant

## 2021-01-16 ENCOUNTER — Encounter (HOSPITAL_COMMUNITY)
Admission: RE | Admit: 2021-01-16 | Discharge: 2021-01-16 | Disposition: A | Discharge status: Home / Self Care | Payer: Medicaid Other | Source: Ambulatory Visit | Attending: Ophthalmology | Admitting: Ophthalmology

## 2021-01-16 DIAGNOSIS — Z01812 Encounter for preprocedural laboratory examination: Secondary | ICD-10-CM | POA: Diagnosis not present

## 2021-01-16 HISTORY — DX: Unspecified asthma, uncomplicated: J45.909

## 2021-01-16 LAB — BASIC METABOLIC PANEL
Anion gap: 7 (ref 5–15)
BUN: 6 mg/dL (ref 6–20)
CO2: 23 mmol/L (ref 22–32)
Calcium: 8.5 mg/dL — ABNORMAL LOW (ref 8.9–10.3)
Chloride: 108 mmol/L (ref 98–111)
Creatinine, Ser: 0.48 mg/dL (ref 0.44–1.00)
GFR, Estimated: >60 mL/min (ref >60)
Glucose, Bld: 152 mg/dL — ABNORMAL HIGH (ref 70–99)
Potassium: 3.5 mmol/L (ref 3.5–5.1)
Sodium: 138 mmol/L (ref 135–145)

## 2021-01-16 LAB — CBC
HCT: 38.3 % (ref 36.0–46.0)
Hemoglobin: 12.7 g/dL (ref 12.0–15.0)
MCH: 27.5 pg (ref 26.0–34.0)
MCHC: 33.2 g/dL (ref 30.0–36.0)
MCV: 83.1 fL (ref 80.0–100.0)
Platelets: 546 10*3/uL — ABNORMAL HIGH (ref 150–400)
RBC: 4.61 MIL/uL (ref 3.87–5.11)
RDW: 13.2 % (ref 11.5–15.5)
WBC: 10.2 10*3/uL (ref 4.0–10.5)
nRBC: 0 % (ref 0.0–0.2)

## 2021-01-16 LAB — HEMOGLOBIN A1C
Hgb A1c MFr Bld: 14 % — ABNORMAL HIGH (ref 4.8–5.6)
Mean Plasma Glucose: 355.1 mg/dL

## 2021-01-16 LAB — GLUCOSE, CAPILLARY: Glucose-Capillary: 201 mg/dL — ABNORMAL HIGH (ref 70–99)

## 2021-01-16 NOTE — Progress Notes (Addendum)
PCP - Patient states she does not have a PCP. She saw an eye doctor at [facility] and was referred to Dr. Coralyn Pear for eye surgery Cardiologist - denies OB/GYN- Dr. Ilda Basset  PPM/ICD - n/a Device Orders - n/a Rep Notified - n/a  Chest x-ray - 03/09/20 EKG - 03/09/20 Stress Test - denies ECHO - denies Cardiac Cath - denies  Sleep Study - denies CPAP - n/a  Fasting Blood Sugar today- 201 Checks Blood Sugar three times a day Range- 200-220 before meals and 250-300 after meals  Blood Thinner Instructions: n/a Aspirin Instructions: n/a  ERAS Protcol - Yes PRE-SURGERY Ensure or G2- n/a  COVID TEST- No. Ambulatory Surgery   Anesthesia review: Yes. Spoke with Karoline Caldwell, PA regarding BP 141/95 and CBG-201at PAT appointment.  Hemoglobin A1 C 14.0 on 01/16/21. Dr. Coralyn Pear also made aware.   Patient denies shortness of breath, fever, cough and chest pain at PAT appointment   All instructions explained to the patient, with a verbal understanding of the material. Patient agrees to go over the instructions while at home for a better understanding. Patient also instructed to self quarantine after being tested for COVID-19. The opportunity to ask questions was provided.

## 2021-01-17 NOTE — Progress Notes (Signed)
Anesthesia Chart Review:  Patient noted to have markedly uncontrolled IDDM 2 at preop testing with A1c 14.0.  Patient was advised that significantly elevated CBG on day of surgery could be cause for cancellation.  She is advised to take all medications as prescribed and follow-up with prescribing provider regarding management of diabetes.  PAT RN Wendall Mola also sent staff message to Dr. Coralyn Pear to make him aware that patient is at risk for day of surgery cancellation given uncontrolled diabetes.  Remainder of lab work was unremarkable.   Wynonia Musty [facility] Short Stay Center/Anesthesiology Phone [phone] 01/17/2021 2:53 PM

## 2021-01-21 ENCOUNTER — Encounter (INDEPENDENT_AMBULATORY_CARE_PROVIDER_SITE_OTHER): Payer: Medicaid Other | Admitting: Ophthalmology

## 2021-01-21 ENCOUNTER — Encounter: Payer: Self-pay | Admitting: Obstetrics and Gynecology

## 2021-01-21 ENCOUNTER — Ambulatory Visit (INDEPENDENT_AMBULATORY_CARE_PROVIDER_SITE_OTHER): Payer: Medicaid Other | Admitting: Obstetrics and Gynecology

## 2021-01-21 ENCOUNTER — Other Ambulatory Visit: Payer: Self-pay

## 2021-01-21 VITALS — BP 121/89 | HR 92 | Wt 223.9 lb

## 2021-01-21 DIAGNOSIS — O020 Blighted ovum and nonhydatidiform mole: Secondary | ICD-10-CM | POA: Diagnosis not present

## 2021-01-21 DIAGNOSIS — E113412 Type 2 diabetes mellitus with severe nonproliferative diabetic retinopathy with macular edema, left eye: Secondary | ICD-10-CM

## 2021-01-21 DIAGNOSIS — I10 Essential (primary) hypertension: Secondary | ICD-10-CM

## 2021-01-21 DIAGNOSIS — E113513 Type 2 diabetes mellitus with proliferative diabetic retinopathy with macular edema, bilateral: Secondary | ICD-10-CM

## 2021-01-21 DIAGNOSIS — H3581 Retinal edema: Secondary | ICD-10-CM

## 2021-01-21 DIAGNOSIS — H4311 Vitreous hemorrhage, right eye: Secondary | ICD-10-CM

## 2021-01-21 DIAGNOSIS — H35033 Hypertensive retinopathy, bilateral: Secondary | ICD-10-CM

## 2021-01-21 NOTE — Progress Notes (Signed)
Obstetrics and Gynecology Visit Return Patient Evaluation  Appointment Date: 01/21/2021  Primary Care Provider: [place], [place] [facility]  Chief Complaint: depo provera and molar pregnancy surveillance  History of Present Illness:  Mercedes Valdez is a 25 y.o. here with the above CC.   PMHx significant for BMI 40s, HTN, poorly controlled DM2 (12/2020: a1c 14), DM retinopathy.  Had persistent AUB since depo shot but spotting stopped a few days ago   Review of Systems: as noted in the History of Present Illness.   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 Valdez had no medications administered during this visit. Current Outpatient Medications  Medication Sig Dispense Refill   Accu-Chek Softclix Lancets lancets To check blood sugars 4 times a day. Fasting and 2 hours after breakfast, lunch and dinner. 100 each 8   Blood Pressure Monitoring (BLOOD PRESSURE KIT) DEVI 1 Device by Does not apply route once a week. Please take blood pressure once a week and record in Babyscripts. 1 each 0   glucose blood (ACCU-CHEK GUIDE) test strip To check blood sugars 4 times a day. Fasting, and 2 hours after Breakfast, Lunch and Dinner 100 each 8   insulin aspart (NOVOLOG) 100 UNIT/ML FlexPen Inject 10 Units into the skin 3 (three) times daily before meals. 9 mL 1   insulin glargine (LANTUS SOLOSTAR)  100 UNIT/ML Solostar Pen Inject 30 Units into the skin daily. (Patient taking differently: Inject 30 Units into the skin at bedtime.) 9 mL 1   Insulin Pen Needle (PENTIPS) 32G X 4 MM MISC Use as directed 100 each 3   Misc. Devices (GOJJI WEIGHT SCALE) MISC 1 Device by Does not apply route once a week. Please take weight and record in Babyscripts once a week. 1 each 0   ibuprofen (MOTRIN IB) 200 MG tablet Take 3 tablets (600 mg total) by mouth every 6 (six) hours as needed. (Patient not taking: Reported on 01/21/2021) 30 tablet 0   loratadine (CLARITIN) 10 MG tablet Take 1 tablet (10 mg total) by mouth daily. (Patient not taking: Reported on 01/21/2021) 30 tablet 2   No current facility-administered medications for this visit.    Allergies: is allergic to amoxicillin, augmentin [amoxicillin-pot clavulanate], penicillins, shellfish allergy, suprax [cefixime], and latex.  Physical Exam:  BP 121/89   Pulse 92   Wt 223 lb 14.4 oz (101.6 kg)   BMI 37.84 kg/m  Body mass index is 37.84 kg/m. General appearance: Well nourished, well developed female in no acute distress.  Neuro/Psych:  Normal mood and affect.     Assessment: pt stable  Plan:  1. Molar pregnancy One more beta hcg today. I told her if still negative, can d/c monthly checks  2. GYN She was having regular, qmonth periods prior to most recent pregnancy (molar) and was on depo provera for birth control and due to follow beta hcgs to zero and surveillance. I told her that I strongly recommend  continuing on some sort of birth control until she gets her medical co-morbidities under control with uncontrolled diabetes, especially, carrying a high risk of complications and for baby, especially high risk of birth defects such as sacral agenesis. I told her that the AUB she had with the depo could've been from the SAB and resolving molar pregnancy or the depo, too but many women have this especially with their first 63minjection and I  encouraged her to continue on it or some method.  All options d/w her and pt declines to continue on depo provera or anything else today. I told her to please think about these options and to start something.   I also told her that this is also why she is having chronic yeast infections. A1c checked last week for pre op by optho was 14. Pt is unaware when's last time she saw her PCP on [place]. I told her to call them and I'll send them a note too asking for a follow up visit for her medical problems.  - Beta hCG quant (ref lab)   RTC: 338mor follow up and annual exam  ChDurene RomansD Attending Center [facility]

## 2021-01-22 ENCOUNTER — Encounter: Payer: Self-pay | Admitting: Obstetrics and Gynecology

## 2021-01-22 LAB — BETA HCG QUANT (REF LAB): hCG Quant: <1 m[IU]/mL

## 2021-01-23 NOTE — Progress Notes (Signed)
[facility] Note  01/24/2021     CHIEF COMPLAINT Patient presents for Retina Follow Up   HISTORY OF PRESENT ILLNESS: Mercedes Valdez is a 25 y.o. female who presents to the clinic today for:   HPI     Retina Follow Up   Patient presents with  Diabetic Retinopathy.  In both eyes.  Duration of 3 weeks.  Since onset it is stable.  I, the attending physician,  performed the HPI with the patient and updated documentation appropriately.        Comments   Pt here for 3 wk ref f/u for PDR OU. Pt reports getting headaches on the right side which causes ocular pain OD as well as blurry vision. She reports this occurring for the past few days, usually lasting for a while. She has not been taking ibuprofen or Tylenol. Vision currently is stable, it is just when she has those headaches it seems to change.       Last edited by Bernarda Caffey, MD on 01/24/2021  2:54 PM.     Patient states she has been having migraines that are causing her vision to be blurry  Referring physician: Dr. Anthony Sar [address]  [place], [place] [address]  HISTORICAL INFORMATION:   Selected notes from the MEDICAL RECORD NUMBER Referred by Dr. Marin Comment LEE: unsure Ocular Hx- decreased vision due to Diabetes PMH- DM    CURRENT MEDICATIONS: No current outpatient medications on file. (Ophthalmic Drugs)   No current facility-administered medications for this visit. (Ophthalmic Drugs)   Current Outpatient Medications (Other)  Medication Sig   Accu-Chek Softclix Lancets lancets To check blood sugars 4 times a day. Fasting and 2 hours after breakfast, lunch and dinner.   Blood Pressure Monitoring (BLOOD PRESSURE KIT) DEVI 1 Device by Does not apply route once a week. Please take blood pressure once a week and record in [employer].   glucose blood (ACCU-CHEK GUIDE) test strip To check blood sugars 4 times a day. Fasting, and 2 hours after Breakfast, Lunch and Dinner   ibuprofen (MOTRIN IB) 200  MG tablet Take 3 tablets (600 mg total) by mouth every 6 (six) hours as needed. (Patient not taking: Reported on 01/21/2021)   insulin aspart (NOVOLOG) 100 UNIT/ML FlexPen Inject 10 Units into the skin 3 (three) times daily before meals.   insulin glargine (LANTUS SOLOSTAR) 100 UNIT/ML Solostar Pen Inject 30 Units into the skin daily. (Patient taking differently: Inject 30 Units into the skin at bedtime.)   Insulin Pen Needle (PENTIPS) 32G X 4 MM MISC Use as directed   loratadine (CLARITIN) 10 MG tablet Take 1 tablet (10 mg total) by mouth daily. (Patient not taking: Reported on 01/21/2021)   Misc. Devices (GOJJI WEIGHT SCALE) MISC 1 Device by Does not apply route once a week. Please take weight and record in Babyscripts once a week.   No current facility-administered medications for this visit. (Other)   REVIEW OF SYSTEMS: ROS   Positive for: Endocrine, Eyes, Respiratory Negative for: Constitutional, Gastrointestinal, Neurological, Skin, Genitourinary, Musculoskeletal, HENT, Cardiovascular, Psychiatric, Allergic/Imm, Heme/Lymph Last edited by Kingsley Spittle, COT on 01/24/2021  9:23 AM.      ALLERGIES Allergies  Allergen Reactions   Amoxicillin Hives   Augmentin [Amoxicillin-Pot Clavulanate] Hives   Penicillins Hives    Has patient had a PCN reaction causing immediate rash, facial/tongue/throat swelling, SOB or lightheadedness with hypotension: No Has patient had a PCN reaction causing severe rash involving  mucus membranes or skin necrosis: No Has patient had a PCN reaction that required hospitalization No Has patient had a PCN reaction occurring within the last 10 years: No If all of the above answers are "NO", then may proceed with Cephalosporin use.   Shellfish Allergy Swelling and Other (See Comments)    Itching lips   Suprax [Cefixime]     Pt unsure reaction, may have been childhood reaction   Latex Rash    PAST MEDICAL HISTORY Past Medical History:  Diagnosis Date   ADHD  (attention deficit hyperactivity disorder)    Asthma    Childhood   Diabetic retinopathy ([place])    Elevated blood pressure reading without diagnosis of hypertension    Endometrial mass    History of 2019 novel coronavirus disease (COVID-19) 03/09/2020   positive result in epic,  per pt mild to moderate symptoms that resolved   History of asthma    child   History of gonorrhea 2016   Hypertensive retinopathy    Insulin dependent type 2 diabetes mellitus ([place])    followd by pcp---- (11-16-2020 per pt checks blood sugar at home 4-5 times daily,  fasting sugar--- 70--95)   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 See comment>>a molar pregnancy is not excluded; correlation with beta-HCG is  recommended.  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   Past Surgical History:  Procedure Laterality Date   DILATATION & CURETTAGE/HYSTEROSCOPY WITH MYOSURE N/A 11/21/2020   Procedure: [place];  Surgeon: Aletha Halim, MD;  Location: [facility];  Service: Gynecology;  Laterality: N/A;   KNEE ARTHROSCOPY W/ ACL RECONSTRUCTION Left 2011   TYMPANOSTOMY TUBE PLACEMENT Bilateral    child    FAMILY HISTORY Family History  Problem Relation Age of Onset   Diabetes Mother    Vision loss Mother    ADD / ADHD Brother    Allergies Brother    Asthma Brother    Vision loss Brother    Cancer Paternal Grandfather        Colon Cancer   Heart disease Father 69       died from MI at age 24    SOCIAL HISTORY Social History   Tobacco Use   Smoking status: Every Day    Packs/day: 0.25     Years: 6.00    Pack years: 1.50    Types: Cigarettes   Smokeless tobacco: Never   Tobacco comments:    11-16-2020  per pt 3 cig per day  Vaping Use   Vaping Use: Former   Devices: uses different types  Substance Use Topics   Alcohol use: Yes    Comment: socially   Drug use: Not Currently    Types: Marijuana         OPHTHALMIC EXAM:  Base Eye Exam     Visual Acuity (Snellen - Linear)       Right Left   Dist [place] CF at 3' 20/20         Tonometry (Tonopen, 9:45 AM)       Right Left   Pressure 21 21  Pupils       Dark Light Shape React APD   Right 4 3.5 Round Minimal +1   Left 4 3 Round Brisk None         Visual Fields (Counting fingers)       Left Right    Full    Restrictions  Total superior temporal, inferior temporal, inferior nasal deficiencies         Extraocular Movement       Right Left    Ortho Ortho    -- -- --  --  --  -- -- --   -- -- --  --  --  -- -- --           Neuro/Psych     Oriented x3: Yes   Mood/Affect: Normal         Dilation     Both eyes: 1.0% Mydriacyl, 2.5% Phenylephrine @ 9:45 AM           Slit Lamp and Fundus Exam     External Exam       Right Left   External Normal Normal         Slit Lamp Exam       Right Left   Lids/Lashes Normal Normal   Conjunctiva/Sclera melanosis melanosis   Cornea trace PEE trace Punctate epithelial erosions, mild tear film debris   Anterior Chamber deep and clear, narrow temporal angle deep and clear, narrow temporal angle   Iris round and poorly dilated,  no NVI Round and moderateley dilated   Lens 1-2+ cortical 1-2+ cortical   Vitreous mild syneresis, +pigment, scattered peripheral blood-stained condensations greatest inferiorly mild syneresis         Fundus Exam       Right Left   Disc +NVD - regressing, +fibrosis pink and sharp, no NVE   C/D Ratio  0.2   Macula blunted foveal reflex, fibrosis, +NVE -- some regression, ERM  with striae,  scattered MA/DBH, +cystic changes---increased, superior fibrosis--increased from prior flat, good reflex, scattered MA   Vessels Tortuous, Vascular attenuation, +NV, AV crossing changes, progression of fibrosis greatest along ST arcades attenuated, tortuous, AV crossing chagnes   Periphery attached, pre-retinal heme just above ST arcades, inferior periphery obscured by [facility], +PRP changes attached, scattered MA, CWS about the disc, good 360 PRP laser changes            IMAGING AND PROCEDURES  Imaging and Procedures for 01/24/2021  OCT, Retina - OU - Both Eyes       Right Eye Quality was good. Central Foveal Thickness: 304. Progression has improved. Findings include abnormal foveal contour, intraretinal fluid, no SRF, epiretinal membrane, intraretinal hyper-reflective material, preretinal fibrosis, vitreous traction, vitreomacular adhesion (Mild interval improvement in tractional edema).   Left Eye Quality was good. Central Foveal Thickness: 268. Progression has been stable. Findings include normal foveal contour, no SRF, no IRF, intraretinal hyper-reflective material (Trace cystic changes, trace ERM).   Notes *Images captured and stored on drive  Diagnosis / Impression:  OD: Mild interval improvement in tractional edema OS: NFP, no IRF/SRF; trace cystic changes and trace ERM  Clinical management:  See below  Abbreviations: NFP - Normal foveal profile. CME - cystoid macular edema. PED - pigment epithelial detachment. IRF - intraretinal fluid. SRF - subretinal fluid. EZ - ellipsoid zone. ERM - epiretinal membrane. ORA - outer retinal atrophy. ORT - outer retinal tubulation. SRHM - subretinal hyper-reflective material. IRHM - intraretinal hyper-reflective material  ASSESSMENT/PLAN:    ICD-10-CM   1. Proliferative diabetic retinopathy of both eyes with macular edema associated with type 2 diabetes mellitus ([place])  H74.1423     2. Vitreous hemorrhage, right eye (HCC)   H43.11     3. Retinal edema  H35.81 OCT, Retina - OU - Both Eyes    4. Essential hypertension  I10     5. Hypertensive retinopathy of both eyes  H35.033       1-3. PDR OU (OD > OS) OD with macular edema and VH; OS without DME - FA (07.01.22) shows +NV OU (OD >> OS); late-leaking MA, vascular nonperfusion -- would benefit from PRP OU - s/p IVA OD #1 (07.15.22) - s/p PRP OD (06.17.22) - s/p PRP OS (07.29.22) - BCVA OD CF from 20/150 at initial presentation; OS 20/20 - OCT shows OD: interval progression of superior fibrosis and tractional edema - discussed findings and guarded prognosis OD - recommend PPV OD with membrane peel of preretinal fibrosis/ERM, endolaser and gas vs oil, discussed need for face down positioning after surgery - discussed possible need for additional surgery following PPV  - recommend avastin OD about 1 week prior to surgery - patient wants to schedule PPV OD for 02/07/2021 - RBA of procedure discussed, questions answered, consent form signed 08.12.2022 - pt completed Anesthesia pre-op visit on 08.4.22 - f/u 09.12.2022 for pre-op visit and avastin OD here  4,5. Hypertensive retinopathy OU - discussed importance of tight BP control - monitor   Ophthalmic Meds Ordered this visit:  No orders of the defined types were placed in this encounter.     Return for f/u 09.12.22, PDR OU, DFE, OCT.  There are no Patient Instructions on file for this visit.  This document serves as a record of services personally performed by Gardiner Sleeper, MD, PhD. It was created on their behalf by San Jetty. Owens Shark, OA an ophthalmic technician. The creation of this record is the provider's dictation and/or activities during the visit.    Electronically signed by: San Jetty. Owens Shark, [place] 08.31.2022 2:56 PM   Gardiner Sleeper, M.D., Ph.D. Diseases & Surgery of the Retina and Vitreous [facility]  I have reviewed the above documentation for accuracy and  completeness, and I agree with the above. Gardiner Sleeper, M.D., Ph.D. 01/24/21 2:56 PM   Abbreviations: M myopia (nearsighted); A astigmatism; H hyperopia (farsighted); P presbyopia; Mrx spectacle prescription;  CTL contact lenses; OD right eye; OS left eye; OU both eyes  XT exotropia; ET esotropia; PEK punctate epithelial keratitis; PEE punctate epithelial erosions; DES dry eye syndrome; MGD meibomian gland dysfunction; ATs artificial tears; PFAT's preservative free artificial tears; [place] nuclear sclerotic cataract; PSC posterior subcapsular cataract; ERM epi-retinal membrane; PVD posterior vitreous detachment; RD retinal detachment; DM diabetes mellitus; DR diabetic retinopathy; NPDR non-proliferative diabetic retinopathy; PDR proliferative diabetic retinopathy; CSME clinically significant macular edema; DME diabetic macular edema; dbh dot blot hemorrhages; CWS cotton wool spot; POAG primary open angle glaucoma; C/D cup-to-disc ratio; HVF humphrey visual field; GVF goldmann visual field; OCT optical coherence tomography; IOP intraocular pressure; BRVO Branch retinal vein occlusion; CRVO central retinal vein occlusion; CRAO central retinal artery occlusion; BRAO branch retinal artery occlusion; RT retinal tear; SB scleral buckle; PPV pars plana vitrectomy; VH Vitreous hemorrhage; PRP panretinal laser photocoagulation; IVK intravitreal kenalog; VMT vitreomacular traction; MH Macular hole;  NVD neovascularization of the disc; NVE neovascularization elsewhere; AREDS age related eye disease study; ARMD age related macular degeneration; POAG  primary open angle glaucoma; EBMD epithelial/anterior basement membrane dystrophy; ACIOL anterior chamber intraocular lens; IOL intraocular lens; PCIOL posterior chamber intraocular lens; Phaco/IOL phacoemulsification with intraocular lens placement; [place] photorefractive keratectomy; LASIK laser assisted in situ keratomileusis; HTN hypertension; DM diabetes mellitus; COPD chronic  obstructive pulmonary disease

## 2021-01-24 ENCOUNTER — Ambulatory Visit (HOSPITAL_COMMUNITY): Admission: RE | Admit: 2021-01-24 | Payer: Medicaid Other | Source: Home / Self Care | Admitting: Ophthalmology

## 2021-01-24 ENCOUNTER — Encounter (HOSPITAL_COMMUNITY): Admission: RE | Payer: Self-pay | Source: Home / Self Care

## 2021-01-24 ENCOUNTER — Other Ambulatory Visit: Payer: Self-pay

## 2021-01-24 ENCOUNTER — Encounter (INDEPENDENT_AMBULATORY_CARE_PROVIDER_SITE_OTHER): Payer: Self-pay | Admitting: Ophthalmology

## 2021-01-24 ENCOUNTER — Ambulatory Visit (INDEPENDENT_AMBULATORY_CARE_PROVIDER_SITE_OTHER): Payer: Medicaid Other | Admitting: Ophthalmology

## 2021-01-24 DIAGNOSIS — H4311 Vitreous hemorrhage, right eye: Secondary | ICD-10-CM | POA: Diagnosis not present

## 2021-01-24 DIAGNOSIS — H3581 Retinal edema: Secondary | ICD-10-CM

## 2021-01-24 DIAGNOSIS — I10 Essential (primary) hypertension: Secondary | ICD-10-CM

## 2021-01-24 DIAGNOSIS — E113513 Type 2 diabetes mellitus with proliferative diabetic retinopathy with macular edema, bilateral: Secondary | ICD-10-CM

## 2021-01-24 DIAGNOSIS — H35033 Hypertensive retinopathy, bilateral: Secondary | ICD-10-CM

## 2021-01-24 SURGERY — PARS PLANA VITRECTOMY WITH 25 GAUGE
Anesthesia: General | Laterality: Right

## 2021-01-31 NOTE — Progress Notes (Signed)
[facility] Note  02/04/2021     CHIEF COMPLAINT Patient presents for Retina Follow Up   HISTORY OF PRESENT ILLNESS: Mercedes Valdez is a 25 y.o. female who presents to the clinic today for:   HPI     Retina Follow Up   Patient presents with  Diabetic Retinopathy.  In both eyes.  This started 12 days ago.  I, the attending physician,  performed the HPI with the patient and updated documentation appropriately.        Comments   Patient here for 12 days retina follow up for PDR OU. Patient states vision still the same. Has eye pain with headaches.       Last edited by Bernarda Caffey, MD on 02/04/2021  2:35 PM.    Pt is here for pre-op avastin OD, she is ready for sx on Thursday  Referring physician: Dr. Anthony Sar [address] [place], [place] [address]  HISTORICAL INFORMATION:   Selected notes from the MEDICAL RECORD NUMBER Referred by Dr. Marin Comment LEE: unsure Ocular Hx- decreased vision due to Diabetes PMH- DM    CURRENT MEDICATIONS: No current outpatient medications on file. (Ophthalmic Drugs)   No current facility-administered medications for this visit. (Ophthalmic Drugs)   Current Outpatient Medications (Other)  Medication Sig   Accu-Chek Softclix Lancets lancets To check blood sugars 4 times a day. Fasting and 2 hours after breakfast, lunch and dinner.   Blood Pressure Monitoring (BLOOD PRESSURE KIT) DEVI 1 Device by Does not apply route once a week. Please take blood pressure once a week and record in Babyscripts.   glucose blood (ACCU-CHEK GUIDE) test strip To check blood sugars 4 times a day. Fasting, and 2 hours after Breakfast, Lunch and Dinner   ibuprofen (MOTRIN IB) 200 MG tablet Take 3 tablets (600 mg total) by mouth every 6 (six) hours as needed. (Patient not taking: Reported on 01/21/2021)   insulin aspart (NOVOLOG) 100 UNIT/ML FlexPen Inject 10 Units into the skin 3 (three) times daily before meals.   insulin glargine (LANTUS  SOLOSTAR) 100 UNIT/ML Solostar Pen Inject 30 Units into the skin daily. (Patient taking differently: Inject 30 Units into the skin at bedtime.)   Insulin Pen Needle (PENTIPS) 32G X 4 MM MISC Use as directed   loratadine (CLARITIN) 10 MG tablet Take 1 tablet (10 mg total) by mouth daily. (Patient not taking: Reported on 01/21/2021)   Misc. Devices (GOJJI WEIGHT SCALE) MISC 1 Device by Does not apply route once a week. Please take weight and record in Babyscripts once a week.   No current facility-administered medications for this visit. (Other)   REVIEW OF SYSTEMS: ROS   Positive for: Endocrine, Eyes, Respiratory Negative for: Constitutional, Gastrointestinal, Neurological, Skin, Genitourinary, Musculoskeletal, HENT, Cardiovascular, Psychiatric, Allergic/Imm, Heme/Lymph Last edited by Theodore Demark, COA on 02/04/2021  1:32 PM.       ALLERGIES Allergies  Allergen Reactions   Amoxicillin Hives   Augmentin [Amoxicillin-Pot Clavulanate] Hives   Penicillins Hives    Has patient had a PCN reaction causing immediate rash, facial/tongue/throat swelling, SOB or lightheadedness with hypotension: No Has patient had a PCN reaction causing severe rash involving mucus membranes or skin necrosis: No Has patient had a PCN reaction that required hospitalization No Has patient had a PCN reaction occurring within the last 10 years: No If all of the above answers are "NO", then may proceed with Cephalosporin use.   Shellfish Allergy Swelling and Other (  See Comments)    Itching lips   Suprax [Cefixime]     Pt unsure reaction, may have been childhood reaction   Latex Rash    PAST MEDICAL HISTORY Past Medical History:  Diagnosis Date   ADHD (attention deficit hyperactivity disorder)    Asthma    Childhood   Diabetic retinopathy ([place])    Elevated blood pressure reading without diagnosis of hypertension    Endometrial mass    History of 2019 novel coronavirus disease (COVID-19) 03/09/2020    positive result in epic,  per pt mild to moderate symptoms that resolved   History of asthma    child   History of gonorrhea 2016   Hypertensive retinopathy    Insulin dependent type 2 diabetes mellitus ([place])    followd by pcp---- (11-16-2020 per pt checks blood sugar at home 4-5 times daily,  fasting sugar--- 70--95)   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 See comment>>a molar pregnancy is not excluded; correlation with beta-HCG is  recommended.  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   Past Surgical History:  Procedure Laterality Date   DILATATION & CURETTAGE/HYSTEROSCOPY WITH MYOSURE N/A 11/21/2020   Procedure: [place];  Surgeon: Aletha Halim, MD;  Location: [facility];  Service: Gynecology;  Laterality: N/A;   KNEE ARTHROSCOPY W/ ACL RECONSTRUCTION Left 2011   TYMPANOSTOMY TUBE PLACEMENT Bilateral    child   FAMILY HISTORY Family History  Problem Relation Age of Onset   Diabetes Mother    Vision loss Mother    ADD / ADHD Brother    Allergies Brother    Asthma Brother    Vision loss Brother    Cancer Paternal Grandfather        Colon Cancer   Heart disease Father 15       died from MI at age 94   SOCIAL HISTORY Social History   Tobacco Use   Smoking status: Every Day    Packs/day: 0.25    Years: 6.00    Pack years: 1.50    Types: Cigarettes   Smokeless tobacco: Never   Tobacco comments:    11-16-2020  per pt 3 cig per day  Vaping Use   Vaping Use: Former   Devices: uses different types  Substance Use Topics   Alcohol use: Yes    Comment:  socially   Drug use: Not Currently    Types: Marijuana         OPHTHALMIC EXAM:  Base Eye Exam     Visual Acuity (Snellen - Linear)       Right Left   Dist [place] CF at 3' 20/20   Dist ph [place] NI          Tonometry (Tonopen, 1:29 PM)       Right Left   Pressure 20 21         Pupils       Dark Light Shape React APD   Right 4 3.5 Round Minimal +1   Left 4 3 Round Brisk None         Visual Fields  Left Right    Full    Restrictions  Total superior temporal, inferior temporal, inferior nasal deficiencies         Extraocular Movement       Right Left    Full, Ortho Full, Ortho         Neuro/Psych     Oriented x3: Yes   Mood/Affect: Normal         Dilation     Both eyes: 1.0% Mydriacyl, 2.5% Phenylephrine @ 1:29 PM           Slit Lamp and Fundus Exam     External Exam       Right Left   External Normal Normal         Slit Lamp Exam       Right Left   Lids/Lashes Normal Normal   Conjunctiva/Sclera melanosis melanosis   Cornea trace PEE trace Punctate epithelial erosions, mild tear film debris   Anterior Chamber deep and clear, narrow temporal angle deep and clear, narrow temporal angle   Iris round and poorly dilated,  no NVI Round and moderateley dilated   Lens 1-2+ cortical 1-2+ cortical   Vitreous mild syneresis, +pigment, scattered peripheral blood-stained condensations greatest inferiorly mild syneresis         Fundus Exam       Right Left   Disc +NVD - regressing, +fibrosis pink and sharp, no NVE   C/D Ratio  0.2   Macula blunted foveal reflex, fibrosis, +NVE -- some regression, ERM  with striae, scattered MA/DBH, +cystic changes, superior fibrosis flat, good reflex, scattered MA   Vessels Tortuous, Vascular attenuation, +NV, AV crossing changes, progression of fibrosis greatest along ST arcades attenuated, tortuous, AV crossing chagnes   Periphery attached, pre-retinal heme just above ST arcades, inferior periphery  obscured by VH, 360 PRP, room for fill in attached, scattered MA, CWS about the disc, good 360 PRP laser changes            IMAGING AND PROCEDURES  Imaging and Procedures for 02/04/2021  OCT, Retina - OU - Both Eyes       Right Eye Quality was good. Central Foveal Thickness: 343. Progression has been stable. Findings include abnormal foveal contour, intraretinal fluid, no SRF, epiretinal membrane, intraretinal hyper-reflective material, preretinal fibrosis, vitreous traction, vitreomacular adhesion (Persistent, severe tractional edema -- greatest along ST arcades).   Left Eye Quality was good. Central Foveal Thickness: 271. Progression has been stable. Findings include normal foveal contour, no SRF, no IRF, intraretinal hyper-reflective material (Trace cystic changes, trace ERM).   Notes *Images captured and stored on drive  Diagnosis / Impression:  OD: Persistent, severe tractional edema -- greatest along ST arcades OS: NFP, no IRF/SRF; trace cystic changes and trace ERM  Clinical management:  See below  Abbreviations: NFP - Normal foveal profile. CME - cystoid macular edema. PED - pigment epithelial detachment. IRF - intraretinal fluid. SRF - subretinal fluid. EZ - ellipsoid zone. ERM - epiretinal membrane. ORA - outer retinal atrophy. ORT - outer retinal tubulation. SRHM - subretinal hyper-reflective material. IRHM - intraretinal hyper-reflective material      Intravitreal Injection, Pharmacologic Agent - OD - Right Eye       Time Out 02/04/2021. 2:17 PM. Confirmed correct patient, procedure, site, and patient consented.   Anesthesia Topical anesthesia was used. Anesthetic medications included Lidocaine 2%, Proparacaine 0.5%.   Procedure Preparation included 5% betadine to ocular surface, eyelid speculum. A supplied needle was used.   Injection: 1.25  mg Bevacizumab 1.57m/0.05ml   Route: Intravitreal, Site: Right Eye   NDC:: 70017-494-49 Lot: [id number] , Expiration  date: 02/20/2021, Waste: 0 mL   Post-op Post injection exam found visual acuity of at least counting fingers. The patient tolerated the procedure well. There were no complications. The patient received written and verbal post procedure care education. Post injection medications were not given.            ASSESSMENT/PLAN:    ICD-10-CM   1. Proliferative diabetic retinopathy of both eyes with macular edema associated with type 2 diabetes mellitus (HCC)  E10/11/2022Intravitreal Injection, Pharmacologic Agent - OD - Right Eye    Bevacizumab (AVASTIN) SOLN 1.25 mg    2. Vitreous hemorrhage, right eye (HCC)  H43.11     3. Retinal edema  H35.81 OCT, Retina - OU - Both Eyes    4. Essential hypertension  I10     5. Hypertensive retinopathy of both eyes  H35.033       1-3. 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 edema and VH; OS without DME - FA (07.01.22) shows +NV OU (OD >> OS); late-leaking MA, vascular nonperfusion -- would benefit from PRP OU - s/p IVA OD #1 (07.15.22) - s/p PRP OD (06.17.22) - s/p PRP OS (07.29.22) - BCVA OD CF from 20/150 at initial presentation; OS 20/20 - OCT shows OD: severe superior fibrosis and tractional edema - discussed findings and guarded prognosis OD - recommend PPV OD with membrane peel of preretinal fibrosis/ERM, endolaser and gas vs oil, discussed need for face down positioning after surgery - discussed possible need for additional surgery following PPV  - recommend IVA OD #2 today, 09.12.22 - pt wishes to proceed with injection - RBA of procedure discussed, questions answered - informed consent obtained and signed - see procedure note - P[facility] with membrane peel OD [facility] - RBA of procedure discussed, questions answered, consent form signed 08.12.2022 - f/u September 16, 815am -- POV  4,5. Hypertensive retinopathy OU - discussed importance of tight BP control - monitor   Ophthalmic Meds Ordered  this visit:  Meds ordered this encounter  Medications   Bevacizumab (AVASTIN) SOLN 1.25 mg      Return for as scheduled.  There are no Patient Instructions on file for this visit.  This document serves as a record of services personally performed by [id number] MD, PhD. It was created on their behalf by AGardiner Sleeper CBoyden an ophthalmic technician. The creation of this record is the provider's dictation and/or activities during the visit.    Electronically signed by: [address] COA @TODAY @ 2:37 PM  BLeeann Must M.D., Ph.D. Diseases & Surgery of the Retina and Vitreous [facility]09/04/2021   I have reviewed the above documentation for accuracy and completeness, and I agree with the above. B22/04/2021 M.D., Ph.D. 02/04/21 2:37 PM   Abbreviations: M myopia (nearsighted); A astigmatism; H hyperopia (farsighted); P presbyopia; Mrx spectacle prescription;  CTL contact lenses; OD right eye; OS left eye; OU both eyes  XT exotropia; ET esotropia; PEK punctate epithelial keratitis; PEE punctate epithelial erosions; DES dry eye syndrome; MGD meibomian gland dysfunction; ATs artificial tears; PFAT's preservative free artificial tears; NLos Berrosnuclear sclerotic cataract; PSC posterior subcapsular cataract; ERM epi-retinal membrane; PVD posterior vitreous detachment; RD retinal detachment; DM diabetes mellitus; DR diabetic retinopathy; NPDR non-proliferative diabetic retinopathy; PDR proliferative diabetic retinopathy; CSME clinically significant macular edema;  DME diabetic macular edema; dbh dot blot hemorrhages; CWS cotton wool spot; POAG primary open angle glaucoma; C/D cup-to-disc ratio; HVF humphrey visual field; GVF goldmann visual field; OCT optical coherence tomography; IOP intraocular pressure; BRVO Branch retinal vein occlusion; CRVO central retinal vein occlusion; CRAO central retinal artery occlusion; BRAO branch retinal artery occlusion; RT retinal  tear; SB scleral buckle; PPV pars plana vitrectomy; VH Vitreous hemorrhage; PRP panretinal laser photocoagulation; IVK intravitreal kenalog; VMT vitreomacular traction; MH Macular hole;  NVD neovascularization of the disc; NVE neovascularization elsewhere; AREDS age related eye disease study; ARMD age related macular degeneration; POAG primary open angle glaucoma; EBMD epithelial/anterior basement membrane dystrophy; ACIOL anterior chamber intraocular lens; IOL intraocular lens; PCIOL posterior chamber intraocular lens; Phaco/IOL phacoemulsification with intraocular lens placement; [place] photorefractive keratectomy; LASIK laser assisted in situ keratomileusis; HTN hypertension; DM diabetes mellitus; COPD chronic obstructive pulmonary disease

## 2021-02-04 ENCOUNTER — Ambulatory Visit (INDEPENDENT_AMBULATORY_CARE_PROVIDER_SITE_OTHER): Payer: Medicaid Other | Admitting: Ophthalmology

## 2021-02-04 ENCOUNTER — Other Ambulatory Visit: Payer: Self-pay

## 2021-02-04 ENCOUNTER — Encounter (INDEPENDENT_AMBULATORY_CARE_PROVIDER_SITE_OTHER): Payer: Self-pay | Admitting: Ophthalmology

## 2021-02-04 DIAGNOSIS — E113412 Type 2 diabetes mellitus with severe nonproliferative diabetic retinopathy with macular edema, left eye: Secondary | ICD-10-CM

## 2021-02-04 DIAGNOSIS — H4311 Vitreous hemorrhage, right eye: Secondary | ICD-10-CM | POA: Diagnosis not present

## 2021-02-04 DIAGNOSIS — E113513 Type 2 diabetes mellitus with proliferative diabetic retinopathy with macular edema, bilateral: Secondary | ICD-10-CM

## 2021-02-04 DIAGNOSIS — E113511 Type 2 diabetes mellitus with proliferative diabetic retinopathy with macular edema, right eye: Secondary | ICD-10-CM

## 2021-02-04 DIAGNOSIS — H35033 Hypertensive retinopathy, bilateral: Secondary | ICD-10-CM

## 2021-02-04 DIAGNOSIS — I10 Essential (primary) hypertension: Secondary | ICD-10-CM

## 2021-02-04 DIAGNOSIS — H3581 Retinal edema: Secondary | ICD-10-CM

## 2021-02-04 MED ORDER — BEVACIZUMAB CHEMO INJECTION 1.25MG/0.05ML SYRINGE FOR KALEIDOSCOPE
1.2500 mg | INTRAVITREAL | Status: AC | PRN
  Administered 2021-02-04: 1.25 mg via INTRAVITREAL

## 2021-02-07 ENCOUNTER — Encounter (HOSPITAL_COMMUNITY)
Admission: RE | Disposition: A | Discharge status: Home / Self Care | Payer: Self-pay | Source: Home / Self Care | Attending: Ophthalmology

## 2021-02-07 ENCOUNTER — Ambulatory Visit (HOSPITAL_COMMUNITY)
Admission: RE | Admit: 2021-02-07 | Discharge: 2021-02-07 | Disposition: A | Discharge status: Home / Self Care | Payer: Medicaid Other | Attending: Ophthalmology | Admitting: Ophthalmology

## 2021-02-07 ENCOUNTER — Encounter (HOSPITAL_COMMUNITY): Payer: Self-pay | Admitting: Ophthalmology

## 2021-02-07 ENCOUNTER — Ambulatory Visit (HOSPITAL_COMMUNITY): Payer: Medicaid Other | Admitting: Certified Registered Nurse Anesthetist

## 2021-02-07 DIAGNOSIS — E113521 Type 2 diabetes mellitus with proliferative diabetic retinopathy with traction retinal detachment involving the macula, right eye: Secondary | ICD-10-CM | POA: Diagnosis not present

## 2021-02-07 DIAGNOSIS — Z8616 Personal history of COVID-19: Secondary | ICD-10-CM | POA: Insufficient documentation

## 2021-02-07 DIAGNOSIS — F17210 Nicotine dependence, cigarettes, uncomplicated: Secondary | ICD-10-CM | POA: Insufficient documentation

## 2021-02-07 DIAGNOSIS — Z880 Allergy status to penicillin: Secondary | ICD-10-CM | POA: Insufficient documentation

## 2021-02-07 DIAGNOSIS — E113593 Type 2 diabetes mellitus with proliferative diabetic retinopathy without macular edema, bilateral: Secondary | ICD-10-CM | POA: Insufficient documentation

## 2021-02-07 DIAGNOSIS — E113513 Type 2 diabetes mellitus with proliferative diabetic retinopathy with macular edema, bilateral: Secondary | ICD-10-CM | POA: Diagnosis not present

## 2021-02-07 DIAGNOSIS — Z794 Long term (current) use of insulin: Secondary | ICD-10-CM | POA: Diagnosis not present

## 2021-02-07 DIAGNOSIS — H4311 Vitreous hemorrhage, right eye: Secondary | ICD-10-CM | POA: Diagnosis not present

## 2021-02-07 DIAGNOSIS — Z833 Family history of diabetes mellitus: Secondary | ICD-10-CM | POA: Diagnosis not present

## 2021-02-07 DIAGNOSIS — H3341 Traction detachment of retina, right eye: Secondary | ICD-10-CM | POA: Insufficient documentation

## 2021-02-07 DIAGNOSIS — Z881 Allergy status to other antibiotic agents status: Secondary | ICD-10-CM | POA: Insufficient documentation

## 2021-02-07 HISTORY — PX: PHOTOCOAGULATION WITH LASER: SHX6027

## 2021-02-07 HISTORY — PX: INJECTION OF SILICONE OIL: SHX6422

## 2021-02-07 HISTORY — PX: PARS PLANA VITRECTOMY: SHX2166

## 2021-02-07 HISTORY — PX: PERFLUORONE INJECTION: SHX5302

## 2021-02-07 HISTORY — PX: MEMBRANE PEEL: SHX5967

## 2021-02-07 LAB — POCT PREGNANCY, URINE: Preg Test, Ur: NEGATIVE

## 2021-02-07 LAB — GLUCOSE, CAPILLARY: Glucose-Capillary: 277 mg/dL — ABNORMAL HIGH (ref 70–99)

## 2021-02-07 SURGERY — PARS PLANA VITRECTOMY WITH 25 GAUGE
Anesthesia: General | Site: Eye | Laterality: Right

## 2021-02-07 MED ORDER — STERILE WATER FOR INJECTION IJ SOLN
INTRAMUSCULAR | Status: AC
  Filled 2021-02-07: qty 10

## 2021-02-07 MED ORDER — SODIUM CHLORIDE 0.9 % IV SOLN
INTRAVENOUS | Status: DC | PRN
Start: 2021-02-07 — End: 2021-02-07

## 2021-02-07 MED ORDER — OXYCODONE HCL 5 MG PO TABS: 5.0000 mg | ORAL_TABLET | Freq: Once | ORAL | Status: DC | PRN

## 2021-02-07 MED ORDER — BUPIVACAINE HCL (PF) 0.75 % IJ SOLN
INTRAMUSCULAR | Status: AC
  Filled 2021-02-07: qty 10

## 2021-02-07 MED ORDER — FENTANYL CITRATE (PF) 250 MCG/5ML IJ SOLN
INTRAMUSCULAR | Status: AC
  Filled 2021-02-07: qty 5

## 2021-02-07 MED ORDER — PROPOFOL 10 MG/ML IV BOLUS
INTRAVENOUS | Status: AC
  Filled 2021-02-07: qty 20

## 2021-02-07 MED ORDER — GATIFLOXACIN 0.5 % OP SOLN
OPHTHALMIC | Status: AC
  Filled 2021-02-07: qty 2.5

## 2021-02-07 MED ORDER — CEFTAZIDIME 1 G IJ SOLR
INTRAMUSCULAR | Status: AC
  Filled 2021-02-07: qty 1

## 2021-02-07 MED ORDER — LIDOCAINE HCL (PF) 2 % IJ SOLN
INTRAMUSCULAR | Status: AC
  Filled 2021-02-07: qty 10

## 2021-02-07 MED ORDER — PROPARACAINE HCL 0.5 % OP SOLN
1.0000 [drp] | OPHTHALMIC | Status: AC | PRN
  Administered 2021-02-07 (×3): 1 [drp] via OPHTHALMIC
  Filled 2021-02-07: qty 15

## 2021-02-07 MED ORDER — SODIUM CHLORIDE (PF) 0.9 % IJ SOLN
INTRAMUSCULAR | Status: AC
  Filled 2021-02-07: qty 10

## 2021-02-07 MED ORDER — BRIMONIDINE TARTRATE 0.2 % OP SOLN
OPHTHALMIC | Status: AC
  Filled 2021-02-07: qty 5

## 2021-02-07 MED ORDER — TRIAMCINOLONE ACETONIDE 40 MG/ML IJ SUSP
INTRAMUSCULAR | Status: AC
  Filled 2021-02-07: qty 5

## 2021-02-07 MED ORDER — LIDOCAINE HCL (PF) 4 % IJ SOLN
INTRAMUSCULAR | Status: AC
  Filled 2021-02-07: qty 5

## 2021-02-07 MED ORDER — BEVACIZUMAB CHEMO INJECTION 1.25MG/0.05ML SYRINGE FOR KALEIDOSCOPE
1.2500 mg | Freq: Once | INTRAVITREAL | Status: AC
  Administered 2021-02-07: 1.25 mg via INTRAVITREAL
  Filled 2021-02-07: qty 0.1

## 2021-02-07 MED ORDER — EPINEPHRINE PF 1 MG/ML IJ SOLN
INTRAOCULAR | Status: DC | PRN
  Administered 2021-02-07 (×2): 500 mL

## 2021-02-07 MED ORDER — STERILE WATER FOR IRRIGATION IR SOLN
Status: DC | PRN
  Administered 2021-02-07: 1000 mL

## 2021-02-07 MED ORDER — BACITRACIN-POLYMYXIN B 500-10000 UNIT/GM OP OINT
TOPICAL_OINTMENT | OPHTHALMIC | Status: AC
  Filled 2021-02-07: qty 3.5

## 2021-02-07 MED ORDER — CHLORHEXIDINE GLUCONATE 0.12 % MT SOLN
15.0000 mL | Freq: Once | OROMUCOSAL | Status: AC
  Administered 2021-02-07: 15 mL via OROMUCOSAL
  Filled 2021-02-07: qty 15

## 2021-02-07 MED ORDER — TRIAMCINOLONE ACETONIDE 40 MG/ML IJ SUSP
INTRAMUSCULAR | Status: DC | PRN
  Administered 2021-02-07: .5 mL
  Administered 2021-02-07: 40 mg

## 2021-02-07 MED ORDER — DEXAMETHASONE SODIUM PHOSPHATE 10 MG/ML IJ SOLN
INTRAMUSCULAR | Status: AC
  Filled 2021-02-07: qty 1

## 2021-02-07 MED ORDER — MIDAZOLAM HCL 2 MG/2ML IJ SOLN
INTRAMUSCULAR | Status: AC
  Filled 2021-02-07: qty 2

## 2021-02-07 MED ORDER — ACETAZOLAMIDE SODIUM 500 MG IJ SOLR
INTRAMUSCULAR | Status: AC
  Filled 2021-02-07: qty 500

## 2021-02-07 MED ORDER — TROPICAMIDE 1 % OP SOLN
1.0000 [drp] | OPHTHALMIC | Status: AC | PRN
  Administered 2021-02-07 (×3): 1 [drp] via OPHTHALMIC
  Filled 2021-02-07: qty 15

## 2021-02-07 MED ORDER — ACETAMINOPHEN 10 MG/ML IV SOLN
1000.0000 mg | Freq: Once | INTRAVENOUS | Status: DC | PRN
  Administered 2021-02-07: 1000 mg via INTRAVENOUS

## 2021-02-07 MED ORDER — PHENYLEPHRINE HCL 10 % OP SOLN
1.0000 [drp] | OPHTHALMIC | Status: AC | PRN
  Administered 2021-02-07 (×3): 1 [drp] via OPHTHALMIC
  Filled 2021-02-07: qty 5

## 2021-02-07 MED ORDER — LIDOCAINE 2% (20 MG/ML) 5 ML SYRINGE
INTRAMUSCULAR | Status: DC | PRN
  Administered 2021-02-07: 60 mg via INTRAVENOUS

## 2021-02-07 MED ORDER — ATROPINE SULFATE 1 % OP SOLN
OPHTHALMIC | Status: AC
  Filled 2021-02-07: qty 5

## 2021-02-07 MED ORDER — TOBRAMYCIN-DEXAMETHASONE 0.3-0.1 % OP OINT
TOPICAL_OINTMENT | OPHTHALMIC | Status: AC
  Filled 2021-02-07: qty 3.5

## 2021-02-07 MED ORDER — NA CHONDROIT SULF-NA HYALURON 40-30 MG/ML IO SOSY
INTRAOCULAR | Status: AC
  Filled 2021-02-07: qty 1

## 2021-02-07 MED ORDER — PREDNISOLONE ACETATE 1 % OP SUSP
OPHTHALMIC | Status: DC | PRN
  Administered 2021-02-07: 1 [drp] via OPHTHALMIC

## 2021-02-07 MED ORDER — BRIMONIDINE TARTRATE 0.2 % OP SOLN
OPHTHALMIC | Status: DC | PRN
  Administered 2021-02-07: 1 [drp] via OPHTHALMIC

## 2021-02-07 MED ORDER — BSS PLUS IO SOLN
INTRAOCULAR | Status: AC
  Filled 2021-02-07: qty 500

## 2021-02-07 MED ORDER — ATROPINE SULFATE 1 % OP SOLN
1.0000 [drp] | OPHTHALMIC | Status: AC | PRN
  Administered 2021-02-07 (×3): 1 [drp] via OPHTHALMIC
  Filled 2021-02-07: qty 5

## 2021-02-07 MED ORDER — DEXAMETHASONE SODIUM PHOSPHATE 4 MG/ML IJ SOLN
INTRAMUSCULAR | Status: DC | PRN
  Administered 2021-02-07: 4 mg via INTRAVENOUS

## 2021-02-07 MED ORDER — ACETAMINOPHEN 10 MG/ML IV SOLN
INTRAVENOUS | Status: AC
  Filled 2021-02-07: qty 100

## 2021-02-07 MED ORDER — STERILE WATER FOR INJECTION IJ SOLN
INTRAMUSCULAR | Status: DC | PRN
  Administered 2021-02-07: .5 mL

## 2021-02-07 MED ORDER — SUGAMMADEX SODIUM 200 MG/2ML IV SOLN
INTRAVENOUS | Status: DC | PRN
  Administered 2021-02-07: 200 mg via INTRAVENOUS

## 2021-02-07 MED ORDER — ORAL CARE MOUTH RINSE: 15.0000 mL | Freq: Once | OROMUCOSAL | Status: AC

## 2021-02-07 MED ORDER — NA CHONDROIT SULF-NA HYALURON 40-30 MG/ML IO SOSY
INTRAOCULAR | Status: DC | PRN
  Administered 2021-02-07: 0.5 mL via INTRAOCULAR

## 2021-02-07 MED ORDER — FENTANYL CITRATE (PF) 100 MCG/2ML IJ SOLN: 25.0000 ug | INTRAMUSCULAR | Status: DC | PRN

## 2021-02-07 MED ORDER — DORZOLAMIDE HCL-TIMOLOL MAL 2-0.5 % OP SOLN
OPHTHALMIC | Status: AC
  Filled 2021-02-07: qty 10

## 2021-02-07 MED ORDER — OXYCODONE HCL 5 MG/5ML PO SOLN: 5.0000 mg | Freq: Once | ORAL | Status: DC | PRN

## 2021-02-07 MED ORDER — INSULIN ASPART 100 UNIT/ML IJ SOLN
8.0000 [IU] | Freq: Once | INTRAMUSCULAR | Status: AC
  Administered 2021-02-07: 8 [IU] via SUBCUTANEOUS

## 2021-02-07 MED ORDER — GATIFLOXACIN 0.5 % OP SOLN OPTIME - NO CHARGE
OPHTHALMIC | Status: DC | PRN
  Administered 2021-02-07: 1 [drp] via OPHTHALMIC

## 2021-02-07 MED ORDER — LACTATED RINGERS IV SOLN: INTRAVENOUS | Status: DC

## 2021-02-07 MED ORDER — ROCURONIUM BROMIDE 10 MG/ML (PF) SYRINGE
PREFILLED_SYRINGE | INTRAVENOUS | Status: DC | PRN
  Administered 2021-02-07: 10 mg via INTRAVENOUS
  Administered 2021-02-07: 60 mg via INTRAVENOUS
  Administered 2021-02-07 (×3): 20 mg via INTRAVENOUS

## 2021-02-07 MED ORDER — ONDANSETRON HCL 4 MG/2ML IJ SOLN
INTRAMUSCULAR | Status: AC
  Filled 2021-02-07: qty 2

## 2021-02-07 MED ORDER — ATROPINE SULFATE 1 % OP SOLN
OPHTHALMIC | Status: DC | PRN
  Administered 2021-02-07: 1 [drp] via OPHTHALMIC

## 2021-02-07 MED ORDER — DORZOLAMIDE HCL-TIMOLOL MAL 2-0.5 % OP SOLN
OPHTHALMIC | Status: DC | PRN
  Administered 2021-02-07: 1 [drp] via OPHTHALMIC

## 2021-02-07 MED ORDER — BACITRACIN-POLYMYXIN B 500-10000 UNIT/GM OP OINT
TOPICAL_OINTMENT | OPHTHALMIC | Status: DC | PRN
  Administered 2021-02-07: 1 via OPHTHALMIC

## 2021-02-07 MED ORDER — ONDANSETRON HCL 4 MG/2ML IJ SOLN
4.0000 mg | Freq: Once | INTRAMUSCULAR | Status: AC | PRN
  Administered 2021-02-07: 4 mg via INTRAVENOUS

## 2021-02-07 MED ORDER — PROPOFOL 10 MG/ML IV BOLUS
INTRAVENOUS | Status: DC | PRN
  Administered 2021-02-07: 180 mg via INTRAVENOUS

## 2021-02-07 MED ORDER — POLYMYXIN B SULFATE 500000 UNITS IJ SOLR
INTRAMUSCULAR | Status: AC
  Filled 2021-02-07: qty 500000

## 2021-02-07 MED ORDER — BSS IO SOLN
INTRAOCULAR | Status: AC
  Filled 2021-02-07: qty 15

## 2021-02-07 MED ORDER — CARBACHOL 0.01 % IO SOLN
INTRAOCULAR | Status: AC
  Filled 2021-02-07: qty 1.5

## 2021-02-07 MED ORDER — EPINEPHRINE PF 1 MG/ML IJ SOLN
INTRAMUSCULAR | Status: AC
  Filled 2021-02-07: qty 1

## 2021-02-07 MED ORDER — PREDNISOLONE ACETATE 1 % OP SUSP
OPHTHALMIC | Status: AC
  Filled 2021-02-07: qty 5

## 2021-02-07 MED ORDER — FENTANYL CITRATE (PF) 100 MCG/2ML IJ SOLN
INTRAMUSCULAR | Status: DC | PRN
  Administered 2021-02-07 (×4): 50 ug via INTRAVENOUS

## 2021-02-07 MED ORDER — MIDAZOLAM HCL 5 MG/5ML IJ SOLN
INTRAMUSCULAR | Status: DC | PRN
  Administered 2021-02-07: 2 mg via INTRAVENOUS

## 2021-02-07 MED ORDER — ONDANSETRON HCL 4 MG/2ML IJ SOLN
INTRAMUSCULAR | Status: DC | PRN
  Administered 2021-02-07: 4 mg via INTRAVENOUS

## 2021-02-07 SURGICAL SUPPLY — 33 items
APPLICATOR COTTON TIP 6 STRL (MISCELLANEOUS) ×4 IMPLANT
APPLICATOR COTTON TIP 6IN STRL (MISCELLANEOUS) ×8
CABLE BIPOLOR RESECTION CORD (MISCELLANEOUS) ×2 IMPLANT
CANNULA DUALBORE 25G (CANNULA) ×2 IMPLANT
CANNULA FLEX TIP 25G (CANNULA) ×2 IMPLANT
CLSR STERI-STRIP ANTIMIC 1/2X4 (GAUZE/BANDAGES/DRESSINGS) ×2 IMPLANT
DRAPE OPHTHALMIC 77X100 STRL (CUSTOM PROCEDURE TRAY) ×2 IMPLANT
FORCEPS GRIESHABER ILM 25G A (INSTRUMENTS) ×2 IMPLANT
FORCEPS GRIESHABER MAX 25G (MISCELLANEOUS) ×2 IMPLANT
GLOVE SURG ENC MOIS LTX SZ7.5 (GLOVE) ×4 IMPLANT
GLOVE SURG ENC TEXT LTX SZ7 (GLOVE) ×2 IMPLANT
GOWN STRL REUS W/ TWL LRG LVL3 (GOWN DISPOSABLE) ×2 IMPLANT
GOWN STRL REUS W/ TWL XL LVL3 (GOWN DISPOSABLE) ×1 IMPLANT
GOWN STRL REUS W/TWL LRG LVL3 (GOWN DISPOSABLE) ×2
GOWN STRL REUS W/TWL XL LVL3 (GOWN DISPOSABLE) ×1
KIT BASIN OR (CUSTOM PROCEDURE TRAY) ×2 IMPLANT
KIT PERFLUORON PROCEDURE 5ML (MISCELLANEOUS) ×2 IMPLANT
LENS VITRECTOMY FLAT OCLR DISP (MISCELLANEOUS) ×2 IMPLANT
NEEDLE 18GX1X1/2 (RX/OR ONLY) (NEEDLE) ×2 IMPLANT
NS IRRIG 1000ML POUR BTL (IV SOLUTION) ×2 IMPLANT
OIL SILICONE OPHTHALMIC ADAPTO (Ophthalmic Related) ×2 IMPLANT
PACK VITRECTOMY CUSTOM (CUSTOM PROCEDURE TRAY) ×2 IMPLANT
PAK PIK VITRECTOMY CVS 25GA (OPHTHALMIC) ×2 IMPLANT
PROBE ENDO DIATHERMY 25G (MISCELLANEOUS) ×2 IMPLANT
PROBE LASER ILLUM FLEX CVD 25G (OPHTHALMIC) ×2 IMPLANT
SCISSORS TIP ADVANCED DSP 25GA (INSTRUMENTS) ×2 IMPLANT
SET INJECTOR OIL FLUID CONSTEL (OPHTHALMIC) ×2 IMPLANT
SHIELD EYE LENSE ONLY DISP (GAUZE/BANDAGES/DRESSINGS) ×2 IMPLANT
SUT VICRYL 7 0 TG140 8 (SUTURE) ×2 IMPLANT
SYR 10ML LL (SYRINGE) ×2 IMPLANT
SYR TB 1ML LUER SLIP (SYRINGE) ×4 IMPLANT
TOWEL GREEN STERILE FF (TOWEL DISPOSABLE) ×2 IMPLANT
WATER STERILE IRR 1000ML POUR (IV SOLUTION) ×2 IMPLANT

## 2021-02-07 NOTE — Interval H&P Note (Signed)
History and Physical Interval Note:  02/07/2021 10:42 AM  Mercedes Valdez  has presented today for surgery, with the diagnosis of Proliferative Diabetic Retinopathy right eye.  The various methods of treatment have been discussed with the patient and family. After consideration of risks, benefits and other options for treatment, the patient has consented to  Procedure(s): PARS PLANA VITRECTOMY WITH 25 GAUGE (Right) as a surgical intervention.  The patient's history has been reviewed, patient examined, no change in status, stable for surgery.  I have reviewed the patient's chart and labs.  Questions were answered to the patient's satisfaction.     Bernarda Caffey

## 2021-02-07 NOTE — Transfer of Care (Signed)
Immediate Anesthesia Transfer of Care Note  Patient: Mercedes Valdez  Procedure(s) Performed: TWENTY-FIVE  GAUGE PARS PLANA VITRECTOMY (Right: Eye) MEMBRANE PEEL (Right: Eye) PHOTOCOAGULATION WITH LASER (Right: Eye) INJECTION OF SILICONE OIL (Right: Eye) PERFLUORON INJECTION (Right: Eye)  Patient Location: PACU  Anesthesia Type:General  Level of Consciousness: awake and alert   Airway & Oxygen Therapy: Patient Spontanous Breathing  Post-op Assessment: Report given to RN, Post -op Vital signs reviewed and stable and Patient moving all extremities X 4  Post vital signs: Reviewed and stable  Last Vitals:  Vitals Value Taken Time  BP    Temp    Pulse 99 02/07/21 1537  Resp    SpO2 100 % 02/07/21 1537  Vitals shown include unvalidated device data.  Last Pain:  Vitals:   02/07/21 1041  TempSrc:   PainSc: 0-No pain         Complications: No notable events documented.

## 2021-02-07 NOTE — Brief Op Note (Signed)
02/07/2021  3:55 PM  PATIENT:  Mercedes Valdez  25 y.o. female  PRE-OPERATIVE DIAGNOSIS:  Proliferative Diabetic Retinopathy right eye  POST-OPERATIVE DIAGNOSIS:  Proliferative Diabetic Retinopathy right eye  PROCEDURE:  Procedure(s): TWENTY-FIVE  GAUGE PARS PLANA VITRECTOMY (Right) MEMBRANE PEEL (Right) PHOTOCOAGULATION WITH LASER (Right) INJECTION OF SILICONE OIL (Right) PERFLUORON INJECTION (Right)  SURGEON:  Surgeon(s) and Role:    Bernarda Caffey, MD - Primary  ASSISTANTS: Ernest Mallick, Ophthalmic Assistant    ANESTHESIA:   general  EBL:  10 mL   BLOOD ADMINISTERED:none  DRAINS: none   LOCAL MEDICATIONS USED:  NONE  SPECIMEN:  No Specimen  DISPOSITION OF SPECIMEN:  N/A  COUNTS:  YES  TOURNIQUET:  * No tourniquets in log *  DICTATION: .Note written in EPIC  PLAN OF CARE: Discharge to home after PACU  PATIENT DISPOSITION:  PACU - hemodynamically stable.   Delay start of Pharmacological VTE agent (>24hrs) due to surgical blood loss or risk of bleeding: not applicable

## 2021-02-07 NOTE — Progress Notes (Signed)
Patient's CBG was 277 Dr. Kalman Shan aware and orders received. Will continue to monitor.

## 2021-02-07 NOTE — Anesthesia Preprocedure Evaluation (Signed)
Anesthesia Evaluation  Patient identified by MRN, date of birth, ID band Patient awake    Reviewed: Allergy & Precautions, NPO status , Patient's Chart, lab work & pertinent test results  Airway Mallampati: II  TM Distance: >3 FB Neck ROM: Full    Dental no notable dental hx.    Pulmonary Current Smoker and Patient abstained from smoking.,    Pulmonary exam normal breath sounds clear to auscultation       Cardiovascular negative cardio ROS Normal cardiovascular exam Rhythm:Regular Rate:Normal     Neuro/Psych negative neurological ROS  negative psych ROS   GI/Hepatic negative GI ROS, Neg liver ROS,   Endo/Other  diabetes, Type 2, Insulin DependentPCOS  Renal/GU negative Renal ROS  negative genitourinary   Musculoskeletal negative musculoskeletal ROS (+)   Abdominal   Peds negative pediatric ROS (+)  Hematology negative hematology ROS (+)   Anesthesia Other Findings   Reproductive/Obstetrics negative OB ROS                             Anesthesia Physical Anesthesia Plan  ASA: 3  Anesthesia Plan: General   Post-op Pain Management:    Induction: Intravenous  PONV Risk Score and Plan: 2 and Ondansetron, Dexamethasone and Treatment may vary due to age or medical condition  Airway Management Planned: Oral ETT  Additional Equipment:   Intra-op Plan:   Post-operative Plan: Extubation in OR  Informed Consent: I have reviewed the patients History and Physical, chart, labs and discussed the procedure including the risks, benefits and alternatives for the proposed anesthesia with the patient or authorized representative who has indicated his/her understanding and acceptance.     Dental advisory given  Plan Discussed with: CRNA and Surgeon  Anesthesia Plan Comments:         Anesthesia Quick Evaluation

## 2021-02-07 NOTE — Op Note (Signed)
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 25 gauge pars plana vitrectomy, RIGHT EYE 2. Preretinal membrane peel, RIGHT EYE 3. Perfluoron injection, RIGHT EYE 4. Endolaser, RIGHT EYE 5. Fluid air exchange, RIGHT EYE 6. Injection of 9371IR silicon oil, RIGHT EYE 7. Intravitreal injection of bevacizumab, RIGHT EYE     Indications for procedure: This is a 25 yo F with a history of DM2 and HTN with Proliferative diabetic retinopathy in both eyes, and right eye with tractional retinal detachment. The patient presented with decreased vision OD. After discussing the risks, benefits, and alternatives to surgery, the patient electively decided to proceed with surgery and informed consent was obtained. The surgery was an attempt to remove all preretinal/tractional membranes from the right eye and potentially improve the vision within the reasonable expectations of the surgeon   Procedure in Detail:    The patient was met in the pre-operative holding area where their identification data was verified.  It was noted that there was a signed, informed consent in the chart and the surgical right eye were verbally verified by the patient for surgery. Then the operative right eye and was marked with the surgeon's initials. The patient was then taken to the operating room and placed in the supine position. General endotracheal anesthesia was induced.   The RIGHT EYE was then prepped with 5% betadine and draped in the normal fashion for ophthalmic surgery. The microscope was draped and swung into position, and a secondary time-out was performed to identify the correct patient, eyes, procedures, and any allergies.   A 25 gauge trocar was inserted in a 30-45 degrees fashion  into the inferonasal quadrant, 4 mm posterior to the limbus in this phakic patient. A chendlier light was placed through the trocar and secured to the drape with steri-strips. Next, the infusion trocar was inserted in an identical fashion in the inferotemporal quadrant. Correct positioning within the vitreous was verified externally. The infusion was then connected to the cannula and BSS infusion was commenced.  Additional ports were placed in the superonasal and superotemporal quadrants. Viscoat was placed on the cornea. The BIOM was used to visualize the posterior segment. The patient had significant preretinal fibrosis throughout the posterior pole, with the most severe segment being a fibrotic plaque overlying the disc and superotemporal arcades and involving the macula. A core vitrectomy was performed using the BIOM visualization system, vitrectomy probe and light pipe. Of note, the retinal vasculature was extremely sclerotic with notably ischemic appearing retina. Also there was significant blood stained vitreous in the periphery. Kenalog was used to mark the vitreous and assist in dissecting vitreous membranes.      A macular contact lens was placed on the eye. End-grasping ILM forceps, 25g intraocular scissors and the vitrectomy probe and light pipe were used to carefully peel and dissect preretinal membranes and fibrosis off the entire surface of the retina as was deemed safe.    The wide angle viewing system was brought back into position to assist in peeling peripheral membranes. There were multiple retinal breaks noted -- a linear retinal break superonasal to disc, and small stretch holes just outside the temporal arcades. All traction was removed from the breaks and the breaks were marked with diathermy. Next, perfluoron was injected into  the posterior pole to flatten the retina and push the subretinal fluid anteriorly. Endolaser was used to apply panretinal photocoagulation to the retina under  perfluoron. Next, an air fluid exchange was performed to drain the subretinal fluid and remove the intravitreal BSS. Despite almost complete removal of preretinal membranes, under air, some portions of the retina had intrinsic fibrosis that couldn't be removed safely and those areas failed to completely flatten. Panretinal photocoagulation was performed via endolaser 360 and posteriorly almost to the arcades. Then, 6122 cs silicon oil was injected via the superior temporal trocar and filled up to the level of the lens plane with the aide of the venting cannula.   The inferonasal and superonasal ports were then removed and sutured with 7-0 vicryl, there was no leakage.  Then, the superotemporal and inferotemporal ports were removed and sutured with 7-0 vicryl.  There was no leakage from the sclerotomy sites and the eye remained at physiologic pressure by digital palpation.   Subconjunctival injections of kefzol + bacitracin + polymixin b and kenalog were then administered. Next, the eye was prepped again with 5% betadine and 0.05 cc of bevacizumab was injected into the vitreous cavity via 30g needle, 59m posterior to the limbus in the superotemporal quadrant.  5% betadine was reapplied to the injection site and then rinsed out of the eye with sterile BSS. Antibiotic and steroid drops as well as antibiotic ointment were placed in the eye and then the eye was patched and shielded.  The patient was then taken to the post-operative area for recovery having tolerated the procedure well.  The pt was instructed to perform face down positioning postoperatively and to follow up in clinic the following morning as scheduled.   Estimated blood lost: minimal Complications: None

## 2021-02-07 NOTE — Anesthesia Procedure Notes (Signed)
Procedure Name: Intubation Date/Time: 02/07/2021 11:12 AM Performed by: Lieutenant Diego, CRNA Pre-anesthesia Checklist: Patient identified, Emergency Drugs available, Suction available and Patient being monitored Patient Re-evaluated:Patient Re-evaluated prior to induction Oxygen Delivery Method: Circle system utilized Preoxygenation: Pre-oxygenation with 100% oxygen Induction Type: IV induction Ventilation: Mask ventilation without difficulty Laryngoscope Size: Miller and 2 Grade View: Grade I Tube type: Oral Tube size: 7.0 mm Number of attempts: 1 Airway Equipment and Method: Stylet Placement Confirmation: ETT inserted through vocal cords under direct vision, positive ETCO2 and breath sounds checked- equal and bilateral Secured at: 22 cm Tube secured with: Tape Dental Injury: Teeth and Oropharynx as per pre-operative assessment

## 2021-02-07 NOTE — Anesthesia Postprocedure Evaluation (Signed)
Anesthesia Post Note  Patient: Mercedes Valdez  Procedure(s) Performed: TWENTY-FIVE  GAUGE PARS PLANA VITRECTOMY (Right: Eye) MEMBRANE PEEL (Right: Eye) PHOTOCOAGULATION WITH LASER (Right: Eye) INJECTION OF SILICONE OIL (Right: Eye) PERFLUORON INJECTION (Right: Eye)     Patient location during evaluation: PACU Anesthesia Type: General Level of consciousness: awake and alert Pain management: pain level controlled Vital Signs Assessment: post-procedure vital signs reviewed and stable Respiratory status: spontaneous breathing, nonlabored ventilation, respiratory function stable and patient connected to nasal cannula oxygen Cardiovascular status: blood pressure returned to baseline and stable Postop Assessment: no apparent nausea or vomiting Anesthetic complications: no   No notable events documented.  Last Vitals:  Vitals:   02/07/21 0943 02/07/21 1539  BP: 131/86 139/72  Pulse: 100 93  Resp: 18 16  Temp: 36.8 C 36.8 C  SpO2: 98% 98%    Last Pain:  Vitals:   02/07/21 1539  TempSrc:   PainSc: 0-No pain                 Revan Gendron S

## 2021-02-07 NOTE — Discharge Instructions (Signed)
POSTOPERATIVE INSTRUCTIONS  Your doctor has performed vitreoretinal surgery on you at [facility].  - Keep eye patched and shielded until seen by Dr. Canda Podgorski 8 AM tomorrow in clinic - Do not use drops until return - FACE DOWN POSITIONING WHILE AWAKE - Sleep with belly down or on left side, avoid laying flat on back.    - No strenuous bending, stooping or lifting.  - You may not drive until further notice.  - If your doctor used a gas bubble in your eye during the procedure he will advise you on postoperative positioning. If you have a gas bubble you will be wearing a green bracelet that was applied in the operating room. The green bracelet should stay on as long as the gas bubble is in your eye. While the gas bubble is present you should not fly in an airplane. If you require general anesthesia while the gas bubble is present you must notify your anesthesiologist that an intraocular gas bubble is present so he can take the appropriate precautions.  - Tylenol or any other over-the-counter pain reliever can be used according to your doctor. If more pain medicine is required, your doctor will have a prescription for you.  - You may read, go up and down stairs, and watch television.     Jerrad Mendibles, M.D., Ph.D.  

## 2021-02-08 ENCOUNTER — Other Ambulatory Visit: Payer: Self-pay

## 2021-02-08 ENCOUNTER — Ambulatory Visit (INDEPENDENT_AMBULATORY_CARE_PROVIDER_SITE_OTHER): Payer: Medicaid Other | Admitting: Ophthalmology

## 2021-02-08 ENCOUNTER — Encounter (INDEPENDENT_AMBULATORY_CARE_PROVIDER_SITE_OTHER): Payer: Self-pay | Admitting: Ophthalmology

## 2021-02-08 DIAGNOSIS — H35033 Hypertensive retinopathy, bilateral: Secondary | ICD-10-CM

## 2021-02-08 DIAGNOSIS — E113513 Type 2 diabetes mellitus with proliferative diabetic retinopathy with macular edema, bilateral: Secondary | ICD-10-CM

## 2021-02-08 DIAGNOSIS — H3581 Retinal edema: Secondary | ICD-10-CM

## 2021-02-08 DIAGNOSIS — I10 Essential (primary) hypertension: Secondary | ICD-10-CM

## 2021-02-08 DIAGNOSIS — H4311 Vitreous hemorrhage, right eye: Secondary | ICD-10-CM

## 2021-02-08 LAB — GLUCOSE, CAPILLARY: Glucose-Capillary: 207 mg/dL — ABNORMAL HIGH (ref 70–99)

## 2021-02-08 NOTE — Progress Notes (Addendum)
[facility] Note  02/08/2021     CHIEF COMPLAINT Patient presents for Post-op Follow-up   HISTORY OF PRESENT ILLNESS: Mercedes Valdez is a 25 y.o. female who presents to the clinic today for:   HPI     Post-op Follow-up   In right eye.  Discomfort includes pain.  Vision is worse, is blurred at distance and is blurred at near.  I, the attending physician,  performed the HPI with the patient and updated documentation appropriately.        Comments   25 y/o female pt here for 1 day p.o. OD.  S/p PPV/PFO/EL/FAX/SO/IVA OD 9.16.22.  Pt did not sleep at all last night, and has been in a tremendous amount of pain since yesterday afternoon.  Tried using otc Tylenol, but it had no effect.  Denies FOL, floaters.  Patch removed in office this a.m.  Pt reports NLP OD this a.m.; no change in [place] OS.      Last edited by Bernarda Caffey, MD on 02/08/2021  4:00 PM.    Pt states last night was "terrible", she states she could not sleep due to being in a lot of pain  Referring physician: Dr. Anthony Sar [address]  [place], [place] [address]  HISTORICAL INFORMATION:   Selected notes from the MEDICAL RECORD NUMBER Referred by Dr. Marin Comment LEE: unsure Ocular Hx- decreased vision due to Diabetes PMH- DM    CURRENT MEDICATIONS: No current outpatient medications on file. (Ophthalmic Drugs)   No current facility-administered medications for this visit. (Ophthalmic Drugs)   Current Outpatient Medications (Other)  Medication Sig   Accu-Chek Softclix Lancets lancets To check blood sugars 4 times a day. Fasting and 2 hours after breakfast, lunch and dinner.   Blood Pressure Monitoring (BLOOD PRESSURE KIT) DEVI 1 Device by Does not apply route once a week. Please take blood pressure once a week and record in Babyscripts.   glucose blood (ACCU-CHEK GUIDE) test strip To check blood sugars 4 times a day. Fasting, and 2 hours after Breakfast, Lunch and Dinner   ibuprofen (MOTRIN IB)  200 MG tablet Take 3 tablets (600 mg total) by mouth every 6 (six) hours as needed. (Patient not taking: Reported on 01/21/2021)   insulin aspart (NOVOLOG) 100 UNIT/ML FlexPen Inject 10 Units into the skin 3 (three) times daily before meals.   insulin glargine (LANTUS SOLOSTAR) 100 UNIT/ML Solostar Pen Inject 30 Units into the skin daily. (Patient taking differently: Inject 30 Units into the skin at bedtime.)   Insulin Pen Needle (PENTIPS) 32G X 4 MM MISC Use as directed   loratadine (CLARITIN) 10 MG tablet Take 1 tablet (10 mg total) by mouth daily. (Patient not taking: Reported on 01/21/2021)   Misc. Devices (GOJJI WEIGHT SCALE) MISC 1 Device by Does not apply route once a week. Please take weight and record in Babyscripts once a week.   No current facility-administered medications for this visit. (Other)   REVIEW OF SYSTEMS: ROS   Positive for: Endocrine, Eyes Negative for: Constitutional, Gastrointestinal, Neurological, Skin, Genitourinary, Musculoskeletal, HENT, Cardiovascular, Respiratory, Psychiatric, Allergic/Imm, Heme/Lymph Last edited by Matthew Folks, COA on 02/08/2021  8:04 AM.        ALLERGIES Allergies  Allergen Reactions   Amoxicillin Hives   Augmentin [Amoxicillin-Pot Clavulanate] Hives   Penicillins Hives    Has patient had a PCN reaction causing immediate rash, facial/tongue/throat swelling, SOB or lightheadedness with hypotension: No Has patient had a PCN  reaction causing severe rash involving mucus membranes or skin necrosis: No Has patient had a PCN reaction that required hospitalization No Has patient had a PCN reaction occurring within the last 10 years: No If all of the above answers are "NO", then may proceed with Cephalosporin use.   Shellfish Allergy Swelling and Other (See Comments)    Itching lips   Suprax [Cefixime]     Pt unsure reaction, may have been childhood reaction   Latex Rash    PAST MEDICAL HISTORY Past Medical History:  Diagnosis Date    ADHD (attention deficit hyperactivity disorder)    Asthma    Childhood   Diabetic retinopathy ([place])    Elevated blood pressure reading without diagnosis of hypertension    Endometrial mass    History of 2019 novel coronavirus disease (COVID-19) 03/09/2020   positive result in epic,  per pt mild to moderate symptoms that resolved   History of asthma    child   History of gonorrhea 2016   Hypertensive retinopathy    Insulin dependent type 2 diabetes mellitus ([place])    followd by pcp---- (11-16-2020 per pt checks blood sugar at home 4-5 times daily,  fasting sugar--- 70--95)   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 See comment>>a molar pregnancy is not excluded; correlation with beta-HCG is  recommended.  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   Past Surgical History:  Procedure Laterality Date   DILATATION & CURETTAGE/HYSTEROSCOPY WITH MYOSURE N/A 11/21/2020   Procedure: [place];  Surgeon: Aletha Halim, MD;  Location: [facility];  Service: Gynecology;  Laterality: N/A;   INJECTION OF SILICONE OIL Right 01/22/9406   Procedure: INJECTION OF SILICONE OIL;  Surgeon: Bernarda Caffey, MD;  Location: [facility];  Service: Ophthalmology;  Laterality: Right;   KNEE ARTHROSCOPY W/ ACL RECONSTRUCTION Left 2011   MEMBRANE PEEL Right 02/07/2021   Procedure: MEMBRANE PEEL;  Surgeon: Bernarda Caffey, MD;  Location: [facility];  Service: Ophthalmology;  Laterality: Right;   PARS PLANA VITRECTOMY Right 02/07/2021   Procedure: TWENTY-FIVE  GAUGE PARS PLANA VITRECTOMY;  Surgeon: Bernarda Caffey, MD;   Location: [facility];  Service: Ophthalmology;  Laterality: Right;   PERFLUORONE INJECTION Right 02/07/2021   Procedure: PERFLUORON INJECTION;  Surgeon: Bernarda Caffey, MD;  Location: [facility];  Service: Ophthalmology;  Laterality: Right;   PHOTOCOAGULATION WITH LASER Right 02/07/2021   Procedure: PHOTOCOAGULATION WITH LASER;  Surgeon: Bernarda Caffey, MD;  Location: [facility];  Service: Ophthalmology;  Laterality: Right;   TYMPANOSTOMY TUBE PLACEMENT Bilateral    child   FAMILY HISTORY Family History  Problem Relation Age of Onset   Diabetes Mother    Vision loss Mother    ADD / ADHD Brother    Allergies Brother    Asthma Brother    Vision loss Brother    Cancer Paternal Grandfather        Colon Cancer   Heart disease Father 23       died from MI at age 43   SOCIAL HISTORY Social History   Tobacco Use   Smoking status: Every Day    Packs/day: 0.25  Years: 6.00    Pack years: 1.50    Types: Cigarettes   Smokeless tobacco: Never   Tobacco comments:    11-16-2020  per pt 3 cig per day  Vaping Use   Vaping Use: Former   Devices: uses different types  Substance Use Topics   Alcohol use: Yes    Comment: socially   Drug use: Not Currently    Types: Marijuana         OPHTHALMIC EXAM:  Base Eye Exam     Visual Acuity (Snellen - Linear)       Right Left   Dist [place] LP Def   Dist ph [place] NI          Tonometry (Tonopen, 8:07 AM)       Right Left   Pressure 13 Def         Pupils       Dark Light Shape React APD   Right 4 4 Round Minimal None   Left 4 3 Round Brisk None         Visual Fields (Counting fingers)       Left Right    Full    Restrictions  Total superior temporal, inferior temporal, superior nasal, inferior nasal deficiencies         Neuro/Psych     Oriented x3: Yes   Mood/Affect: Normal         Dilation     Right eye: 1.0% Mydriacyl, 2.5% Phenylephrine @ 8:07 AM           Slit Lamp and Fundus Exam     External Exam       Right  Left   External Normal Normal         Slit Lamp Exam       Right Left   Lids/Lashes Normal Normal   Conjunctiva/Sclera Minimal Subconjunctival hemorrhage, sutures intact melanosis   Cornea Epithelial defect, 3+ Descemet's folds, 1+ Punctate epithelial erosions trace Punctate epithelial erosions, mild tear film debris   Anterior Chamber deep and clear, narrow temporal angle deep and clear, narrow temporal angle   Iris round and dilated,  no NVI Round and moderateley dilated   Lens 1-2+ cortical, trace Posterior subcapsular cataract 1-2+ cortical   Vitreous post vitrectomy, good silicone oil fill mild syneresis         Fundus Exam       Right Left   Disc Pink and Sharp, mild heme    C/D Ratio 0.2 0.2   Macula flat under oil, fibrosis improved    Vessels severe attenuation, scattered heme, fibrosis improved    Periphery Dense 360 PRP, scattered heme greatest ST             IMAGING AND PROCEDURES  Imaging and Procedures for 02/08/2021          ASSESSMENT/PLAN:    ICD-10-CM   1. Proliferative diabetic retinopathy of both eyes with macular edema associated with type 2 diabetes mellitus ([place])  D97.4163     2. Vitreous hemorrhage, right eye (HCC)  H43.11     3. Retinal edema  H35.81     4. Essential hypertension  I10     5. Hypertensive retinopathy of both eyes  H35.033       1-3. 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 edema and VH; OS without DME - FA (07.01.22) shows +NV OU (OD >> OS); late-leaking MA, vascular nonperfusion -- would benefit from PRP OU - s/p  IVA OD #1 (07.15.22), #2 (09.12.22) - s/p PRP OD (06.17.22) - s/p PRP OS (07.29.22) - s/p POD1 s/p PPV/MP/PFO/EL/FAX/5000cs SO + IVA OD, 09.16.2022             - retinal fibrosis and traction improved under oil             - IOP good at 13 - BCVA OD LP post surgery             - start   PF 4x/day OD                          zymaxid QID OD                          Atropine BID OD                           Brimonidine QD OD                          PSO ung QID OD              - cont face down positioning x3 days; avoid laying flat on back              - eye shield when sleeping              - post op drop and positioning instructions reviewed              - tylenol/ibuprofen for pain              - f/u Thursday, Sept 22, DFE OD  4,5. Hypertensive retinopathy OU - discussed importance of tight BP control - monitor   Ophthalmic Meds Ordered this visit:  No orders of the defined types were placed in this encounter.     Return in about 6 days (around 02/14/2021) for f/u PDR OU, DFE.  There are no Patient Instructions on file for this visit.  This document serves as a record of services personally performed by Gardiner Sleeper, MD, PhD. It was created on their behalf by San Jetty. Owens Shark, OA an ophthalmic technician. The creation of this record is the provider's dictation and/or activities during the visit.    Electronically signed by: San Jetty. Owens Shark, [place] 09.16.2022 4:04 PM  Gardiner Sleeper, M.D., Ph.D. Diseases & Surgery of the Retina and Vitreous [facility]  I have reviewed the above documentation for accuracy and completeness, and I agree with the above. Gardiner Sleeper, M.D., Ph.D. 02/08/21 4:04 PM  Abbreviations: M myopia (nearsighted); A astigmatism; H hyperopia (farsighted); P presbyopia; Mrx spectacle prescription;  CTL contact lenses; OD right eye; OS left eye; OU both eyes  XT exotropia; ET esotropia; PEK punctate epithelial keratitis; PEE punctate epithelial erosions; DES dry eye syndrome; MGD meibomian gland dysfunction; ATs artificial tears; PFAT's preservative free artificial tears; [place] nuclear sclerotic cataract; PSC posterior subcapsular cataract; ERM epi-retinal membrane; PVD posterior vitreous detachment; RD retinal detachment; DM diabetes mellitus; DR diabetic retinopathy; NPDR non-proliferative diabetic retinopathy; PDR proliferative diabetic  retinopathy; CSME clinically significant macular edema; DME diabetic macular edema; dbh dot blot hemorrhages; CWS cotton wool spot; POAG primary open angle glaucoma; C/D cup-to-disc ratio; HVF humphrey visual field; GVF goldmann visual field; OCT optical coherence tomography; IOP intraocular pressure; BRVO Branch retinal vein  occlusion; CRVO central retinal vein occlusion; CRAO central retinal artery occlusion; BRAO branch retinal artery occlusion; RT retinal tear; SB scleral buckle; PPV pars plana vitrectomy; VH Vitreous hemorrhage; PRP panretinal laser photocoagulation; IVK intravitreal kenalog; VMT vitreomacular traction; MH Macular hole;  NVD neovascularization of the disc; NVE neovascularization elsewhere; AREDS age related eye disease study; ARMD age related macular degeneration; POAG primary open angle glaucoma; EBMD epithelial/anterior basement membrane dystrophy; ACIOL anterior chamber intraocular lens; IOL intraocular lens; PCIOL posterior chamber intraocular lens; Phaco/IOL phacoemulsification with intraocular lens placement; [place] photorefractive keratectomy; LASIK laser assisted in situ keratomileusis; HTN hypertension; DM diabetes mellitus; COPD chronic obstructive pulmonary disease

## 2021-02-11 NOTE — Progress Notes (Signed)
[facility] Note  02/14/2021     CHIEF COMPLAINT Patient presents for Retina Follow Up   HISTORY OF PRESENT ILLNESS: Mercedes Valdez is a 25 y.o. female who presents to the clinic today for:   HPI     Retina Follow Up   Patient presents with  Other.  In right eye.  This started 1.  Duration of weeks.  I, the attending physician,  performed the HPI with the patient and updated documentation appropriately.        Comments   Patient here for 1 week retina follow up for s/p PPV + MP OD (02-08-21).  Patient states vision is improving. OD is like looking in the dark. Sometimes has eye pain when has headache.      Last edited by Bernarda Caffey, MD on 02/14/2021  6:29 PM.     Referring physician: Dr. Anthony Sar [address]  [place], [place] [address]  HISTORICAL INFORMATION:   Selected notes from the MEDICAL RECORD NUMBER Referred by Dr. Marin Comment LEE: unsure Ocular Hx- decreased vision due to Diabetes PMH- DM    CURRENT MEDICATIONS: Current Outpatient Medications (Ophthalmic Drugs)  Medication Sig   bacitracin-polymyxin b (POLYSPORIN) ophthalmic ointment Place a 1/2 inch ribbon of ointment into the right lower eyelid at night and as needed during the day.   prednisoLONE acetate (PRED FORTE) 1 % ophthalmic suspension Place 1 drop into the left eye 4 (four) times daily.   No current facility-administered medications for this visit. (Ophthalmic Drugs)   Current Outpatient Medications (Other)  Medication Sig   Accu-Chek Softclix Lancets lancets To check blood sugars 4 times a day. Fasting and 2 hours after breakfast, lunch and dinner.   Blood Pressure Monitoring (BLOOD PRESSURE KIT) DEVI 1 Device by Does not apply route once a week. Please take blood pressure once a week and record in [employer].   glucose blood (ACCU-CHEK GUIDE) test strip To check blood sugars 4 times a day. Fasting, and 2 hours after Breakfast, Lunch and Dinner   ibuprofen (MOTRIN IB) 200  MG tablet Take 3 tablets (600 mg total) by mouth every 6 (six) hours as needed. (Patient not taking: Reported on 01/21/2021)   insulin aspart (NOVOLOG) 100 UNIT/ML FlexPen Inject 10 Units into the skin 3 (three) times daily before meals.   insulin glargine (LANTUS SOLOSTAR) 100 UNIT/ML Solostar Pen Inject 30 Units into the skin daily. (Patient taking differently: Inject 30 Units into the skin at bedtime.)   Insulin Pen Needle (PENTIPS) 32G X 4 MM MISC Use as directed   loratadine (CLARITIN) 10 MG tablet Take 1 tablet (10 mg total) by mouth daily. (Patient not taking: Reported on 01/21/2021)   Misc. Devices (GOJJI WEIGHT SCALE) MISC 1 Device by Does not apply route once a week. Please take weight and record in Babyscripts once a week.   No current facility-administered medications for this visit. (Other)   REVIEW OF SYSTEMS: ROS   Positive for: Endocrine, Eyes Negative for: Constitutional, Gastrointestinal, Neurological, Skin, Genitourinary, Musculoskeletal, HENT, Cardiovascular, Respiratory, Psychiatric, Allergic/Imm, Heme/Lymph Last edited by Theodore Demark, COA on 02/14/2021  9:03 AM.         ALLERGIES Allergies  Allergen Reactions   Amoxicillin Hives   Augmentin [Amoxicillin-Pot Clavulanate] Hives   Penicillins Hives    Has patient had a PCN reaction causing immediate rash, facial/tongue/throat swelling, SOB or lightheadedness with hypotension: No Has patient had a PCN reaction causing severe rash involving mucus  membranes or skin necrosis: No Has patient had a PCN reaction that required hospitalization No Has patient had a PCN reaction occurring within the last 10 years: No If all of the above answers are "NO", then may proceed with Cephalosporin use.   Shellfish Allergy Swelling and Other (See Comments)    Itching lips   Suprax [Cefixime]     Pt unsure reaction, may have been childhood reaction   Latex Rash    PAST MEDICAL HISTORY Past Medical History:  Diagnosis Date    ADHD (attention deficit hyperactivity disorder)    Asthma    Childhood   Diabetic retinopathy ([place])    Elevated blood pressure reading without diagnosis of hypertension    Endometrial mass    History of 2019 novel coronavirus disease (COVID-19) 03/09/2020   positive result in epic,  per pt mild to moderate symptoms that resolved   History of asthma    child   History of gonorrhea 2016   Hypertensive retinopathy    Insulin dependent type 2 diabetes mellitus ([place])    followd by pcp---- (11-16-2020 per pt checks blood sugar at home 4-5 times daily,  fasting sugar--- 70--95)   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 See comment>>a molar pregnancy is not excluded; correlation with beta-HCG is  recommended.  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   Past Surgical History:  Procedure Laterality Date   DILATATION & CURETTAGE/HYSTEROSCOPY WITH MYOSURE N/A 11/21/2020   Procedure: [place];  Surgeon: Aletha Halim, MD;  Location: [facility];  Service: Gynecology;  Laterality: N/A;   INJECTION OF SILICONE OIL Right 9/75/8832   Procedure: INJECTION OF SILICONE OIL;  Surgeon: Bernarda Caffey, MD;  Location: [facility];  Service: Ophthalmology;  Laterality: Right;   KNEE ARTHROSCOPY W/ ACL RECONSTRUCTION Left 2011   MEMBRANE PEEL Right 02/07/2021   Procedure: MEMBRANE PEEL;  Surgeon: Bernarda Caffey, MD;  Location: [facility];  Service: Ophthalmology;  Laterality: Right;   PARS PLANA VITRECTOMY Right 02/07/2021   Procedure: TWENTY-FIVE  GAUGE PARS PLANA VITRECTOMY;  Surgeon: Bernarda Caffey, MD;   Location: [facility];  Service: Ophthalmology;  Laterality: Right;   PERFLUORONE INJECTION Right 02/07/2021   Procedure: PERFLUORON INJECTION;  Surgeon: Bernarda Caffey, MD;  Location: [facility];  Service: Ophthalmology;  Laterality: Right;   PHOTOCOAGULATION WITH LASER Right 02/07/2021   Procedure: PHOTOCOAGULATION WITH LASER;  Surgeon: Bernarda Caffey, MD;  Location: [facility];  Service: Ophthalmology;  Laterality: Right;   TYMPANOSTOMY TUBE PLACEMENT Bilateral    child   FAMILY HISTORY Family History  Problem Relation Age of Onset   Diabetes Mother    Vision loss Mother    ADD / ADHD Brother    Allergies Brother    Asthma Brother    Vision loss Brother    Cancer Paternal Grandfather        Colon Cancer   Heart disease Father 46       died from MI at age 40   SOCIAL HISTORY Social History   Tobacco Use   Smoking status: Every Day    Packs/day: 0.25    Years: 6.00  Pack years: 1.50    Types: Cigarettes   Smokeless tobacco: Never   Tobacco comments:    11-16-2020  per pt 3 cig per day  Vaping Use   Vaping Use: Former   Devices: uses different types  Substance Use Topics   Alcohol use: Yes    Comment: socially   Drug use: Not Currently    Types: Marijuana         OPHTHALMIC EXAM:  Base Eye Exam     Visual Acuity (Snellen - Linear)       Right Left   Dist [place] CF at 3' 20/20   Dist ph [place] NI          Tonometry (Tonopen, 9:00 AM)       Right Left   Pressure 12 def         Visual Fields (Counting fingers)       Left Right   Restrictions  Total superior temporal, inferior temporal, superior nasal, inferior nasal deficiencies         Neuro/Psych     Oriented x3: Yes   Mood/Affect: Normal         Dilation     Right eye: 1.0% Mydriacyl, 2.5% Phenylephrine @ 9:00 AM           Slit Lamp and Fundus Exam     External Exam       Right Left   External Normal Normal         Slit Lamp Exam       Right Left   Lids/Lashes Normal Normal    Conjunctiva/Sclera Minimal Subconjunctival hemorrhage improving, melanosis, sutures intact melanosis   Cornea Epithelial defect closed, trace Punctate epithelial erosions trace Punctate epithelial erosions, mild tear film debris   Anterior Chamber deep and clear, narrow temporal angle deep and clear, narrow temporal angle   Iris round and dilated,  no NVI Round and moderateley dilated   Lens 1-2+ cortical 1-2+ cortical   Vitreous post vitrectomy, good silicone oil fill mild syneresis         Fundus Exam       Right Left   Disc Pink and Sharp, mild heme -- improved    C/D Ratio 0.2 0.2   Macula flat under oil, fibrosis improved    Vessels severe attenuation, scattered heme, fibrosis improved    Periphery Dense 360 PRP, scattered heme greatest ST arcades              IMAGING AND PROCEDURES  Imaging and Procedures for 02/14/2021          ASSESSMENT/PLAN:    ICD-10-CM   1. 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 Vitreous hemorrhage, right eye (HCC)  H43.11     3. Retinal edema  H35.81 CANCELED: OCT, Retina - OU - Both Eyes    4. Essential hypertension  I10     5. Hypertensive retinopathy of both eyes  H35.033       1-3. 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 edema and VH; OS without DME - FA (07.01.22) shows +NV OU (OD >> OS); late-leaking MA, vascular nonperfusion -- would benefit from PRP OU - s/p IVA OD #1 (07.15.22), #2 (09.12.22) - s/p PRP OD (06.17.22) - s/p PRP OS (07.29.22) - s/p POW1 s/p PPV/MP/PFO/EL/FAX/5000cs SO + IVA OD, 09.16.2022 for TRD             -  retinal fibrosis and traction improved under oil             - IOP good at 13 - BCVA CF OD             - continue PF 4x/day OD                               PSO ung qhs and prn during the day OD   - stop Atropine, Brimonidine, finish out Zymaxid (QID OD)             - cont face down positioning 30 min/hr;  avoid laying flat on back, patient is doing 50 mins.               - post op drop and positioning instructions reviewed              - tylenol/ibuprofen for pain              - f/u 2-3 weeks DFE, OCT  4,5. Hypertensive retinopathy OU - discussed importance of tight BP control - monitor   Ophthalmic Meds Ordered this visit:  Meds ordered this encounter  Medications   prednisoLONE acetate (PRED FORTE) 1 % ophthalmic suspension    Sig: Place 1 drop into the left eye 4 (four) times daily.    Dispense:  10 mL    Refill:  1   bacitracin-polymyxin b (POLYSPORIN) ophthalmic ointment    Sig: Place a 1/2 inch ribbon of ointment into the right lower eyelid at night and as needed during the day.    Dispense:  3.5 g    Refill:  2       Return for 2-3 weeks DFE, OCT, POV.  There are no Patient Instructions on file for this visit.  This document serves as a record of services personally performed by Gardiner Sleeper, MD, PhD. It was created on their behalf by San Jetty. Owens Shark, OA an ophthalmic technician. The creation of this record is the provider's dictation and/or activities during the visit.    Electronically signed by: San Jetty. Marguerita Merles 09.19.2022 6:34 PM   Gardiner Sleeper, M.D., Ph.D. Diseases & Surgery of the Retina and Vitreous [facility]  I have reviewed the above documentation for accuracy and completeness, and I agree with the above. Gardiner Sleeper, M.D., Ph.D. 02/14/21 6:36 PM   Abbreviations: M myopia (nearsighted); A astigmatism; H hyperopia (farsighted); P presbyopia; Mrx spectacle prescription;  CTL contact lenses; OD right eye; OS left eye; OU both eyes  XT exotropia; ET esotropia; PEK punctate epithelial keratitis; PEE punctate epithelial erosions; DES dry eye syndrome; MGD meibomian gland dysfunction; ATs artificial tears; PFAT's preservative free artificial tears; [place] nuclear sclerotic cataract; PSC posterior subcapsular cataract; ERM epi-retinal  membrane; PVD posterior vitreous detachment; RD retinal detachment; DM diabetes mellitus; DR diabetic retinopathy; NPDR non-proliferative diabetic retinopathy; PDR proliferative diabetic retinopathy; CSME clinically significant macular edema; DME diabetic macular edema; dbh dot blot hemorrhages; CWS cotton wool spot; POAG primary open angle glaucoma; C/D cup-to-disc ratio; HVF humphrey visual field; GVF goldmann visual field; OCT optical coherence tomography; IOP intraocular pressure; BRVO Branch retinal vein occlusion; CRVO central retinal vein occlusion; CRAO central retinal artery occlusion; BRAO branch retinal artery occlusion; RT retinal tear; SB scleral buckle; PPV pars plana vitrectomy; VH Vitreous hemorrhage; PRP panretinal laser photocoagulation; IVK intravitreal kenalog; VMT vitreomacular traction; MH Macular hole;  NVD  neovascularization of the disc; NVE neovascularization elsewhere; AREDS age related eye disease study; ARMD age related macular degeneration; POAG primary open angle glaucoma; EBMD epithelial/anterior basement membrane dystrophy; ACIOL anterior chamber intraocular lens; IOL intraocular lens; PCIOL posterior chamber intraocular lens; Phaco/IOL phacoemulsification with intraocular lens placement; [place] photorefractive keratectomy; LASIK laser assisted in situ keratomileusis; HTN hypertension; DM diabetes mellitus; COPD chronic obstructive pulmonary disease

## 2021-02-14 ENCOUNTER — Encounter (INDEPENDENT_AMBULATORY_CARE_PROVIDER_SITE_OTHER): Payer: Self-pay | Admitting: Ophthalmology

## 2021-02-14 ENCOUNTER — Ambulatory Visit (INDEPENDENT_AMBULATORY_CARE_PROVIDER_SITE_OTHER): Payer: Medicaid Other | Admitting: Ophthalmology

## 2021-02-14 ENCOUNTER — Other Ambulatory Visit: Payer: Self-pay

## 2021-02-14 DIAGNOSIS — H35033 Hypertensive retinopathy, bilateral: Secondary | ICD-10-CM

## 2021-02-14 DIAGNOSIS — I10 Essential (primary) hypertension: Secondary | ICD-10-CM

## 2021-02-14 DIAGNOSIS — H3581 Retinal edema: Secondary | ICD-10-CM

## 2021-02-14 DIAGNOSIS — E113513 Type 2 diabetes mellitus with proliferative diabetic retinopathy with macular edema, bilateral: Secondary | ICD-10-CM

## 2021-02-14 DIAGNOSIS — H4311 Vitreous hemorrhage, right eye: Secondary | ICD-10-CM

## 2021-02-14 MED ORDER — PREDNISOLONE ACETATE 1 % OP SUSP: 1.0000 [drp] | Freq: Four times a day (QID) | OPHTHALMIC | 1 refills | Status: DC

## 2021-02-14 MED ORDER — BACITRACIN-POLYMYXIN B 500-10000 UNIT/GM OP OINT: TOPICAL_OINTMENT | OPHTHALMIC | 2 refills | Status: DC

## 2021-02-15 ENCOUNTER — Encounter (INDEPENDENT_AMBULATORY_CARE_PROVIDER_SITE_OTHER): Payer: Medicaid Other | Admitting: Ophthalmology

## 2021-02-22 ENCOUNTER — Telehealth: Payer: Self-pay | Admitting: Lactation Services

## 2021-02-22 MED ORDER — ADVOCATE INSULIN PEN NEEDLES 33G X 4 MM MISC: 1.0000 | Freq: Four times a day (QID) | 3 refills | Status: DC

## 2021-02-22 NOTE — Telephone Encounter (Signed)
Received a refill request for Insulin Needles. 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    Called patient and she reports she has not needles and has not been taking her insulin as she has no needles. She reports she has Insulin but no needles. She reports her blood sugars are 300-400. She reports she feels fine. Patient is using Insulin that she received during pregnancy and is not seeing a physician about her diabetes. Insulin needles sent to pharmacy.   Reviewed with patient that she needs to follow up with PCP ASAP. Reviewed with her that per Chart review, she established care with Durene Fruits in May 2022. Advised patient to call PCP to schedule an appointment ASAP.   Advised if not able to get into PCP by next week, she needs to go to Urgent Care today or this weekend or [facility] for care on Monday.   Checked into [facility] website and not able to get an appointment for patient until early December, however since established with PCP, she may be able to get in sooner.   Patient reports she had eye surgery and cannot see right now, she was agreeable that I can send a My Chart message with information to make appt for follow up for Diabetes.

## 2021-02-23 NOTE — Progress Notes (Signed)
Patient ID: ZARINA PE, female    DOB: 1996-02-21  MRN: [id number]  CC: Diabetes Follow-Up  Subjective: Mercedes Valdez is a 25 y.o. female who presents for diabetes follow-up.   Her concerns today include:   DIABETES TYPE 2: Med Adherence:  _0  Yes    _1  No Medication side effects:  _2  Yes    _3  No Home Monitoring?  _4  Yes    _5  No Home glucose results range: 200-220's in the morning fasting  Diet Adherence: sometimes  Exercise: _6  Yes    _7  No   Patient Active Problem List   Diagnosis Date Noted   History of herpes simplex infection 10/08/2020   Psychosocial stressors 09/26/2014   Insulin dependent type 2 diabetes mellitus ([place]) 09/06/2014   Multiple drug allergies 02/27/2014   Musculoskeletal pain 04/08/2013   Unspecified gastritis and gastroduodenitis without mention of hemorrhage 04/08/2013   Type 2 diabetes mellitus ([place]) 04/08/2013   Irregular menses 04/08/2013   Dysfunctional uterine bleeding 11/09/2012   PCOS (polycystic ovarian syndrome) 09/29/2012   Asthma      Current Outpatient Medications on File Prior to Visit  Medication Sig Dispense Refill   bacitracin-polymyxin b (POLYSPORIN) ophthalmic ointment Place a 1/2 inch ribbon of ointment into the right lower eyelid at night and as needed during the day. 3.5 g 2   Blood Pressure Monitoring (BLOOD PRESSURE KIT) DEVI 1 Device by Does not apply route once a week. Please take blood pressure once a week and record in [employer]. 1 each 0   Misc. Devices (GOJJI WEIGHT SCALE) MISC 1 Device by Does not apply route once a week. Please take weight and record in [employer] once a week. 1 each 0   prednisoLONE acetate (PRED FORTE) 1 % ophthalmic suspension Place 1 drop into the left eye 4 (four) times daily. 10 mL 1   [DISCONTINUED] atorvastatin (LIPITOR) 20 MG tablet Take 1 tablet (20 mg total) by mouth daily. To lower cholesterol (Patient not taking: Reported on 08/29/2019) 90 tablet 1   No current  facility-administered medications on file prior to visit.    Allergies  Allergen Reactions   Amoxicillin Hives   Augmentin [Amoxicillin-Pot Clavulanate] Hives   Penicillins Hives    Has patient had a PCN reaction causing immediate rash, facial/tongue/throat swelling, SOB or lightheadedness with hypotension: No Has patient had a PCN reaction causing severe rash involving mucus membranes or skin necrosis: No Has patient had a PCN reaction that required hospitalization No Has patient had a PCN reaction occurring within the last 10 years: No If all of the above answers are "NO", then may proceed with Cephalosporin use.   Shellfish Allergy Swelling and Other (See Comments)    Itching lips   Suprax [Cefixime]     Pt unsure reaction, may have been childhood reaction   Latex Rash    Social History   Socioeconomic History   Marital status: Single    Spouse name: Not on file   Number of children: Not on file   Years of education: 11   Highest education level: Not on file  Occupational History   Occupation: Biochemist, clinical    Comment: RRD  Tobacco Use   Smoking status: Every Day    Packs/day: 0.25    Years: 6.00    Pack years: 1.50    Types: Cigarettes   Smokeless tobacco: Never   Tobacco comments:    11-16-2020  per pt 3 cig per day  Vaping Use  Vaping Use: Former   Devices: uses different types  Substance and Sexual Activity   Alcohol use: Yes    Comment: socially   Drug use: Not Currently    Types: Marijuana   Sexual activity: Yes    Birth control/protection: None, Injection  Other Topics Concern   Not on file  Social History Narrative   Not on file   Social Determinants of Health   Financial Resource Strain: Not on file  Food Insecurity: No Food Insecurity   Worried About Charity fundraiser in the Last Year: Never true   [place] in the Last Year: Never true  Transportation Needs: No Transportation Needs   Lack of Transportation (Medical): No   Lack  of Transportation (Non-Medical): No  Physical Activity: Not on file  Stress: Not on file  Social Connections: Not on file  Intimate Partner Violence: Not on file    Family History  Problem Relation Age of Onset   Diabetes Mother    Vision loss Mother    ADD / ADHD Brother    Allergies Brother    Asthma Brother    Vision loss Brother    Cancer Paternal Grandfather        Colon Cancer   Heart disease Father 15       died from MI at age 77    Past Surgical History:  Procedure Laterality Date   DILATATION & CURETTAGE/HYSTEROSCOPY WITH MYOSURE N/A 11/21/2020   Procedure: [place];  Surgeon: Aletha Halim, MD;  Location: [place];  Service: Gynecology;  Laterality: N/A;   INJECTION OF SILICONE OIL Right 9/32/6712   Procedure: INJECTION OF SILICONE OIL;  Surgeon: Bernarda Caffey, MD;  Location: [facility];  Service: Ophthalmology;  Laterality: Right;   KNEE ARTHROSCOPY W/ ACL RECONSTRUCTION Left 2011   MEMBRANE PEEL Right 02/07/2021   Procedure: MEMBRANE PEEL;  Surgeon: Bernarda Caffey, MD;  Location: [facility];  Service: Ophthalmology;  Laterality: Right;   PARS PLANA VITRECTOMY Right 02/07/2021   Procedure: TWENTY-FIVE  GAUGE PARS PLANA VITRECTOMY;  Surgeon: Bernarda Caffey, MD;  Location: [facility];  Service: Ophthalmology;  Laterality: Right;   PERFLUORONE INJECTION Right 02/07/2021   Procedure: PERFLUORON INJECTION;  Surgeon: Bernarda Caffey, MD;  Location: [facility];  Service: Ophthalmology;  Laterality: Right;   PHOTOCOAGULATION WITH LASER Right 02/07/2021   Procedure: PHOTOCOAGULATION WITH LASER;  Surgeon: Bernarda Caffey, MD;  Location: [facility];  Service: Ophthalmology;  Laterality: Right;   TYMPANOSTOMY TUBE PLACEMENT Bilateral    child    ROS: Review of Systems Negative except as stated above  PHYSICAL EXAM: BP 134/89 (BP Location: Left Arm, Patient Position: Sitting, Cuff Size: Large)   Pulse 100   Temp 98.3 F (36.8 C)   Resp 16   Ht  _0  (1.651 m)   Wt 215 lb 9.6 oz (97.8 kg)   LMP 02/07/2021 (Exact Date)   SpO2 99%   BMI 35.88 kg/m   Physical Exam HENT:     Head: Normocephalic and atraumatic.  Eyes:     Extraocular Movements: Extraocular movements intact.     Conjunctiva/sclera: Conjunctivae normal.     Pupils: Pupils are equal, round, and reactive to light.  Cardiovascular:     Rate and Rhythm: Normal rate and regular rhythm.     Pulses: Normal pulses.     Heart sounds: Normal heart sounds.  Pulmonary:     Effort: Pulmonary effort is normal.  Breath sounds: Normal breath sounds.  Musculoskeletal:     Cervical back: Normal range of motion and neck supple.  Neurological:     General: No focal deficit present.     Mental Status: She is alert and oriented to person, place, and time.  Psychiatric:        Mood and Affect: Mood normal.        Behavior: Behavior normal.   ASSESSMENT AND PLAN: 1. Type 2 diabetes mellitus without complication, with long-term current use of insulin ([place]): - Point-of-care hemoglobin A1c > 15%. This is increased from previous of 14.0% on 01/16/2021. Sending out hemoglobin A1c from lab draw for further evaluation.  - Patient endorses dietary indiscretion.  - Begin Glimepiride as prescribed.  - Increase Lantus from 30 units daily to 35 units daily.  - Continue Insulin Aspart as prescribed.  - Discussed the importance of healthy eating habits, low-carbohydrate diet, low-sugar diet, regular aerobic exercise (at least 150 minutes a week as tolerated) and medication compliance to achieve or maintain control of diabetes. - To achieve an A1C goal of less than or equal to 7.0 percent, a fasting blood sugar of 80 to 130 mg/dL and a postprandial glucose (90 to 120 minutes after a meal) less than 180 mg/dL. In the event of sugars less than 60 mg/dl or greater than 400 mg/dl please notify the clinic ASAP. It is recommended that you undergo annual eye exams and annual foot exams. - Follow-up  with clinical pharmacist Lurena Joiner in 4 weeks located at [facility] Write your home blood sugar results down each day and bring those results to your appointment along with your home glucose monitor. Medications may be revised at that time if needed. - Referral to Endocrinology for further evaluation and management.  - Follow-up with primary provider as scheduled.  - Hemoglobin A1c - Ambulatory referral to Endocrinology - glimepiride (AMARYL) 1 MG tablet; Take 1 tablet (1 mg total) by mouth daily with breakfast.  Dispense: 60 tablet; Refill: 0 - insulin glargine (LANTUS SOLOSTAR) 100 UNIT/ML Solostar Pen; Inject 35 Units into the skin daily.  Dispense: 31.5 mL; Refill: 0 - insulin aspart (NOVOLOG) 100 UNIT/ML FlexPen; Inject 10 Units into the skin 3 (three) times daily before meals.  Dispense: 9 mL; Refill: 1 - Insulin Pen Needle (ADVOCATE INSULIN PEN NEEDLES) 33G X 4 MM MISC; 1 Device by Does not apply route in the morning, at noon, in the evening, and at bedtime.  Dispense: 100 each; Refill: 3 - Accu-Chek Softclix Lancets lancets; To check blood sugars 4 times a day. Fasting and 2 hours after breakfast, lunch and dinner.  Dispense: 100 each; Refill: 8 - glucose blood (ACCU-CHEK GUIDE) test strip; To check blood sugars 4 times a day. Fasting, and 2 hours after Breakfast, Lunch and Dinner  Dispense: 100 each; Refill: 8  2. Recurrent infection of skin: - Referral to Dermatology for further evaluation and management.  - Ambulatory referral to Dermatology   Patient was given the opportunity to ask questions.  Patient verbalized understanding of the plan and was able to repeat key elements of the plan. Patient was given clear instructions to go to Emergency Department or return to medical center if symptoms don't improve, worsen, or new problems develop.The patient verbalized understanding.   Orders Placed This Encounter  Procedures   Hemoglobin A1c   Ambulatory  referral to Dermatology   Ambulatory referral to Endocrinology     Requested Prescriptions   Signed  Prescriptions Disp Refills   glimepiride (AMARYL) 1 MG tablet 60 tablet 0    Sig: Take 1 tablet (1 mg total) by mouth daily with breakfast.   insulin glargine (LANTUS SOLOSTAR) 100 UNIT/ML Solostar Pen 31.5 mL 0    Sig: Inject 35 Units into the skin daily.   insulin aspart (NOVOLOG) 100 UNIT/ML FlexPen 9 mL 1    Sig: Inject 10 Units into the skin 3 (three) times daily before meals.   Insulin Pen Needle (ADVOCATE INSULIN PEN NEEDLES) 33G X 4 MM MISC 100 each 3    Sig: 1 Device by Does not apply route in the morning, at noon, in the evening, and at bedtime.   Accu-Chek Softclix Lancets lancets 100 each 8    Sig: To check blood sugars 4 times a day. Fasting and 2 hours after breakfast, lunch and dinner.   glucose blood (ACCU-CHEK GUIDE) test strip 100 each 8    Sig: To check blood sugars 4 times a day. Fasting, and 2 hours after Breakfast, Lunch and Dinner    Return in about 4 weeks (around 03/25/2021) for Follow-Up or next available [place] [facility] .  Camillia Herter, NP

## 2021-02-25 ENCOUNTER — Ambulatory Visit (INDEPENDENT_AMBULATORY_CARE_PROVIDER_SITE_OTHER): Payer: Medicaid Other | Admitting: Family

## 2021-02-25 ENCOUNTER — Other Ambulatory Visit: Payer: Self-pay

## 2021-02-25 ENCOUNTER — Encounter (HOSPITAL_COMMUNITY): Payer: Self-pay | Admitting: Emergency Medicine

## 2021-02-25 ENCOUNTER — Encounter: Payer: Self-pay | Admitting: Family

## 2021-02-25 ENCOUNTER — Ambulatory Visit (HOSPITAL_COMMUNITY)
Admission: EM | Admit: 2021-02-25 | Discharge: 2021-02-25 | Disposition: A | Discharge status: Home / Self Care | Payer: Medicaid Other | Attending: Physician Assistant | Admitting: Physician Assistant

## 2021-02-25 VITALS — BP 134/89 | HR 100 | Temp 98.3°F | Resp 16 | Ht 65.0 in | Wt 215.6 lb

## 2021-02-25 DIAGNOSIS — L089 Local infection of the skin and subcutaneous tissue, unspecified: Secondary | ICD-10-CM | POA: Diagnosis not present

## 2021-02-25 DIAGNOSIS — Z794 Long term (current) use of insulin: Secondary | ICD-10-CM

## 2021-02-25 DIAGNOSIS — E119 Type 2 diabetes mellitus without complications: Secondary | ICD-10-CM | POA: Diagnosis not present

## 2021-02-25 DIAGNOSIS — Z23 Encounter for immunization: Secondary | ICD-10-CM | POA: Diagnosis not present

## 2021-02-25 DIAGNOSIS — L02413 Cutaneous abscess of right upper limb: Secondary | ICD-10-CM | POA: Diagnosis not present

## 2021-02-25 DIAGNOSIS — L02411 Cutaneous abscess of right axilla: Secondary | ICD-10-CM

## 2021-02-25 MED ORDER — LANTUS SOLOSTAR 100 UNIT/ML ~~LOC~~ SOPN: 35.0000 [IU] | PEN_INJECTOR | Freq: Every day | SUBCUTANEOUS | 0 refills | Status: DC

## 2021-02-25 MED ORDER — GLIMEPIRIDE 1 MG PO TABS: 1.0000 mg | ORAL_TABLET | Freq: Every day | ORAL | 0 refills | Status: DC

## 2021-02-25 MED ORDER — ACCU-CHEK GUIDE VI STRP: ORAL_STRIP | 8 refills | Status: AC

## 2021-02-25 MED ORDER — ADVOCATE INSULIN PEN NEEDLES 33G X 4 MM MISC: 1.0000 | Freq: Four times a day (QID) | 3 refills | Status: DC

## 2021-02-25 MED ORDER — TETANUS-DIPHTH-ACELL PERTUSSIS 5-2.5-18.5 LF-MCG/0.5 IM SUSY
PREFILLED_SYRINGE | INTRAMUSCULAR | Status: AC
  Filled 2021-02-25: qty 0.5

## 2021-02-25 MED ORDER — ACCU-CHEK SOFTCLIX LANCETS MISC
8 refills | Status: AC
Start: 2021-02-25 — End: ?

## 2021-02-25 MED ORDER — DOXYCYCLINE HYCLATE 100 MG PO CAPS: 100.0000 mg | ORAL_CAPSULE | Freq: Two times a day (BID) | ORAL | 0 refills | Status: DC

## 2021-02-25 MED ORDER — INSULIN ASPART 100 UNIT/ML FLEXPEN: 10.0000 [IU] | PEN_INJECTOR | Freq: Three times a day (TID) | SUBCUTANEOUS | 1 refills | Status: DC

## 2021-02-25 MED ORDER — TETANUS-DIPHTH-ACELL PERTUSSIS 5-2.5-18.5 LF-MCG/0.5 IM SUSY
0.5000 mL | PREFILLED_SYRINGE | Freq: Once | INTRAMUSCULAR | Status: AC
  Administered 2021-02-25: 0.5 mL via INTRAMUSCULAR

## 2021-02-25 NOTE — Progress Notes (Signed)
Pt presents for diabetes follow-up  

## 2021-02-25 NOTE — Discharge Instructions (Signed)
We drained 1 abscess today.  You still have several areas that were not ready to be drained.  Please continue putting warm compresses and keep this area clean.  We are starting you on antibiotics (doxycycline 100 mg for 10 days).  Please avoid being in the sun while you are on this medication.  If you have any signs of infection including fever, increased drainage, pain, nausea, vomiting you need to be reevaluated.  I do recommend following up with your primary care provider to consider referral to dermatologist as we discussed.

## 2021-02-25 NOTE — Patient Instructions (Signed)
Skin Abscess  A skin abscess is an infected area on or under your skin that contains a collection of pus and other material. An abscess may also be called a furuncle,carbuncle, or boil. An abscess can occur in or on almost any part of your body. Some abscesses break open (rupture) on their own. Most continue to get worse unless they are treated. The infection can spread deeper into the body and eventually into your blood, whichcan make you feel ill. Treatment usually involves draining the abscess. What are the causes? An abscess occurs when germs, like bacteria, pass through your skin and cause an infection. This may be caused by: A scrape or cut on your skin. A puncture wound through your skin, including a needle injection or insect bite. Blocked oil or sweat glands. Blocked and infected hair follicles. A cyst that forms beneath your skin (sebaceous cyst) and becomes infected. What increases the risk? This condition is more likely to develop in people who: Have a weak body defense system (immune system). Have diabetes. Have dry and irritated skin. Get frequent injections or use illegal IV drugs. Have a foreign body in a wound, such as a splinter. Have problems with their lymph system or veins. What are the signs or symptoms? Symptoms of this condition include: A painful, firm bump under the skin. A bump with pus at the top. This may break through the skin and drain. Other symptoms include: Redness surrounding the abscess site. Warmth. Swelling of the lymph nodes (glands) near the abscess. Tenderness. A sore on the skin. How is this diagnosed? This condition may be diagnosed based on: A physical exam. Your medical history. A sample of pus. This may be used to find out what is causing the infection. Blood tests. Imaging tests, such as an ultrasound, CT scan, or MRI. How is this treated? A small abscess that drains on its own may not need treatment. Treatment for larger abscesses  may include: Moist heat or heat pack applied to the area several times a day. A procedure to drain the abscess (incision and drainage). Antibiotic medicines. For a severe abscess, you may first get antibiotics through an IV and then change to antibiotics by mouth. Follow these instructions at home: Medicines  Take over-the-counter and prescription medicines only as told by your health care provider. If you were prescribed an antibiotic medicine, take it as told by your health care provider. Do not stop taking the antibiotic even if you start to feel better.  Abscess care  If you have an abscess that has not drained, apply heat to the affected area. Use the heat source that your health care provider recommends, such as a moist heat pack or a heating pad. Place a towel between your skin and the heat source. Leave the heat on for 20-30 minutes. Remove the heat if your skin turns bright red. This is especially important if you are unable to feel pain, heat, or cold. You may have a greater risk of getting burned. Follow instructions from your health care provider about how to take care of your abscess. Make sure you: Cover the abscess with a bandage (dressing). Change your dressing or gauze as told by your health care provider. Wash your hands with soap and water before you change the dressing or gauze. If soap and water are not available, use hand sanitizer. Check your abscess every day for signs of a worsening infection. Check for: More redness, swelling, or pain. More fluid or blood. Warmth. More   pus or a bad smell.  General instructions To avoid spreading the infection: Do not share personal care items, towels, or hot tubs with others. Avoid making skin contact with other people. Keep all follow-up visits as told by your health care provider. This is important. Contact a health care provider if you have: More redness, swelling, or pain around your abscess. More fluid or blood coming  from your abscess. Warm skin around your abscess. More pus or a bad smell coming from your abscess. A fever. Muscle aches. Chills or a general ill feeling. Get help right away if you: Have severe pain. See red streaks on your skin spreading away from the abscess. Summary A skin abscess is an infected area on or under your skin that contains a collection of pus and other material. A small abscess that drains on its own may not need treatment. Treatment for larger abscesses may include having a procedure to drain the abscess and taking an antibiotic. This information is not intended to replace advice given to you by your health care provider. Make sure you discuss any questions you have with your healthcare provider. Document Revised: 09/02/2018 Document Reviewed: 06/25/2017 Elsevier Patient Education  2022 [employer]  

## 2021-02-25 NOTE — ED Provider Notes (Signed)
[facility]    CSN: [id number] Arrival date & time: 02/25/21  0931      History   Chief Complaint Chief Complaint  Patient presents with   Abscess    HPI Mercedes Valdez is a 25 y.o. female.   Patient presents today with a several week history of enlarging nodules in bilateral axilla but worse on the right.  She has a history of recurrent skin infections and recurrent abscesses.  She has not seen a dermatologist and denies formal diagnosis of hidradenitis suppurativa.  She denies any fever, nausea, vomiting.  Reports pain is rated 1/2 on a 0-10 pain scale, localized to right axilla, described as pressure/aching, worse with palpation.  She has not been taking any over-the-counter medications for symptom management.  Denies any recent antibiotic use.  She does have a history of diabetes reports blood sugars are not at goal.  She is confident that she is not pregnant.   Past Medical History:  Diagnosis Date   ADHD (attention deficit hyperactivity disorder)    Asthma    Childhood   Diabetic retinopathy ([place])    Elevated blood pressure reading without diagnosis of hypertension    Endometrial mass    History of 2019 novel coronavirus disease (COVID-19) 03/09/2020   positive result in epic,  per pt mild to moderate symptoms that resolved   History of asthma    child   History of gonorrhea 2016   Hypertensive retinopathy    Insulin dependent type 2 diabetes mellitus ([place])    followd by pcp---- (11-16-2020 per pt checks blood sugar at home 4-5 times daily,  fasting sugar--- 70--95)   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 See comment>>a molar pregnancy is not excluded; correlation with beta-HCG is  recommended.  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    Patient Active Problem List   Diagnosis Date Noted   History of herpes simplex infection 10/08/2020   Psychosocial stressors 09/26/2014   Insulin dependent type 2 diabetes mellitus ([place]) 09/06/2014   Multiple drug allergies 02/27/2014   Musculoskeletal pain 04/08/2013   Unspecified gastritis and gastroduodenitis without mention of hemorrhage 04/08/2013   Type 2 diabetes mellitus ([place]) 04/08/2013   Irregular menses 04/08/2013   Dysfunctional uterine bleeding 11/09/2012   PCOS (polycystic ovarian syndrome) 09/29/2012   Asthma     Past Surgical History:  Procedure Laterality Date   DILATATION & CURETTAGE/HYSTEROSCOPY WITH MYOSURE N/A 11/21/2020   Procedure: [place];  Surgeon: Aletha Halim, MD;  Location: [facility];  Service: Gynecology;  Laterality: N/A;   INJECTION OF SILICONE OIL Right 5/46/5035   Procedure: INJECTION OF SILICONE OIL;  Surgeon: Bernarda Caffey, MD;  Location: [facility];  Service: Ophthalmology;  Laterality: Right;   KNEE ARTHROSCOPY W/ ACL RECONSTRUCTION Left 2011   MEMBRANE PEEL Right 02/07/2021   Procedure: MEMBRANE PEEL;  Surgeon: Bernarda Caffey, MD;  Location: [facility];  Service: Ophthalmology;  Laterality: Right;   PARS PLANA VITRECTOMY Right 02/07/2021   Procedure: TWENTY-FIVE  GAUGE PARS PLANA VITRECTOMY;  Surgeon: Bernarda Caffey, MD;  Location: [facility];  Service: Ophthalmology;  Laterality:  Right;   PERFLUORONE INJECTION Right 02/07/2021   Procedure: PERFLUORON INJECTION;  Surgeon: Bernarda Caffey, MD;  Location: [facility];  Service: Ophthalmology;  Laterality: Right;   PHOTOCOAGULATION WITH LASER Right 02/07/2021   Procedure: PHOTOCOAGULATION WITH LASER;  Surgeon: Bernarda Caffey, MD;  Location: [facility];  Service: Ophthalmology;  Laterality: Right;   TYMPANOSTOMY TUBE  PLACEMENT Bilateral    child    OB History     Gravida  1   Para      Term      Preterm      AB  1   Living         SAB  0   IAB      Ectopic      Multiple      Live Births           Obstetric Comments  G1: possible  molar pregnancy at 5wks based on path, SAB          Home Medications    Prior to Admission medications   Medication Sig Start Date End Date Taking? Authorizing Provider  doxycycline (VIBRAMYCIN) 100 MG capsule Take 1 capsule (100 mg total) by mouth 2 (two) times daily. 02/25/21  Yes Talah Cookston K, PA-C  Accu-Chek Softclix Lancets lancets To check blood sugars 4 times a day. Fasting and 2 hours after breakfast, lunch and dinner. 10/03/20   Aletha Halim, MD  bacitracin-polymyxin b (POLYSPORIN) ophthalmic ointment Place a 1/2 inch ribbon of ointment into the right lower eyelid at night and as needed during the day. 02/14/21   Bernarda Caffey, MD  Blood Pressure Monitoring (BLOOD PRESSURE KIT) DEVI 1 Device by Does not apply route once a week. Please take blood pressure once a week and record in [employer]. 10/08/20   Clarnce Flock, MD  glucose blood (ACCU-CHEK GUIDE) test strip To check blood sugars 4 times a day. Fasting, and 2 hours after Breakfast, Lunch and Dinner 10/03/20   Aletha Halim, MD  ibuprofen (MOTRIN IB) 200 MG tablet Take 3 tablets (600 mg total) by mouth every 6 (six) hours as needed. Patient not taking: Reported on 01/21/2021 11/21/20   Aletha Halim, MD  insulin aspart (NOVOLOG) 100 UNIT/ML FlexPen Inject 10 Units into the skin 3 (three) times daily before meals. 10/18/20   Chancy Milroy, MD  insulin glargine (LANTUS SOLOSTAR) 100 UNIT/ML Solostar Pen Inject 30 Units into the skin daily. Patient taking differently: Inject 30 Units into the skin at bedtime. 10/18/20   Chancy Milroy, MD  Insulin Pen Needle (ADVOCATE INSULIN PEN NEEDLES) 33G X 4 MM MISC 1 Device by Does not apply route in the morning, at noon, in the evening,  and at bedtime. 02/22/21   Donnamae Jude, MD  Insulin Pen Needle (PENTIPS) 32G X 4 MM MISC Use as directed 09/19/20   Gavin Pound, CNM  loratadine (CLARITIN) 10 MG tablet Take 1 tablet (10 mg total) by mouth daily. Patient not taking: Reported on 01/21/2021 09/19/20   Gavin Pound, CNM  Misc. Devices (GOJJI WEIGHT SCALE) MISC 1 Device by Does not apply route once a week. Please take weight and record in [employer] once a week. 10/08/20   Clarnce Flock, MD  prednisoLONE acetate (PRED FORTE) 1 % ophthalmic suspension Place 1 drop into the left eye 4 (four) times daily. 02/14/21   Bernarda Caffey, MD  atorvastatin (LIPITOR) 20 MG tablet Take 1 tablet (20 mg total) by mouth daily. To lower cholesterol Patient not taking:  Reported on 08/29/2019 12/13/18 08/29/19  Antony Blackbird, MD    Family History Family History  Problem Relation Age of Onset   Diabetes Mother    Vision loss Mother    ADD / ADHD Brother    Allergies Brother    Asthma Brother    Vision loss Brother    Cancer Paternal Grandfather        Colon Cancer   Heart disease Father 75       died from MI at age 51    Social History Social History   Tobacco Use   Smoking status: Every Day    Packs/day: 0.25    Years: 6.00    Pack years: 1.50    Types: Cigarettes   Smokeless tobacco: Never   Tobacco comments:    11-16-2020  per pt 3 cig per day  Vaping Use   Vaping Use: Former   Devices: uses different types  Substance Use Topics   Alcohol use: Yes    Comment: socially   Drug use: Not Currently    Types: Marijuana     Allergies   Amoxicillin, Augmentin [amoxicillin-pot clavulanate], Penicillins, Shellfish allergy, Suprax [cefixime], and Latex   Review of Systems Review of Systems  Constitutional:  Positive for activity change. Negative for appetite change, fatigue and fever.  Respiratory:  Negative for cough and shortness of breath.   Cardiovascular:  Negative for chest pain.  Gastrointestinal:  Negative for  abdominal pain, diarrhea, nausea and vomiting.  Skin:  Positive for color change. Negative for wound.  Neurological:  Negative for dizziness, light-headedness and headaches.    Physical Exam Triage Vital Signs ED Triage Vitals  Enc Vitals Group     BP 02/25/21 1008 123/87     Pulse Rate 02/25/21 1008 87     Resp 02/25/21 1008 14     Temp 02/25/21 1008 98.2 F (36.8 C)     Temp Source 02/25/21 1008 Oral     SpO2 02/25/21 1008 98 %     Weight --      Height --      Head Circumference --      Peak Flow --      Pain Score 02/25/21 1007 0     Pain Loc --      Pain Edu? --      Excl. in [place]? --    No data found.  Updated Vital Signs BP 123/87 (BP Location: Right Arm)   Pulse 87   Temp 98.2 F (36.8 C) (Oral)   Resp 14   LMP 02/07/2021 (Exact Date)   SpO2 98%   Visual Acuity Right Eye Distance:   Left Eye Distance:   Bilateral Distance:    Right Eye Near:   Left Eye Near:    Bilateral Near:     Physical Exam Vitals reviewed.  Constitutional:      General: She is awake. She is not in acute distress.    Appearance: Normal appearance. She is well-developed. She is not ill-appearing.     Comments: Very pleasant female appears stated age in no acute distress sitting comfortably in exam room  HENT:     Head: Normocephalic and atraumatic.  Cardiovascular:     Rate and Rhythm: Normal rate and regular rhythm.     Heart sounds: Normal heart sounds, S1 normal and S2 normal. No murmur heard. Pulmonary:     Effort: Pulmonary effort is normal.     Breath sounds: Normal breath sounds. No wheezing,  rhonchi or rales.     Comments: Clear to auscultation bilaterally Skin:    Findings: Abscess and rash present. Rash is nodular.     Comments: Multiple nodules ranging in size from half a centimeter to 1 cm with induration without fluctuance noted left axilla.  Multiple larger nodules ranging in size from 2cm to 4 cm with central fluctuance of superior nodule noted in right axilla.   Additional nodules have significant induration without fluctuance.  No streaking or evidence of lymphangitis.  No bleeding or drainage noted.  Psychiatric:        Behavior: Behavior is cooperative.     UC Treatments / Results  Labs (all labs ordered are listed, but only abnormal results are displayed) Labs Reviewed - No data to display  EKG   Radiology No results found.  Procedures Incision and Drainage  Date/Time: 02/25/2021 10:42 AM Performed by: Terrilee Croak, PA-C Authorized by: Terrilee Croak, PA-C   Consent:    Consent obtained:  Verbal   Consent given by:  Patient   Risks, benefits, and alternatives were discussed: yes     Risks discussed:  Bleeding, incomplete drainage and infection   Alternatives discussed:  Delayed treatment, alternative treatment and referral Universal protocol:    Procedure explained and questions answered to patient or proxy's satisfaction: yes     Patient identity confirmed:  Verbally with patient Location:    Type:  Abscess   Size:  4 cm x 2 cm   Location:  Upper extremity   Upper extremity location:  Shoulder   Shoulder location:  R shoulder Pre-procedure details:    Skin preparation:  Chlorhexidine Sedation:    Sedation type:  None Anesthesia:    Anesthesia method:  Local infiltration   Local anesthetic:  Lidocaine 1% WITH epi Procedure type:    Complexity:  Simple Procedure details:    Ultrasound guidance: no     Needle aspiration: no     Incision types:  Single straight   Incision depth:  Dermal   Wound management:  Probed and deloculated and irrigated with saline   Drainage:  Bloody and purulent   Drainage amount:  Moderate   Wound treatment:  Wound left open   Packing materials:  None Post-procedure details:    Procedure completion:  Tolerated well, no immediate complications (including critical care time)  Medications Ordered in UC Medications  Tdap (BOOSTRIX) injection 0.5 mL (has no administration in time range)     Initial Impression / Assessment and Plan / UC Course  I have reviewed the triage vital signs and the nursing notes.  Pertinent labs & imaging results that were available during my care of the patient were reviewed by me and considered in my medical decision making (see chart for details).     Only 1 lesion with obvious fluctuance amenable to I&D.  This lesion was I&D in clinic (see procedure note above).  Patient was encouraged to use warm compresses and conservative treatment options for additional lesions.  She was started on doxycycline 100 mg twice daily for 10 days; is confident she is not pregnant.  Recommend that she keep area clean and use Tylenol ibuprofen for pain relief.  Discussed symptoms are consistent with hidradenitis suppurativa and will likely continue to recur.  Recommend that she follow-up with her primary care provider to consider referral to dermatology for ongoing management.  Discussed alarm symptoms that warrant emergent evaluation.  Strict return precautions given to which she expressed understanding.  Final Clinical Impressions(s) / UC Diagnoses   Final diagnoses:  Abscess of axilla, right     Discharge Instructions      We drained 1 abscess today.  You still have several areas that were not ready to be drained.  Please continue putting warm compresses and keep this area clean.  We are starting you on antibiotics (doxycycline 100 mg for 10 days).  Please avoid being in the sun while you are on this medication.  If you have any signs of infection including fever, increased drainage, pain, nausea, vomiting you need to be reevaluated.  I do recommend following up with your primary care provider to consider referral to dermatologist as we discussed.     ED Prescriptions     Medication Sig Dispense Auth. Provider   doxycycline (VIBRAMYCIN) 100 MG capsule Take 1 capsule (100 mg total) by mouth 2 (two) times daily. 20 capsule Alvetta Hidrogo, Derry Skill, PA-C      PDMP not  reviewed this encounter.   Terrilee Croak, PA-C 02/25/21 1044

## 2021-02-25 NOTE — ED Triage Notes (Signed)
C/o multiple abscess in each axilla that have been ongoing. Denies drainage.

## 2021-02-26 LAB — HEMOGLOBIN A1C
Est. average glucose Bld gHb Est-mCnc: 378 mg/dL
Hgb A1c MFr Bld: 14.8 % — ABNORMAL HIGH (ref 4.8–5.6)

## 2021-02-26 NOTE — Progress Notes (Signed)
Patient encouraged to keep appointment with Endocrinology.

## 2021-03-04 NOTE — Progress Notes (Signed)
[facility] Note  03/07/2021   CHIEF COMPLAINT Patient presents for Post-op Follow-up   HISTORY OF PRESENT ILLNESS: Mercedes Valdez is a 25 y.o. female who presents to the clinic today for:   HPI     Post-op Follow-up   In right eye.  Discomfort includes none.  Negative for pain, itching, foreign body sensation, tearing, discharge and floaters.  Vision is improved.  I, the attending physician,  performed the HPI with the patient and updated documentation appropriately.        Comments   Pt states vision is getting better, she can see colors and shapes, no fol or floaters, no eye pain today, still using PF OD QID      Last edited by Bernarda Caffey, MD on 03/07/2021 10:46 PM.    Pt reports subjective improvement in vision OD  Referring physician: Dr. Anthony Sar [address]  [place], [place] [address]  HISTORICAL INFORMATION:   Selected notes from the MEDICAL RECORD NUMBER Referred by Dr. Marin Comment LEE: unsure Ocular Hx- decreased vision due to Diabetes PMH- DM    CURRENT MEDICATIONS: Current Outpatient Medications (Ophthalmic Drugs)  Medication Sig   bacitracin-polymyxin b (POLYSPORIN) ophthalmic ointment Place a 1/2 inch ribbon of ointment into the right lower eyelid at night and as needed during the day.   prednisoLONE acetate (PRED FORTE) 1 % ophthalmic suspension Place 1 drop into the left eye 4 (four) times daily.   No current facility-administered medications for this visit. (Ophthalmic Drugs)   Current Outpatient Medications (Other)  Medication Sig   Accu-Chek Softclix Lancets lancets To check blood sugars 4 times a day. Fasting and 2 hours after breakfast, lunch and dinner.   Blood Pressure Monitoring (BLOOD PRESSURE KIT) DEVI 1 Device by Does not apply route once a week. Please take blood pressure once a week and record in [employer].   doxycycline (VIBRAMYCIN) 100 MG capsule Take 1 capsule (100 mg total) by mouth 2 (two) times daily.    glimepiride (AMARYL) 1 MG tablet Take 1 tablet (1 mg total) by mouth daily with breakfast.   glucose blood (ACCU-CHEK GUIDE) test strip To check blood sugars 4 times a day. Fasting, and 2 hours after Breakfast, Lunch and Dinner   insulin aspart (NOVOLOG) 100 UNIT/ML FlexPen Inject 10 Units into the skin 3 (three) times daily before meals.   insulin glargine (LANTUS SOLOSTAR) 100 UNIT/ML Solostar Pen Inject 35 Units into the skin daily.   Insulin Pen Needle (ADVOCATE INSULIN PEN NEEDLES) 33G X 4 MM MISC 1 Device by Does not apply route in the morning, at noon, in the evening, and at bedtime.   Misc. Devices (GOJJI WEIGHT SCALE) MISC 1 Device by Does not apply route once a week. Please take weight and record in Bab[employer]cripts once a week.   No current facility-administered medications for this visit. (Other)   REVIEW OF SYSTEMS: ROS   Positive for: Endocrine, Cardiovascular, Eyes Negative for: Constitutional, Gastrointestinal, Neurological, Skin, Genitourinary, Musculoskeletal, HENT, Respiratory, Psychiatric, Allergic/Imm, Heme/Lymph Last edited by Debbrah Alar, COT on 03/07/2021  8:24 AM.     ALLERGIES Allergies  Allergen Reactions   Amoxicillin Hives   Augmentin [Amoxicillin-Pot Clavulanate] Hives   Penicillins Hives    Has patient had a PCN reaction causing immediate rash, facial/tongue/throat swelling, SOB or lightheadedness with hypotension: No Has patient had a PCN reaction causing severe rash involving mucus membranes or skin necrosis: No Has patient had a PCN reaction that  required hospitalization No Has patient had a PCN reaction occurring within the last 10 years: No If all of the above answers are "NO", then may proceed with Cephalosporin use.   Shellfish Allergy Swelling and Other (See Comments)    Itching lips   Suprax [Cefixime]     Pt unsure reaction, may have been childhood reaction   Latex Rash   PAST MEDICAL HISTORY Past Medical History:  Diagnosis Date   ADHD  (attention deficit hyperactivity disorder)    Asthma    Childhood   Diabetic retinopathy ([place])    Elevated blood pressure reading without diagnosis of hypertension    Endometrial mass    History of 2019 novel coronavirus disease (COVID-19) 03/09/2020   positive result in epic,  per pt mild to moderate symptoms that resolved   History of asthma    child   History of gonorrhea 2016   Hypertensive retinopathy    Insulin dependent type 2 diabetes mellitus ([place])    followd by pcp---- (11-16-2020 per pt checks blood sugar at home 4-5 times daily,  fasting sugar--- 70--95)   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 See comment>>a molar pregnancy is not excluded; correlation with beta-HCG is  recommended.  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   Past Surgical History:  Procedure Laterality Date   DILATATION & CURETTAGE/HYSTEROSCOPY WITH MYOSURE N/A 11/21/2020   Procedure: [place];  Surgeon: Aletha Halim, MD;  Location: [facility];  Service: Gynecology;  Laterality: N/A;   INJECTION OF SILICONE OIL Right 0/16/5537   Procedure: INJECTION OF SILICONE OIL;  Surgeon: Bernarda Caffey, MD;  Location: [facility];  Service: Ophthalmology;  Laterality: Right;   KNEE ARTHROSCOPY W/ ACL RECONSTRUCTION Left 2011   MEMBRANE PEEL Right 02/07/2021   Procedure: MEMBRANE PEEL;  Surgeon: Bernarda Caffey, MD;  Location: [facility];  Service: Ophthalmology;  Laterality: Right;   PARS PLANA VITRECTOMY Right 02/07/2021   Procedure: TWENTY-FIVE  GAUGE PARS PLANA VITRECTOMY;  Surgeon: Bernarda Caffey, MD;  Location: [facility];  Service: Ophthalmology;  Laterality: Right;   PERFLUORONE INJECTION Right 02/07/2021   Procedure: PERFLUORON INJECTION;  Surgeon: Bernarda Caffey, MD;  Location: [facility];  Service: Ophthalmology;  Laterality: Right;   PHOTOCOAGULATION WITH LASER Right 02/07/2021   Procedure: PHOTOCOAGULATION WITH LASER;  Surgeon: Bernarda Caffey, MD;  Location: [facility];  Service: Ophthalmology;  Laterality: Right;   TYMPANOSTOMY TUBE PLACEMENT Bilateral    child   FAMILY HISTORY Family History  Problem Relation Age of Onset   Diabetes Mother    Vision loss Mother    ADD / ADHD Brother    Allergies Brother    Asthma Brother    Vision loss Brother    Cancer Paternal Grandfather        Colon Cancer   Heart disease Father 62       died from MI at age 6   SOCIAL HISTORY Social History   Tobacco Use   Smoking status: Every Day    Packs/day: 0.25    Years: 6.00    Pack years: 1.50    Types: Cigarettes   Smokeless  tobacco: Never   Tobacco comments:    11-16-2020  per pt 3 cig per day  Vaping Use   Vaping Use: Former   Devices: uses different types  Substance Use Topics   Alcohol use: Yes    Comment: socially   Drug use: Not Currently    Types: Marijuana       OPHTHALMIC EXAM:  Base Eye Exam     Visual Acuity (Snellen - Linear)       Right Left   Dist [place] CF at face 20/20         Tonometry (Tonopen, 8:23 AM)       Right Left   Pressure 16 20         Pupils       Dark Light Shape React APD   Right 4 4 Round Minimal None   Left 4 3 Round Slow None         Visual Fields       Left Right   Restrictions  Total superior temporal, inferior temporal, superior nasal, inferior nasal deficiencies         Extraocular Movement       Right Left    Full, Ortho Full, Ortho         Neuro/Psych     Oriented x3: Yes   Mood/Affect: Normal         Dilation     Right eye: 1.0% Mydriacyl, 2.5% Phenylephrine @ 8:23 AM           Slit Lamp and Fundus Exam      External Exam       Right Left   External Normal Normal         Slit Lamp Exam       Right Left   Lids/Lashes Normal Normal   Conjunctiva/Sclera Minimal Subconjunctival hemorrhage improving, mild melanosis melanosis   Cornea Epithelial defect closed, trace Punctate epithelial erosions trace Punctate epithelial erosions, mild tear film debris   Anterior Chamber deep and clear, narrow temporal angle deep and clear, narrow temporal angle   Iris round and dilated,  no NVI Round and moderateley dilated   Lens 1-2+ cortical 1-2+ cortical   Vitreous post vitrectomy, 90-95% oil fill mild syneresis         Fundus Exam       Right Left   Disc Mild pallor, sharp rim, mild heme -- improved, no NVD and fibrosis improved     C/D Ratio 0.2 0.2   Macula flat under oil, fibrosis improved; shallow residual SRF sup mac    Vessels severe attenuation, scattered heme, fibrosis improved    Periphery Dense 360 PRP, scattered heme greatest ST arcades -- improving, focal subretinal heme just outside ST arcades -- now white             IMAGING AND PROCEDURES  Imaging and Procedures for 03/07/2021  OCT, Retina - OU - Both Eyes       Right Eye Quality was good. Central Foveal Thickness: 323. Progression has improved. Findings include abnormal foveal contour, intraretinal fluid, epiretinal membrane, intraretinal hyper-reflective material, subretinal fluid (Massive improvement in tractional fibrosis and SRF; mild residual SRF sup macula; +cystic changes).   Left Eye Quality was good. Central Foveal Thickness: 268. Progression has been stable. Findings include normal foveal contour, no SRF, no IRF, intraretinal hyper-reflective material (Trace cystic changes, trace ERM).   Notes *Images captured and stored on drive  Diagnosis / Impression:  OD: Massive improvement in  tractional fibrosis and SRF; mild residual SRF sup macula; +cystic changes OS: NFP, no IRF/SRF; trace cystic changes and trace  ERM  Clinical management:  See below  Abbreviations: NFP - Normal foveal profile. CME - cystoid macular edema. PED - pigment epithelial detachment. IRF - intraretinal fluid. SRF - subretinal fluid. EZ - ellipsoid zone. ERM - epiretinal membrane. ORA - outer retinal atrophy. ORT - outer retinal tubulation. SRHM - subretinal hyper-reflective material. IRHM - intraretinal hyper-reflective material             ASSESSMENT/PLAN:    ICD-10-CM   1. Proliferative diabetic retinopathy of both eyes with macular edema associated with type 2 diabetes mellitus ([place])  S49.6759     2. Vitreous hemorrhage, right eye (HCC)  H43.11     3. Retinal edema  H35.81 OCT, Retina - OU - Both Eyes    4. Essential hypertension  I10     5. Hypertensive retinopathy of both eyes  H35.033     6. Proliferative diabetic retinopathy of right eye with macular edema associated with type 2 diabetes mellitus ([place])  E11.3511     1-3. 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 edema and VH; OS without DME - FA (07.01.22) shows +NV OU (OD >> OS); late-leaking MA, vascular nonperfusion -- would benefit from PRP OU - s/p IVA OD #1 (07.15.22), #2 (09.12.22) - s/p PRP OD (06.17.22) - s/p PRP OS (07.29.22) - s/p POW4 s/p PPV/MP/PFO/EL/FAX/5000cs SO + IVA OD, 09.16.2022 for TRD             - retinal fibrosis and traction improved under oil  - OCT shows, Massive improvement in tractional fibrosis and SRF; mild residual SRF sup macula; +cystic changes             - IOP good at 16 - BCVA CF OD - taper Prednisolone TID x1 week, BID x1 week, qd x1 week, then stop  - PSO ung qhs and prn during the day OD              - can d/c face down positioning; avoid laying flat on back             - post op drop and positioning instructions reviewed              - tylenol/ibuprofen for pain              - f/u 4 weeks DFE, OCT  4,5. Hypertensive retinopathy OU - discussed importance of tight BP control - monitor   Ophthalmic Meds  Ordered this visit:  No orders of the defined types were placed in this encounter.    Return in about 4 weeks (around 04/04/2021) for DFE, OCT.  There are no Patient Instructions on file for this visit.  This document serves as a record of services personally performed by Gardiner Sleeper, MD, PhD. It was created on their behalf by San Jetty. Owens Shark, OA an ophthalmic technician. The creation of this record is the provider's dictation and/or activities during the visit.    Electronically signed by: San Jetty. Owens Shark, [place] 10.10.2022 10:49 PM  This document serves as a record of services personally performed by Gardiner Sleeper, MD, PhD. It was created on their behalf by Leonie Douglas, an ophthalmic technician. The creation of this record is the provider's dictation and/or activities during the visit.    Electronically signed by: Leonie Douglas COA, 03/07/21  10:49 PM  Gardiner Sleeper, M.D.,  Ph.D. Diseases & Surgery of the Retina and Vitreous [facility]  I have reviewed the above documentation for accuracy and completeness, and I agree with the above. Gardiner Sleeper, M.D., Ph.D. 03/07/21 10:55 PM  Abbreviations: M myopia (nearsighted); A astigmatism; H hyperopia (farsighted); P presbyopia; Mrx spectacle prescription;  CTL contact lenses; OD right eye; OS left eye; OU both eyes  XT exotropia; ET esotropia; PEK punctate epithelial keratitis; PEE punctate epithelial erosions; DES dry eye syndrome; MGD meibomian gland dysfunction; ATs artificial tears; PFAT's preservative free artificial tears; [place] nuclear sclerotic cataract; PSC posterior subcapsular cataract; ERM epi-retinal membrane; PVD posterior vitreous detachment; RD retinal detachment; DM diabetes mellitus; DR diabetic retinopathy; NPDR non-proliferative diabetic retinopathy; PDR proliferative diabetic retinopathy; CSME clinically significant macular edema; DME diabetic macular edema; dbh dot blot hemorrhages; CWS cotton wool  spot; POAG primary open angle glaucoma; C/D cup-to-disc ratio; HVF humphrey visual field; GVF goldmann visual field; OCT optical coherence tomography; IOP intraocular pressure; BRVO Branch retinal vein occlusion; CRVO central retinal vein occlusion; CRAO central retinal artery occlusion; BRAO branch retinal artery occlusion; RT retinal tear; SB scleral buckle; PPV pars plana vitrectomy; VH Vitreous hemorrhage; PRP panretinal laser photocoagulation; IVK intravitreal kenalog; VMT vitreomacular traction; MH Macular hole;  NVD neovascularization of the disc; NVE neovascularization elsewhere; AREDS age related eye disease study; ARMD age related macular degeneration; POAG primary open angle glaucoma; EBMD epithelial/anterior basement membrane dystrophy; ACIOL anterior chamber intraocular lens; IOL intraocular lens; PCIOL posterior chamber intraocular lens; Phaco/IOL phacoemulsification with intraocular lens placement; [place] photorefractive keratectomy; LASIK laser assisted in situ keratomileusis; HTN hypertension; DM diabetes mellitus; COPD chronic obstructive pulmonary disease

## 2021-03-07 ENCOUNTER — Encounter (INDEPENDENT_AMBULATORY_CARE_PROVIDER_SITE_OTHER): Payer: Self-pay | Admitting: Ophthalmology

## 2021-03-07 ENCOUNTER — Other Ambulatory Visit: Payer: Self-pay

## 2021-03-07 ENCOUNTER — Ambulatory Visit (INDEPENDENT_AMBULATORY_CARE_PROVIDER_SITE_OTHER): Payer: Medicaid Other | Admitting: Ophthalmology

## 2021-03-07 DIAGNOSIS — H4311 Vitreous hemorrhage, right eye: Secondary | ICD-10-CM | POA: Diagnosis not present

## 2021-03-07 DIAGNOSIS — E113513 Type 2 diabetes mellitus with proliferative diabetic retinopathy with macular edema, bilateral: Secondary | ICD-10-CM | POA: Diagnosis not present

## 2021-03-07 DIAGNOSIS — H3581 Retinal edema: Secondary | ICD-10-CM

## 2021-03-07 DIAGNOSIS — H35033 Hypertensive retinopathy, bilateral: Secondary | ICD-10-CM | POA: Diagnosis not present

## 2021-03-07 DIAGNOSIS — I10 Essential (primary) hypertension: Secondary | ICD-10-CM | POA: Diagnosis not present

## 2021-03-07 DIAGNOSIS — E113511 Type 2 diabetes mellitus with proliferative diabetic retinopathy with macular edema, right eye: Secondary | ICD-10-CM

## 2021-04-03 NOTE — Progress Notes (Signed)
[facility] Note  04/04/2021   CHIEF COMPLAINT Patient presents for Post-op Follow-up   HISTORY OF PRESENT ILLNESS: Mercedes Valdez is a 25 y.o. female who presents to the clinic today for:   HPI     Post-op Follow-up   In right eye.  Discomfort includes itching and tearing.  I, the attending physician,  performed the HPI with the patient and updated documentation appropriately.        Comments   8 week post op PPV+MP+SO OD-  No changes since last visit 4 weeks ago.  Finished with all drops. Having problems with her teeth on the right side.  Her dentist appt in Monday.  She believes this is causing some pain in OD.  BS 138  A1C too high to read 02/2021      Last edited by Bernarda Caffey, MD on 04/05/2021 12:45 PM.     Pt reports subjective improvement in vision OD  Referring physician: Dr. Anthony Sar [address]  [place], [place] [address]  HISTORICAL INFORMATION:   Selected notes from the MEDICAL RECORD NUMBER Referred by Dr. Marin Comment LEE: unsure Ocular Hx- decreased vision due to Diabetes PMH- DM    CURRENT MEDICATIONS: Current Outpatient Medications (Ophthalmic Drugs)  Medication Sig   bacitracin-polymyxin b (POLYSPORIN) ophthalmic ointment Place a 1/2 inch ribbon of ointment into the right lower eyelid at night and as needed during the day. (Patient not taking: Reported on 04/04/2021)   prednisoLONE acetate (PRED FORTE) 1 % ophthalmic suspension Place 1 drop into the left eye 4 (four) times daily. (Patient not taking: Reported on 04/04/2021)   No current facility-administered medications for this visit. (Ophthalmic Drugs)   Current Outpatient Medications (Other)  Medication Sig   glimepiride (AMARYL) 1 MG tablet Take 1 tablet (1 mg total) by mouth daily with breakfast.   insulin aspart (NOVOLOG) 100 UNIT/ML FlexPen Inject 10 Units into the skin 3 (three) times daily before meals.   insulin glargine (LANTUS SOLOSTAR) 100 UNIT/ML Solostar Pen  Inject 35 Units into the skin daily.   Accu-Chek Softclix Lancets lancets To check blood sugars 4 times a day. Fasting and 2 hours after breakfast, lunch and dinner.   Blood Pressure Monitoring (BLOOD PRESSURE KIT) DEVI 1 Device by Does not apply route once a week. Please take blood pressure once a week and record in [employer]s.   doxycycline (VIBRAMYCIN) 100 MG capsule Take 1 capsule (100 mg total) by mouth 2 (two) times daily. (Patient not taking: Reported on 04/04/2021)   glucose blood (ACCU-CHEK GUIDE) test strip To check blood sugars 4 times a day. Fasting, and 2 hours after Breakfast, Lunch and Dinner   Insulin Pen Needle (ADVOCATE INSULIN PEN NEEDLES) 33G X 4 MM MISC 1 Device by Does not apply route in the morning, at noon, in the evening, and at bedtime.   Misc. Devices (GOJJI WEIGHT SCALE) MISC 1 Device by Does not apply route once a week. Please take weight and record in Babyscripts once a week.   No current facility-administered medications for this visit. (Other)   REVIEW OF SYSTEMS: ROS   Positive for: Endocrine, Cardiovascular, Eyes Negative for: Constitutional, Gastrointestinal, Neurological, Skin, Genitourinary, Musculoskeletal, HENT, Respiratory, Psychiatric, Allergic/Imm, Heme/Lymph Last edited by Leonie Douglas, COA on 04/04/2021  9:03 AM.      ALLERGIES Allergies  Allergen Reactions   Amoxicillin Hives   Augmentin [Amoxicillin-Pot Clavulanate] Hives   Penicillins Hives    Has patient had a PCN  reaction causing immediate rash, facial/tongue/throat swelling, SOB or lightheadedness with hypotension: No Has patient had a PCN reaction causing severe rash involving mucus membranes or skin necrosis: No Has patient had a PCN reaction that required hospitalization No Has patient had a PCN reaction occurring within the last 10 years: No If all of the above answers are "NO", then may proceed with Cephalosporin use.   Shellfish Allergy Swelling and Other (See Comments)     Itching lips   Suprax [Cefixime]     Pt unsure reaction, may have been childhood reaction   Latex Rash   PAST MEDICAL HISTORY Past Medical History:  Diagnosis Date   ADHD (attention deficit hyperactivity disorder)    Asthma    Childhood   Diabetic retinopathy ([place])    Elevated blood pressure reading without diagnosis of hypertension    Endometrial mass    History of 2019 novel coronavirus disease (COVID-19) 03/09/2020   positive result in epic,  per pt mild to moderate symptoms that resolved   History of asthma    child   History of gonorrhea 2016   Hypertensive retinopathy    Insulin dependent type 2 diabetes mellitus ([place])    followd by pcp---- (11-16-2020 per pt checks blood sugar at home 4-5 times daily,  fasting sugar--- 70--95)   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 See comment>>a molar pregnancy is not excluded; correlation with beta-HCG is  recommended.  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   Past Surgical History:  Procedure Laterality Date   DILATATION & CURETTAGE/HYSTEROSCOPY WITH MYOSURE N/A 11/21/2020   Procedure: [place];  Surgeon: Aletha Halim, MD;  Location: [facility];  Service: Gynecology;  Laterality: N/A;   INJECTION OF SILICONE OIL Right 6/96/7893   Procedure: INJECTION OF SILICONE OIL;  Surgeon: Bernarda Caffey, MD;  Location: [facility];  Service: Ophthalmology;  Laterality: Right;   KNEE ARTHROSCOPY W/ ACL RECONSTRUCTION Left 2011   MEMBRANE PEEL Right 02/07/2021   Procedure: MEMBRANE PEEL;  Surgeon: Bernarda Caffey, MD;  Location: [facility];   Service: Ophthalmology;  Laterality: Right;   PARS PLANA VITRECTOMY Right 02/07/2021   Procedure: TWENTY-FIVE  GAUGE PARS PLANA VITRECTOMY;  Surgeon: Bernarda Caffey, MD;  Location: [facility];  Service: Ophthalmology;  Laterality: Right;   PERFLUORONE INJECTION Right 02/07/2021   Procedure: PERFLUORON INJECTION;  Surgeon: Bernarda Caffey, MD;  Location: [facility];  Service: Ophthalmology;  Laterality: Right;   PHOTOCOAGULATION WITH LASER Right 02/07/2021   Procedure: PHOTOCOAGULATION WITH LASER;  Surgeon: Bernarda Caffey, MD;  Location: [facility];  Service: Ophthalmology;  Laterality: Right;   TYMPANOSTOMY TUBE PLACEMENT Bilateral    child   FAMILY HISTORY Family History  Problem Relation Age of Onset   Diabetes Mother    Vision loss Mother    ADD / ADHD Brother    Allergies Brother    Asthma Brother    Vision loss Brother    Cancer Paternal Grandfather        Colon Cancer   Heart disease Father 38       died from MI at age 10   [place]  History   Tobacco Use   Smoking status: Every Day    Packs/day: 0.25    Years: 6.00    Pack years: 1.50    Types: Cigarettes   Smokeless tobacco: Never   Tobacco comments:    11-16-2020  per pt 3 cig per day  Vaping Use   Vaping Use: Former   Devices: uses different types  Substance Use Topics   Alcohol use: Yes    Comment: socially   Drug use: Not Currently    Types: Marijuana       OPHTHALMIC EXAM:  Base Eye Exam     Visual Acuity (Snellen - Linear)       Right Left   Dist [place] CF 3' 20/30   Dist ph [place]  20/20-         Tonometry (Tonopen, 9:11 AM)       Right Left   Pressure 6 21         Tonometry #2 (Tonopen, 9:11 AM)       Right Left   Pressure 5 21         Pupils       Dark Light Shape React APD   Right 4 3 Round Minimal None   Left 4 3 Round Slow None         Visual Fields (Counting fingers)       Left Right    Full    Restrictions  Total inferior temporal deficiency         Extraocular Movement        Right Left    Full Full         Neuro/Psych     Oriented x3: Yes   Mood/Affect: Normal         Dilation     Both eyes: 1.0% Mydriacyl, 2.5% Phenylephrine @ 9:11 AM           Slit Lamp and Fundus Exam     External Exam       Right Left   External Normal Normal         Slit Lamp Exam       Right Left   Lids/Lashes Normal Normal   Conjunctiva/Sclera mild melanosis, sutures dissolved melanosis   Cornea trace Punctate epithelial erosions trace Punctate epithelial erosions, mild tear film debris   Anterior Chamber deep and clear, narrow temporal angle deep and clear, narrow temporal angle   Iris round and dilated,  no NVI Round and moderateley dilated   Lens 1-2+ cortical, trace Posterior subcapsular cataract 1-2+ cortical   Vitreous post vitrectomy, 90-95% oil fill mild syneresis         Fundus Exam       Right Left   Disc Mild pallor, sharp rim, mild heme -- improved, no NVD and fibrosis improved  pink and sharp, no NVD   C/D Ratio 0.2 0.2   Macula flat under oil, fibrosis improved; shallow residual SRF sup mac - improving flat, good reflex, scattered MA   Vessels severe attenuation, scattered heme, fibrosis improved attenuated, tortuous, AV crossing chagnes   Periphery Dense 360 PRP, scattered heme greatest ST arcades -- improving, focal subretinal heme just outside ST arcades -- now white, +fibrosis outside ST arcades attached, scattered MA, CWS about the disc, good 360 PRP laser changes            IMAGING AND PROCEDURES  Imaging and Procedures for 04/04/2021  OCT, Retina - OU - Both Eyes  Right Eye Quality was good. Central Foveal Thickness: 302. Progression has improved. Findings include abnormal foveal contour, intraretinal fluid, epiretinal membrane, intraretinal hyper-reflective material, subretinal fluid (Massive improvement in tractional fibrosis and SRF; interval improvement in IRF).   Left Eye Quality was good. Central Foveal  Thickness: 275. Progression has been stable. Findings include normal foveal contour, no SRF, no IRF, intraretinal hyper-reflective material (Trace cystic changes, trace ERM).   Notes *Images captured and stored on drive  Diagnosis / Impression:  OD: Massive improvement in tractional fibrosis and SRF; interval improvement in IRF OS: NFP, no IRF/SRF; trace cystic changes and trace ERM  Clinical management:  See below  Abbreviations: NFP - Normal foveal profile. CME - cystoid macular edema. PED - pigment epithelial detachment. IRF - intraretinal fluid. SRF - subretinal fluid. EZ - ellipsoid zone. ERM - epiretinal membrane. ORA - outer retinal atrophy. ORT - outer retinal tubulation. SRHM - subretinal hyper-reflective material. IRHM - intraretinal hyper-reflective material            ASSESSMENT/PLAN:    ICD-10-CM   1. Proliferative diabetic retinopathy of both eyes with macular edema associated with type 2 diabetes mellitus ([place])  D78.2423     2. Vitreous hemorrhage, right eye (HCC)  H43.11     3. Retinal edema  H35.81 OCT, Retina - OU - Both Eyes    4. Essential hypertension  I10     5. Hypertensive retinopathy of both eyes  H35.033      1-3. 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 edema and VH; OS without DME - FA (07.01.22) shows +NV OU (OD >> OS); late-leaking MA, vascular nonperfusion -- would benefit from PRP OU - s/p IVA OD #1 (07.15.22), #2 (09.12.22) - s/p PRP OD (06.17.22) - s/p PRP OS (07.29.22) - s/p POW4 s/p PPV/MP/PFO/EL/FAX/5000cs SO + IVA OD, 09.16.2022 for TRD             - retinal fibrosis and traction improved under oil  - OCT shows massive improvement in tractional fibrosis and SRF; interval improvement in IRF             - IOP good at 6 - BCVA CF OD - PSO ung qhs and prn during the day OD              - tylenol/ibuprofen for pain   - pt released to return to work full time             - f/u 4 weeks -- POV, DFE, OCT  4,5. Hypertensive retinopathy  OU - discussed importance of tight BP control - monitor   Ophthalmic Meds Ordered this visit:  No orders of the defined types were placed in this encounter.    Return in about 4 weeks (around 05/02/2021) for f/u PDR OU, DFE, OCT.  There are no Patient Instructions on file for this visit.  This document serves as a record of services personally performed by Gardiner Sleeper, MD, PhD. It was created on their behalf by San Jetty. Owens Shark, OA an ophthalmic technician. The creation of this record is the provider's dictation and/or activities during the visit.    Electronically signed by: San Jetty. Marguerita Merles 11.09.2022 12:53 PM   Gardiner Sleeper, M.D., Ph.D. Diseases & Surgery of the Retina and Vitreous [facility]  I have reviewed the above documentation for accuracy and completeness, and I agree with the above. Gardiner Sleeper, M.D., Ph.D. 04/05/21 12:53 PM  Abbreviations: M myopia (nearsighted); A astigmatism; H hyperopia (farsighted); P presbyopia; Mrx spectacle prescription;  CTL contact lenses; OD right eye; OS left eye; OU both eyes  XT exotropia; ET esotropia; PEK punctate epithelial keratitis; PEE punctate epithelial erosions; DES dry eye syndrome; MGD meibomian gland dysfunction; ATs artificial tears; PFAT's preservative free artificial tears; [place] nuclear sclerotic cataract; PSC posterior subcapsular cataract; ERM epi-retinal membrane; PVD posterior vitreous detachment; RD retinal detachment; DM diabetes mellitus; DR diabetic retinopathy; NPDR non-proliferative diabetic retinopathy; PDR proliferative diabetic retinopathy; CSME clinically significant macular edema; DME diabetic macular edema; dbh dot blot hemorrhages; CWS cotton wool spot; POAG primary open angle glaucoma; C/D cup-to-disc ratio; HVF humphrey visual field; GVF goldmann visual field; OCT optical coherence tomography; IOP intraocular pressure; BRVO Branch retinal vein occlusion; CRVO central retinal vein  occlusion; CRAO central retinal artery occlusion; BRAO branch retinal artery occlusion; RT retinal tear; SB scleral buckle; PPV pars plana vitrectomy; VH Vitreous hemorrhage; PRP panretinal laser photocoagulation; IVK intravitreal kenalog; VMT vitreomacular traction; MH Macular hole;  NVD neovascularization of the disc; NVE neovascularization elsewhere; AREDS age related eye disease study; ARMD age related macular degeneration; POAG primary open angle glaucoma; EBMD epithelial/anterior basement membrane dystrophy; ACIOL anterior chamber intraocular lens; IOL intraocular lens; PCIOL posterior chamber intraocular lens; Phaco/IOL phacoemulsification with intraocular lens placement; Reader photorefractive keratectomy; LASIK laser assisted in situ keratomileusis; HTN hypertension; DM diabetes mellitus; COPD chronic obstructive pulmonary disease

## 2021-04-04 ENCOUNTER — Other Ambulatory Visit: Payer: Self-pay

## 2021-04-04 ENCOUNTER — Ambulatory Visit (INDEPENDENT_AMBULATORY_CARE_PROVIDER_SITE_OTHER): Payer: Medicaid Other | Admitting: Ophthalmology

## 2021-04-04 DIAGNOSIS — H3581 Retinal edema: Secondary | ICD-10-CM

## 2021-04-04 DIAGNOSIS — H35033 Hypertensive retinopathy, bilateral: Secondary | ICD-10-CM

## 2021-04-04 DIAGNOSIS — I10 Essential (primary) hypertension: Secondary | ICD-10-CM

## 2021-04-04 DIAGNOSIS — H4311 Vitreous hemorrhage, right eye: Secondary | ICD-10-CM

## 2021-04-04 DIAGNOSIS — E113513 Type 2 diabetes mellitus with proliferative diabetic retinopathy with macular edema, bilateral: Secondary | ICD-10-CM

## 2021-04-05 ENCOUNTER — Encounter (INDEPENDENT_AMBULATORY_CARE_PROVIDER_SITE_OTHER): Payer: Self-pay | Admitting: Ophthalmology

## 2021-04-26 NOTE — Progress Notes (Signed)
[facility] Note  05/03/2021   CHIEF COMPLAINT Patient presents for Retina Follow Up   HISTORY OF PRESENT ILLNESS: Mercedes Valdez is a 25 y.o. female who presents to the clinic today for:   HPI     Retina Follow Up   Patient presents with  Diabetic Retinopathy.  In both eyes.  This started months ago.  Severity is moderate.  Duration of 4 weeks.  Since onset it is stable.  I, the attending physician,  performed the HPI with the patient and updated documentation appropriately.        Comments   25 y/o female pt here for 4 wk f/u for PDR OU.  S/p PPV/MP OD 9.15.22 for TRD.  No change in [place] OD noticed.  Feels [facility] OS has been slightly blurred for a few days.  Has occasional headaches, but thinks that may be due to a tooth issue.  Denies FOL, floaters.  No gtts.  No longer using PSO ung.  BS 125 last night.  Last A1C "too high to read."      Last edited by Mercedes Caffey, MD on 05/05/2021  1:35 AM.    Pt states vision is getting a bit better OD. She is in the process of changes insurances from [facility]. Reports use of just art tears, no longer using ointment. Needing a return to work note as of 04/04/21.   Referring physician: Dr. Anthony Sar [phone] [place], [place] [address]  HISTORICAL INFORMATION:   Selected notes from the MEDICAL RECORD NUMBER Referred by Dr. Marin Comment LEE: unsure Ocular Hx- decreased vision due to Diabetes PMH- DM    CURRENT MEDICATIONS: Current Outpatient Medications (Ophthalmic Drugs)  Medication Sig   bacitracin-polymyxin b (POLYSPORIN) ophthalmic ointment Place a 1/2 inch ribbon of ointment into the right lower eyelid at night and as needed during the day. (Patient not taking: Reported on 05/03/2021)   prednisoLONE acetate (PRED FORTE) 1 % ophthalmic suspension Place 1 drop into the left eye 4 (four) times daily. (Patient not taking: Reported on 05/03/2021)   No current facility-administered medications for this visit.  (Ophthalmic Drugs)   Current Outpatient Medications (Other)  Medication Sig   Accu-Chek Softclix Lancets lancets To check blood sugars 4 times a day. Fasting and 2 hours after breakfast, lunch and dinner.   Blood Pressure Monitoring (BLOOD PRESSURE KIT) DEVI 1 Device by Does not apply route once a week. Please take blood pressure once a week and record in Babyscripts.   clindamycin (CLEOCIN) 150 MG capsule Take 150 mg by mouth 4 (four) times daily.   doxycycline (VIBRAMYCIN) 100 MG capsule Take 1 capsule (100 mg total) by mouth 2 (two) times daily.   glucose blood (ACCU-CHEK GUIDE) test strip To check blood sugars 4 times a day. Fasting, and 2 hours after Breakfast, Lunch and Dinner   ibuprofen (ADVIL) 800 MG tablet Take 800 mg by mouth 3 (three) times daily.   insulin aspart (NOVOLOG) 100 UNIT/ML FlexPen Inject 10 Units into the skin 3 (three) times daily before meals.   insulin glargine (LANTUS SOLOSTAR) 100 UNIT/ML Solostar Pen Inject 35 Units into the skin daily.   Insulin Pen Needle (ADVOCATE INSULIN PEN NEEDLES) 33G X 4 MM MISC 1 Device by Does not apply route in the morning, at noon, in the evening, and at bedtime.   Misc. Devices (GOJJI WEIGHT SCALE) MISC 1 Device by Does not apply route once a week. Please take weight  and record in Babyscripts once a week.   glimepiride (AMARYL) 1 MG tablet Take 1 tablet (1 mg total) by mouth daily with breakfast.   No current facility-administered medications for this visit. (Other)   REVIEW OF SYSTEMS: ROS   Positive for: Endocrine, Eyes, Respiratory Negative for: Constitutional, Gastrointestinal, Neurological, Skin, Genitourinary, Musculoskeletal, HENT, Cardiovascular, Psychiatric, Allergic/Imm, Heme/Lymph Last edited by Matthew Folks, COA on 05/03/2021  1:26 PM.      ALLERGIES Allergies  Allergen Reactions   Amoxicillin Hives   Augmentin [Amoxicillin-Pot Clavulanate] Hives   Penicillins Hives    Has patient had a PCN reaction causing  immediate rash, facial/tongue/throat swelling, SOB or lightheadedness with hypotension: No Has patient had a PCN reaction causing severe rash involving mucus membranes or skin necrosis: No Has patient had a PCN reaction that required hospitalization No Has patient had a PCN reaction occurring within the last 10 years: No If all of the above answers are "NO", then may proceed with Cephalosporin use.   Shellfish Allergy Swelling and Other (See Comments)    Itching lips   Suprax [Cefixime]     Pt unsure reaction, may have been childhood reaction   Latex Rash   PAST MEDICAL HISTORY Past Medical History:  Diagnosis Date   ADHD (attention deficit hyperactivity disorder)    Asthma    Childhood   Diabetic retinopathy ([place])    Elevated blood pressure reading without diagnosis of hypertension    Endometrial mass    History of 2019 novel coronavirus disease (COVID-19) 03/09/2020   positive result in epic,  per pt mild to moderate symptoms that resolved   History of asthma    child   History of gonorrhea 2016   Hypertensive retinopathy    Insulin dependent type 2 diabetes mellitus ([place])    followd by pcp---- (11-16-2020 per pt checks blood sugar at home 4-5 times daily,  fasting sugar--- 70--95)   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 See comment>>a molar pregnancy is not excluded; correlation with beta-HCG is  recommended.  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   Past Surgical History:  Procedure Laterality Date   DILATATION & CURETTAGE/HYSTEROSCOPY WITH MYOSURE N/A 11/21/2020   Procedure: [place];  Surgeon: Aletha Halim, MD;  Location: [facility];  Service: Gynecology;  Laterality: N/A;   INJECTION OF SILICONE OIL Right 09/28/3974   Procedure: INJECTION OF SILICONE OIL;  Surgeon: Mercedes Caffey, MD;  Location: [facility];  Service: Ophthalmology;  Laterality: Right;   KNEE ARTHROSCOPY W/ ACL RECONSTRUCTION Left 2011   MEMBRANE PEEL Right 02/07/2021   Procedure: MEMBRANE PEEL;  Surgeon: Mercedes Caffey, MD;  Location: [facility];  Service: Ophthalmology;  Laterality: Right;   PARS PLANA VITRECTOMY Right 02/07/2021   Procedure: TWENTY-FIVE  GAUGE PARS PLANA VITRECTOMY;  Surgeon: Mercedes Caffey, MD;  Location: [facility];  Service: Ophthalmology;  Laterality: Right;   PERFLUORONE INJECTION Right 02/07/2021   Procedure: PERFLUORON INJECTION;  Surgeon: Mercedes Caffey, MD;  Location: [facility];  Service: Ophthalmology;  Laterality: Right;   PHOTOCOAGULATION WITH LASER Right 02/07/2021   Procedure: PHOTOCOAGULATION WITH LASER;  Surgeon: Coralyn Pear,  Aaron Edelman, MD;  Location: [facility];  Service: Ophthalmology;  Laterality: Right;   TYMPANOSTOMY TUBE PLACEMENT Bilateral    child   FAMILY HISTORY Family History  Problem Relation Age of Onset   Diabetes Mother    Vision loss Mother    ADD / ADHD Brother    Allergies Brother    Asthma Brother    Vision loss Brother    Cancer Paternal Grandfather        Colon Cancer   Heart disease Father 90       died from MI at age 14   SOCIAL HISTORY Social History   Tobacco Use   Smoking status: Every Day    Packs/day: 0.25    Years: 6.00    Pack years: 1.50    Types: Cigarettes   Smokeless tobacco: Never   Tobacco comments:    11-16-2020  per pt 3 cig per day  Vaping Use   Vaping Use: Former   Devices: uses different types  Substance Use Topics   Alcohol use: Yes    Comment: socially   Drug use: Not Currently    Types: Marijuana       OPHTHALMIC EXAM:  Base Eye Exam     Visual Acuity (Snellen - Linear)       Right Left   Dist [place] 20/250 -  20/40 -2   Dist ph [place] NI 20/25 -2         Tonometry (Tonopen, 1:29 PM)       Right Left   Pressure 11 15         Pupils       Dark Light Shape React APD   Right 4 3 Round Minimal None   Left 4 3 Round Slow None         Visual Fields (Counting fingers)       Left Right    Full    Restrictions  Partial outer inferior temporal deficiency         Extraocular Movement       Right Left    Full, Ortho Full, Ortho         Neuro/Psych     Oriented x3: Yes   Mood/Affect: Normal         Dilation     Both eyes: 1.0% Mydriacyl, 2.5% Phenylephrine @ 1:29 PM           Slit Lamp and Fundus Exam     External Exam       Right Left   External Normal Normal         Slit Lamp Exam       Right Left   Lids/Lashes Normal Normal   Conjunctiva/Sclera mild melanosis, sutures dissolved melanosis   Cornea trace Punctate epithelial erosions, trace endopigment trace Punctate epithelial erosions, mild tear film debris   Anterior Chamber deep and clear, narrow temporal angle deep and clear, narrow temporal angle   Iris round and dilated, no NVI Round and moderateley dilated   Lens 1-2+ cortical, trace Posterior subcapsular cataract 1-2+ cortical   Anterior Vitreous post vitrectomy, ~95% oil fill mild syneresis         Fundus Exam       Right Left   Disc Mild pallor, sharp rim, no heme, no NVD and fibrosis improved pink and sharp, no NVD   C/D Ratio 0.2 0.2   Macula flat under oil, fibrosis improved; shallow residual SRF sup mac - improving flat, good reflex, scattered MA  Vessels severe attenuation, scattered heme, residual fibrosis ST arcades attenuated, tortuous, AV crossing chagnes   Periphery Dense 360 PRP, scattered heme greatest ST arcades -- improving, focal subretinal heme just outside ST arcades -- now white, +fibrosis outside ST arcades attached, scattered MA, CWS about the disc, good 360 PRP laser changes            IMAGING AND PROCEDURES   Imaging and Procedures for 05/03/2021  OCT, Retina - OU - Both Eyes       Right Eye Quality was good. Central Foveal Thickness: 329. Progression has improved. Findings include abnormal foveal contour, intraretinal fluid, epiretinal membrane, intraretinal hyper-reflective material, subretinal fluid (Stable improvement in tractional fibrosis; interval improvement in IRF and SRF).   Left Eye Quality was good. Central Foveal Thickness: 284. Progression has been stable. Findings include normal foveal contour, no SRF, no IRF, intraretinal hyper-reflective material (Trace cystic changes, trace ERM).   Notes *Images captured and stored on drive  Diagnosis / Impression:  OD: Stable improvement in tractional fibrosis; interval improvement in IRF and SRF OS: NFP, no IRF/SRF; trace cystic changes and trace ERM  Clinical management:  See below  Abbreviations: NFP - Normal foveal profile. CME - cystoid macular edema. PED - pigment epithelial detachment. IRF - intraretinal fluid. SRF - subretinal fluid. EZ - ellipsoid zone. ERM - epiretinal membrane. ORA - outer retinal atrophy. ORT - outer retinal tubulation. SRHM - subretinal hyper-reflective material. IRHM - intraretinal hyper-reflective material             ASSESSMENT/PLAN:    ICD-10-CM   1. Proliferative diabetic retinopathy of both eyes with macular edema associated with type 2 diabetes mellitus ([place])  T65.4650     2. Vitreous hemorrhage, right eye (HCC)  H43.11     3. Retinal edema  H35.81 OCT, Retina - OU - Both Eyes    4. Essential hypertension  I10     5. Hypertensive retinopathy of both eyes  H35.033       1-3. 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 edema and VH; OS without DME - FA (07.01.22) shows +NV OU (OD >> OS); late-leaking MA, vascular nonperfusion -- would benefit from PRP OU - s/p IVA OD #1 (07.15.22), #2 (09.12.22) - s/p PRP OD (06.17.22) - s/p PRP OS (07.29.22) - s/p POM3 s/p PPV/MP/PFO/EL/FAX/5000cs SO  + IVA OD, 09.15.2022 for TRD             - retinal fibrosis and traction improved under oil  - OCT shows stable improvement in tractional fibrosis; interval improvement in IRF and SRF             - IOP good at 15 - BCVA OD 20/250 from CF  - no drops or ointment   - pt released to return to work full time as of 11.10.22             - f/u 4-6 weeks -- DFE, OCT  4,5. Hypertensive retinopathy OU - discussed importance of tight BP control - monitor   Ophthalmic Meds Ordered this visit:  No orders of the defined types were placed in this encounter.    Return for 4-6 wk f/u PDR OU , DFE, OCT.  There are no Patient Instructions on file for this visit.  This document serves as a record of services personally performed by Gardiner Sleeper, MD, PhD. It was created on their behalf by Leonie Douglas, an ophthalmic technician. The creation of this record is the  provider's dictation and/or activities during the visit.    Electronically signed by: Leonie Douglas COA, 05/05/21  1:43 AM  Gardiner Sleeper, M.D., Ph.D. Diseases & Surgery of the Retina and Venturia 05/03/2021  I have reviewed the above documentation for accuracy and completeness, and I agree with the above. Gardiner Sleeper, M.D., Ph.D. 05/05/21 1:43 AM  Abbreviations: M myopia (nearsighted); A astigmatism; H hyperopia (farsighted); P presbyopia; Mrx spectacle prescription;  CTL contact lenses; OD right eye; OS left eye; OU both eyes  XT exotropia; ET esotropia; PEK punctate epithelial keratitis; PEE punctate epithelial erosions; DES dry eye syndrome; MGD meibomian gland dysfunction; ATs artificial tears; PFAT's preservative free artificial tears; [place] nuclear sclerotic cataract; PSC posterior subcapsular cataract; ERM epi-retinal membrane; PVD posterior vitreous detachment; RD retinal detachment; DM diabetes mellitus; DR diabetic retinopathy; NPDR non-proliferative diabetic retinopathy; PDR proliferative  diabetic retinopathy; CSME clinically significant macular edema; DME diabetic macular edema; dbh dot blot hemorrhages; CWS cotton wool spot; POAG primary open angle glaucoma; C/D cup-to-disc ratio; HVF humphrey visual field; GVF goldmann visual field; OCT optical coherence tomography; IOP intraocular pressure; BRVO Branch retinal vein occlusion; CRVO central retinal vein occlusion; CRAO central retinal artery occlusion; BRAO branch retinal artery occlusion; RT retinal tear; SB scleral buckle; PPV pars plana vitrectomy; VH Vitreous hemorrhage; PRP panretinal laser photocoagulation; IVK intravitreal kenalog; VMT vitreomacular traction; MH Macular hole;  NVD neovascularization of the disc; NVE neovascularization elsewhere; AREDS age related eye disease study; ARMD age related macular degeneration; POAG primary open angle glaucoma; EBMD epithelial/anterior basement membrane dystrophy; ACIOL anterior chamber intraocular lens; IOL intraocular lens; PCIOL posterior chamber intraocular lens; Phaco/IOL phacoemulsification with intraocular lens placement; [place] photorefractive keratectomy; LASIK laser assisted in situ keratomileusis; HTN hypertension; DM diabetes mellitus; COPD chronic obstructive pulmonary disease

## 2021-05-03 ENCOUNTER — Ambulatory Visit (INDEPENDENT_AMBULATORY_CARE_PROVIDER_SITE_OTHER): Payer: Medicaid Other | Admitting: Ophthalmology

## 2021-05-03 ENCOUNTER — Encounter (INDEPENDENT_AMBULATORY_CARE_PROVIDER_SITE_OTHER): Payer: Self-pay | Admitting: Ophthalmology

## 2021-05-03 ENCOUNTER — Other Ambulatory Visit: Payer: Self-pay

## 2021-05-03 DIAGNOSIS — I10 Essential (primary) hypertension: Secondary | ICD-10-CM

## 2021-05-03 DIAGNOSIS — H35033 Hypertensive retinopathy, bilateral: Secondary | ICD-10-CM

## 2021-05-03 DIAGNOSIS — H4311 Vitreous hemorrhage, right eye: Secondary | ICD-10-CM

## 2021-05-03 DIAGNOSIS — H3581 Retinal edema: Secondary | ICD-10-CM

## 2021-05-03 DIAGNOSIS — E113513 Type 2 diabetes mellitus with proliferative diabetic retinopathy with macular edema, bilateral: Secondary | ICD-10-CM

## 2021-05-05 ENCOUNTER — Encounter (INDEPENDENT_AMBULATORY_CARE_PROVIDER_SITE_OTHER): Payer: Self-pay | Admitting: Ophthalmology

## 2021-05-08 ENCOUNTER — Other Ambulatory Visit (HOSPITAL_COMMUNITY): Payer: Self-pay

## 2021-05-08 ENCOUNTER — Ambulatory Visit (INDEPENDENT_AMBULATORY_CARE_PROVIDER_SITE_OTHER): Payer: Medicaid Other | Admitting: Internal Medicine

## 2021-05-08 ENCOUNTER — Other Ambulatory Visit: Payer: Self-pay

## 2021-05-08 ENCOUNTER — Telehealth: Payer: Self-pay | Admitting: Pharmacy Technician

## 2021-05-08 ENCOUNTER — Encounter: Payer: Self-pay | Admitting: Internal Medicine

## 2021-05-08 VITALS — BP 126/78 | HR 102 | Ht 65.0 in | Wt 232.0 lb

## 2021-05-08 DIAGNOSIS — Z794 Long term (current) use of insulin: Secondary | ICD-10-CM | POA: Diagnosis not present

## 2021-05-08 DIAGNOSIS — E113513 Type 2 diabetes mellitus with proliferative diabetic retinopathy with macular edema, bilateral: Secondary | ICD-10-CM | POA: Diagnosis not present

## 2021-05-08 DIAGNOSIS — E1165 Type 2 diabetes mellitus with hyperglycemia: Secondary | ICD-10-CM

## 2021-05-08 DIAGNOSIS — E785 Hyperlipidemia, unspecified: Secondary | ICD-10-CM | POA: Diagnosis not present

## 2021-05-08 DIAGNOSIS — E119 Type 2 diabetes mellitus without complications: Secondary | ICD-10-CM

## 2021-05-08 LAB — LIPID PANEL
Cholesterol: 224 mg/dL — ABNORMAL HIGH (ref 0–200)
HDL: 44.1 mg/dL (ref >39.00)
LDL Cholesterol: 143 mg/dL — ABNORMAL HIGH (ref 0–99)
NonHDL: 179.88
Total CHOL/HDL Ratio: 5
Triglycerides: 183 mg/dL — ABNORMAL HIGH (ref 0.0–149.0)
VLDL: 36.6 mg/dL (ref 0.0–40.0)

## 2021-05-08 LAB — BASIC METABOLIC PANEL
BUN: 8 mg/dL (ref 6–23)
CO2: 28 mEq/L (ref 19–32)
Calcium: 10.5 mg/dL (ref 8.4–10.5)
Chloride: 100 mEq/L (ref 96–112)
Creatinine, Ser: 0.48 mg/dL (ref 0.40–1.20)
GFR: 129.59 mL/min (ref >60.00)
Glucose, Bld: 100 mg/dL — ABNORMAL HIGH (ref 70–99)
Potassium: 4.6 mEq/L (ref 3.5–5.1)
Sodium: 137 mEq/L (ref 135–145)

## 2021-05-08 LAB — MICROALBUMIN / CREATININE URINE RATIO
Creatinine,U: 149.7 mg/dL
Microalb Creat Ratio: 6.4 mg/g (ref 0.0–30.0)
Microalb, Ur: 9.5 mg/dL — ABNORMAL HIGH (ref 0.0–1.9)

## 2021-05-08 LAB — POCT GLYCOSYLATED HEMOGLOBIN (HGB A1C): Hemoglobin A1C: 11.9 % — AB (ref 4.0–5.6)

## 2021-05-08 LAB — GLUCOSE, POCT (MANUAL RESULT ENTRY): POC Glucose: 136 mg/dl — AB (ref 70–99)

## 2021-05-08 MED ORDER — INSULIN ASPART 100 UNIT/ML FLEXPEN: PEN_INJECTOR | SUBCUTANEOUS | 6 refills | Status: DC

## 2021-05-08 MED ORDER — DEXCOM G6 TRANSMITTER MISC: 1.0000 | 3 refills | Status: DC

## 2021-05-08 MED ORDER — DEXCOM G6 SENSOR MISC: 1.0000 | 3 refills | Status: DC

## 2021-05-08 MED ORDER — LANTUS SOLOSTAR 100 UNIT/ML ~~LOC~~ SOPN: 35.0000 [IU] | PEN_INJECTOR | Freq: Every day | SUBCUTANEOUS | 4 refills | Status: DC

## 2021-05-08 MED ORDER — INSULIN PEN NEEDLE 31G X 5 MM MISC
1.0000 | Freq: Four times a day (QID) | 2 refills | Status: DC
Start: 2021-05-08 — End: 2023-07-16

## 2021-05-08 MED ORDER — EMPAGLIFLOZIN 10 MG PO TABS: 10.0000 mg | ORAL_TABLET | Freq: Every day | ORAL | 4 refills | Status: DC

## 2021-05-08 NOTE — Patient Instructions (Signed)
-   Stop Glimepiride  - Start Jardiance 10 mg daily  - Take Lantus 35 units daily  - Take Novolog 12 units with a meal   - Novolog correctional insulin: ADD extra units on insulin to your meal-time Novolog dose if your blood sugars are higher than 160. Use the scale below to help guide you:   Blood sugar before meal Number of units to inject  Less than 160 0 unit  161 -  190 1 units  191 -  220 2 units  221 -  250 3 units  251 -  280 4 units  281 -  310 5 units  311 -  340 6 units  341 -  370 7 units  371 -  400 8 units   HOW TO TREAT LOW BLOOD SUGARS (Blood sugar LESS THAN 70 MG/DL) Please follow the RULE OF 15 for the treatment of hypoglycemia treatment (when your (blood sugars are less than 70 mg/dL)   STEP 1: Take 15 grams of carbohydrates when your blood sugar is low, which includes:  3-4 GLUCOSE TABS  OR 3-4 OZ OF JUICE OR REGULAR SODA OR ONE TUBE OF GLUCOSE GEL    STEP 2: RECHECK blood sugar in 15 MINUTES STEP 3: If your blood sugar is still low at the 15 minute recheck --> then, go back to STEP 1 and treat AGAIN with another 15 grams of carbohydrates.

## 2021-05-08 NOTE — Telephone Encounter (Addendum)
Patient Advocate Encounter  Received notification from [facility]) that prior authorization for [facility] G6 is required.   PA submitted on 12.14.22 SENSOR Key [id number] TRANSMITTER Key [id number] Status is pending   [facility] will continue to follow  Luciano Cutter, CPhT Patient [facility] Phone: [phone] Fax: [phone]

## 2021-05-08 NOTE — Progress Notes (Signed)
Name: Mercedes Valdez  MRN/ DOB: [id number], [birth year]   Age/ Sex: 25 y.o., female    PCP: Camillia Herter, NP   Reason for Endocrinology Evaluation: Type 2 Diabetes Mellitus     Date of Initial Endocrinology Visit: 05/08/2021     PATIENT IDENTIFIER: Mercedes Valdez is a 25 y.o. female with a past medical history of T2DM, HTN. The patient presented for initial endocrinology clinic visit on 05/08/2021 for consultative assistance with her diabetes management.    HPI: Ms. Finkbiner was    Diagnosed with DM at age 53 Prior Medications tried/Intolerance: Glipizide, Trulicity - cost prohibitive  Currently checking blood sugars 1 x / day Hypoglycemia episodes : no                Hemoglobin A1c has ranged from 13.2% in 08/2020, peaking at 14.8% in 02/2021. Patient required assistance for hypoglycemia: no Patient has required hospitalization within the last 1 year from hyper or hypoglycemia: no  In terms of diet, the patient eats 1-2 meals a day, snacks rarely . 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 Denies: CKD Last eye exam: Completed 05/03/2021  Macrovascular complications:   Denies: CAD, PVD, CVA   PAST HISTORY: Past Medical History:  Past Medical History:  Diagnosis Date   ADHD (attention deficit hyperactivity disorder)    Asthma    Childhood   Diabetic retinopathy ([place])    Elevated blood pressure reading without diagnosis of hypertension    Endometrial mass    History of 2019 novel coronavirus disease (COVID-19) 03/09/2020   positive  result in epic,  per pt mild to moderate symptoms that resolved   History of asthma    child   History of gonorrhea 2016   Hypertensive retinopathy    Insulin dependent type 2 diabetes mellitus ([place])    followd by pcp---- (11-16-2020 per pt checks blood sugar at home 4-5 times daily,  fasting sugar--- 70--95)   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 See comment>>a molar pregnancy is not excluded; correlation with beta-HCG is  recommended.  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   Past Surgical History:  Past Surgical History:  Procedure Laterality Date   DILATATION & CURETTAGE/HYSTEROSCOPY WITH MYOSURE N/A 11/21/2020   Procedure: [place];  Surgeon: Aletha Halim, MD;  Location: [facility];  Service: Gynecology;  Laterality: N/A;   INJECTION OF SILICONE OIL Right 9/70/2637   Procedure: INJECTION OF SILICONE OIL;  Surgeon: Bernarda Caffey, MD;  Location: [facility];  Service: Ophthalmology;  Laterality: Right;   KNEE ARTHROSCOPY W/ ACL RECONSTRUCTION Left 2011   MEMBRANE PEEL Right 02/07/2021   Procedure: MEMBRANE  PEEL;  Surgeon: Bernarda Caffey, MD;  Location: [facility];  Service: Ophthalmology;  Laterality: Right;   PARS PLANA VITRECTOMY Right 02/07/2021   Procedure: TWENTY-FIVE  GAUGE PARS PLANA VITRECTOMY;  Surgeon: Bernarda Caffey, MD;  Location: [facility];  Service: Ophthalmology;  Laterality: Right;   PERFLUORONE INJECTION Right 02/07/2021   Procedure: PERFLUORON INJECTION;  Surgeon: Bernarda Caffey, MD;  Location: [facility];  Service: Ophthalmology;  Laterality: Right;   PHOTOCOAGULATION WITH  LASER Right 02/07/2021   Procedure: PHOTOCOAGULATION WITH LASER;  Surgeon: Bernarda Caffey, MD;  Location: [facility];  Service: Ophthalmology;  Laterality: Right;   TYMPANOSTOMY TUBE PLACEMENT Bilateral    child    Social History:  reports that she has been smoking cigarettes. She has a 1.50 pack-year smoking history. She has never used smokeless tobacco. She reports current alcohol use. She reports that she does not currently use drugs after having used the following drugs: Marijuana. Family History:  Family History  Problem Relation Age of Onset   Diabetes Mother    Vision loss Mother    ADD / ADHD Brother    Allergies Brother    Asthma Brother    Vision loss Brother    Cancer Paternal Grandfather        Colon Cancer   Heart disease Father 85       died from MI at age 58     HOME MEDICATIONS: Allergies as of 05/08/2021       Reactions   Amoxicillin Hives   Augmentin [amoxicillin-pot Clavulanate] Hives   Penicillins Hives   Has patient had a PCN reaction causing immediate rash, facial/tongue/throat swelling, SOB or lightheadedness with hypotension: No Has patient had a PCN reaction causing severe rash involving mucus membranes or skin necrosis: No Has patient had a PCN reaction that required hospitalization No Has patient had a PCN reaction occurring within the last 10 years: No If all of the above answers are "NO", then may proceed with Cephalosporin use.   Shellfish Allergy Swelling, Other (See Comments)   Itching lips   Suprax [cefixime]    Pt unsure reaction, may have been childhood reaction   Latex Rash        Medication List        Accurate as of May 08, 2021  2:20 PM. If you have any questions, ask your nurse or doctor.          STOP taking these medications    bacitracin-polymyxin b ophthalmic ointment Commonly known as: POLYSPORIN Stopped by: Dorita Sciara, MD   clindamycin 150 MG capsule Commonly known as: CLEOCIN Stopped by: Dorita Sciara, MD   doxycycline 100 MG capsule Commonly known as: VIBRAMYCIN Stopped by: Dorita Sciara, MD   prednisoLONE acetate 1 % ophthalmic suspension Commonly known as: PRED FORTE Stopped by: Dorita Sciara, MD       TAKE these medications    Accu-Chek Guide test strip Generic drug: glucose blood To check blood sugars 4 times a day. Fasting, and 2 hours after Breakfast, Lunch and Dinner   Accu-Chek Softclix Lancets lancets To check blood sugars 4 times a day. Fasting and 2 hours after breakfast, lunch and dinner.   Advocate Insulin Pen Needles 33G X 4 MM Misc Generic drug: Insulin Pen Needle 1 Device by Does not apply route in the morning, at noon, in the evening, and at bedtime.   Blood Pressure Kit Devi 1 Device by Does not apply route once a week. Please take blood  pressure once a week and record in Babyscripts.   glimepiride 1 MG tablet Commonly known as: AMARYL Take 1 tablet (1 mg total) by mouth daily with breakfast.   Gojji Weight Scale Misc 1 Device by Does not apply route once a week. Please take weight and record in Babyscripts once a week.   ibuprofen 800 MG tablet Commonly known as: ADVIL Take 800 mg by mouth 3 (three) times daily.   insulin aspart 100 UNIT/ML FlexPen Commonly known as: NOVOLOG Inject 10 Units into the skin 3 (three) times daily before meals.   Lantus SoloStar 100 UNIT/ML Solostar Pen Generic drug: insulin glargine Inject 35 Units into the skin daily.         ALLERGIES: Allergies  Allergen Reactions   Amoxicillin Hives   Augmentin [Amoxicillin-Pot Clavulanate] Hives   Penicillins Hives    Has patient had a PCN reaction causing immediate rash, facial/tongue/throat swelling, SOB or lightheadedness with hypotension: No Has patient had a PCN reaction causing severe rash involving mucus membranes or skin necrosis: No Has patient had a PCN reaction that required hospitalization No Has patient had a PCN reaction  occurring within the last 10 years: No If all of the above answers are "NO", then may proceed with Cephalosporin use.   Shellfish Allergy Swelling and Other (See Comments)    Itching lips   Suprax [Cefixime]     Pt unsure reaction, may have been childhood reaction   Latex Rash     REVIEW OF SYSTEMS: A comprehensive ROS was conducted with the patient and is negative except as per HPI and below:  Review of Systems  Gastrointestinal:  Negative for diarrhea, nausea and vomiting.  Neurological:  Positive for tingling.     OBJECTIVE:   VITAL SIGNS: BP 126/78 (BP Location: Left Arm, Patient Position: Sitting, Cuff Size: Large)    Pulse (!) 102    Ht 5' 5"  (1.651 m)    Wt 232 lb (105.2 kg)    SpO2 99%    BMI 38.61 kg/m    PHYSICAL EXAM:  General: Pt appears well and is in NAD  Neck: General: Supple without adenopathy or carotid bruits. Thyroid: Thyroid size normal.  No goiter or nodules appreciated.   Lungs: Clear with good BS bilat with no rales, rhonchi, or wheezes  Heart: RRR with normal S1 and S2 and no gallops; no murmurs; no rub  Abdomen: Normoactive bowel sounds, soft, nontender, without masses or organomegaly palpable  Extremities:  Lower extremities - No pretibial edema. No lesions.  Skin: Normal texture and temperature to palpation. No rash noted.   Neuro: MS is good with appropriate affect, pt is alert and Ox3     DATA REVIEWED:  Lab Results  Component Value Date   HGBA1C 11.9 (A) 05/08/2021   HGBA1C 14.8 (H) 02/25/2021   HGBA1C 14.0 (H) 01/16/2021    Latest Reference Range & Units 01/16/21 09:27  Sodium 135 - 145 mmol/L 138  Potassium 3.5 - 5.1 mmol/L 3.5  Chloride 98 - 111 mmol/L 108  CO2 22 - 32 mmol/L 23  Glucose 70 - 99 mg/dL 152 (H)  Mean Plasma Glucose mg/dL 355.1  BUN 6 - 20 mg/dL 6  Creatinine 0.44 - 1.00 mg/dL 0.48  Calcium 8.9 - 10.3 mg/dL 8.5 (L)  Anion gap 5 - 15  7  GFR, Estimated >60 mL/min >60     ASSESSMENT / PLAN / RECOMMENDATIONS:    1) Type 2 Diabetes Mellitus, poorly controlled, With retinopathic complications -  Most recent A1c of 11.9 %. Goal A1c <7.0%.    Plan: GENERAL: I have discussed with the patient the pathophysiology of diabetes. We went over the natural progression of the disease. We talked about both insulin resistance and insulin deficiency. We stressed the importance of lifestyle changes including diet and exercise. I explained the complications associated with diabetes including retinopathy, nephropathy, neuropathy as well as increased risk of cardiovascular disease. We went over the benefit seen with glycemic control.   I explained to the patient that diabetic patients are at higher than normal risk for amputations. Discussed pharmacokinetics of basal/bolus insulin and the importance of taking prandial insulin with meals.  We also discussed avoiding sugar-sweetened beverages and snacks, when possible.  Pre-conception counseling done today , pt to wait until A1c 7.0% before trying to conceive, we discussed A1c goal during pregnancy 6.0-6.5%  Will switched Glimepiride to SGLt-2 inhibitors, cautioned against genital infections   MEDICATIONS: - Stop Glimepiride  - Start Jardiance 10 mg daily  - Take Lantus 35 units daily  - Take Novolog 12 units with a meal   EDUCATION / INSTRUCTIONS: BG monitoring instructions: Patient is instructed to check her blood sugars 3 times a day, before meals . Call [facility] if: BG persistently < 70  I reviewed the Rule of 15 for the treatment of hypoglycemia in detail with the patient. Literature supplied.   2) Diabetic complications:  Eye: Does  have known diabetic retinopathy.  Neuro/ Feet: Does not have known diabetic peripheral neuropathy. Renal: Patient does not have known baseline CKD. She is not on an ACEI/ARB at present.    3) Dyslipidemia :   -TG and LDL are trending down but continue to be above goal.  We will encourage lifestyle changes.   No indication for statin therapy at this time    Follow-up in 3 months   Signed electronically by: Mack Guise, MD  [facility]  [facility] [address], [place] [address] Phone: [phone] FAX: [phone]   CC: Camillia Herter, NP [place] [address] Phone: [phone]  Fax: [phone]    Return to Endocrinology clinic as below: No future appointments.

## 2021-05-09 ENCOUNTER — Other Ambulatory Visit (HOSPITAL_COMMUNITY): Payer: Self-pay

## 2021-05-09 DIAGNOSIS — Z794 Long term (current) use of insulin: Secondary | ICD-10-CM | POA: Insufficient documentation

## 2021-05-09 DIAGNOSIS — E785 Hyperlipidemia, unspecified: Secondary | ICD-10-CM | POA: Insufficient documentation

## 2021-05-09 NOTE — Telephone Encounter (Signed)
Patient Advocate Encounter  Prior Authorization for [facility] transmitter & sensor has been approved.    PA# PA Case ID: [id number] (transmitter) PA Case ID: [id number] (sensor) Effective dates: 05/08/21 through 11/04/21  Per Test Claim Patients co-pay is $0.   Spoke with Pharmacy to Process.  Patient Advocate Fax:  [phone]

## 2021-05-14 ENCOUNTER — Telehealth: Payer: Self-pay

## 2021-05-14 ENCOUNTER — Other Ambulatory Visit (HOSPITAL_COMMUNITY): Payer: Self-pay

## 2021-05-14 NOTE — Telephone Encounter (Signed)
Patient Advocate Encounter   Received notification from [facility] that prior authorization for Jardiance 10mg  tabs is required by his/her insurance [place] [employer].  PA submitted on 05/14/21  Key#: [id number]  Status is pending    [facility] will continue to follow:  Patient Advocate Fax:  [phone]

## 2021-05-15 NOTE — Telephone Encounter (Signed)
Patient Advocate Encounter  Received notification from [facility] that the request for prior authorization for Mercedes Valdez has been denied due to  not having tried and failed Metformin.    Found notes that pt was on Metformin in the past, but d/c due to diarrhea.   Called [phone] to request a peer-to-peer review. Left msg. Need fax # to send over office notes regarding stopping Metformin.

## 2021-07-16 ENCOUNTER — Telehealth: Payer: Self-pay | Admitting: Pharmacy Technician

## 2021-07-16 ENCOUNTER — Other Ambulatory Visit (HOSPITAL_COMMUNITY): Payer: Self-pay

## 2021-07-16 NOTE — Telephone Encounter (Signed)
Patient Advocate Encounter  Received notification from [place] ([employer]) that prior authorization for [facility] 10MG  is required.   PA submitted on 2.21.23 Key [id number] Status is pending   [facility] will continue to follow  Luciano Cutter, CPhT Patient Advocate [facility] Phone: [phone] Fax:  [phone]

## 2021-07-19 NOTE — Telephone Encounter (Signed)
Received notification from [place] ([place]) regarding a prior authorization for JARDIANCE 10MG . Authorization has been APPROVED from 2.21.2023 to 2.21.2024.    Authorization # PA Case ID: [id number]

## 2021-08-08 ENCOUNTER — Other Ambulatory Visit: Payer: Self-pay

## 2021-08-08 ENCOUNTER — Encounter: Payer: Self-pay | Admitting: Internal Medicine

## 2021-08-08 ENCOUNTER — Ambulatory Visit (INDEPENDENT_AMBULATORY_CARE_PROVIDER_SITE_OTHER): Payer: Medicaid Other | Admitting: Internal Medicine

## 2021-08-08 VITALS — BP 114/78 | HR 100 | Wt 230.0 lb

## 2021-08-08 DIAGNOSIS — Z794 Long term (current) use of insulin: Secondary | ICD-10-CM

## 2021-08-08 DIAGNOSIS — E1165 Type 2 diabetes mellitus with hyperglycemia: Secondary | ICD-10-CM

## 2021-08-08 DIAGNOSIS — E785 Hyperlipidemia, unspecified: Secondary | ICD-10-CM

## 2021-08-08 DIAGNOSIS — E113513 Type 2 diabetes mellitus with proliferative diabetic retinopathy with macular edema, bilateral: Secondary | ICD-10-CM

## 2021-08-08 LAB — POCT GLYCOSYLATED HEMOGLOBIN (HGB A1C): Hemoglobin A1C: 10.7 % — AB (ref 4.0–5.6)

## 2021-08-08 MED ORDER — EMPAGLIFLOZIN 25 MG PO TABS: 25.0000 mg | ORAL_TABLET | Freq: Every day | ORAL | 2 refills | Status: DC

## 2021-08-08 MED ORDER — LANTUS SOLOSTAR 100 UNIT/ML ~~LOC~~ SOPN: 42.0000 [IU] | PEN_INJECTOR | Freq: Every day | SUBCUTANEOUS | 4 refills | Status: DC

## 2021-08-08 NOTE — Progress Notes (Signed)
?Name: Mercedes Valdez  ?MRN/ DOB: [id number], Dec 04, 1995   ?Age/ Sex: 26 y.o., female   ? ?PCP: Mercedes Herter, NP   ?Reason for Endocrinology Evaluation: Type 2 Diabetes Mellitus  ?   ?Date of Initial Endocrinology Visit: 05/08/2021  ? ? ?PATIENT IDENTIFIER: [name] is a 26 y.o. female with a past medical history of T2DM, HTN. The patient presented for initial endocrinology clinic visit on 05/08/2021  for consultative assistance with her diabetes management.  ? ? ?HPI: ?[name] was  ? ? ?Diagnosed with DM at age 50 ?Prior Medications tried/Intolerance: Glipizide, Trulicity - cost prohibitive               ?Hemoglobin A1c has ranged from 13.2% in 08/2020, peaking at 14.8% in 02/2021. ? ? ?She had a miscarriage in 09/2020 ?No contraception at this time and not sexually active  ?LMP last week  ? ?On her initial visit A1c 11.9%, adjusted MDI, stopped Glimepiride and started Jardiance.  ? ? ?SUBJECTIVE:  ? ?During the last visit (05/08/2021): A1c 11.9% stopped Glimepiride . Started Jardiance and continued MDI regimen  ? ?Today (08/08/21): [name] is here for a follow up on diabetes management. She    checks her blood sugars multiple  times daily, through CGM. The patient has not  had hypoglycemic episodes since the last clinic visit. ? ?Last night she forgot to take prandial insulin before supper and forgot her lantus  ? ?Denies nausea, vomiting or diarrhea  ? ? ? ? ? ?HOME DIABETES REGIMEN: ?Jardiance 10 mg daily  ?NovoLog 12 units TIDQAC ?Lantus 35 units daily  ? ? ? ? ?Statin: no ?ACE-I/ARB: no ?Prior Diabetic Education:  yes  ? ? ?CONTINUOUS GLUCOSE MONITORING RECORD INTERPRETATION   ? ?Dates of Recording: 3/3-3/16/2023 ? ?Sensor description:dexcom  ? ?Results statistics: ?  ?CGM use % of time 86  ?Average and SD 221/77  ?Time in range    37    %  ?% Time Above 180 26  ?% Time above 250 37  ?% Time Below target 0  ? ? ? ? ?Glycemic patterns summary: Hyperglycemia noted over night , trends down  during the day  ? ?Hyperglycemic episodes  postprandial and over night  ? ?Hypoglycemic episodes occurred n/a ? ?Overnight periods: high  ? ? ? ? ? 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 ?Macrovascular complications:  ? ?Denies: CAD, PVD, CVA ? ? ?PAST HISTORY: ?Past Medical History:  ?Past Medical History:  ?Diagnosis Date  ? ADHD (attention deficit hyperactivity disorder)   ? Asthma   ? Childhood  ? Diabetic retinopathy ([place])   ? Elevated blood pressure reading without diagnosis of hypertension   ? Endometrial mass   ? History of 2019 novel coronavirus disease (COVID-19) 03/09/2020  ? positive result in epic,  per pt mild to moderate symptoms that resolved  ? History of asthma   ? child  ? History of gonorrhea 2016  ? Hypertensive retinopathy   ? Insulin dependent type 2 diabetes mellitus ([place])   ? followd by pcp---- (11-16-2020 per pt checks blood sugar at home 4-5 times daily,  fasting sugar--- 70--95)  ? 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 Questionable>being treated as such  Treatment course 12/2020: quant<1 6/29: quant <1 6/17: quant <1 6/10: quant 1 6/10: depo provera given 5/22: suction d&c>hydropic villi with polar trophoblastic hyperplasia. See comment>>a molar pregnancy is not  excluded; correlation with beta-HCG is  recommended.  5/21: UVOZD 66,440   ? PCOS (polycystic ovarian syndrome)   ? Retinopathy of both eyes   ? followed by dr Coralyn Pear---  right eye proliferative w/ macular edema;  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  ? ?Past Surgical History:  ?Past Surgical History:  ?Procedure Laterality Date  ? DILATATION & CURETTAGE/HYSTEROSCOPY WITH MYOSURE N/A 11/21/2020  ? Procedure: [place];  Surgeon: Aletha Halim, MD;  Location: [facility];  Service: Gynecology;  Laterality: N/A;  ? INJECTION OF SILICONE OIL Right 3/47/4259   ? Procedure: INJECTION OF SILICONE OIL;  Surgeon: Bernarda Caffey, MD;  Location: [facility];  Service: Ophthalmology;  Laterality: Right;  ? KNEE ARTHROSCOPY W/ ACL RECONSTRUCTION Left 2011  ? MEMBRANE PEEL Right 02/07/2021  ? Procedure: MEMBRANE PEEL;  Surgeon: Bernarda Caffey, MD;  Location: [facility];  Service: Ophthalmology;  Laterality: Right;  ? PARS PLANA VITRECTOMY Right 02/07/2021  ? Procedure: TWENTY-FIVE  GAUGE PARS PLANA VITRECTOMY;  Surgeon: Bernarda Caffey, MD;  Location: [facility];  Service: Ophthalmology;  Laterality: Right;  ? PERFLUORONE INJECTION Right 02/07/2021  ? Procedure: PERFLUORON INJECTION;  Surgeon: Bernarda Caffey, MD;  Location: [facility];  Service: Ophthalmology;  Laterality: Right;  ? PHOTOCOAGULATION WITH LASER Right 02/07/2021  ? Procedure: PHOTOCOAGULATION WITH LASER;  Surgeon: Bernarda Caffey, MD;  Location: [facility];  Service: Ophthalmology;  Laterality: Right;  ? TYMPANOSTOMY TUBE PLACEMENT Bilateral   ? child  ?  ?Social History:  reports that she has been smoking cigarettes. She has a 1.50 pack-year smoking history. She has never used smokeless tobacco. She reports current alcohol use. She reports that she does not currently use drugs after having used the following drugs: Marijuana. ?Family History:  ?Family History  ?Problem Relation Age of Onset  ? Diabetes Mother   ? Vision loss Mother   ? ADD / ADHD Brother   ? Allergies Brother   ? Asthma Brother   ? Vision loss Brother   ? Cancer Paternal Grandfather   ?     Colon Cancer  ? Heart disease Father 42  ?     died from MI at age 31  ? ? ? ?HOME MEDICATIONS: ?Allergies as of 08/08/2021   ? ?   Reactions  ? Amoxicillin Hives  ? Augmentin [amoxicillin-pot Clavulanate] Hives  ? Penicillins Hives  ? Has patient had a PCN reaction causing immediate rash, facial/tongue/throat swelling, SOB or lightheadedness with hypotension: No ?Has patient had a PCN reaction causing severe rash involving mucus membranes or skin necrosis: No ?Has patient had a PCN reaction that  required hospitalization No ?Has patient had a PCN reaction occurring within the last 10 years: No ?If all of the above answers are "NO", then may proceed with Cephalosporin use.  ? Shellfish Allergy Swelling, Other (See Comments)  ? Itching lips  ? Suprax [cefixime]   ? Pt unsure reaction, may have been childhood reaction  ? Latex Rash  ? ?  ? ?  ?Medication List  ?  ? ?  ? Accurate as of August 08, 2021 11:27 AM. If you have any questions, ask your nurse or doctor.  ?  ?  ? ?  ? ?Accu-Chek Guide test strip ?Generic drug: glucose blood ?To check blood sugars 4 times a day. Fasting, and 2 hours after Breakfast, Lunch and Dinner ?  ?Accu-Chek Softclix Lancets lancets ?To check blood sugars 4 times a day. Fasting and 2 hours  after breakfast, lunch and dinner. ?  ?Blood Pressure Kit Devi ?1 Device by Does not apply route once a week. Please take blood pressure once a week and record in Babyscripts. ?  ?Dexcom G6 Sensor Misc ?1 Device by Does not apply route as directed. ?  ?Dexcom G6 Transmitter Misc ?1 Device by Does not apply route as directed. ?  ?empagliflozin 10 MG Tabs tablet ?Commonly known as: Jardiance ?Take 1 tablet (10 mg total) by mouth daily before breakfast. ?  ?Gojji Weight Scale Misc ?1 Device by Does not apply route once a week. Please take weight and record in Babyscripts once a week. ?  ?ibuprofen 800 MG tablet ?Commonly known as: ADVIL ?Take 800 mg by mouth 3 (three) times daily. ?  ?insulin aspart 100 UNIT/ML FlexPen ?Commonly known as: NOVOLOG ?Max daily 50 units ?  ?Insulin Pen Needle 31G X 5 MM Misc ?1 Device by Does not apply route in the morning, at noon, in the evening, and at bedtime. ?  ?Lantus SoloStar 100 UNIT/ML Solostar Pen ?Generic drug: insulin glargine ?Inject 35 Units into the skin daily. ?  ? ?  ? ? ? ?ALLERGIES: ?Allergies  ?Allergen Reactions  ? Amoxicillin Hives  ? Augmentin [Amoxicillin-Pot Clavulanate] Hives  ? Penicillins Hives  ?  Has patient had a PCN reaction causing  immediate rash, facial/tongue/throat swelling, SOB or lightheadedness with hypotension: No ?Has patient had a PCN reaction causing severe rash involving mucus membranes or skin necrosis: No ?Has patient had

## 2021-08-08 NOTE — Patient Instructions (Signed)
?-   Increase  Jardiance 25 mg daily  ?-Increase  Lantus 42 units daily  ?-Increase  Novolog 14 units with a meal  ? ?- Novolog correctional insulin: ADD extra units on insulin to your meal-time Novolog dose if your blood sugars are higher than 160. Use the scale below to help guide you:  ? ?Blood sugar before meal Number of units to inject  ?Less than 160 0 unit  ?161 -  190 1 units  ?191 -  220 2 units  ?221 -  250 3 units  ?251 -  280 4 units  ?281 -  310 5 units  ?311 -  340 6 units  ?341 -  370 7 units  ?371 -  400 8 units  ? ?HOW TO TREAT LOW BLOOD SUGARS (Blood sugar LESS THAN 70 MG/DL) ?Please follow the RULE OF 15 for the treatment of hypoglycemia treatment (when your (blood sugars are less than 70 mg/dL)  ? ?STEP 1: Take 15 grams of carbohydrates when your blood sugar is low, which includes:  ?3-4 GLUCOSE TABS  OR ?3-4 OZ OF JUICE OR REGULAR SODA OR ?ONE TUBE OF GLUCOSE GEL   ? ?STEP 2: RECHECK blood sugar in 15 MINUTES ?STEP 3: If your blood sugar is still low at the 15 minute recheck --> then, go back to STEP 1 and treat AGAIN with another 15 grams of carbohydrates. ? ?

## 2021-10-15 ENCOUNTER — Other Ambulatory Visit (HOSPITAL_COMMUNITY): Payer: Self-pay

## 2021-10-15 ENCOUNTER — Telehealth: Payer: Self-pay | Admitting: Pharmacy Technician

## 2021-10-15 NOTE — Telephone Encounter (Signed)
Patient Advocate Encounter   Received notification from CoverMyMeds that prior authorization for [employer] is required by his/her insurance [employer] [facility].  Per Test Claim: no PA needed. $0  Key [id number]- archived

## 2021-10-22 LAB — HM DIABETES EYE EXAM

## 2021-10-25 ENCOUNTER — Other Ambulatory Visit (HOSPITAL_COMMUNITY): Payer: Self-pay

## 2021-10-25 NOTE — Telephone Encounter (Signed)
Correction- per PA letter on file, Dexcom transmitter PA expires 11/04/21

## 2021-10-25 NOTE — Telephone Encounter (Addendum)
Patient Advocate Encounter   Received notification from CoverMyMeds that prior authorization for Dexcom Transmitter is due for renewal.     PA submitted on 10/25/21 Jone Baseman PA Case ID: [id number] Status is pending    [facility] will continue to follow:  Patient Advocate Fax:  [phone]

## 2021-11-07 NOTE — Telephone Encounter (Signed)
Patient Advocate Encounter  Received notification from [facility] that the request for prior authorization for dexcom Transmitter has been denied due to No documentation of improved glycemic control or use of external insulin pump.     This determination is currently being reviewed and possibly appealed.    This encounter will continue to be updated until final determination.    Specialty Pharmacy Patient Advocate Fax:  [phone]

## 2021-11-08 NOTE — Telephone Encounter (Addendum)
Resubmitted PA with most up to date chart note. Awaiting response.  (KeyPollyann Savoy) - [id number]

## 2021-11-12 ENCOUNTER — Other Ambulatory Visit (HOSPITAL_COMMUNITY): Payer: Self-pay

## 2021-11-12 NOTE — Telephone Encounter (Signed)
Patient Advocate Encounter  Prior Authorization for [employer] has been approved.    PA# PA Case ID: [id number] Effective dates: 11/08/21 through 06//15/24  Per Test Claim Patients co-pay is $0.

## 2021-12-02 IMAGING — US US OB < 14 WEEKS - US OB TV
1 series · 15 of 28 positions shown · non-contrast
Comparison: 11/24/2008

CLINICAL DATA: Pelvic pain

EXAM:
OBSTETRIC <14 WK US AND TRANSVAGINAL OB US
TECHNIQUE: Both transabdominal and transvaginal ultrasound examinations were
performed for complete evaluation of the gestation as well as the
maternal uterus, adnexal regions, and pelvic cul-de-sac.
Transvaginal technique was performed to assess early pregnancy.

[Series 1: us ob < 14 weeks - us ob tv · 70 acquisitions, 15 frames shown]
[im 1/70]
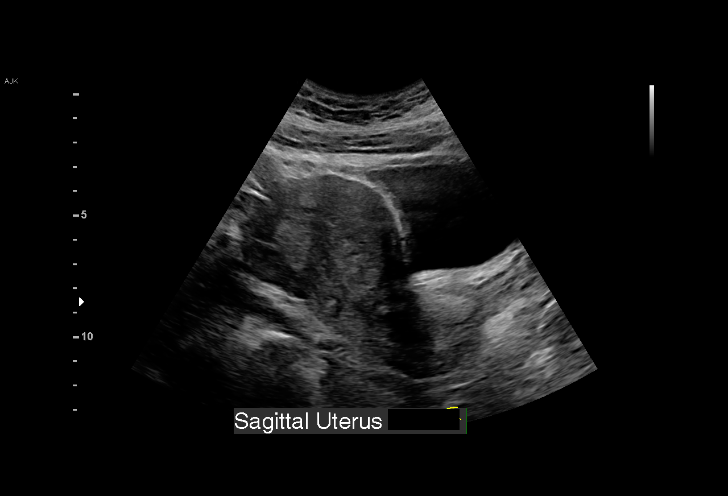
[im 6/70]
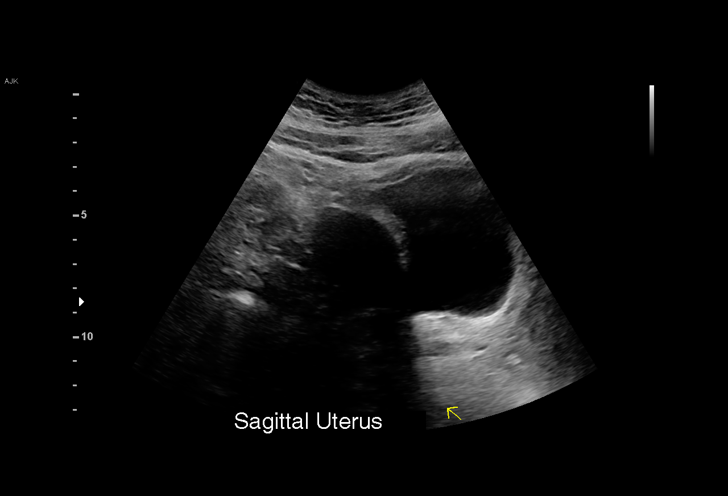
[im 11/70]
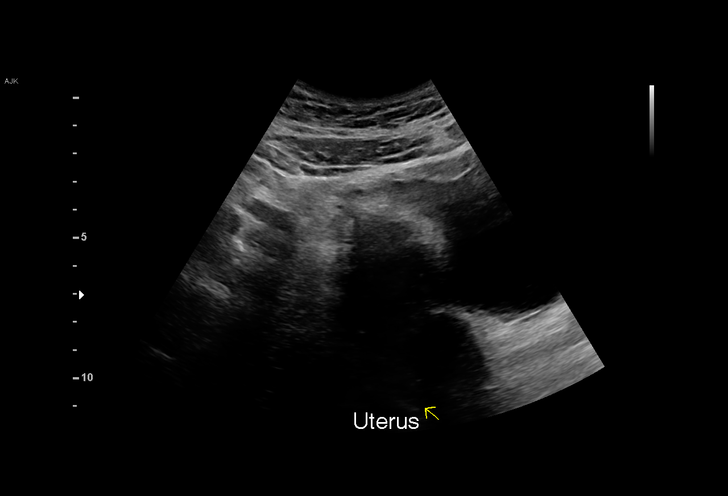
[im 16/70]
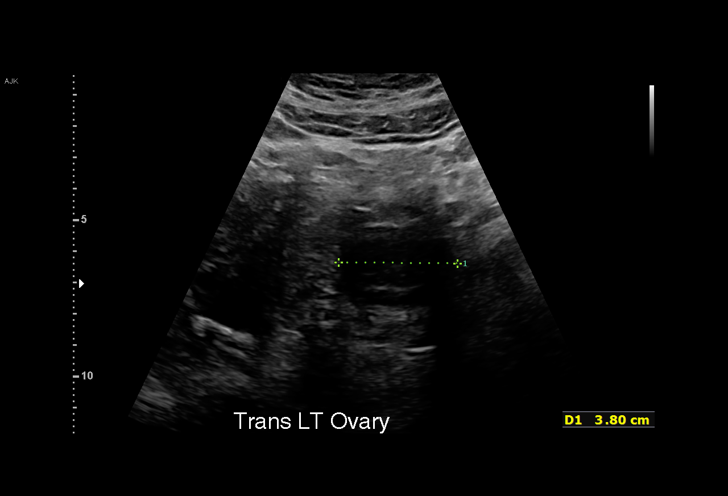
[im 21/70]
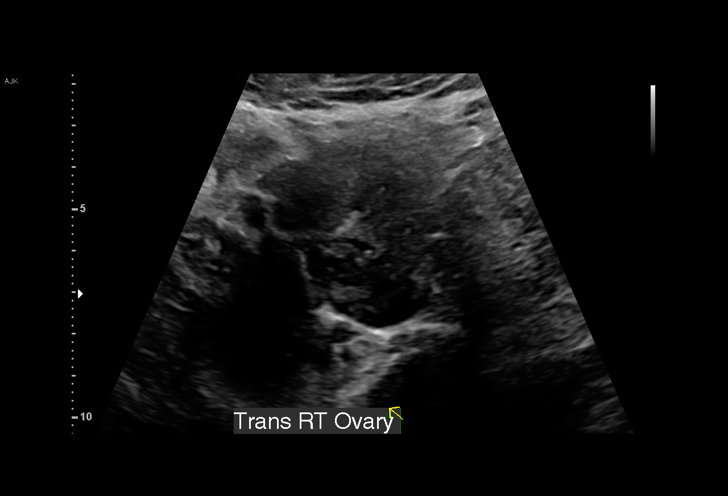
[im 26/70]
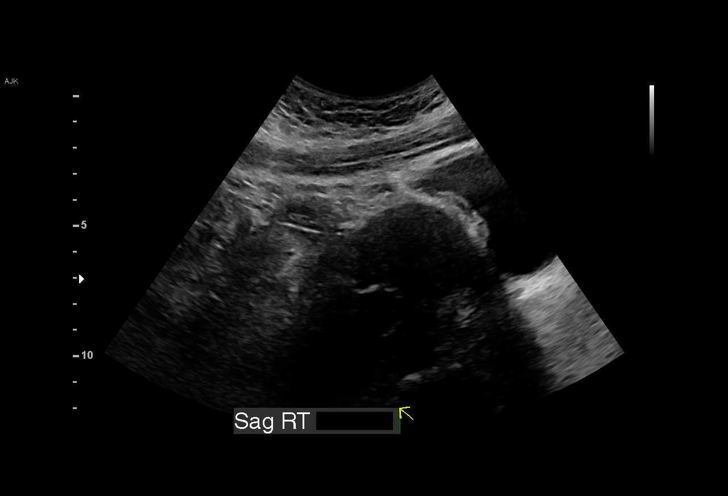
[im 31/70]
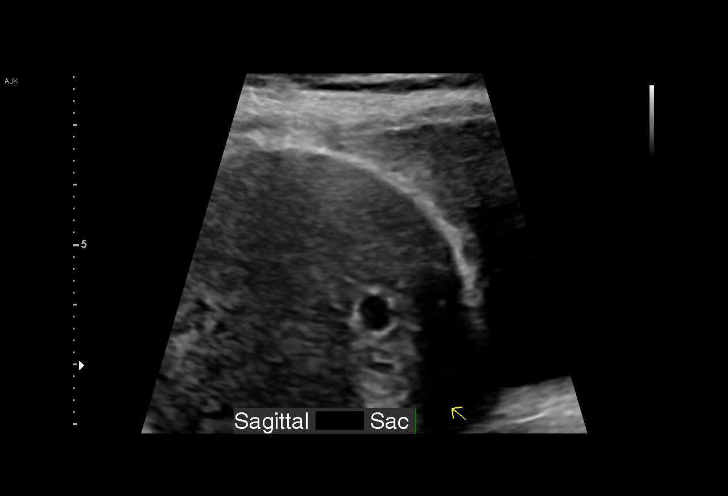
[im 36/70]
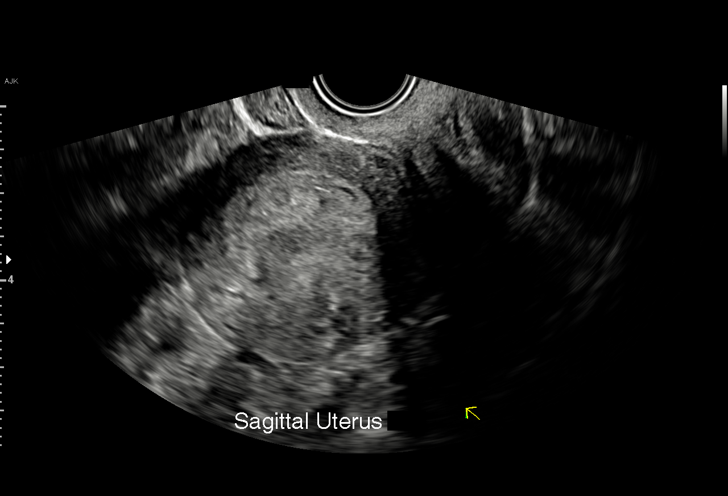
[im 39/70]
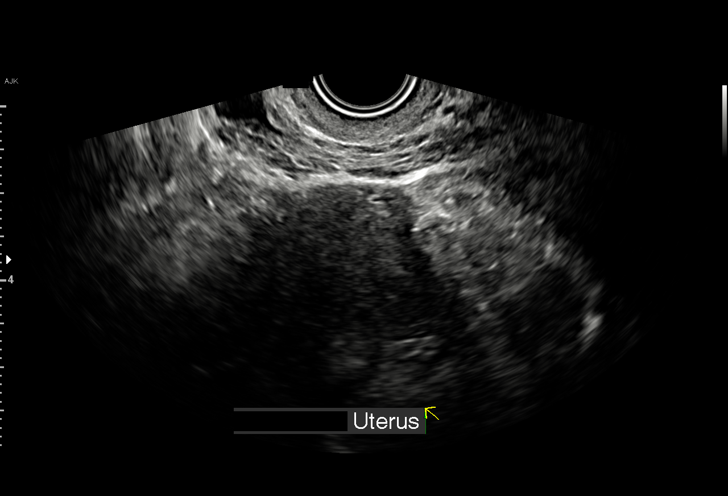
[im 44/70]
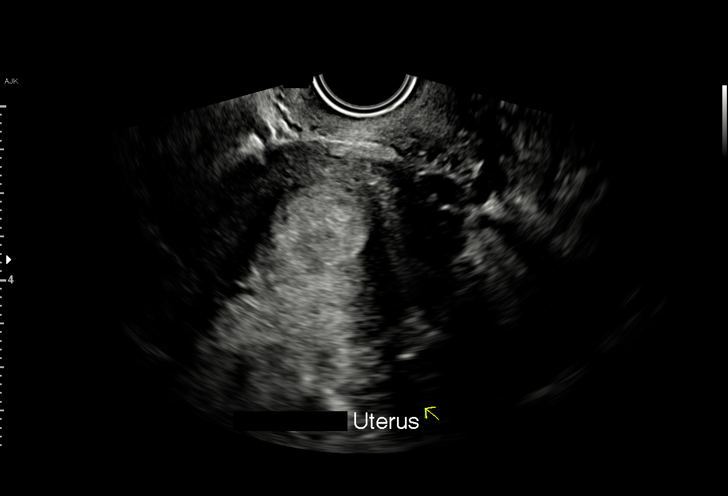
[im 49/70]
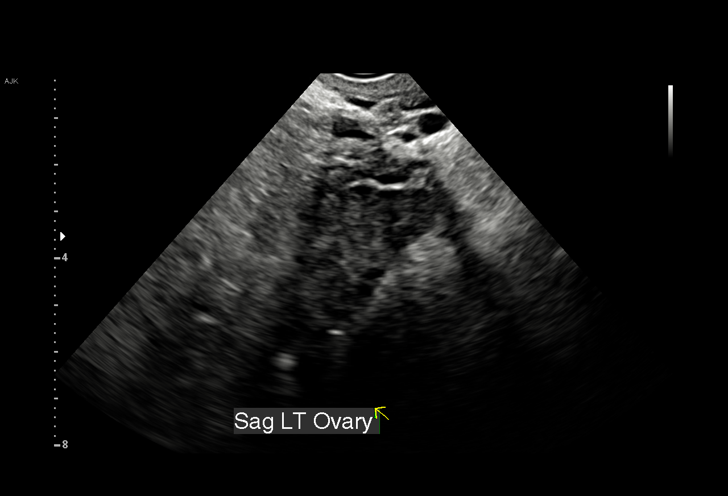
[im 54/70]
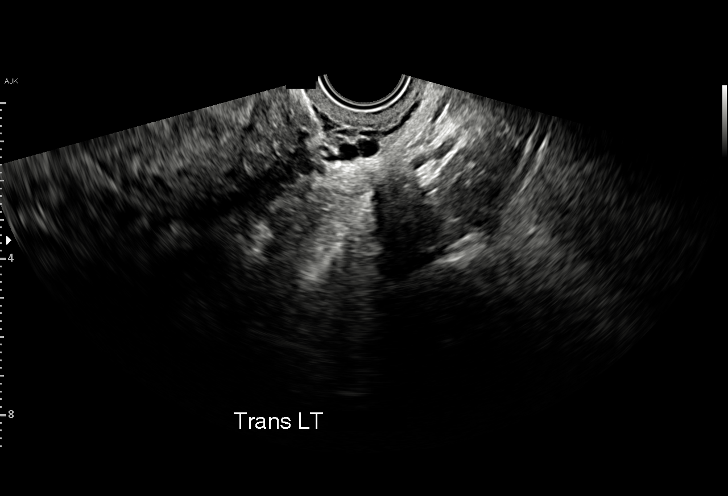
[im 59/70]
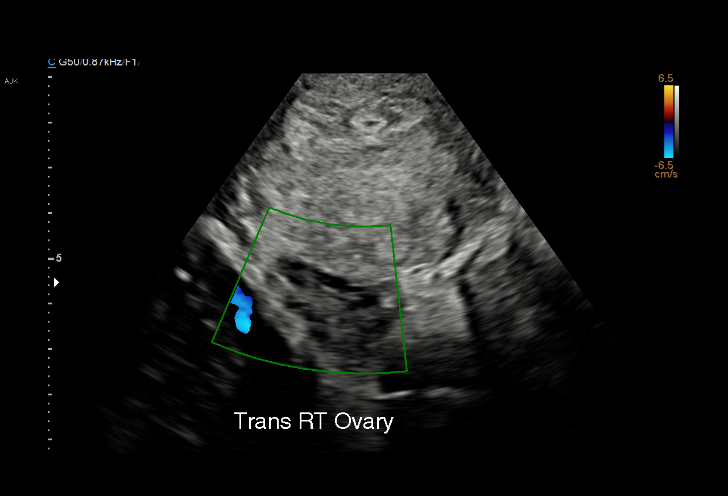
[im 64/70]
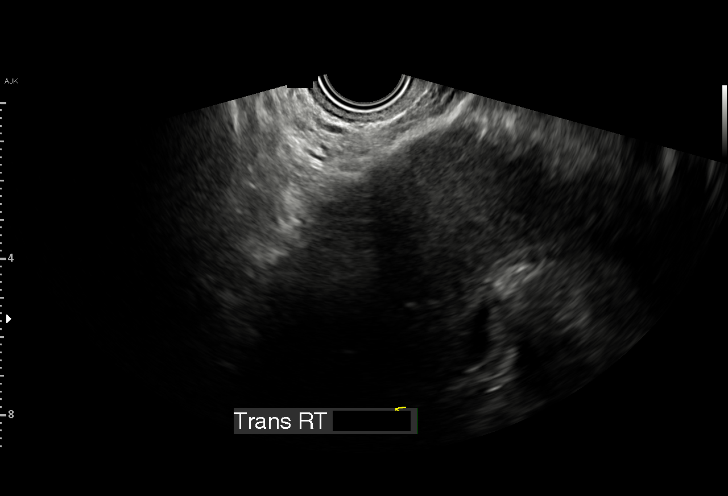
[im 70/70]
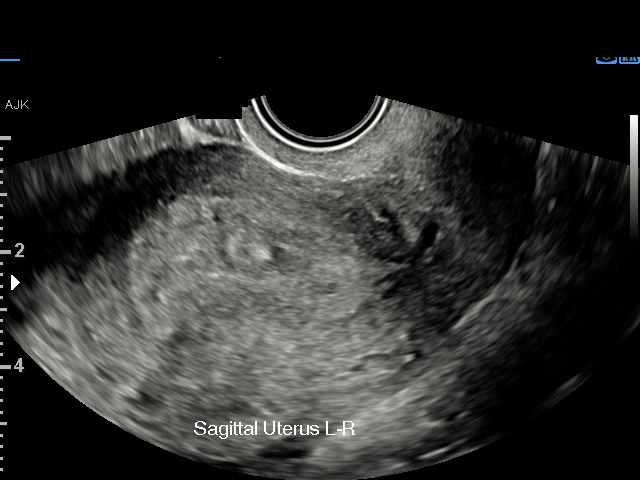

[15 of 28 positions shown; findings below may reference images not displayed]

FINDINGS: Intrauterine gestational sac: Single

Yolk sac:  Visualized.

Embryo:  Not Visualized.

Cardiac Activity: Not Visualized.

MSD: 5.6 mm   5 w   2 d

Subchorionic hemorrhage:  None visualized.

Maternal uterus/adnexae: Unremarkable appearance of the bilateral
ovaries and adnexal regions. No free fluid within the pelvis.
IMPRESSION: Early intrauterine gestational sac containing yolk sac. No fetal
pole or cardiac activity is evident at this time. Recommend close
clinical follow-up, short interval repeat exam, and serial beta HCG
levels.

## 2021-12-11 ENCOUNTER — Ambulatory Visit (INDEPENDENT_AMBULATORY_CARE_PROVIDER_SITE_OTHER): Payer: Medicaid Other | Admitting: Internal Medicine

## 2021-12-11 ENCOUNTER — Other Ambulatory Visit: Payer: Self-pay | Admitting: Internal Medicine

## 2021-12-11 ENCOUNTER — Encounter: Payer: Self-pay | Admitting: Internal Medicine

## 2021-12-11 ENCOUNTER — Telehealth: Payer: Self-pay | Admitting: Internal Medicine

## 2021-12-11 ENCOUNTER — Other Ambulatory Visit: Payer: Self-pay | Admitting: Family

## 2021-12-11 VITALS — BP 126/82 | HR 96 | Ht 65.0 in | Wt 242.0 lb

## 2021-12-11 DIAGNOSIS — E113513 Type 2 diabetes mellitus with proliferative diabetic retinopathy with macular edema, bilateral: Secondary | ICD-10-CM

## 2021-12-11 DIAGNOSIS — E1165 Type 2 diabetes mellitus with hyperglycemia: Secondary | ICD-10-CM

## 2021-12-11 DIAGNOSIS — Z794 Long term (current) use of insulin: Secondary | ICD-10-CM | POA: Diagnosis not present

## 2021-12-11 LAB — POCT GLYCOSYLATED HEMOGLOBIN (HGB A1C): Hemoglobin A1C: 8.2 % — AB (ref 4.0–5.6)

## 2021-12-11 MED ORDER — OMNIPOD 5 DEXG7G6 PODS GEN 5 MISC: 1.0000 | 3 refills | Status: DC

## 2021-12-11 MED ORDER — OMNIPOD 5 DEXG7G6 INTRO GEN 5 KIT: 1.0000 | PACK | 0 refills | Status: DC

## 2021-12-11 MED ORDER — DEXCOM G6 SENSOR MISC: 1.0000 | 3 refills | Status: DC

## 2021-12-11 MED ORDER — DEXCOM G6 TRANSMITTER MISC: 1.0000 | 3 refills | Status: DC

## 2021-12-11 NOTE — Patient Instructions (Signed)
-   Continue  Jardiance 25 mg daily  - Continue  Lantus 42 units daily  - Change Novolog 14 units with  Breakfast and Lunch and 18 units with Supper  - Novolog correctional insulin: ADD extra units on insulin to your meal-time Novolog dose if your blood sugars are higher than 160. Use the scale below to help guide you:   Blood sugar before meal Number of units to inject  Less than 160 0 unit  161 -  190 1 units  191 -  220 2 units  221 -  250 3 units  251 -  280 4 units  281 -  310 5 units  311 -  340 6 units  341 -  370 7 units  371 -  400 8 units   HOW TO TREAT LOW BLOOD SUGARS (Blood sugar LESS THAN 70 MG/DL) Please follow the RULE OF 15 for the treatment of hypoglycemia treatment (when your (blood sugars are less than 70 mg/dL)   STEP 1: Take 15 grams of carbohydrates when your blood sugar is low, which includes:  3-4 GLUCOSE TABS  OR 3-4 OZ OF JUICE OR REGULAR SODA OR ONE TUBE OF GLUCOSE GEL    STEP 2: RECHECK blood sugar in 15 MINUTES STEP 3: If your blood sugar is still low at the 15 minute recheck --> then, go back to STEP 1 and treat AGAIN with another 15 grams of carbohydrates.

## 2021-12-11 NOTE — Telephone Encounter (Signed)
Patient indicated that she needs new PA for a continuation on CGM    Can you please work on that?   Thanks

## 2021-12-11 NOTE — Progress Notes (Signed)
Name: Mercedes Valdez  MRN/ DOB: [id number], [birth year]   Age/ Sex: 26 y.o., female    PCP: Camillia Herter, NP   Reason for Endocrinology Evaluation: Type 2 Diabetes Mellitus     Date of Initial Endocrinology Visit: 05/08/2021    PATIENT IDENTIFIER: Mercedes Valdez is a 26 y.o. female with a past medical history of T2DM, HTN. The patient presented for initial endocrinology clinic visit on 05/08/2021  for consultative assistance with her diabetes management.    HPI: Mercedes Valdez was    Diagnosed with DM at age 73 Prior Medications tried/Intolerance: Glipizide, Trulicity - cost prohibitive               Hemoglobin A1c has ranged from 13.2% in 08/2020, peaking at 14.8% in 02/2021.   She had a miscarriage in 09/2020 No contraception at this time and not sexually active  LMP last week   On her initial visit A1c 11.9%, adjusted MDI, stopped Glimepiride and started Jardiance.    SUBJECTIVE:   During the last visit (08/08/2021): A1c 10.9% we increased Jardiance and increased MDI regimen      Today (12/11/21): Mercedes Valdez is here for a follow up on diabetes management. She  checks her blood sugars multiple  times daily, through CGM. The patient has not  had hypoglycemic episodes since the last clinic visit.    She denies nausea, vomiting or diarrhea  She continues with ophthalmology monthly injections      HOME DIABETES REGIMEN: Jardiance 25 mg daily  NovoLog 14 units TIDQAC Lantus 42 units daily  Correction factor: NovoLog (BG -130/30)     Statin: no ACE-I/ARB: no Prior Diabetic Education:  yes    CONTINUOUS GLUCOSE MONITORING RECORD INTERPRETATION    Dates of Recording: 6/15-6/28/2023  Sensor description:dexcom   Results statistics:   CGM use % of time 93  Average and SD 176/58  Time in range 54  %  % Time Above 180 36  % Time above 250 9  % Time Below target <1      Glycemic patterns summary: Hyperglycemia noted over night , trends down  during the day   Hyperglycemic episodes  postprandial   Hypoglycemic episodes occurred n/a  Overnight periods: high       DIABETIC COMPLICATIONS: Microvascular complications:  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 Denies: CKD Last eye exam: Completed 11/20/2021  Macrovascular complications:   Denies: CAD, PVD, CVA   PAST HISTORY: Past Medical History:  Past Medical History:  Diagnosis Date   ADHD (attention deficit hyperactivity disorder)    Asthma    Childhood   Diabetic retinopathy ([place])    Elevated blood pressure reading without diagnosis of hypertension    Endometrial mass    History of 2019 novel coronavirus disease (COVID-19) 03/09/2020   positive result in epic,  per pt mild to moderate symptoms that resolved   History of asthma    child   History of gonorrhea 2016   Hypertensive retinopathy    Insulin dependent type 2 diabetes mellitus ([place])    followd by pcp---- (11-16-2020 per pt checks blood sugar at home 4-5 times daily,  fasting sugar--- 70--95)   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 See comment>>a molar pregnancy is not excluded; correlation with beta-HCG is  recommended.  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   Past Surgical History:  Past Surgical History:  Procedure Laterality Date   DILATATION & CURETTAGE/HYSTEROSCOPY WITH MYOSURE N/A 11/21/2020   Procedure: [place];  Surgeon: Aletha Halim, MD;  Location: [facility];  Service: Gynecology;  Laterality: N/A;   INJECTION OF SILICONE OIL Right 6/46/8032   Procedure:  INJECTION OF SILICONE OIL;  Surgeon: Bernarda Caffey, MD;  Location: [facility];  Service: Ophthalmology;  Laterality: Right;   KNEE ARTHROSCOPY W/ ACL RECONSTRUCTION Left 2011   MEMBRANE PEEL Right 02/07/2021   Procedure: MEMBRANE PEEL;  Surgeon: Bernarda Caffey, MD;  Location: [facility];  Service: Ophthalmology;  Laterality: Right;   PARS PLANA VITRECTOMY Right 02/07/2021   Procedure: TWENTY-FIVE  GAUGE PARS PLANA VITRECTOMY;  Surgeon: Bernarda Caffey, MD;  Location: [facility];  Service: Ophthalmology;  Laterality: Right;   PERFLUORONE INJECTION Right 02/07/2021   Procedure: PERFLUORON INJECTION;  Surgeon: Bernarda Caffey, MD;  Location: [facility];  Service: Ophthalmology;  Laterality: Right;   PHOTOCOAGULATION WITH LASER Right 02/07/2021   Procedure: PHOTOCOAGULATION WITH LASER;  Surgeon: Bernarda Caffey, MD;  Location: [facility];  Service: Ophthalmology;  Laterality: Right;   TYMPANOSTOMY TUBE PLACEMENT Bilateral    child    Social History:  reports that she has been smoking cigarettes. She has a 1.50 pack-year smoking history. She has never used smokeless tobacco. She reports current alcohol use. She reports that she does not currently use drugs after having used the following drugs: Marijuana. Family History:  Family History  Problem Relation Age of Onset   Diabetes Mother    Vision loss Mother    ADD / ADHD Brother    Allergies Brother    Asthma Brother    Vision loss Brother    Cancer Paternal Grandfather        Colon Cancer   Heart disease Father 24       died from MI at age 64     HOME MEDICATIONS: Allergies as of 12/11/2021       Reactions   Amoxicillin Hives   Augmentin [amoxicillin-pot Clavulanate] Hives   Penicillins Hives   Has patient had a PCN reaction causing immediate rash, facial/tongue/throat swelling, SOB or lightheadedness with hypotension: No Has patient had a PCN reaction causing severe rash involving mucus membranes or skin necrosis: No Has patient had a PCN reaction that required  hospitalization No Has patient had a PCN reaction occurring within the last 10 years: No If all of the above answers are "NO", then may proceed with Cephalosporin use.   Shellfish Allergy Swelling, Other (See Comments)   Itching lips   Suprax [cefixime]    Pt unsure reaction, may have been childhood reaction   Latex Rash        Medication List        Accurate as of December 11, 2021 11:14 AM. If you have any questions, ask your nurse or doctor.          Accu-Chek Guide test strip Generic drug: glucose blood To check blood sugars 4 times a day. Fasting, and 2 hours after Breakfast, Lunch and Dinner   Accu-Chek Softclix Lancets lancets To check blood sugars 4 times a day. Fasting and 2 hours after breakfast, lunch and dinner.   Blood Pressure Kit Devi 1 Device by  Does not apply route once a week. Please take blood pressure once a week and record in Babyscripts.   Dexcom G6 Sensor Misc 1 Device by Does not apply route as directed.   Dexcom G6 Transmitter Misc 1 Device by Does not apply route as directed.   empagliflozin 25 MG Tabs tablet Commonly known as: Jardiance Take 1 tablet (25 mg total) by mouth daily before breakfast.   Gojji Weight Scale Misc 1 Device by Does not apply route once a week. Please take weight and record in Babyscripts once a week.   ibuprofen 800 MG tablet Commonly known as: ADVIL Take 800 mg by mouth 3 (three) times daily.   insulin aspart 100 UNIT/ML FlexPen Commonly known as: NOVOLOG Max daily 50 units What changed:  how much to take when to take this   Insulin Pen Needle 31G X 5 MM Misc 1 Device by Does not apply route in the morning, at noon, in the evening, and at bedtime.   Lantus SoloStar 100 UNIT/ML Solostar Pen Generic drug: insulin glargine Inject 42 Units into the skin daily.         ALLERGIES: Allergies  Allergen Reactions   Amoxicillin Hives   Augmentin [Amoxicillin-Pot Clavulanate] Hives   Penicillins Hives     Has patient had a PCN reaction causing immediate rash, facial/tongue/throat swelling, SOB or lightheadedness with hypotension: No Has patient had a PCN reaction causing severe rash involving mucus membranes or skin necrosis: No Has patient had a PCN reaction that required hospitalization No Has patient had a PCN reaction occurring within the last 10 years: No If all of the above answers are "NO", then may proceed with Cephalosporin use.   Shellfish Allergy Swelling and Other (See Comments)    Itching lips   Suprax [Cefixime]     Pt unsure reaction, may have been childhood reaction   Latex Rash       OBJECTIVE:   VITAL SIGNS: BP 126/82 (BP Location: Left Arm, Patient Position: Sitting, Cuff Size: Large)   Pulse 96   Ht 5' 5"  (1.651 m)   Wt 242 lb (109.8 kg)   SpO2 98%   BMI 40.27 kg/m    PHYSICAL EXAM:  General: Pt appears well and is in NAD  Neck: General: Supple without adenopathy or carotid bruits. Thyroid: Thyroid size normal.  No goiter or nodules appreciated.   Lungs: Clear with good BS bilat with no rales, rhonchi, or wheezes  Heart: RRR with normal S1 and S2 and no gallops; no murmurs; no rub  Abdomen: Normoactive bowel sounds, soft, nontender, without masses or organomegaly palpable  Extremities:  Lower extremities - No pretibial edema. No lesions.  Skin: Normal texture and temperature to palpation. No rash noted.   Neuro: MS is good with appropriate affect, pt is alert and Ox3   DM Foot Exam 12/11/2021 The skin of the feet is intact without sores or ulcerations. The pedal pulses are 2+ on right and 2+ on left. The sensation is intact to a screening 5.07, 10 gram monofilament bilaterally   DATA REVIEWED:  Lab Results  Component Value Date   HGBA1C 8.2 (A) 12/11/2021   HGBA1C 10.7 (A) 08/08/2021   HGBA1C 11.9 (A) 05/08/2021    Latest Reference Range & Units 01/16/21 09:27  Sodium 135 - 145 mmol/L 138  Potassium 3.5 - 5.1 mmol/L 3.5  Chloride 98 - 111 mmol/L  108  CO2 22 - 32 mmol/L 23  Glucose 70 - 99 mg/dL 152 (H)  Mean Plasma Glucose mg/dL 355.1  BUN 6 - 20 mg/dL 6  Creatinine 0.44 - 1.00 mg/dL 0.48  Calcium 8.9 - 10.3 mg/dL 8.5 (L)  Anion gap 5 - 15  7  GFR, Estimated >60 mL/min >60     ASSESSMENT / PLAN / RECOMMENDATIONS:   1) Type 2 Diabetes Mellitus, poorly controlled, With retinopathic complications - Most recent A1c of 8.2%. Goal A1c <7.0%.      - I have praised the pt on improved glycemic control with A1c  down from 10.7 to 8.2%  - In reviewing CGM, at goal BG's has improved from 37% to 54 %  - I have recommended OMNIPOD which she is interested in , prescription sent to pharmacy  - A referral has been placed for training to our CDE - I am going to increase prandial dose of insulin at night   MEDICATIONS: Continue Jardiance 25 mg daily  Continue Lantus 42 units daily  Change Novolog 14 units with breakfast, 14 units with lunch, and 18 units with supper Correction factor: NovoLog (BG -130/30)  EDUCATION / INSTRUCTIONS: BG monitoring instructions: Patient is instructed to check her blood sugars 3 times a day, before meals . Call [facility] if: BG persistently < 70  I reviewed the Rule of 15 for the treatment of hypoglycemia in detail with the patient. Literature supplied.   2) Diabetic complications:  Eye: Does  have known diabetic retinopathy.  Neuro/ Feet: Does not have known diabetic peripheral neuropathy. Renal: Patient does not have known baseline CKD. She is not on an ACEI/ARB at present.    3) Dyslipidemia :   -TG and LDL are trending down but continue to be above goal.   -No indication for statin therapy at this time    Follow-up in 4 months   Signed electronically by: Mack Guise, MD  [facility]  [facility] [address] [place],  [address] Phone: [phone] FAX: [phone]   CC: Camillia Herter, NP [place] [address] Phone: [phone]  Fax: [phone]    Return to Endocrinology clinic as below: No future appointments.

## 2021-12-12 ENCOUNTER — Other Ambulatory Visit (HOSPITAL_COMMUNITY): Payer: Self-pay

## 2021-12-13 ENCOUNTER — Telehealth: Payer: Self-pay | Admitting: Dietician

## 2021-12-13 NOTE — Telephone Encounter (Signed)
Returned patient call. Patient needs training on the Omnipod 5. Patient was not available and voice mail was not set up.  Will message Vaughan Basta to arrange a training appointment.

## 2021-12-16 ENCOUNTER — Other Ambulatory Visit (HOSPITAL_COMMUNITY): Payer: Self-pay

## 2021-12-24 NOTE — Telephone Encounter (Signed)
Voice mail not set up on home phone or cell phone.  Tried to schedule pump training

## 2021-12-26 IMAGING — US US OB TRANSVAGINAL
1 series · 15 of 24 positions shown · non-contrast
Comparison: October 09, 2020.

CLINICAL DATA: Vaginal bleeding.

EXAM:
TRANSVAGINAL OB ULTRASOUND
TECHNIQUE: Transvaginal ultrasound was performed for complete evaluation of the
gestation as well as the maternal uterus, adnexal regions, and
pelvic cul-de-sac.

[Series 1: us ob transvaginal · 24 acquisitions, 15 frames shown]
[im 1/24]
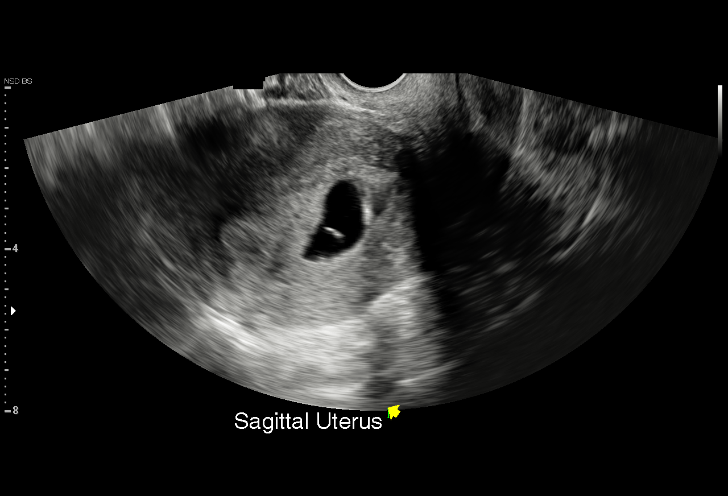
[im 3/24]
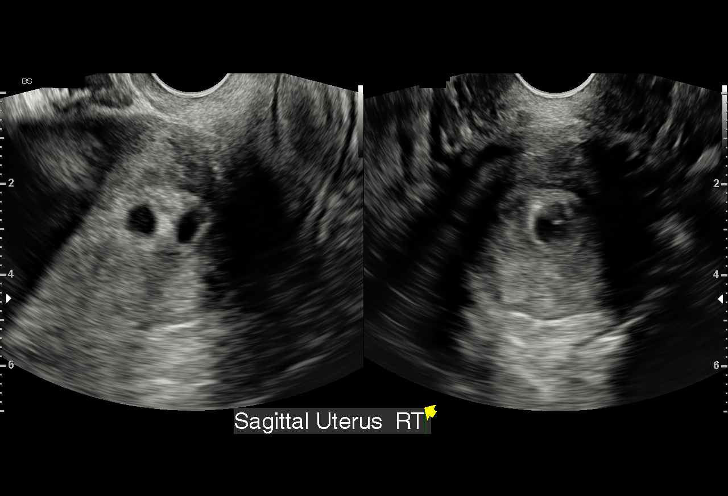
[im 5/24]
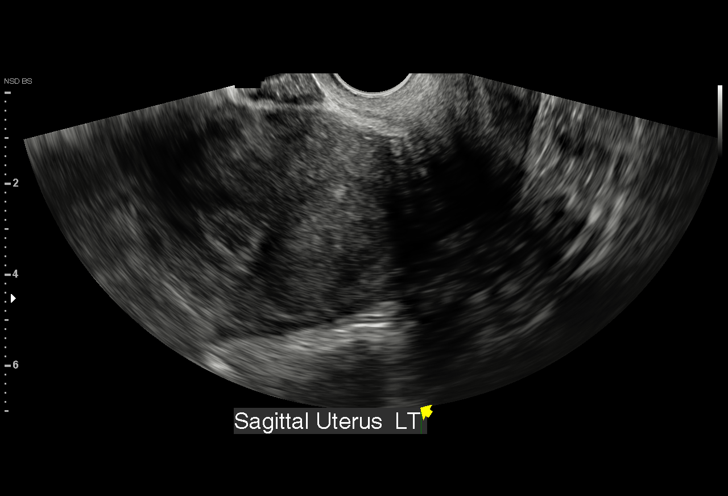
[im 6/24]
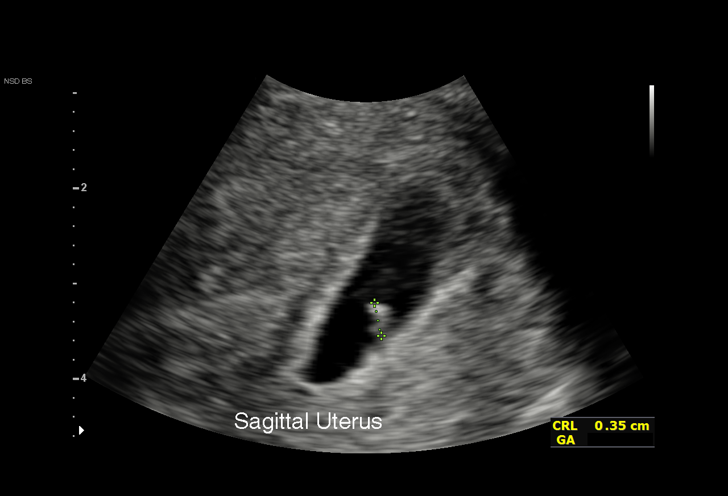
[im 8/24]
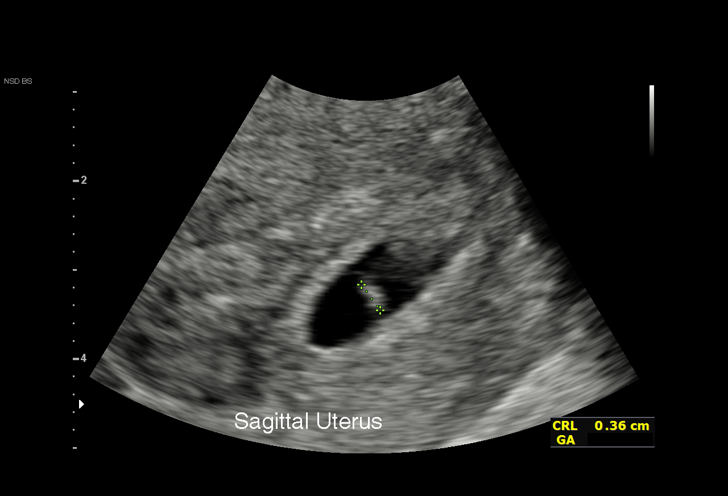
[im 9/24]
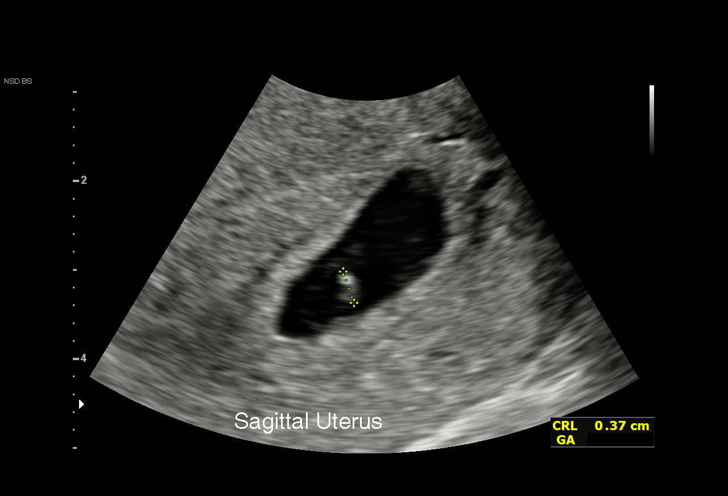
[im 11/24]
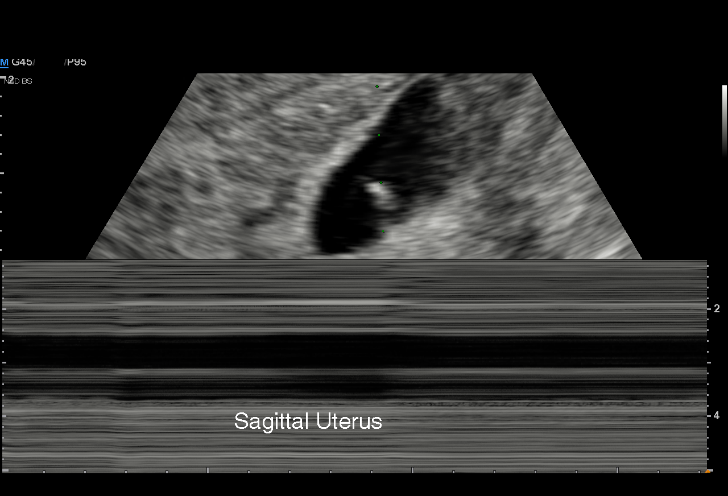
[im 13/24]
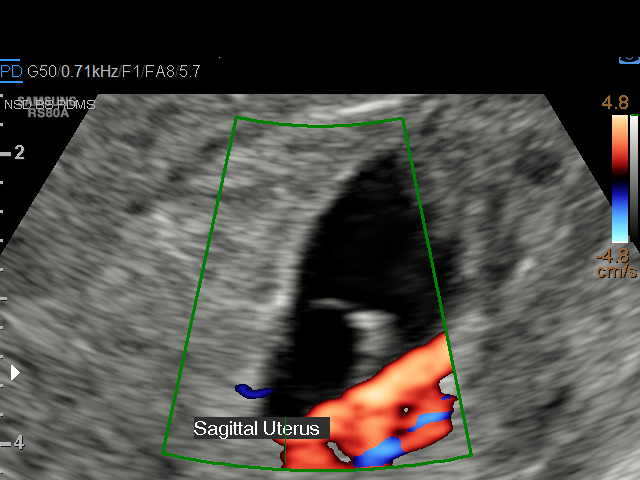
[im 14/24]
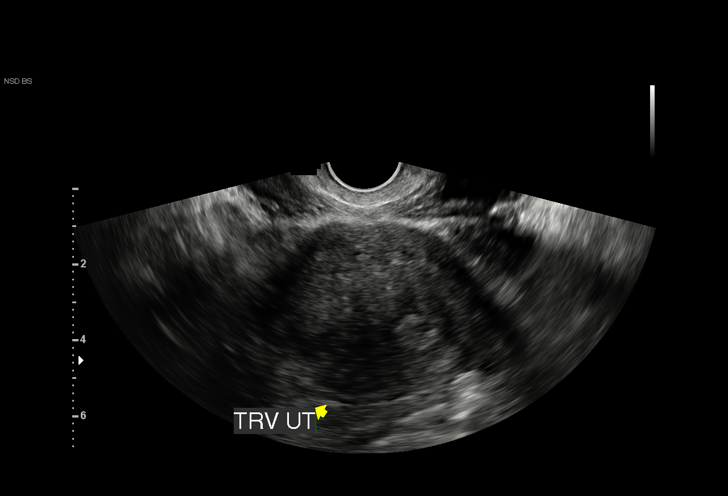
[im 16/24]
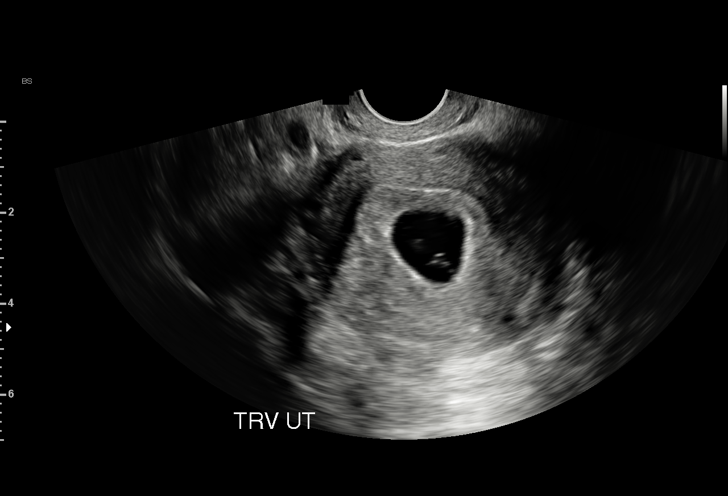
[im 17/24]
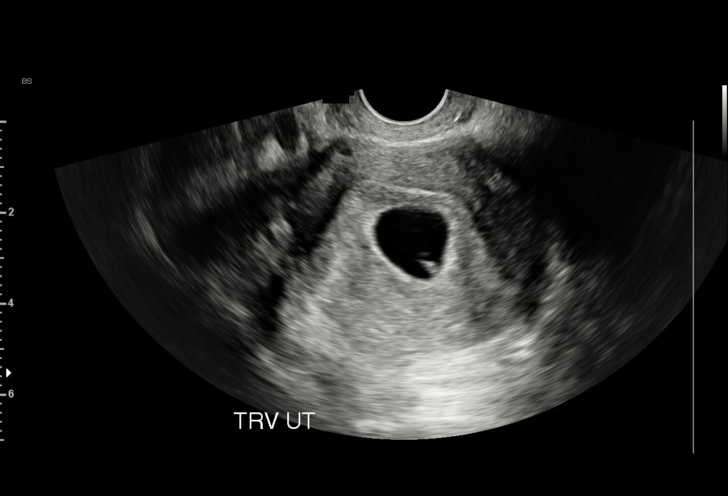
[im 19/24]
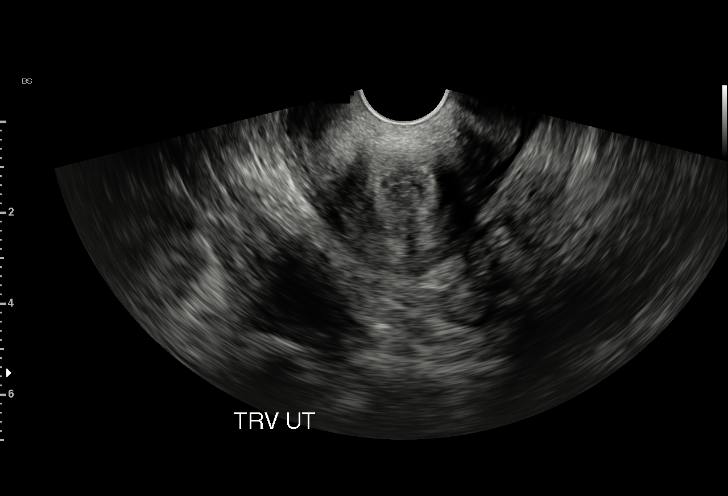
[im 21/24]
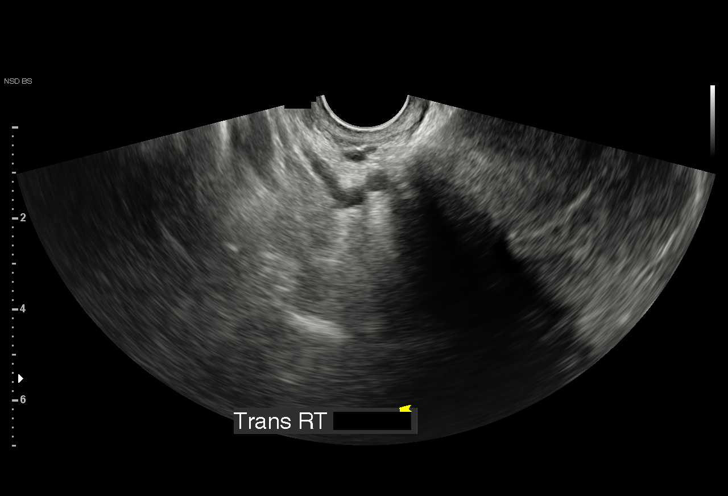
[im 22/24]
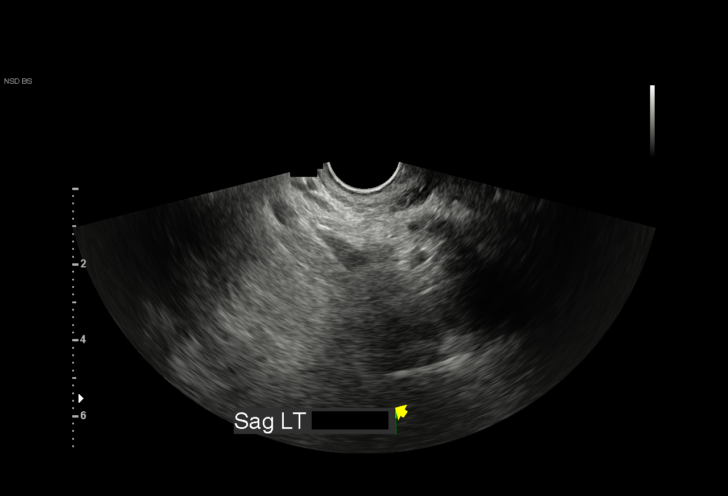
[im 24/24]
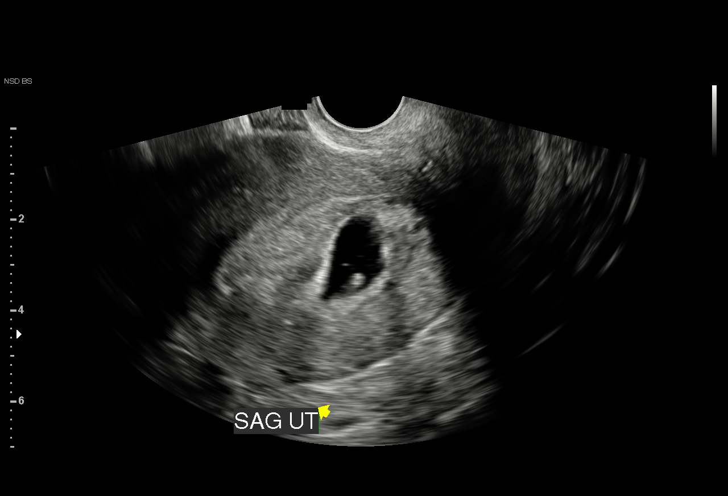

[15 of 24 positions shown; findings below may reference images not displayed]

FINDINGS: Intrauterine gestational sac: Single

Yolk sac:  Not Visualized.

Embryo:  Visualized.

Cardiac Activity: Not Visualized.

CRL:   3.6 mm   6 w 0 d

Subchorionic hemorrhage:  Small subchorionic hemorrhage is noted.

Maternal uterus/adnexae: Ovaries are not visualized. No free fluid
is noted.
IMPRESSION: Probable early intrauterine gestational sac with fetal pole, but no
yolk sac or fetal cardiac activity visualized currently, although
fetal cardiac activity may have been present on prior exam.
Recommend follow-up quantitative B-HCG levels and follow-up US in 14
days to assess viability. This recommendation follows SRU consensus
guidelines: Diagnostic Criteria for Nonviable Pregnancy Early in the
First Trimester. N Engl J Med 2001; [DATE].

## 2021-12-27 IMAGING — US US OB TRANSVAGINAL
1 series · 15 of 28 positions shown · non-contrast
Comparison: 10/13/2020 and earlier.

CLINICAL DATA: 25-year-old female with bleeding in the 1st
trimester of pregnancy. Quantitative beta HCG yesterday [DATE].
Estimated gestational age by LMP 10 weeks and 4 days. Probable early
IUP but no fetal pole or yolk sac on ultrasound yesterday.

EXAM:
TRANSVAGINAL OB ULTRASOUND
TECHNIQUE: Transvaginal ultrasound was performed for complete evaluation of the
gestation as well as the maternal uterus, adnexal regions, and
pelvic cul-de-sac.

[Series 1: us ob transvaginal · 15 of 30 slices shown]
[im 1/30]
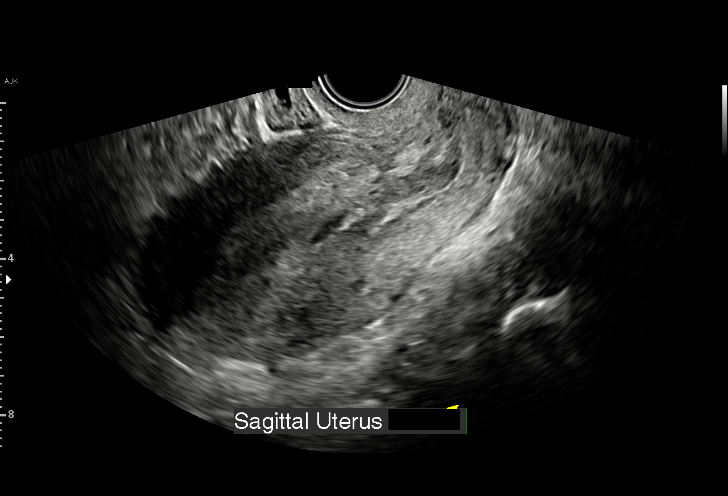
[im 3/30]
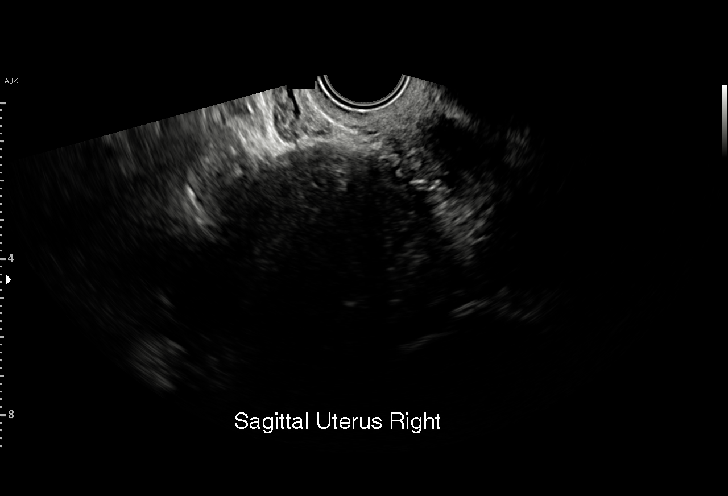
[im 5/30]
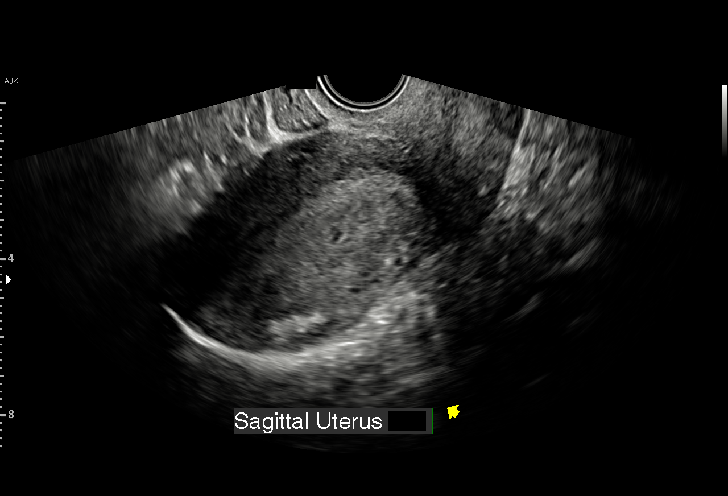
[im 7/30]
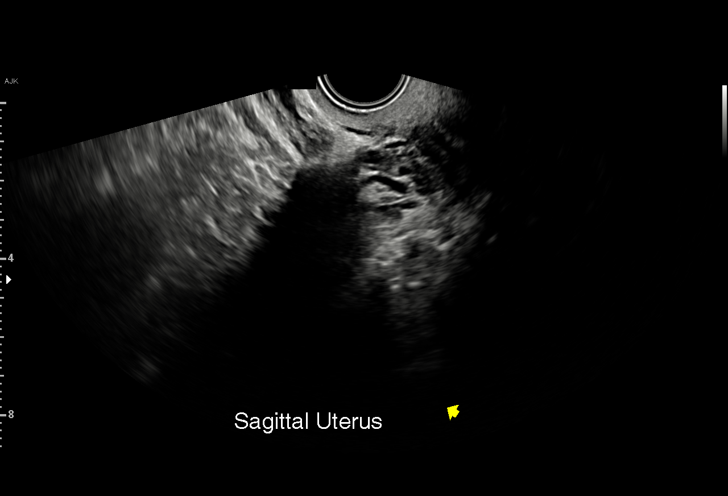
[im 9/30]
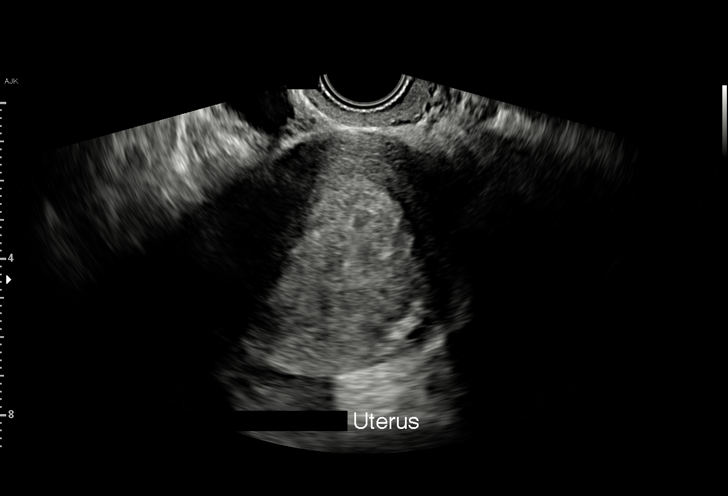
[im 11/30]
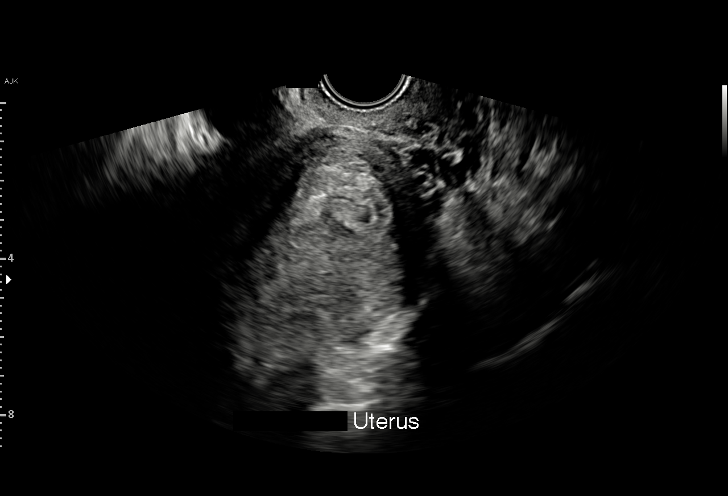
[im 13/30]
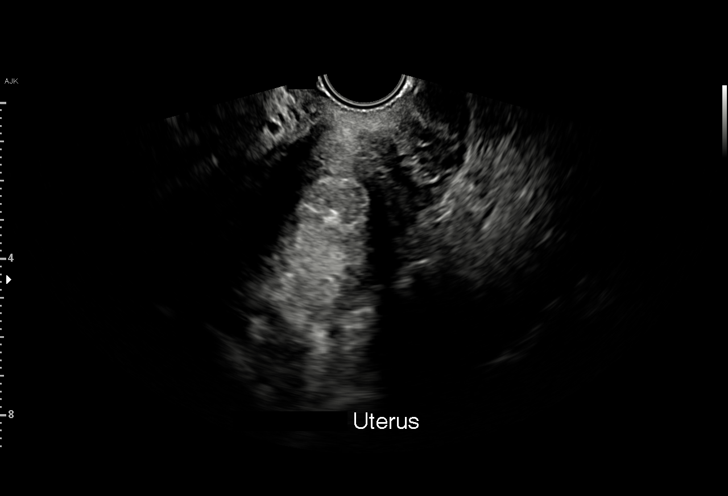
[im 16/30]
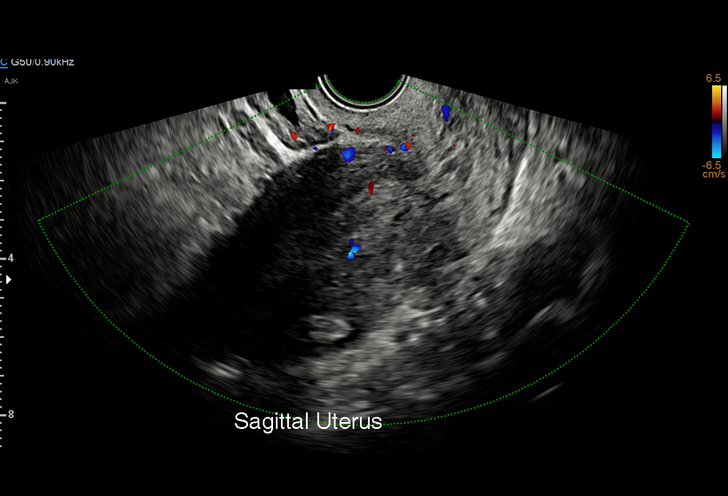
[im 17/30]
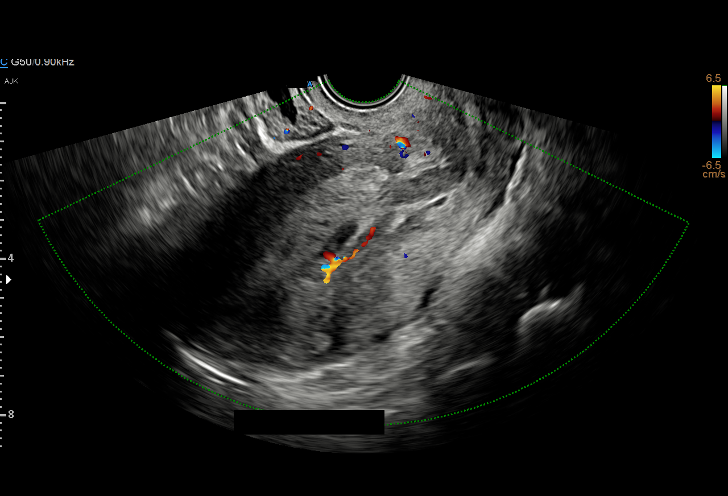
[im 19/30]
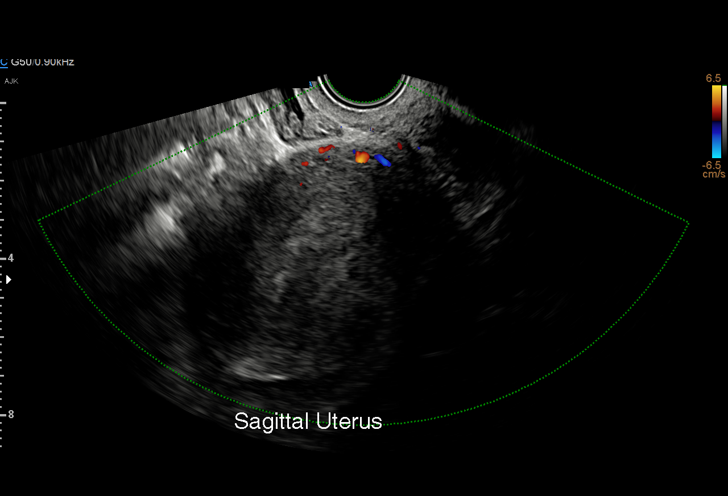
[im 21/30]
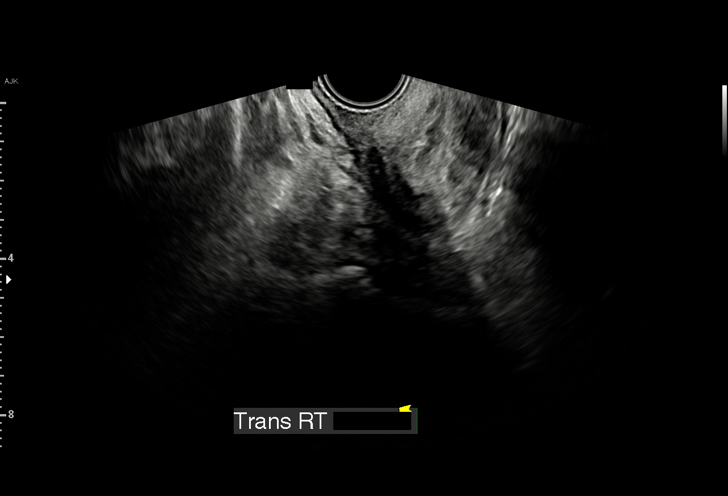
[im 23/30]
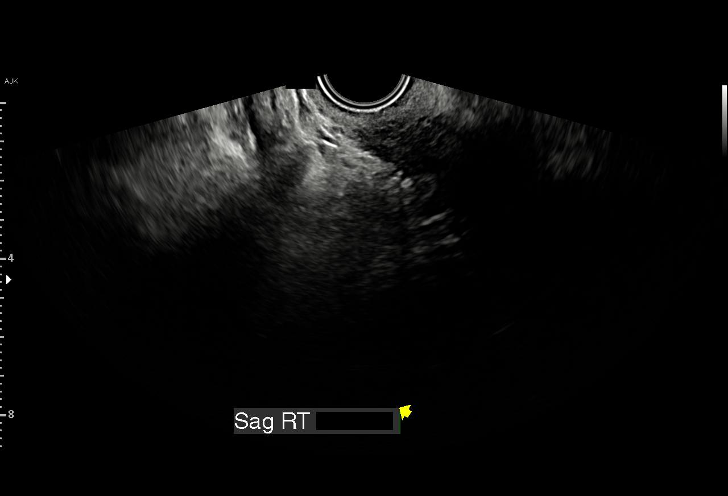
[im 25/30]
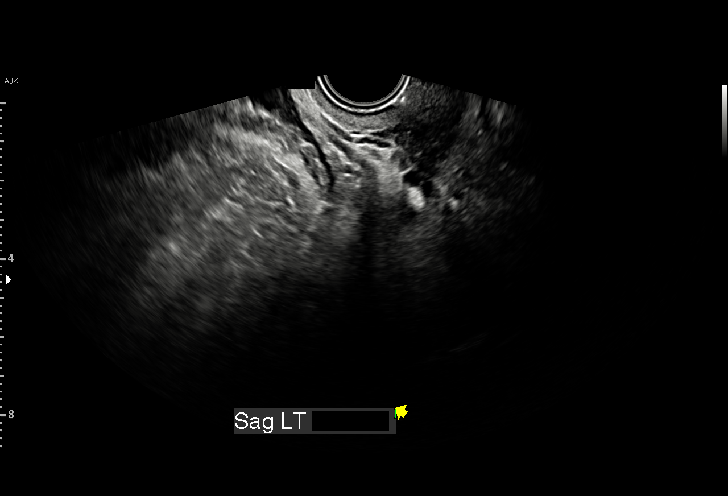
[im 27/30]
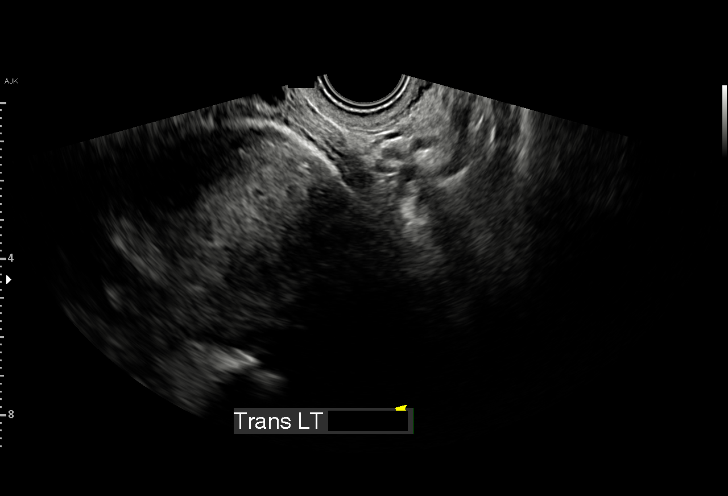
[im 30/30]
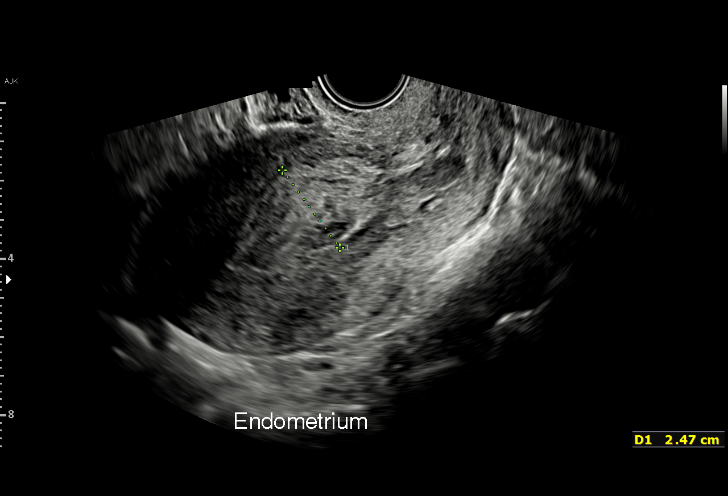

[15 of 28 positions shown; findings below may reference images not displayed]

FINDINGS: Intrauterine gestational sac: No longer identified. Questionable
collapsed, abnormal gestational sac on image 2, 16. The endometrium
is thick and heterogeneous (image 30).

Maternal uterus/adnexae: No free fluid identified. Neither ovary is
delineated.
IMPRESSION: 1. Failed IUP, with loss of the normal intrauterine gestational sac
demonstrated yesterday. Possible collapsed sac in the canal along
with abundant endometrial thickening and heterogeneity.
2. No free fluid.

## 2022-01-06 ENCOUNTER — Encounter (HOSPITAL_COMMUNITY): Payer: Self-pay | Admitting: Emergency Medicine

## 2022-01-06 ENCOUNTER — Ambulatory Visit (HOSPITAL_COMMUNITY)
Admission: EM | Admit: 2022-01-06 | Discharge: 2022-01-06 | Disposition: A | Discharge status: Home / Self Care | Payer: Medicaid Other | Attending: Internal Medicine | Admitting: Internal Medicine

## 2022-01-06 ENCOUNTER — Other Ambulatory Visit: Payer: Self-pay | Admitting: Family

## 2022-01-06 DIAGNOSIS — Z794 Long term (current) use of insulin: Secondary | ICD-10-CM

## 2022-01-06 DIAGNOSIS — R109 Unspecified abdominal pain: Secondary | ICD-10-CM | POA: Diagnosis present

## 2022-01-06 LAB — RPR
RPR Ser Ql: REACTIVE — AB
RPR Titer: 1:64 {titer}

## 2022-01-06 LAB — POCT URINALYSIS DIPSTICK, ED / UC
Bilirubin Urine: NEGATIVE
Glucose, UA: 500 mg/dL — AB
Hgb urine dipstick: NEGATIVE
Ketones, ur: NEGATIVE mg/dL
Leukocytes,Ua: NEGATIVE
Nitrite: NEGATIVE
Protein, ur: NEGATIVE mg/dL
Specific Gravity, Urine: 1.02 (ref 1.005–1.030)
Urobilinogen, UA: 0.2 mg/dL (ref 0.0–1.0)
pH: 5.5 (ref 5.0–8.0)

## 2022-01-06 LAB — POC URINE PREG, ED: Preg Test, Ur: NEGATIVE

## 2022-01-06 LAB — HIV ANTIBODY (ROUTINE TESTING W REFLEX): HIV Screen 4th Generation wRfx: NONREACTIVE

## 2022-01-06 LAB — CBG MONITORING, ED: Glucose-Capillary: 139 mg/dL — ABNORMAL HIGH (ref 70–99)

## 2022-01-06 NOTE — ED Provider Notes (Signed)
[place]    CSN: [id number] Arrival date & time: 01/06/22  0846      History   Chief Complaint Chief Complaint  Patient presents with   Abdominal Pain    HPI Mercedes Valdez is a 26 y.o. female.   Patient presents with intermittent centralized abdominal cramping for 1 month.  Symptoms typically occurring daily without pattern, precipitating event.  Has been using ibuprofen which has been helpful.  Denies abdominal bloating, increased gas production, heartburn or indigestion, nausea, vomiting, diarrhea, urinary symptoms, vaginal symptoms, fever, chills, URI symptoms.  History of diabetes, endometrial mass, PCOS.  Diabetes is well controlled.  Denies dietary changes or recent travel.  Past Medical History:  Diagnosis Date   ADHD (attention deficit hyperactivity disorder)    Asthma    Childhood   Diabetic retinopathy ([place])    Elevated blood pressure reading without diagnosis of hypertension    Endometrial mass    History of 2019 novel coronavirus disease (COVID-19) 03/09/2020   positive result in epic,  per pt mild to moderate symptoms that resolved   History of asthma    child   History of gonorrhea 2016   Hypertensive retinopathy    Insulin dependent type 2 diabetes mellitus ([place])    followd by pcp---- (11-16-2020 per pt checks blood sugar at home 4-5 times daily,  fasting sugar--- 70--95)   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 See comment>>a molar pregnancy is not excluded; correlation with beta-HCG is  recommended.  5/21: NIOEV 03,500    PCOS (polycystic ovarian syndrome)    Retinopathy of both eyes    followed by dr Coralyn Pear---  right eye proliferative w/ macular edema;  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    Patient Active Problem List    Diagnosis Date Noted   Dyslipidemia 05/09/2021   Type 2 diabetes mellitus with both eyes affected by proliferative retinopathy and macular edema, with long-term current use of insulin ([place]) 05/09/2021   History of herpes simplex infection 10/08/2020   Psychosocial stressors 09/26/2014   Insulin dependent type 2 diabetes mellitus ([place]) 09/06/2014   Multiple drug allergies 02/27/2014   Musculoskeletal pain 04/08/2013   Unspecified gastritis and gastroduodenitis without mention of hemorrhage 04/08/2013   Type 2 diabetes mellitus ([place]) 04/08/2013   Irregular menses 04/08/2013   Dysfunctional uterine bleeding 11/09/2012   PCOS (polycystic ovarian syndrome) 09/29/2012   Asthma     Past Surgical History:  Procedure Laterality Date   DILATATION & CURETTAGE/HYSTEROSCOPY WITH MYOSURE N/A 11/21/2020   Procedure: [place];  Surgeon: Aletha Halim, MD;  Location: [place];  Service: Gynecology;  Laterality: N/A;   INJECTION OF SILICONE OIL Right 9/38/1829   Procedure: INJECTION OF SILICONE OIL;  Surgeon: Bernarda Caffey, MD;  Location: [place];  Service: Ophthalmology;  Laterality: Right;   KNEE ARTHROSCOPY W/ ACL RECONSTRUCTION Left 2011   MEMBRANE PEEL Right 02/07/2021   Procedure: MEMBRANE PEEL;  Surgeon: Bernarda Caffey, MD;  Location: [place];  Service: Ophthalmology;  Laterality: Right;   PARS PLANA VITRECTOMY Right 02/07/2021   Procedure: TWENTY-FIVE  GAUGE PARS PLANA VITRECTOMY;  Surgeon: Bernarda Caffey, MD;  Location: [place];  Service: Ophthalmology;  Laterality: Right;   PERFLUORONE INJECTION Right 02/07/2021   Procedure: PERFLUORON INJECTION;  Surgeon: Bernarda Caffey, MD;  Location:  [place] [facility];  Service: Ophthalmology;  Laterality: Right;   PHOTOCOAGULATION WITH LASER Right 02/07/2021   Procedure: PHOTOCOAGULATION WITH LASER;  Surgeon: Bernarda Caffey, MD;  Location: [place];  Service: Ophthalmology;  Laterality: Right;   TYMPANOSTOMY TUBE PLACEMENT  Bilateral    child    OB History     Gravida  1   Para      Term      Preterm      AB  1   Living         SAB  0   IAB      Ectopic      Multiple      Live Births           Obstetric Comments  G1: possible  molar pregnancy at 5wks based on path, SAB          Home Medications    Prior to Admission medications   Medication Sig Start Date End Date Taking? Authorizing Provider  Accu-Chek Softclix Lancets lancets To check blood sugars 4 times a day. Fasting and 2 hours after breakfast, lunch and dinner. 02/25/21   Camillia Herter, NP  Blood Pressure Monitoring (BLOOD PRESSURE KIT) DEVI 1 Device by Does not apply route once a week. Please take blood pressure once a week and record in Babyscripts. 10/08/20   Clarnce Flock, MD  Continuous Blood Gluc Sensor (DEXCOM G6 SENSOR) MISC 1 Device by Does not apply route as directed. 12/11/21   Shamleffer, Melanie Crazier, MD  Continuous Blood Gluc Transmit (DEXCOM G6 TRANSMITTER) MISC 1 Device by Does not apply route as directed. 12/11/21   Shamleffer, Melanie Crazier, MD  empagliflozin (JARDIANCE) 25 MG TABS tablet Take 1 tablet (25 mg total) by mouth daily before breakfast. 08/08/21   Shamleffer, Melanie Crazier, MD  glucose blood (ACCU-CHEK GUIDE) test strip To check blood sugars 4 times a day. Fasting, and 2 hours after Breakfast, Lunch and Dinner 02/25/21   Camillia Herter, NP  ibuprofen (ADVIL) 800 MG tablet Take 800 mg by mouth 3 (three) times daily. 04/22/21   [provider]  insulin aspart (NOVOLOG) 100 UNIT/ML FlexPen Max daily 50 units Patient taking differently: 14 Units 3 (three) times daily with meals. Max daily 50 units 05/08/21   Shamleffer, Melanie Crazier, MD  Insulin Disposable Pump (OMNIPOD 5 G6 INTRO, GEN 5,) KIT 1 Device by Does not apply route every other day. 12/11/21   Shamleffer, Melanie Crazier, MD  Insulin Disposable Pump (OMNIPOD 5 G6 POD, GEN 5,) MISC 1 Device by Does not apply route every  other day. 12/11/21   Shamleffer, Melanie Crazier, MD  insulin glargine (LANTUS SOLOSTAR) 100 UNIT/ML Solostar Pen Inject 42 Units into the skin daily. 08/08/21   Shamleffer, Melanie Crazier, MD  Insulin Pen Needle 31G X 5 MM MISC 1 Device by Does not apply route in the morning, at noon, in the evening, and at bedtime. 05/08/21   Shamleffer, Melanie Crazier, MD  Misc. Devices (GOJJI WEIGHT SCALE) MISC 1 Device by Does not apply route once a week. Please take weight and record in Babyscripts once a week. 10/08/20   Clarnce Flock, MD  atorvastatin (LIPITOR) 20 MG tablet Take 1 tablet (20 mg total) by mouth daily. To lower cholesterol Patient not taking: Reported on 08/29/2019 12/13/18 08/29/19  Antony Blackbird, MD    Family History Family History  Problem Relation Age of Onset   Diabetes Mother    Vision loss Mother  ADD / ADHD Brother    Allergies Brother    Asthma Brother    Vision loss Brother    Cancer Paternal Grandfather        Colon Cancer   Heart disease Father 13       died from MI at age 57    Social History Social History   Tobacco Use   Smoking status: Every Day    Packs/day: 0.25    Years: 6.00    Total pack years: 1.50    Types: Cigarettes   Smokeless tobacco: Never   Tobacco comments:    11-16-2020  per pt 3 cig per day  Vaping Use   Vaping Use: Former   Devices: uses different types  Substance Use Topics   Alcohol use: Yes    Comment: socially   Drug use: Not Currently    Types: Marijuana     Allergies   Amoxicillin, Augmentin [amoxicillin-pot clavulanate], Penicillins, Shellfish allergy, Suprax [cefixime], and Latex   Review of Systems Review of Systems  Constitutional: Negative.   HENT: Negative.    Respiratory: Negative.    Cardiovascular: Negative.   Gastrointestinal:  Positive for abdominal pain. Negative for abdominal distention, anal bleeding, blood in stool, constipation, diarrhea, nausea, rectal pain and vomiting.  Skin: Negative.    Neurological: Negative.      Physical Exam Triage Vital Signs ED Triage Vitals  Enc Vitals Group     BP 01/06/22 0905 115/81     Pulse Rate 01/06/22 0905 89     Resp 01/06/22 0905 18     Temp 01/06/22 0905 98.1 F (36.7 C)     Temp src --      SpO2 01/06/22 0905 98 %     Weight --      Height --      Head Circumference --      Peak Flow --      Pain Score 01/06/22 0903 9     Pain Loc --      Pain Edu? --      Excl. in [place]? --    No data found.  Updated Vital Signs BP 115/81 (BP Location: Left Arm)   Pulse 89   Temp 98.1 F (36.7 C)   Resp 18   LMP 11/16/2021   SpO2 98%   Visual Acuity Right Eye Distance:   Left Eye Distance:   Bilateral Distance:    Right Eye Near:   Left Eye Near:    Bilateral Near:     Physical Exam Constitutional:      Appearance: Normal appearance. She is well-developed.  Eyes:     Extraocular Movements: Extraocular movements intact.  Pulmonary:     Effort: Pulmonary effort is normal.  Abdominal:     General: Abdomen is flat. Bowel sounds are normal.     Palpations: Abdomen is soft.     Tenderness: There is no abdominal tenderness.  Skin:    General: Skin is warm and dry.  Neurological:     General: No focal deficit present.     Mental Status: She is alert and oriented to person, place, and time.  Psychiatric:        Mood and Affect: Mood normal.        Behavior: Behavior normal.      UC Treatments / Results  Labs (all labs ordered are listed, but only abnormal results are displayed) Labs Reviewed  POCT URINALYSIS DIPSTICK, ED / UC - Abnormal; Notable for the  following components:      Result Value   Glucose, UA 500 (*)    All other components within normal limits  CBG MONITORING, ED - Abnormal; Notable for the following components:   Glucose-Capillary 139 (*)    All other components within normal limits  POC URINE PREG, ED    EKG   Radiology No results found.  Procedures Procedures (including critical care  time)  Medications Ordered in UC Medications - No data to display  Initial Impression / Assessment and Plan / UC Course  I have reviewed the triage vital signs and the nursing notes.  Pertinent labs & imaging results that were available during my care of the patient were reviewed by me and considered in my medical decision making (see chart for details).  Abdominal cramping  Unknown etiology, vital signs are stable, due to timeline and presentation low suspicion for an acute abdomen, no tenderness is noted on exam and no accompanying symptoms, urinalysis, urine pregnancy negative, CBG in office 139, STI labs pending, will treat further per protocol, advised abstinence until test results, treatment is complete and all symptoms have resolved, recommended follow-up with PCP if all testing today is negative for reevaluation  Final diagnoses:  None   Discharge Instructions   None    ED Prescriptions   None    PDMP not reviewed this encounter.   Hans Eden, NP 01/06/22 1515

## 2022-01-06 NOTE — Discharge Instructions (Addendum)
The cause for your abdominal cramping is unknown at this time  Your CBG level in office was  Your urinalysis was negative for infection  A urine pregnancy test is negative  Vaginal swab and blood work to rule out STI infections are pending, you will be notified of positive test results only in treatment sent in at that time, please refrain from having sex until all testing has returned  Please schedule follow-up appointment with your primary doctor in 1 week for reevaluation of symptoms  You may continue use of ibuprofen as you have deemed it helpful, may also use Tylenol and or warm compresses for additional support

## 2022-01-06 NOTE — ED Triage Notes (Signed)
Pt reports intermittent mid abd cramps for month. Denies n/v/d or urinary problems. LMP 11/16/21

## 2022-01-07 LAB — CERVICOVAGINAL ANCILLARY ONLY
Bacterial Vaginitis (gardnerella): NEGATIVE
Candida Glabrata: NEGATIVE
Candida Vaginitis: POSITIVE — AB
Chlamydia: NEGATIVE
Comment: NEGATIVE
Comment: NEGATIVE
Comment: NEGATIVE
Comment: NEGATIVE
Comment: NEGATIVE
Comment: NORMAL
Neisseria Gonorrhea: NEGATIVE
Trichomonas: NEGATIVE

## 2022-01-07 LAB — T.PALLIDUM AB, TOTAL: T Pallidum Abs: REACTIVE — AB

## 2022-01-07 NOTE — Telephone Encounter (Signed)
Requested medication (s) are due for refill today: yes  Requested medication (s) are on the active medication list: yes  Last refill:  08/08/21 #45m/4  Future visit scheduled: yes 01/14/22 1:20  Notes to clinic:  pt is due for appt. Scheduled appt. Please advise if ok for refill     Requested Prescriptions  Pending Prescriptions Disp Refills   LANTUS SOLOSTAR 100 UNIT/ML Solostar Pen [Pharmacy Med Name: LANTUS SOLOSTAR 100 UNIT/ML]  1    Sig: INJECT 35 UNITS INTO THE SKIN DAILY.     Endocrinology:  Diabetes - Insulins Failed - 01/06/2022  9:06 PM      Failed - HBA1C is between 0 and 7.9 and within 180 days    Hemoglobin A1C  Date Value Ref Range Status  12/11/2021 8.2 (A) 4.0 - 5.6 % Final   Hgb A1c MFr Bld  Date Value Ref Range Status  02/25/2021 14.8 (H) 4.8 - 5.6 % Final    Comment:             Prediabetes: 5.7 - 6.4          Diabetes: >6.4          Glycemic control for adults with diabetes: <7.0          Failed - Valid encounter within last 6 months    Recent Outpatient Visits           10 months ago Type 2 diabetes mellitus without complication, with long-term current use of insulin ([facility]   [facility] [place] [facility]   1 year ago Encounter to establish care   [facility]   2 years ago Type 2 diabetes mellitus with hyperglycemia, with long-term current use of insulin ([facility]   [place] RPH-CPP   3 years ago Type 2 diabetes mellitus with hyperglycemia, with long-term current use of insulin ([facility]   [facility]   3 years ago Mixed hyperlipidemia   [facility]roll Sage FNP       Future Appointments             In 1 week SCamillia Herter NP [facility]

## 2022-01-08 ENCOUNTER — Telehealth (HOSPITAL_COMMUNITY): Payer: Self-pay | Admitting: Emergency Medicine

## 2022-01-08 ENCOUNTER — Other Ambulatory Visit: Payer: Self-pay | Admitting: Internal Medicine

## 2022-01-08 MED ORDER — OMNIPOD 5 DEXG7G6 INTRO GEN 5 KIT: 1.0000 | PACK | 0 refills | Status: DC

## 2022-01-08 MED ORDER — FLUCONAZOLE 150 MG PO TABS: 150.0000 mg | ORAL_TABLET | Freq: Once | ORAL | 0 refills | Status: AC

## 2022-01-08 NOTE — Telephone Encounter (Signed)
done

## 2022-01-08 NOTE — Telephone Encounter (Signed)
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   Will also need treatment with Diflucan, per protocol.   Attempted to reach patient x 1, VM is not setup Prescription sent to pharmacy on file HHS notified

## 2022-01-08 NOTE — Telephone Encounter (Signed)
Scheduled patient for pump start next Wednesday.  She reports that OmniPOd starter kit was not ordered at the pharmacy.  She only has the pods.  Please order OmniPod 5 starter kit.  Thank you

## 2022-01-09 ENCOUNTER — Other Ambulatory Visit: Payer: Self-pay

## 2022-01-09 ENCOUNTER — Telehealth: Payer: Self-pay | Admitting: Pharmacy Technician

## 2022-01-09 ENCOUNTER — Other Ambulatory Visit (HOSPITAL_COMMUNITY): Payer: Self-pay

## 2022-01-09 MED ORDER — OMNIPOD 5 DEXG7G6 PODS GEN 5 MISC: 1.0000 | 3 refills | Status: DC

## 2022-01-09 MED ORDER — OMNIPOD 5 DEXG7G6 INTRO GEN 5 KIT: 1.0000 | PACK | 0 refills | Status: DC

## 2022-01-09 NOTE — Telephone Encounter (Signed)
Patient states insurance paid for the pods and she received in July.

## 2022-01-09 NOTE — Telephone Encounter (Signed)
Patient Advocate Encounter   Received notification from Pharmacy/CMA in office that prior authorization for Omnipod 5 G6 kid is required/requested.  Per Test Claim: QUANTITY OVER 1/YEAR NOT COVERED BY RX BENEFIT. BILL PREFERRED QTY OR SUBMIT [employer] [facility] FAX [phone] PLAN EXCLUSION - (I called b/c I couldn't see that the pt had filled this and the pharmacy said she had not either- They said it was either b/c she had gotten the [facility] or it just needed a PA for being a plan exclusion)   PA submitted on 01/09/22 to [employer]  via CoverMyMeds Key [id number] - [place] Case ID: [id number] Status is pending  Pharmacy Patient Advocate Fax:  [phone]

## 2022-01-13 ENCOUNTER — Telehealth (HOSPITAL_COMMUNITY): Payer: Self-pay

## 2022-01-13 NOTE — Telephone Encounter (Signed)
Mercedes Valdez, [facility] department called to verify demographics, labs and treatment.

## 2022-01-14 ENCOUNTER — Other Ambulatory Visit (HOSPITAL_COMMUNITY): Payer: Self-pay

## 2022-01-14 ENCOUNTER — Ambulatory Visit: Payer: Medicaid Other | Admitting: Family

## 2022-01-14 NOTE — Progress Notes (Deleted)
Patient ID: Mercedes Valdez, female    DOB: 1996-01-15  MRN: [id number]  CC: Annual Physical Exam  Subjective: Mercedes Valdez is a 26 y.o. female who presents for annual physical exam.   Her concerns today include:  DM - Endo  Patient Active Problem List   Diagnosis Date Noted   Dyslipidemia 05/09/2021   Type 2 diabetes mellitus with both eyes affected by proliferative retinopathy and macular edema, with long-term current use of insulin ([place]) 05/09/2021   History of herpes simplex infection 10/08/2020   Psychosocial stressors 09/26/2014   Insulin dependent type 2 diabetes mellitus ([place]) 09/06/2014   Multiple drug allergies 02/27/2014   Musculoskeletal pain 04/08/2013   Unspecified gastritis and gastroduodenitis without mention of hemorrhage 04/08/2013   Type 2 diabetes mellitus ([place]) 04/08/2013   Irregular menses 04/08/2013   Dysfunctional uterine bleeding 11/09/2012   PCOS (polycystic ovarian syndrome) 09/29/2012   Asthma      Current Outpatient Medications on File Prior to Visit  Medication Sig Dispense Refill   Accu-Chek Softclix Lancets lancets To check blood sugars 4 times a day. Fasting and 2 hours after breakfast, lunch and dinner. 100 each 8   Blood Pressure Monitoring (BLOOD PRESSURE KIT) DEVI 1 Device by Does not apply route once a week. Please take blood pressure once a week and record in Babyscripts. 1 each 0   Continuous Blood Gluc Sensor (DEXCOM G6 SENSOR) MISC 1 Device by Does not apply route as directed. 9 each 3   Continuous Blood Gluc Transmit (DEXCOM G6 TRANSMITTER) MISC 1 Device by Does not apply route as directed. 1 each 3   empagliflozin (JARDIANCE) 25 MG TABS tablet Take 1 tablet (25 mg total) by mouth daily before breakfast. 90 tablet 2   glucose blood (ACCU-CHEK GUIDE) test strip To check blood sugars 4 times a day. Fasting, and 2 hours after Breakfast, Lunch and Dinner 100 each 8   ibuprofen (ADVIL) 800 MG tablet Take 800 mg by mouth 3 (three) times  daily.     insulin aspart (NOVOLOG) 100 UNIT/ML FlexPen Max daily 50 units (Patient taking differently: 14 Units 3 (three) times daily with meals. Max daily 50 units) 30 mL 6   Insulin Disposable Pump (OMNIPOD 5 G6 INTRO, GEN 5,) KIT 1 Device by Does not apply route every other day. 1 kit 0   Insulin Disposable Pump (OMNIPOD 5 G6 POD, GEN 5,) MISC 1 Device by Does not apply route every other day. 15 each 3   insulin glargine (LANTUS SOLOSTAR) 100 UNIT/ML Solostar Pen Inject 42 Units into the skin daily. 45 mL 4   Insulin Pen Needle 31G X 5 MM MISC 1 Device by Does not apply route in the morning, at noon, in the evening, and at bedtime. 400 each 2   Misc. Devices (GOJJI WEIGHT SCALE) MISC 1 Device by Does not apply route once a week. Please take weight and record in Babyscripts once a week. 1 each 0   [DISCONTINUED] atorvastatin (LIPITOR) 20 MG tablet Take 1 tablet (20 mg total) by mouth daily. To lower cholesterol (Patient not taking: Reported on 08/29/2019) 90 tablet 1   No current facility-administered medications on file prior to visit.    Allergies  Allergen Reactions   Amoxicillin Hives   Augmentin [Amoxicillin-Pot Clavulanate] Hives   Penicillins Hives    Has patient had a PCN reaction causing immediate rash, facial/tongue/throat swelling, SOB or lightheadedness with hypotension: No Has patient had a PCN reaction causing severe rash  involving mucus membranes or skin necrosis: No Has patient had a PCN reaction that required hospitalization No Has patient had a PCN reaction occurring within the last 10 years: No If all of the above answers are "NO", then may proceed with Cephalosporin use.   Shellfish Allergy Swelling and Other (See Comments)    Itching lips   Suprax [Cefixime]     Pt unsure reaction, may have been childhood reaction   Latex Rash    Social History   Socioeconomic History   Marital status: Single    Spouse name: Not on file   Number of children: Not on file    Years of education: 11   Highest education level: Not on file  Occupational History   Occupation: Biochemist, clinical    Comment: RRD  Tobacco Use   Smoking status: Every Day    Packs/day: 0.25    Years: 6.00    Total pack years: 1.50    Types: Cigarettes   Smokeless tobacco: Never   Tobacco comments:    11-16-2020  per pt 3 cig per day  Vaping Use   Vaping Use: Former   Devices: uses different types  Substance and Sexual Activity   Alcohol use: Yes    Comment: socially   Drug use: Not Currently    Types: Marijuana   Sexual activity: Yes    Birth control/protection: None, Injection  Other Topics Concern   Not on file  Social History Narrative   Not on file   Social Determinants of Health   Financial Resource Strain: Not on file  Food Insecurity: No Food Insecurity (01/21/2021)   Hunger Vital Sign    Worried About Running Out of Food in the Last Year: Never true    [place] in the Last Year: Never true  Transportation Needs: No Transportation Needs (01/21/2021)   PRAPARE - Hydrologist (Medical): No    Lack of Transportation (Non-Medical): No  Physical Activity: Not on file  Stress: Not on file  Social Connections: Not on file  Intimate Partner Violence: Not on file    Family History  Problem Relation Age of Onset   Diabetes Mother    Vision loss Mother    ADD / ADHD Brother    Allergies Brother    Asthma Brother    Vision loss Brother    Cancer Paternal Grandfather        Colon Cancer   Heart disease Father 53       died from MI at age 9    Past Surgical History:  Procedure Laterality Date   DILATATION & CURETTAGE/HYSTEROSCOPY WITH MYOSURE N/A 11/21/2020   Procedure: [place];  Surgeon: Aletha Halim, MD;  Location: [place];  Service: Gynecology;  Laterality: N/A;   INJECTION OF SILICONE OIL Right 11/22/4763   Procedure: INJECTION OF SILICONE OIL;  Surgeon:  Bernarda Caffey, MD;  Location: [place];  Service: Ophthalmology;  Laterality: Right;   KNEE ARTHROSCOPY W/ ACL RECONSTRUCTION Left 2011   MEMBRANE PEEL Right 02/07/2021   Procedure: MEMBRANE PEEL;  Surgeon: Bernarda Caffey, MD;  Location: [place];  Service: Ophthalmology;  Laterality: Right;   PARS PLANA VITRECTOMY Right 02/07/2021   Procedure: TWENTY-FIVE  GAUGE PARS PLANA VITRECTOMY;  Surgeon: Bernarda Caffey, MD;  Location: [place];  Service: Ophthalmology;  Laterality: Right;   PERFLUORONE INJECTION Right 02/07/2021   Procedure: PERFLUORON INJECTION;  Surgeon: Bernarda Caffey, MD;  [facility];  Service: Ophthalmology;  Laterality: Right;   PHOTOCOAGULATION WITH LASER Right 02/07/2021   Procedure: PHOTOCOAGULATION WITH LASER;  Surgeon: Bernarda Caffey, MD;  Location: [place];  Service: Ophthalmology;  Laterality: Right;   TYMPANOSTOMY TUBE PLACEMENT Bilateral    child    ROS: Review of Systems Negative except as stated above  PHYSICAL EXAM: LMP 11/16/2021   Physical Exam  {female adult master:310786} {female adult master:310785}     Latest Ref Rng & Units 05/08/2021    2:49 PM 01/16/2021    9:27 AM 11/19/2020   10:38 AM  CMP  Glucose 70 - 99 mg/dL 100  152  261   BUN 6 - 23 mg/dL 8  6  12    Creatinine 0.40 - 1.20 mg/dL 0.48  0.48  0.55   Sodium 135 - 145 mEq/L 137  138  139   Potassium 3.5 - 5.1 mEq/L 4.6  3.5  4.2   Chloride 96 - 112 mEq/L 100  108  107   CO2 19 - 32 mEq/L 28  23  24    Calcium 8.4 - 10.5 mg/dL 10.5  8.5  9.2   Total Protein 6.5 - 8.1 g/dL   7.9   Total Bilirubin 0.3 - 1.2 mg/dL   0.6   Alkaline Phos 38 - 126 U/L   65   AST 15 - 41 U/L   19   ALT 0 - 44 U/L   20    Lipid Panel     Component Value Date/Time   CHOL 224 (H) 05/08/2021 1449   CHOL 230 (H) 11/01/2018 1127   TRIG 183.0 (H) 05/08/2021 1449   HDL 44.10 05/08/2021 1449   HDL 41 11/01/2018 1127   CHOLHDL 5 05/08/2021 1449   VLDL 36.6 05/08/2021 1449   LDLCALC 143 (H) 05/08/2021 1449   LDLCALC 161 (H)  11/01/2018 1127    CBC    Component Value Date/Time   WBC 10.2 01/16/2021 0927   RBC 4.61 01/16/2021 0927   HGB 12.7 01/16/2021 0927   HGB 12.8 11/01/2018 1127   HCT 38.3 01/16/2021 0927   HCT 40.2 11/01/2018 1127   PLT 546 (H) 01/16/2021 0927   PLT 629 (H) 11/01/2018 1127   MCV 83.1 01/16/2021 0927   MCV 81 11/01/2018 1127   MCH 27.5 01/16/2021 0927   MCHC 33.2 01/16/2021 0927   RDW 13.2 01/16/2021 0927   RDW 14.3 11/01/2018 1127   LYMPHSABS 3.8 (H) 11/01/2018 1127   MONOABS 0.5 07/07/2017 1248   EOSABS 0.2 11/01/2018 1127   BASOSABS 0.0 11/01/2018 1127    ASSESSMENT AND PLAN:  There are no diagnoses linked to this encounter.   Patient was given the opportunity to ask questions.  Patient verbalized understanding of the plan and was able to repeat key elements of the plan. Patient was given clear instructions to go to Emergency Department or return to medical center if symptoms don't improve, worsen, or new problems develop.The patient verbalized understanding.   No orders of the defined types were placed in this encounter.    Requested Prescriptions    No prescriptions requested or ordered in this encounter    No follow-ups on file.  Camillia Herter, NP

## 2022-01-14 NOTE — Telephone Encounter (Signed)
Patient Advocate Encounter  Prior Authorization for Omnipod G6 Kit has been cancelled.   However, the pharmacy was able to fill and pt has picked it up.

## 2022-01-14 NOTE — Telephone Encounter (Signed)
You will have to call and cancel

## 2022-01-15 ENCOUNTER — Encounter: Payer: Medicaid Other | Attending: Internal Medicine | Admitting: Nutrition

## 2022-01-15 ENCOUNTER — Other Ambulatory Visit: Payer: Self-pay | Admitting: Internal Medicine

## 2022-01-15 DIAGNOSIS — Z794 Long term (current) use of insulin: Secondary | ICD-10-CM | POA: Insufficient documentation

## 2022-01-15 DIAGNOSIS — E119 Type 2 diabetes mellitus without complications: Secondary | ICD-10-CM | POA: Insufficient documentation

## 2022-01-15 MED ORDER — INSULIN ASPART 100 UNIT/ML IJ SOLN
INTRAMUSCULAR | 3 refills | Status: DC
Start: 2022-01-15 — End: 2022-04-29

## 2022-01-15 NOTE — Patient Instructions (Addendum)
Stop all Lantus insulin Read over pump manual and starter booklet Call pump help line if questions on how to use this pump,  Call office if blood sugars dropping low or remain over 225.

## 2022-01-15 NOTE — Progress Notes (Signed)
Patient was trained on the use of the Omnipod 5 pump.  Her dexcom was linked to the pdm, and to [place], and the PDM was linked to [facility].   Settings were put into the pdm by the patient:  basal rate: 1.3u/hr, I/C:  1 with 14u to be given with each meal.  I strongly encouraged her to count grams of carbohydrate, but she did not wish to do this.  ISF: 30, timing: 4 hours, target: 110, with correction over 110.  She filled a pod with 200u of Novolog insulin and attached the pod to her right arm.  We reviewed how/where to attach the pods with reference to the Dexcom sensors.  The pod was started at [facility] and she was reminded to take no more Lantus insulin, and she agreed to stop this. We reviewed how to bolus, and do correction doses.  She re demonstrated both of these correctly.  We reviewed all other topics on the pump checklist and reported good understanding of all of them.  She had no final questions.

## 2022-01-17 ENCOUNTER — Encounter: Payer: Medicaid Other | Admitting: Family

## 2022-01-17 DIAGNOSIS — Z130 Encounter for screening for diseases of the blood and blood-forming organs and certain disorders involving the immune mechanism: Secondary | ICD-10-CM

## 2022-01-17 DIAGNOSIS — Z0000 Encounter for general adult medical examination without abnormal findings: Secondary | ICD-10-CM

## 2022-01-17 DIAGNOSIS — Z1329 Encounter for screening for other suspected endocrine disorder: Secondary | ICD-10-CM

## 2022-01-17 DIAGNOSIS — Z13228 Encounter for screening for other metabolic disorders: Secondary | ICD-10-CM

## 2022-01-17 DIAGNOSIS — Z1159 Encounter for screening for other viral diseases: Secondary | ICD-10-CM

## 2022-01-17 DIAGNOSIS — Z13220 Encounter for screening for lipoid disorders: Secondary | ICD-10-CM

## 2022-01-18 ENCOUNTER — Telehealth: Payer: Self-pay | Admitting: Nutrition

## 2022-01-18 NOTE — Telephone Encounter (Signed)
Patient reports no difficulty using the PDM.  Reports pod fell off first day in the shower.  Told her to go on line and order Skin tac.  She agreed to do this.  Reported having changed the pod without difficulty.   Says FBS today was 122, and yesterday was 110, without any low blood sugars.  She also reports bolusing for every meal, putting in blood sugar readings from her dexcom.  She had no questions for me at this time.

## 2022-01-20 IMAGING — US US TRANSVAGINAL NON-OB
1 series · 13 of 25 positions shown · non-contrast
Comparison: 10/14/2020

CLINICAL DATA: Molar pregnancy follow-up, miscarriage

EXAM:
ULTRASOUND PELVIS TRANSVAGINAL
TECHNIQUE: Transvaginal ultrasound examination of the pelvis was performed
including evaluation of the uterus, ovaries, adnexal regions, and
pelvic cul-de-sac.

[Series 1: us transvaginal non-ob · 71 acquisitions, 13 frames shown]
[im 1/71]
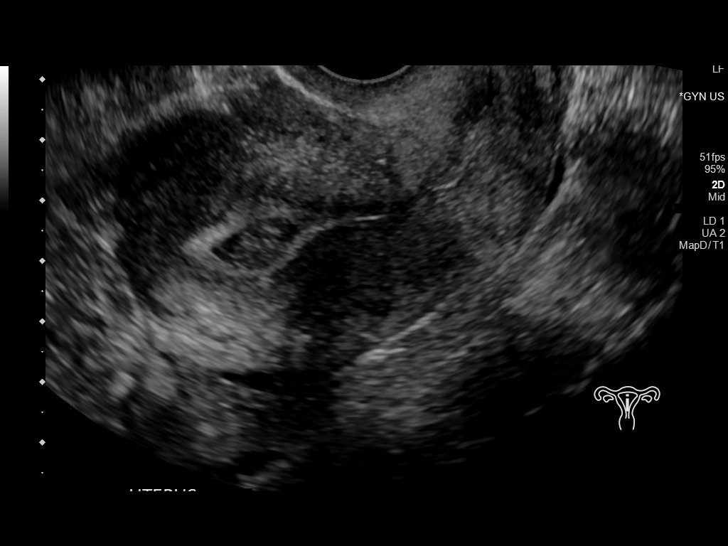
[im 6/71]
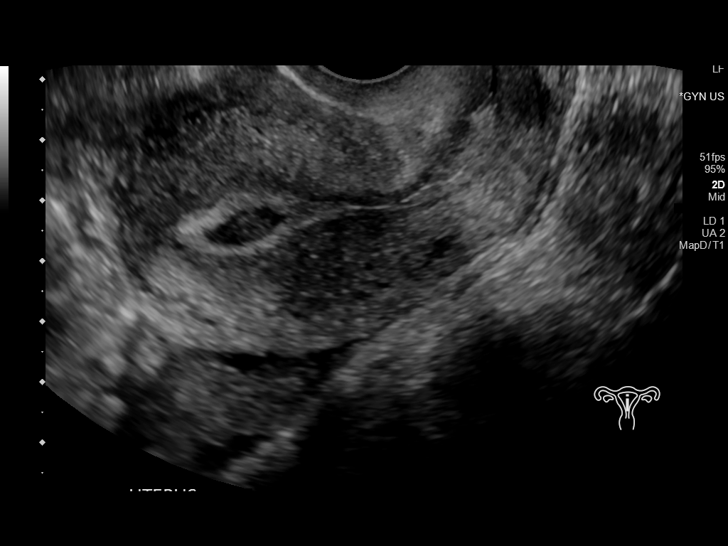
[im 12/71]
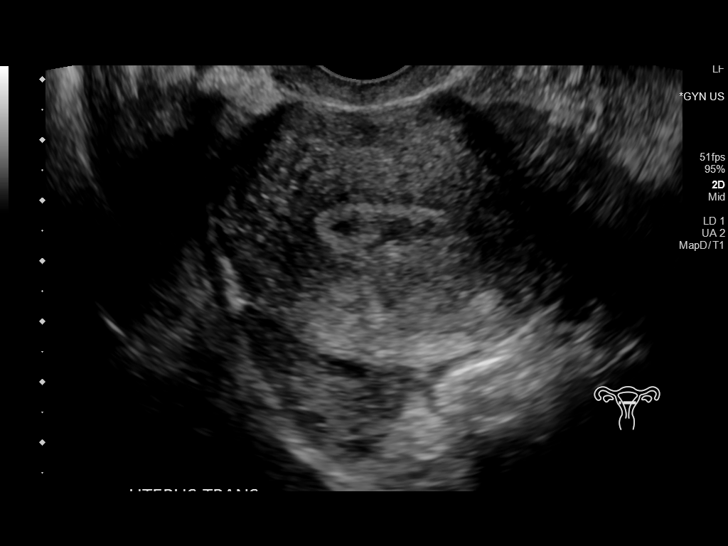
[im 18/71]
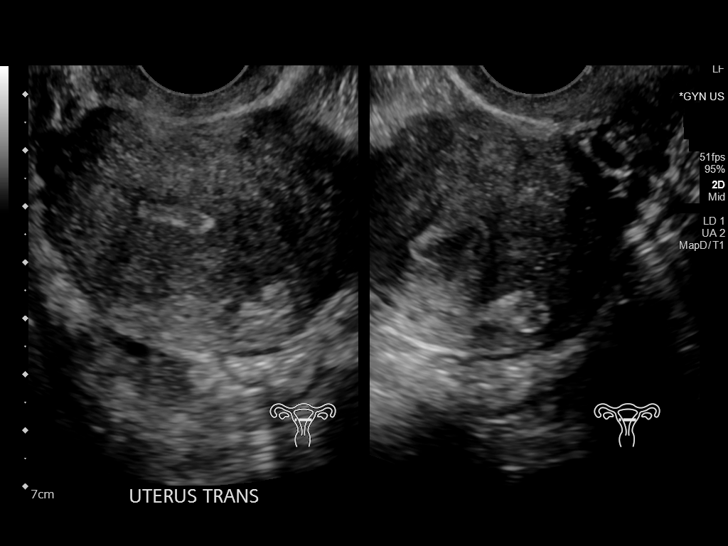
[im 24/71]
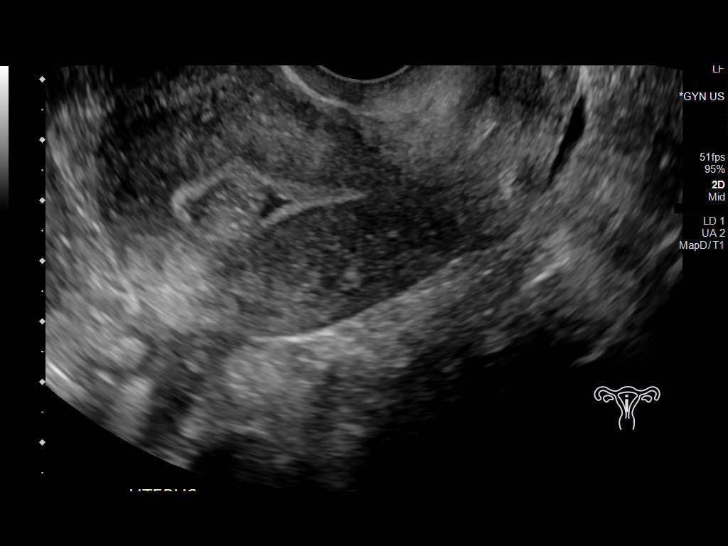
[im 30/71]
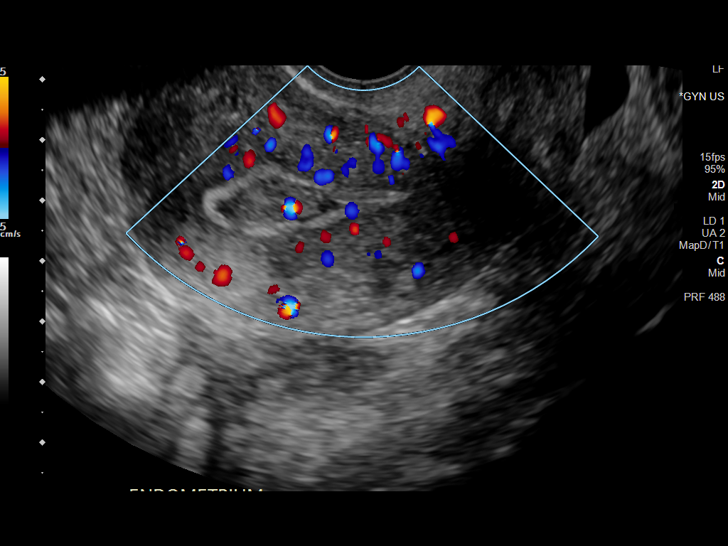
[im 36/71]
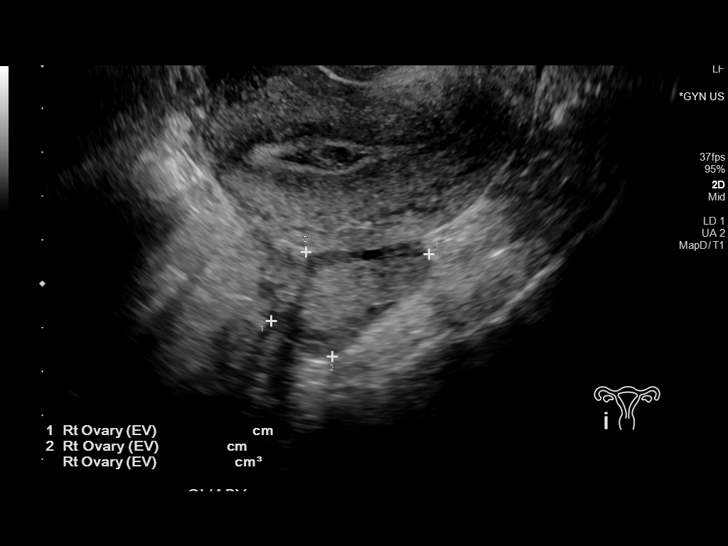
[im 41/71]
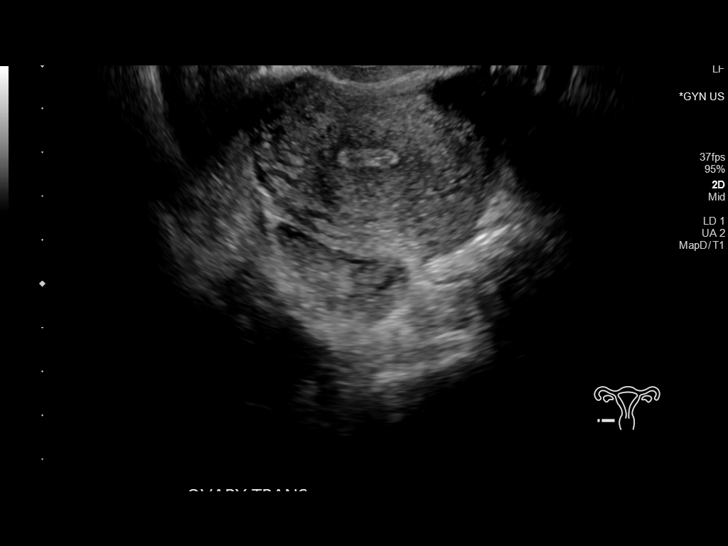
[im 47/71]
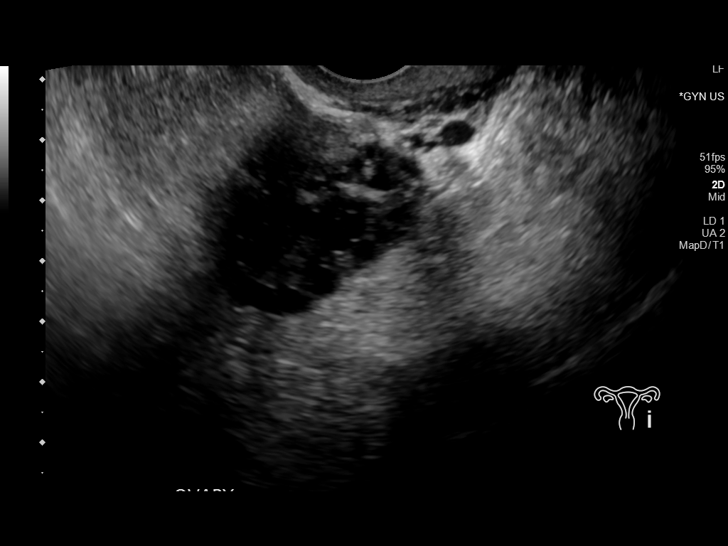
[im 53/71]
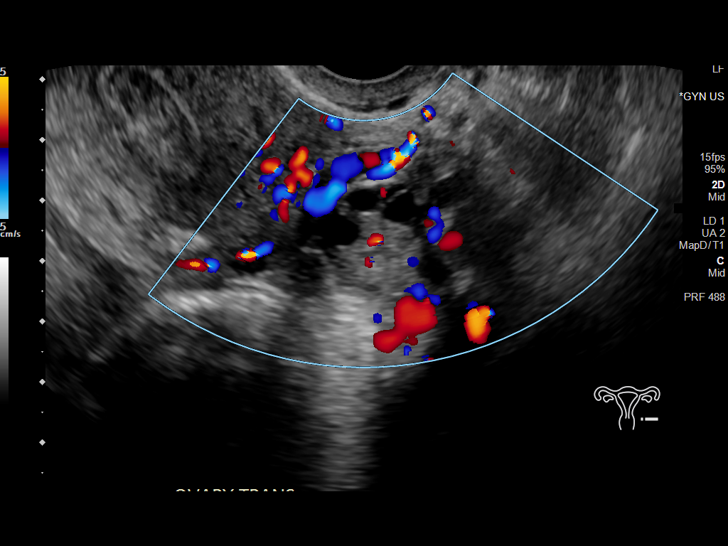
[im 59/71]
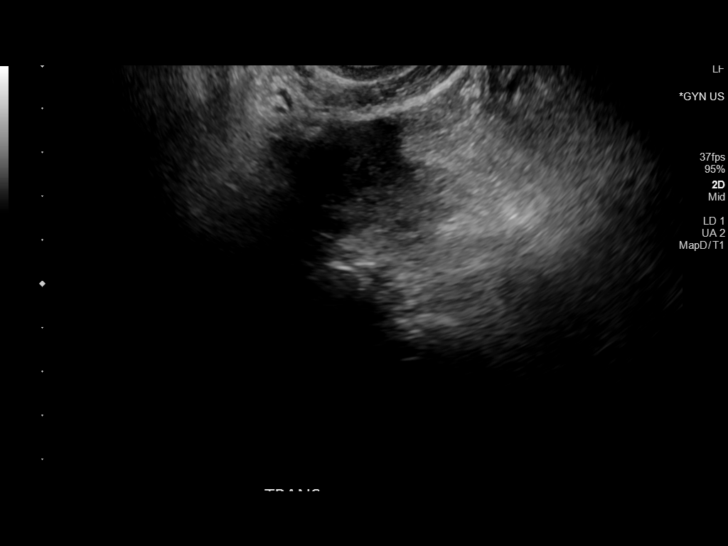
[im 65/71]
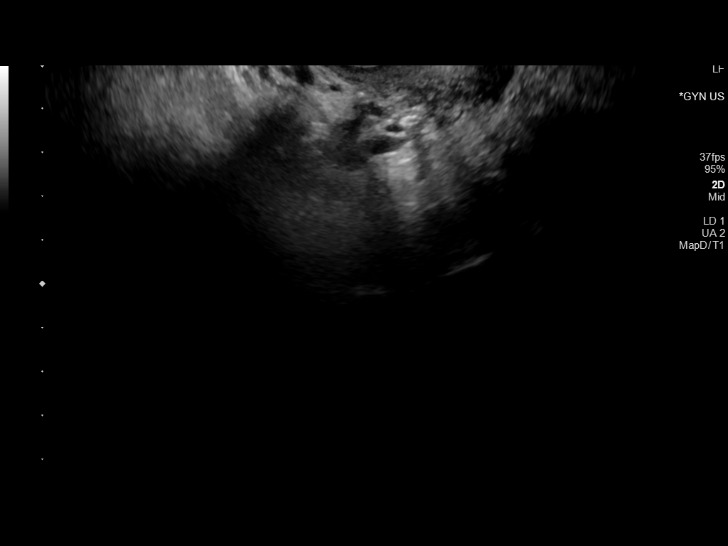
[im 71/71]
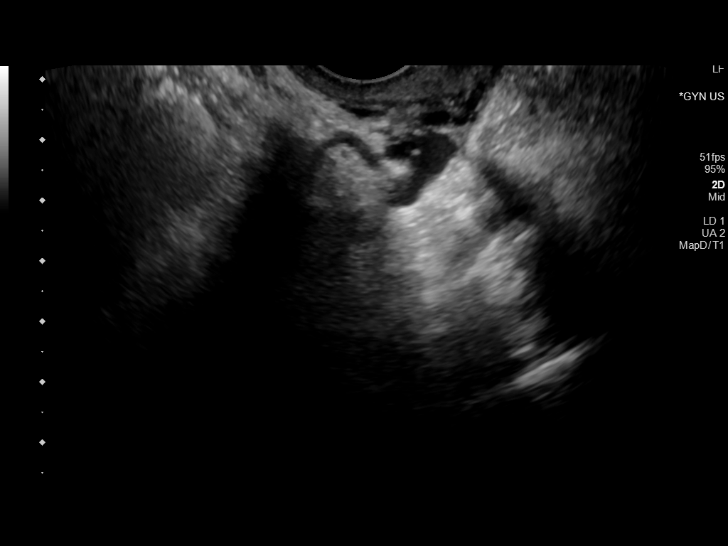

[13 of 25 positions shown; findings below may reference images not displayed]

FINDINGS: Uterus

Measurements: 8.0 x 4.5 x 6.2 cm = volume: 116 mL. Anteverted.
Heterogeneous myometrium. Small posterior wall intramural leiomyoma
12 x 7 x 10 mm. The

Endometrium

Thickness: 5 mm. Trace endometrial fluid. Nodular focus within
endometrial canal 2.4 x 0.9 x 1.5 cm, decreased since previous exam,
may represent retained products of conception, retained molar
tissue, unlikely to represent hemorrhage since this nodular focus
contains internal blood flow on color Doppler imaging.

Right ovary

Measurements: 3.9 x 2.5 x 2.9 cm = volume: 14.6 mL. Normal
morphology without mass

Left ovary

Measurements: 4.0 x 2.0 x 1.9 cm = volume: 8.0 mL. Normal morphology
without mass

Other findings:  Trace free pelvic fluid.  No adnexal masses.
IMPRESSION: 12 mm intramural leiomyoma posterior upper uterus.

2.4 x 0.9 x 1.5 cm diameter nodule within endometrial canal
containing internal vascularity on color Doppler imaging, may
represent retained products of conception or retained molar tissue.

## 2022-02-19 NOTE — Progress Notes (Signed)
Patient ID: Mercedes Valdez, female    DOB: 01/22/1996  MRN: [id number]  CC: Annual Exam   Subjective: Mercedes Valdez is a 26 y.o. female who presents for annual exam.   Her concerns today include:  Established with endocrinology for diabetes management. Reports since last appointment had surgery of right eye and no longer has vision. States left eye vision is ok. Reports she has [facility] and thinks her eye doctor may no longer see her soon. Thinking of applying for Medicaid.   Patient Active Problem List   Diagnosis Date Noted   Dyslipidemia 05/09/2021   Type 2 diabetes mellitus with both eyes affected by proliferative retinopathy and macular edema, with long-term current use of insulin ([place]) 05/09/2021   History of herpes simplex infection 10/08/2020   Psychosocial stressors 09/26/2014   Insulin dependent type 2 diabetes mellitus ([place]) 09/06/2014   Multiple drug allergies 02/27/2014   Musculoskeletal pain 04/08/2013   Unspecified gastritis and gastroduodenitis without mention of hemorrhage 04/08/2013   Type 2 diabetes mellitus ([place]) 04/08/2013   Irregular menses 04/08/2013   Dysfunctional uterine bleeding 11/09/2012   PCOS (polycystic ovarian syndrome) 09/29/2012   Asthma      Current Outpatient Medications on File Prior to Visit  Medication Sig Dispense Refill   Accu-Chek Softclix Lancets lancets To check blood sugars 4 times a day. Fasting and 2 hours after breakfast, lunch and dinner. 100 each 8   Blood Pressure Monitoring (BLOOD PRESSURE KIT) DEVI 1 Device by Does not apply route once a week. Please take blood pressure once a week and record in Babyscripts. 1 each 0   Continuous Blood Gluc Sensor (DEXCOM G6 SENSOR) MISC 1 Device by Does not apply route as directed. 9 each 3   Continuous Blood Gluc Transmit (DEXCOM G6 TRANSMITTER) MISC 1 Device by Does not apply route as directed. 1 each 3   empagliflozin (JARDIANCE) 25 MG TABS tablet Take 1 tablet (25 mg  total) by mouth daily before breakfast. 90 tablet 2   glucose blood (ACCU-CHEK GUIDE) test strip To check blood sugars 4 times a day. Fasting, and 2 hours after Breakfast, Lunch and Dinner 100 each 8   ibuprofen (ADVIL) 800 MG tablet Take 800 mg by mouth 3 (three) times daily.     insulin aspart (NOVOLOG) 100 UNIT/ML injection Max daily 60 units per pump 60 mL 3   Insulin Disposable Pump (OMNIPOD 5 G6 INTRO, GEN 5,) KIT 1 Device by Does not apply route every other day. 1 kit 0   Insulin Disposable Pump (OMNIPOD 5 G6 POD, GEN 5,) MISC 1 Device by Does not apply route every other day. 15 each 3   Insulin Pen Needle 31G X 5 MM MISC 1 Device by Does not apply route in the morning, at noon, in the evening, and at bedtime. 400 each 2   Misc. Devices (GOJJI WEIGHT SCALE) MISC 1 Device by Does not apply route once a week. Please take weight and record in Babyscripts once a week. 1 each 0   [DISCONTINUED] atorvastatin (LIPITOR) 20 MG tablet Take 1 tablet (20 mg total) by mouth daily. To lower cholesterol (Patient not taking: Reported on 08/29/2019) 90 tablet 1   No current facility-administered medications on file prior to visit.    Allergies  Allergen Reactions   Amoxicillin Hives   Augmentin [Amoxicillin-Pot Clavulanate] Hives   Penicillins Hives    Has patient had a PCN reaction causing immediate rash, facial/tongue/throat swelling, SOB or lightheadedness  with hypotension: No Has patient had a PCN reaction causing severe rash involving mucus membranes or skin necrosis: No Has patient had a PCN reaction that required hospitalization No Has patient had a PCN reaction occurring within the last 10 years: No If all of the above answers are "NO", then may proceed with Cephalosporin use.   Shellfish Allergy Swelling and Other (See Comments)    Itching lips   Suprax [Cefixime]     Pt unsure reaction, may have been childhood reaction   Latex Rash    Social History   Socioeconomic History   Marital  status: Single    Spouse name: Not on file   Number of children: Not on file   Years of education: 11   Highest education level: Not on file  Occupational History   Occupation: Biochemist, clinical    Comment: RRD  Tobacco Use   Smoking status: Every Day    Packs/day: 0.25    Years: 6.00    Total pack years: 1.50    Types: Cigarettes    Passive exposure: Current   Smokeless tobacco: Never   Tobacco comments:    11-16-2020  per pt 3 cig per day  Vaping Use   Vaping Use: Former   Devices: uses different types  Substance and Sexual Activity   Alcohol use: Yes    Comment: socially   Drug use: Not Currently    Types: Marijuana   Sexual activity: Yes    Birth control/protection: None, Injection  Other Topics Concern   Not on file  Social History Narrative   Not on file   Social Determinants of Health   Financial Resource Strain: Not on file  Food Insecurity: No Food Insecurity (01/21/2021)   Hunger Vital Sign    Worried About Running Out of Food in the Last Year: Never true    [place] in the Last Year: Never true  Transportation Needs: No Transportation Needs (01/21/2021)   PRAPARE - Hydrologist (Medical): No    Lack of Transportation (Non-Medical): No  Physical Activity: Not on file  Stress: Not on file  Social Connections: Not on file  Intimate Partner Violence: Not on file    Family History  Problem Relation Age of Onset   Diabetes Mother    Vision loss Mother    ADD / ADHD Brother    Allergies Brother    Asthma Brother    Vision loss Brother    Cancer Paternal Grandfather        Colon Cancer   Heart disease Father 60       died from MI at age 47    Past Surgical History:  Procedure Laterality Date   DILATATION & CURETTAGE/HYSTEROSCOPY WITH MYOSURE N/A 11/21/2020   Procedure: [place];  Surgeon: Aletha Halim, MD;  Location: [place];  Service: Gynecology;   Laterality: N/A;   INJECTION OF SILICONE OIL Right 2/59/5638   Procedure: INJECTION OF SILICONE OIL;  Surgeon: Bernarda Caffey, MD;  Location: [place];  Service: Ophthalmology;  Laterality: Right;   KNEE ARTHROSCOPY W/ ACL RECONSTRUCTION Left 2011   MEMBRANE PEEL Right 02/07/2021   Procedure: MEMBRANE PEEL;  Surgeon: Bernarda Caffey, MD;  Location: [place];  Service: Ophthalmology;  Laterality: Right;   PARS PLANA VITRECTOMY Right 02/07/2021   Procedure: TWENTY-FIVE  GAUGE PARS PLANA VITRECTOMY;  Surgeon: Bernarda Caffey, MD;  Location: [place];  Service: Ophthalmology;  Laterality: Right;  [place] INJECTION Right 02/07/2021   Procedure: PERFLUORON INJECTION;  Surgeon: Bernarda Caffey, MD;  Location: [place];  Service: Ophthalmology;  Laterality: Right;   PHOTOCOAGULATION WITH LASER Right 02/07/2021   Procedure: PHOTOCOAGULATION WITH LASER;  Surgeon: Bernarda Caffey, MD;  Location: [place];  Service: Ophthalmology;  Laterality: Right;   TYMPANOSTOMY TUBE PLACEMENT Bilateral    child    ROS: Review of Systems Negative except as stated above  PHYSICAL EXAM: BP 116/80 (BP Location: Left Arm, Patient Position: Sitting, Cuff Size: Large)   Pulse 96   Temp 98.3 F (36.8 C)   Resp 16   Ht 5' 5"  (1.651 m)   Wt 240 lb (108.9 kg)   SpO2 98%   Breastfeeding No   BMI 39.94 kg/m   Physical Exam HENT:     Head: Normocephalic and atraumatic.     Right Ear: Tympanic membrane, ear canal and external ear normal.     Left Ear: Tympanic membrane, ear canal and external ear normal.     Nose: Nose normal.     Mouth/Throat:     Mouth: Mucous membranes are moist.     Pharynx: Oropharynx is clear.  Eyes:     Extraocular Movements: Extraocular movements intact.     Conjunctiva/sclera: Conjunctivae normal.     Pupils: Pupils are equal, round, and reactive to light.  Cardiovascular:     Rate and Rhythm: Normal rate and regular rhythm.     Pulses: Normal pulses.     Heart sounds: Normal heart sounds.  Pulmonary:      Effort: Pulmonary effort is normal.     Breath sounds: Normal breath sounds.  Chest:     Comments: Patient declined.  Abdominal:     General: Bowel sounds are normal.     Palpations: Abdomen is soft.  Genitourinary:    Comments: Patient declined.  Musculoskeletal:        General: Normal range of motion.     Right shoulder: Normal.     Left shoulder: Normal.     Right upper arm: Normal.     Left upper arm: Normal.     Right elbow: Normal.     Left elbow: Normal.     Right forearm: Normal.     Left forearm: Normal.     Right wrist: Normal.     Left wrist: Normal.     Right hand: Normal.     Left hand: Normal.     Cervical back: Normal, normal range of motion and neck supple.     Thoracic back: Normal.     Lumbar back: Normal.     Right hip: Normal.     Left hip: Normal.     Right upper leg: Normal.     Left upper leg: Normal.     Right knee: Normal.     Left knee: Normal.     Right lower leg: Normal.     Left lower leg: Normal.     Right ankle: Normal.     Left ankle: Normal.     Right foot: Normal.     Left foot: Normal.  Skin:    General: Skin is warm and dry.     Capillary Refill: Capillary refill takes less than 2 seconds.  Neurological:     General: No focal deficit present.     Mental Status: She is alert and oriented to person, place, and time.  Psychiatric:        Mood and Affect: Mood normal.  Behavior: Behavior normal.     ASSESSMENT AND PLAN: 1. Annual physical exam - Counseled on 150 minutes of exercise per week as tolerated, healthy eating (including decreased daily intake of saturated fats, cholesterol, added sugars, sodium), STI prevention, and routine healthcare maintenance.  2. Screening for metabolic disorder - Routine screening.  - CMP14+EGFR  3. Screening for deficiency anemia - Routine screening.  - CBC  4. Hyperlipidemia, unspecified hyperlipidemia type - Routine screening.  - Lipid panel  5. Thyroid disorder screen -  Routine screening.  - TSH  6. Need for hepatitis C screening test - Routine screening.  - Hepatitis C Antibody  7. Need for immunization against influenza - Administered.  - Flu Vaccine QUAD 51moIM (Fluarix, Fluzone & Alfiuria Quad PF)  8. Type 2 diabetes mellitus with hyperglycemia, with long-term current use of insulin ([place] - Keep all scheduled appointments with Endocrinology.   9. Type 2 diabetes mellitus with both eyes affected by proliferative retinopathy and macular edema, with long-term current use of insulin ([place] 10. Diabetic eye exam ([facility] - Referral to Ophthalmology for evaluation and management.  - Ambulatory referral to Ophthalmology  11. Financial difficulties - Offered patient [place] financial discount/orange card. Discussed patient will need to mail in completed application for processing and schedule appointment with financial counselor. Patient verbalized understanding.   Patient was given the opportunity to ask questions.  Patient verbalized understanding of the plan and was able to repeat key elements of the plan. Patient was given clear instructions to go to Emergency Department or return to medical center if symptoms don't improve, worsen, or new problems develop.The patient verbalized understanding.   Orders Placed This Encounter  Procedures   Flu Vaccine QUAD 640moM (Fluarix, Fluzone & Alfiuria Quad PF)   Hepatitis C Antibody   CBC   Lipid panel   TSH   CMP14+EGFR   Ambulatory referral to Ophthalmology     Return in about 1 year (around 02/27/2023) for Physical per patient preference. Give [employer] application. . Camillia HerterNP

## 2022-02-26 ENCOUNTER — Ambulatory Visit (INDEPENDENT_AMBULATORY_CARE_PROVIDER_SITE_OTHER): Payer: Medicaid Other | Admitting: Family

## 2022-02-26 ENCOUNTER — Encounter: Payer: Self-pay | Admitting: Family

## 2022-02-26 VITALS — BP 116/80 | HR 96 | Temp 98.3°F | Resp 16 | Ht 65.0 in | Wt 240.0 lb

## 2022-02-26 DIAGNOSIS — Z1329 Encounter for screening for other suspected endocrine disorder: Secondary | ICD-10-CM

## 2022-02-26 DIAGNOSIS — Z599 Problem related to housing and economic circumstances, unspecified: Secondary | ICD-10-CM

## 2022-02-26 DIAGNOSIS — Z130 Encounter for screening for diseases of the blood and blood-forming organs and certain disorders involving the immune mechanism: Secondary | ICD-10-CM | POA: Diagnosis not present

## 2022-02-26 DIAGNOSIS — E119 Type 2 diabetes mellitus without complications: Secondary | ICD-10-CM

## 2022-02-26 DIAGNOSIS — Z794 Long term (current) use of insulin: Secondary | ICD-10-CM

## 2022-02-26 DIAGNOSIS — Z0000 Encounter for general adult medical examination without abnormal findings: Secondary | ICD-10-CM

## 2022-02-26 DIAGNOSIS — Z13228 Encounter for screening for other metabolic disorders: Secondary | ICD-10-CM

## 2022-02-26 DIAGNOSIS — Z0100 Encounter for examination of eyes and vision without abnormal findings: Secondary | ICD-10-CM

## 2022-02-26 DIAGNOSIS — E785 Hyperlipidemia, unspecified: Secondary | ICD-10-CM | POA: Diagnosis not present

## 2022-02-26 DIAGNOSIS — Z1159 Encounter for screening for other viral diseases: Secondary | ICD-10-CM

## 2022-02-26 DIAGNOSIS — Z23 Encounter for immunization: Secondary | ICD-10-CM | POA: Diagnosis not present

## 2022-02-26 DIAGNOSIS — E113513 Type 2 diabetes mellitus with proliferative diabetic retinopathy with macular edema, bilateral: Secondary | ICD-10-CM

## 2022-02-26 DIAGNOSIS — E1165 Type 2 diabetes mellitus with hyperglycemia: Secondary | ICD-10-CM

## 2022-02-26 DIAGNOSIS — Z13220 Encounter for screening for lipoid disorders: Secondary | ICD-10-CM

## 2022-02-26 NOTE — Patient Instructions (Signed)

## 2022-02-26 NOTE — Progress Notes (Signed)
.  Pt presents for annual physical exam  

## 2022-02-27 ENCOUNTER — Other Ambulatory Visit: Payer: Self-pay | Admitting: Family

## 2022-02-27 DIAGNOSIS — D75839 Thrombocytosis, unspecified: Secondary | ICD-10-CM

## 2022-02-27 DIAGNOSIS — E785 Hyperlipidemia, unspecified: Secondary | ICD-10-CM

## 2022-02-27 LAB — CMP14+EGFR
ALT: 14 IU/L (ref 0–32)
AST: 12 IU/L (ref 0–40)
Albumin/Globulin Ratio: 1.5 (ref 1.2–2.2)
Albumin: 4.1 g/dL (ref 4.0–5.0)
Alkaline Phosphatase: 74 IU/L (ref 44–121)
BUN/Creatinine Ratio: 16 (ref 9–23)
BUN: 10 mg/dL (ref 6–20)
Bilirubin Total: <0.2 mg/dL (ref 0.0–1.2)
CO2: 23 mmol/L (ref 20–29)
Calcium: 9.2 mg/dL (ref 8.7–10.2)
Chloride: 104 mmol/L (ref 96–106)
Creatinine, Ser: 0.61 mg/dL (ref 0.57–1.00)
Globulin, Total: 2.8 g/dL (ref 1.5–4.5)
Glucose: 127 mg/dL — ABNORMAL HIGH (ref 70–99)
Potassium: 4.6 mmol/L (ref 3.5–5.2)
Sodium: 141 mmol/L (ref 134–144)
Total Protein: 6.9 g/dL (ref 6.0–8.5)
eGFR: 125 mL/min/{1.73_m2} (ref >59)

## 2022-02-27 LAB — TSH: TSH: 0.616 u[IU]/mL (ref 0.450–4.500)

## 2022-02-27 LAB — CBC
Hematocrit: 37.9 % (ref 34.0–46.6)
Hemoglobin: 12 g/dL (ref 11.1–15.9)
MCH: 24.7 pg — ABNORMAL LOW (ref 26.6–33.0)
MCHC: 31.7 g/dL (ref 31.5–35.7)
MCV: 78 fL — ABNORMAL LOW (ref 79–97)
Platelets: 628 10*3/uL — ABNORMAL HIGH (ref 150–450)
RBC: 4.86 x10E6/uL (ref 3.77–5.28)
RDW: 15.8 % — ABNORMAL HIGH (ref 11.7–15.4)
WBC: 9.5 10*3/uL (ref 3.4–10.8)

## 2022-02-27 LAB — HEPATITIS C ANTIBODY: Hep C Virus Ab: NONREACTIVE

## 2022-02-27 LAB — LIPID PANEL
Chol/HDL Ratio: 4.2 ratio (ref 0.0–4.4)
Cholesterol, Total: 175 mg/dL (ref 100–199)
HDL: 42 mg/dL (ref >39)
LDL Chol Calc (NIH): 103 mg/dL — ABNORMAL HIGH (ref 0–99)
Triglycerides: 171 mg/dL — ABNORMAL HIGH (ref 0–149)
VLDL Cholesterol Cal: 30 mg/dL (ref 5–40)

## 2022-02-27 MED ORDER — ATORVASTATIN CALCIUM 20 MG PO TABS: 20.0000 mg | ORAL_TABLET | Freq: Every day | ORAL | 2 refills | Status: DC

## 2022-02-28 ENCOUNTER — Telehealth: Payer: Self-pay | Admitting: Hematology and Oncology

## 2022-02-28 NOTE — Telephone Encounter (Signed)
Scheduled appt per 10/5 referral. Pt is aware of appt date and time. Pt is aware to arrive 15 mins prior to appt time and to bring and updated insurance card. Pt is aware of appt location.   

## 2022-03-18 ENCOUNTER — Inpatient Hospital Stay: Payer: Medicaid Other

## 2022-03-18 ENCOUNTER — Encounter: Payer: Self-pay | Admitting: *Deleted

## 2022-03-18 ENCOUNTER — Inpatient Hospital Stay: Payer: Medicaid Other | Attending: Hematology and Oncology | Admitting: Hematology and Oncology

## 2022-03-18 ENCOUNTER — Encounter: Payer: Self-pay | Admitting: Hematology and Oncology

## 2022-03-18 ENCOUNTER — Other Ambulatory Visit: Payer: Self-pay

## 2022-03-18 VITALS — BP 130/88 | HR 75 | Temp 97.8°F | Resp 16 | Ht 65.0 in | Wt 242.0 lb

## 2022-03-18 DIAGNOSIS — Z79899 Other long term (current) drug therapy: Secondary | ICD-10-CM | POA: Insufficient documentation

## 2022-03-18 DIAGNOSIS — D75839 Thrombocytosis, unspecified: Secondary | ICD-10-CM | POA: Diagnosis present

## 2022-03-18 DIAGNOSIS — Z800 Family history of malignant neoplasm of digestive organs: Secondary | ICD-10-CM | POA: Diagnosis not present

## 2022-03-18 DIAGNOSIS — F17210 Nicotine dependence, cigarettes, uncomplicated: Secondary | ICD-10-CM | POA: Diagnosis not present

## 2022-03-18 DIAGNOSIS — Z7984 Long term (current) use of oral hypoglycemic drugs: Secondary | ICD-10-CM | POA: Diagnosis not present

## 2022-03-18 DIAGNOSIS — Z794 Long term (current) use of insulin: Secondary | ICD-10-CM | POA: Diagnosis not present

## 2022-03-18 DIAGNOSIS — E119 Type 2 diabetes mellitus without complications: Secondary | ICD-10-CM | POA: Insufficient documentation

## 2022-03-18 LAB — CBC WITH DIFFERENTIAL/PLATELET
Abs Immature Granulocytes: 0.02 10*3/uL (ref 0.00–0.07)
Basophils Absolute: 0 10*3/uL (ref 0.0–0.1)
Basophils Relative: 0 %
Eosinophils Absolute: 0.7 10*3/uL — ABNORMAL HIGH (ref 0.0–0.5)
Eosinophils Relative: 7 %
HCT: 39.8 % (ref 36.0–46.0)
Hemoglobin: 12.8 g/dL (ref 12.0–15.0)
Immature Granulocytes: 0 %
Lymphocytes Relative: 47 %
Lymphs Abs: 4.6 10*3/uL — ABNORMAL HIGH (ref 0.7–4.0)
MCH: 25.3 pg — ABNORMAL LOW (ref 26.0–34.0)
MCHC: 32.2 g/dL (ref 30.0–36.0)
MCV: 78.7 fL — ABNORMAL LOW (ref 80.0–100.0)
Monocytes Absolute: 0.3 10*3/uL (ref 0.1–1.0)
Monocytes Relative: 3 %
Neutro Abs: 4.3 10*3/uL (ref 1.7–7.7)
Neutrophils Relative %: 43 %
Platelets: 518 10*3/uL — ABNORMAL HIGH (ref 150–400)
RBC: 5.06 MIL/uL (ref 3.87–5.11)
RDW: 16.4 % — ABNORMAL HIGH (ref 11.5–15.5)
WBC: 10 10*3/uL (ref 4.0–10.5)
nRBC: 0 % (ref 0.0–0.2)

## 2022-03-18 LAB — COMPREHENSIVE METABOLIC PANEL
ALT: 14 U/L (ref 0–44)
AST: 12 U/L — ABNORMAL LOW (ref 15–41)
Albumin: 4.2 g/dL (ref 3.5–5.0)
Alkaline Phosphatase: 69 U/L (ref 38–126)
Anion gap: 10 (ref 5–15)
BUN: 13 mg/dL (ref 6–20)
CO2: 23 mmol/L (ref 22–32)
Calcium: 9.3 mg/dL (ref 8.9–10.3)
Chloride: 107 mmol/L (ref 98–111)
Creatinine, Ser: 0.61 mg/dL (ref 0.44–1.00)
GFR, Estimated: >60 mL/min (ref >60)
Glucose, Bld: 164 mg/dL — ABNORMAL HIGH (ref 70–99)
Potassium: 3.8 mmol/L (ref 3.5–5.1)
Sodium: 140 mmol/L (ref 135–145)
Total Bilirubin: 0.4 mg/dL (ref 0.3–1.2)
Total Protein: 7.9 g/dL (ref 6.5–8.1)

## 2022-03-18 LAB — IRON AND IRON BINDING CAPACITY (CC-WL,HP ONLY)
Iron: 41 ug/dL (ref 28–170)
Saturation Ratios: 9 % — ABNORMAL LOW (ref 10.4–31.8)
TIBC: 475 ug/dL — ABNORMAL HIGH (ref 250–450)
UIBC: 434 ug/dL (ref 148–442)

## 2022-03-18 LAB — C-REACTIVE PROTEIN: CRP: 2 mg/dL — ABNORMAL HIGH (ref <1.0)

## 2022-03-18 LAB — FERRITIN: Ferritin: 16 ng/mL (ref 11–307)

## 2022-03-18 NOTE — Progress Notes (Signed)
[facility] CONSULT NOTE  Patient Care Team: Camillia Herter, NP as PCP - General (Nurse Practitioner)  CHIEF COMPLAINTS/PURPOSE OF CONSULTATION:  Thrombocytosis  ASSESSMENT & PLAN:   This is a very pleasant 26 year old female patient with chronic thrombocythemia referred to hematology for additional recommendations given worsening thrombocytosis.  Patient denies any clinical complaints except for history of type 2 diabetes and possibly hidradenitis suppurativa.  No known autoimmune disease or known iron deficiency anemia.  Physical examination today unremarkable except for obesity and nodular acneform lesions in the right axilla with some drainage.  We have reviewed her labs which shows chronically elevated platelet count with some increased recently.  We have discussed the following details about thrombocytosis.  Differential diagnosis  1. Primary thrombocytosis: Related to myeloproliferative disorders of the bone marrow especially essential thrombocytosis and CML. I would like to send for BCR-ABL as well as JAK-2 mutation analysis. Patient understands that JAK2 mutation is only present in 50% of essential thrombocytosis so the test is advantageous only if it is positive. If it is negative, it does not rule out. 2. Secondary/reactive thrombocytosis Different causes including infections, inflammation, iron deficiency.  I would like to send out for C-reactive protein, iron studies with ferritin to complete the workup. 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  We will do a televisit in a couple weeks to review her labs.  She will otherwise return to clinic in 6 months.  All her questions were answered to the best my knowledge.  Thank you for consulting [place] in the care of this patient.  Please do not hesitate to contact us with any additional questions or concerns.   HISTORY OF PRESENTING ILLNESS:   Mercedes Valdez 26 y.o. female is here because of thrombocytosis. This is a 26 year old female patient with type 2 diabetes currently on Jardiance, insulin referred to hematology for evaluation of persistent and worsening thrombocytosis.  Patient denies any health complaints.  Her last hemoglobin A1c is 8.2% and she is working on lowering it.  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  Mom complains of possible hidradenitis suppurativa since she also has it and patient happens to have recurrent boils in her axilla which frequently need antibiotics.  No other known autoimmune disease.  No known iron deficiency anemia.  She has irregular menstrual cycles since menarche, currently on menstrual cycle.  Rest of the pertinent 10 point ROS reviewed and negative  REVIEW OF SYSTEMS:   Constitutional: Denies fevers, chills or abnormal night sweats Eyes: Denies blurriness of vision, double vision or watery eyes Ears, nose, mouth, throat, and face: Denies mucositis or sore throat Respiratory: Denies cough, dyspnea or wheezes Cardiovascular: Denies palpitation, chest discomfort or lower extremity swelling Gastrointestinal:  Denies nausea, heartburn or change in bowel habits Skin: Denies abnormal skin rashes Lymphatics: Denies new lymphadenopathy or easy bruising Neurological:Denies numbness, tingling or new weaknesses Behavioral/Psych: Mood is stable, no new changes  All other systems were reviewed with the patient and are negative.  MEDICAL HISTORY:  Past Medical History:  Diagnosis Date   ADHD (attention deficit hyperactivity disorder)    Asthma    Childhood   Diabetic retinopathy ([place])    Elevated blood pressure reading without diagnosis of hypertension    Endometrial mass    History of 2019 novel coronavirus disease (COVID-19) 03/09/2020   positive result in epic,  per pt mild to moderate  symptoms that resolved   History of asthma    child   History of  gonorrhea 2016   Hypertensive retinopathy    Insulin dependent type 2 diabetes mellitus ([place])    followd by pcp---- (11-16-2020 per pt checks blood sugar at home 4-5 times daily,  fasting sugar--- 70--95)   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 See comment>>a molar pregnancy is not excluded; correlation with beta-HCG is  recommended.  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    SURGICAL HISTORY: Past Surgical History:  Procedure Laterality Date   DILATATION & CURETTAGE/HYSTEROSCOPY WITH MYOSURE N/A 11/21/2020   Procedure: [place];  Surgeon: Aletha Halim, MD;  Location: [place];  Service: Gynecology;  Laterality: N/A;   INJECTION OF SILICONE OIL Right 1/49/7026   Procedure: INJECTION OF SILICONE OIL;  Surgeon: Bernarda Caffey, MD;  Location: [place];  Service: Ophthalmology;  Laterality: Right;   KNEE ARTHROSCOPY W/ ACL RECONSTRUCTION Left 2011   MEMBRANE PEEL Right 02/07/2021   Procedure: MEMBRANE PEEL;  Surgeon: Bernarda Caffey, MD;  Location: [place];  Service: Ophthalmology;  Laterality: Right;   PARS PLANA VITRECTOMY Right 02/07/2021   Procedure: TWENTY-FIVE  GAUGE PARS PLANA VITRECTOMY;  Surgeon: Bernarda Caffey, MD;  Location: [place];  Service: Ophthalmology;  Laterality: Right;   PERFLUORONE INJECTION Right 02/07/2021   Procedure: PERFLUORON INJECTION;  Surgeon: Bernarda Caffey, MD;  Location: [place];  Service: Ophthalmology;  Laterality: Right;   PHOTOCOAGULATION WITH LASER Right 02/07/2021   Procedure: PHOTOCOAGULATION WITH LASER;  Surgeon: Bernarda Caffey, MD;  Location: [place];   Service: Ophthalmology;  Laterality: Right;   TYMPANOSTOMY TUBE PLACEMENT Bilateral    child    SOCIAL HISTORY: Social History   Socioeconomic History   Marital status: Single    Spouse name: Not on file   Number of children: Not on file   Years of education: 11   Highest education level: Not on file  Occupational History   Occupation: Biochemist, clinical    Comment: RRD  Tobacco Use   Smoking status: Every Day    Packs/day: 0.25    Years: 6.00    Total pack years: 1.50    Types: Cigarettes    Passive exposure: Current   Smokeless tobacco: Never   Tobacco comments:    11-16-2020  per pt 3 cig per day  Vaping Use   Vaping Use: Former   Devices: uses different types  Substance and Sexual Activity   Alcohol use: Yes    Comment: socially   Drug use: Not Currently    Types: Marijuana   Sexual activity: Yes    Birth control/protection: None, Injection  Other Topics Concern   Not on file  Social History Narrative   Not on file   Social Determinants of Health   Financial Resource Strain: Not on file  Food Insecurity: No Food Insecurity (01/21/2021)   Hunger Vital Sign    Worried About Running Out of Food in the Last Year: Never true    Ran Out of Food in the Last Year: Never true  Transportation Needs: No Transportation Needs (01/21/2021)  PRAPARE - Hydrologist (Medical): No    Lack of Transportation (Non-Medical): No  Physical Activity: Not on file  Stress: Not on file  Social Connections: Not on file  Intimate Partner Violence: Not on file    FAMILY HISTORY: Family History  Problem Relation Age of Onset   Diabetes Mother    Vision loss Mother    ADD / ADHD Brother    Allergies Brother    Asthma Brother    Vision loss Brother    Cancer Paternal Grandfather        Colon Cancer   Heart disease Father 2       died from MI at age 4    ALLERGIES:  is allergic to amoxicillin, augmentin [amoxicillin-pot clavulanate], penicillins,  shellfish allergy, suprax [cefixime], and latex.  MEDICATIONS:  Current Outpatient Medications  Medication Sig Dispense Refill   Accu-Chek Softclix Lancets lancets To check blood sugars 4 times a day. Fasting and 2 hours after breakfast, lunch and dinner. 100 each 8   atorvastatin (LIPITOR) 20 MG tablet Take 1 tablet (20 mg total) by mouth daily. 30 tablet 2   Blood Pressure Monitoring (BLOOD PRESSURE KIT) DEVI 1 Device by Does not apply route once a week. Please take blood pressure once a week and record in Babyscripts. 1 each 0   Continuous Blood Gluc Sensor (DEXCOM G6 SENSOR) MISC 1 Device by Does not apply route as directed. 9 each 3   Continuous Blood Gluc Transmit (DEXCOM G6 TRANSMITTER) MISC 1 Device by Does not apply route as directed. 1 each 3   empagliflozin (JARDIANCE) 25 MG TABS tablet Take 1 tablet (25 mg total) by mouth daily before breakfast. 90 tablet 2   glucose blood (ACCU-CHEK GUIDE) test strip To check blood sugars 4 times a day. Fasting, and 2 hours after Breakfast, Lunch and Dinner 100 each 8   ibuprofen (ADVIL) 800 MG tablet Take 800 mg by mouth 3 (three) times daily.     insulin aspart (NOVOLOG) 100 UNIT/ML injection Max daily 60 units per pump 60 mL 3   Insulin Disposable Pump (OMNIPOD 5 G6 INTRO, GEN 5,) KIT 1 Device by Does not apply route every other day. 1 kit 0   Insulin Disposable Pump (OMNIPOD 5 G6 POD, GEN 5,) MISC 1 Device by Does not apply route every other day. 15 each 3   Insulin Pen Needle 31G X 5 MM MISC 1 Device by Does not apply route in the morning, at noon, in the evening, and at bedtime. 400 each 2   Misc. Devices (GOJJI WEIGHT SCALE) MISC 1 Device by Does not apply route once a week. Please take weight and record in Babyscripts once a week. 1 each 0   No current facility-administered medications for this visit.     PHYSICAL EXAMINATION: ECOG PERFORMANCE STATUS: 0 - Asymptomatic  Vitals:   03/18/22 1040  BP: 130/88  Pulse: 75  Resp: 16  Temp:  97.8 F (36.6 C)  SpO2: 100%   Filed Weights   03/18/22 1040  Weight: 242 lb (109.8 kg)   Physical Exam Constitutional:      Appearance: Normal appearance.  Cardiovascular:     Rate and Rhythm: Normal rate and regular rhythm.  Pulmonary:     Effort: Pulmonary effort is normal.     Breath sounds: Normal breath sounds.  Abdominal:     General: Abdomen is flat.     Palpations: Abdomen is soft.  Musculoskeletal:  General: No swelling. Normal range of motion.     Cervical back: Normal range of motion and neck supple. No rigidity.  Lymphadenopathy:     Cervical: No cervical adenopathy.  Skin:    General: Skin is warm.  Neurological:     General: No focal deficit present.     Mental Status: She is alert.      LABORATORY DATA:  I have reviewed the data as listed Lab Results  Component Value Date   WBC 9.5 02/26/2022   HGB 12.0 02/26/2022   HCT 37.9 02/26/2022   MCV 78 (L) 02/26/2022   PLT 628 (H) 02/26/2022     Chemistry      Component Value Date/Time   NA 141 02/26/2022 1638   K 4.6 02/26/2022 1638   CL 104 02/26/2022 1638   CO2 23 02/26/2022 1638   BUN 10 02/26/2022 1638   CREATININE 0.61 02/26/2022 1638   CREATININE 0.63 04/11/2013 0819      Component Value Date/Time   CALCIUM 9.2 02/26/2022 1638   ALKPHOS 74 02/26/2022 1638   AST 12 02/26/2022 1638   ALT 14 02/26/2022 1638   BILITOT <0.2 02/26/2022 1638       RADIOGRAPHIC STUDIES: I have personally reviewed the radiological images as listed and agreed with the findings in the report. No results found.  All questions were answered. The patient knows to call the clinic with any problems, questions or concerns. I spent 45 minutes in the care of this patient including H and P, review of records, counseling and coordination of care.     Benay Pike, MD 03/18/2022 10:56 AM

## 2022-03-20 ENCOUNTER — Telehealth: Payer: Self-pay | Admitting: *Deleted

## 2022-03-20 NOTE — Telephone Encounter (Signed)
LM with note below. Requested to call Dr Rob Hickman office to confirm receipt of message

## 2022-03-20 NOTE — Telephone Encounter (Signed)
-----   Message from Laureen Abrahams, RN sent at 03/20/2022  9:46 AM EDT -----  ----- Message ----- From: Benay Pike, MD Sent: 03/19/2022   6:03 PM EDT To: Laureen Abrahams, RN  Patient appears to have mild iron deficiency. Can she start oral iron replacement ferrous sulfate 65 mg daily and RTC in December instead of early November.

## 2022-03-26 ENCOUNTER — Telehealth: Payer: Self-pay

## 2022-04-02 ENCOUNTER — Other Ambulatory Visit: Payer: Self-pay | Admitting: *Deleted

## 2022-04-02 MED ORDER — IRON-FOLATE-C-BIOT-B12-ZINC 160-1.7 MG PO TABS: 1.0000 | ORAL_TABLET | Freq: Every day | ORAL | 3 refills | Status: DC

## 2022-04-02 NOTE — Telephone Encounter (Signed)
This RN contacted pt to follow up on VM left by other RN per need for iron supplementation per MD recommendation - pt states she received the VM but is unable to afford the iron tablets OTC.  Pt has medicaid insurance - this RN asked her if ordered under prescription would she be able to receive- pt stated yes.  Prescription for iron sent per approved iron per formulary.

## 2022-04-02 NOTE — Telephone Encounter (Signed)
-----   Message from Patton Salles, RN sent at 03/20/2022 11:22 AM EDT ----- I left her this message and asked her to call us back ----- Message ----- From: Laureen Abrahams, RN Sent: 03/20/2022   9:46 AM EDT To: Patton Salles, RN   ----- Message ----- From: Benay Pike, MD Sent: 03/19/2022   6:03 PM EDT To: Laureen Abrahams, RN  Patient appears to have mild iron deficiency. Can she start oral iron replacement ferrous sulfate 65 mg daily and RTC in December instead of early November.

## 2022-04-03 ENCOUNTER — Telehealth: Payer: Medicaid Other | Admitting: Hematology and Oncology

## 2022-04-22 ENCOUNTER — Telehealth: Payer: Self-pay | Admitting: Hematology and Oncology

## 2022-04-22 NOTE — Telephone Encounter (Signed)
Called patient to r/s phone visit. Patient notified of new appt.

## 2022-04-27 ENCOUNTER — Ambulatory Visit (HOSPITAL_COMMUNITY)
Admission: EM | Admit: 2022-04-27 | Discharge: 2022-04-27 | Disposition: A | Discharge status: Home / Self Care | Payer: Medicaid Other | Attending: Emergency Medicine | Admitting: Emergency Medicine

## 2022-04-27 ENCOUNTER — Encounter (HOSPITAL_COMMUNITY): Payer: Self-pay | Admitting: Emergency Medicine

## 2022-04-27 ENCOUNTER — Other Ambulatory Visit: Payer: Self-pay

## 2022-04-27 DIAGNOSIS — J029 Acute pharyngitis, unspecified: Secondary | ICD-10-CM | POA: Insufficient documentation

## 2022-04-27 DIAGNOSIS — B349 Viral infection, unspecified: Secondary | ICD-10-CM | POA: Diagnosis present

## 2022-04-27 LAB — POCT RAPID STREP A, ED / UC: Streptococcus, Group A Screen (Direct): NEGATIVE

## 2022-04-27 MED ORDER — LIDOCAINE VISCOUS HCL 2 % MT SOLN: 15.0000 mL | OROMUCOSAL | 0 refills | Status: DC | PRN

## 2022-04-27 MED ORDER — ACETAMINOPHEN 325 MG PO TABS
650.0000 mg | ORAL_TABLET | Freq: Once | ORAL | Status: AC
  Administered 2022-04-27: 650 mg via ORAL

## 2022-04-27 MED ORDER — ACETAMINOPHEN 325 MG PO TABS
ORAL_TABLET | ORAL | Status: AC
  Filled 2022-04-27: qty 2

## 2022-04-27 NOTE — Discharge Instructions (Addendum)
Your strep test was negative. I am sending this for culture which takes about 2 days to return.  I recommend continuing ibuprofen and tylenol, alternated every 5-6 hours for pain control. You can also use the lidocaine gargle and spit to numb the throat. You can use this as much as needed.  Please monitor symptoms, go to the ED if they worsen.

## 2022-04-27 NOTE — ED Provider Notes (Signed)
[facility]    CSN: [id number] Arrival date & time: 04/27/22  1015      History   Chief Complaint Chief Complaint  Patient presents with   Generalized Body Aches    HPI SHANEQUIA KENDRICK is a 26 y.o. female.  Presents with 3 day history of sore throat, body aches Pain with swallowing 8/10 Denies fever No congestion or cough. Denies rash  Tried ibuprofen 800 mg that helped pain No known sick contacts  Past Medical History:  Diagnosis Date   ADHD (attention deficit hyperactivity disorder)    Asthma    Childhood   Diabetic retinopathy ([place])    Elevated blood pressure reading without diagnosis of hypertension    Endometrial mass    History of 2019 novel coronavirus disease (COVID-19) 03/09/2020   positive result in epic,  per pt mild to moderate symptoms that resolved   History of asthma    child   History of gonorrhea 2016   Hypertensive retinopathy    Insulin dependent type 2 diabetes mellitus ([place])    followd by pcp---- (11-16-2020 per pt checks blood sugar at home 4-5 times daily,  fasting sugar--- 70--95)   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 See comment>>a molar pregnancy is not excluded; correlation with beta-HCG is  recommended.  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    Patient Active Problem List   Diagnosis Date Noted   Dyslipidemia 05/09/2021   Type 2 diabetes mellitus with both eyes affected by proliferative retinopathy and macular edema, with long-term current use of insulin ([place]) 05/09/2021   History of herpes simplex infection 10/08/2020   Psychosocial stressors 09/26/2014    Insulin dependent type 2 diabetes mellitus ([place]) 09/06/2014   Multiple drug allergies 02/27/2014   Musculoskeletal pain 04/08/2013   Unspecified gastritis and gastroduodenitis without mention of hemorrhage 04/08/2013   Type 2 diabetes mellitus ([place]) 04/08/2013   Irregular menses 04/08/2013   Dysfunctional uterine bleeding 11/09/2012   PCOS (polycystic ovarian syndrome) 09/29/2012   Asthma     Past Surgical History:  Procedure Laterality Date   DILATATION & CURETTAGE/HYSTEROSCOPY WITH MYOSURE N/A 11/21/2020   Procedure: [place];  Surgeon: Aletha Halim, MD;  Location: [place];  Service: Gynecology;  Laterality: N/A;   INJECTION OF SILICONE OIL Right 9/62/2297   Procedure: INJECTION OF SILICONE OIL;  Surgeon: Bernarda Caffey, MD;  Location: [place];  Service: Ophthalmology;  Laterality: Right;   KNEE ARTHROSCOPY W/ ACL RECONSTRUCTION Left 2011   MEMBRANE PEEL Right 02/07/2021   Procedure: MEMBRANE PEEL;  Surgeon: Bernarda Caffey, MD;  Location: [place];  Service: Ophthalmology;  Laterality: Right;   PARS PLANA VITRECTOMY Right 02/07/2021   Procedure: TWENTY-FIVE  GAUGE PARS PLANA VITRECTOMY;  Surgeon: Bernarda Caffey, MD;  Location: [place];  Service: Ophthalmology;  Laterality: Right;   PERFLUORONE INJECTION Right 02/07/2021   Procedure: PERFLUORON INJECTION;  Surgeon: Bernarda Caffey, MD;  Location: [place];  Service: Ophthalmology;  Laterality: Right;   PHOTOCOAGULATION WITH LASER Right 02/07/2021   Procedure: PHOTOCOAGULATION WITH LASER;  Surgeon: Bernarda Caffey, MD;  Location: [place];  Service: Ophthalmology;  Laterality: Right;   TYMPANOSTOMY TUBE PLACEMENT Bilateral    child    OB History     Gravida  1   Para      Term      Preterm      AB  1   Living         SAB  0   IAB      Ectopic      Multiple      Live Births           Obstetric Comments  G1: possible  molar pregnancy at 5wks based on path, SAB           Home Medications    Prior to Admission medications   Medication Sig Start Date End Date Taking? Authorizing Provider  Iron-Folate-C-Biot-B12-Zinc 160-1.7 MG TABS Take 1 tablet by mouth daily. 04/02/22   Benay Pike, MD  lidocaine (XYLOCAINE) 2 % solution Use as directed 15 mLs in the mouth or throat every 3 (three) hours as needed for mouth pain. 04/27/22  Yes Oaklie Durrett, Wells Guiles, PA-C  Accu-Chek Softclix Lancets lancets To check blood sugars 4 times a day. Fasting and 2 hours after breakfast, lunch and dinner. 02/25/21   Camillia Herter, NP  atorvastatin (LIPITOR) 20 MG tablet Take 1 tablet (20 mg total) by mouth daily. 02/27/22 05/28/22  Camillia Herter, NP  Blood Pressure Monitoring (BLOOD PRESSURE KIT) DEVI 1 Device by Does not apply route once a week. Please take blood pressure once a week and record in Babyscripts. 10/08/20   Clarnce Flock, MD  Continuous Blood Gluc Sensor (DEXCOM G6 SENSOR) MISC 1 Device by Does not apply route as directed. 12/11/21   Shamleffer, Melanie Crazier, MD  Continuous Blood Gluc Transmit (DEXCOM G6 TRANSMITTER) MISC 1 Device by Does not apply route as directed. 12/11/21   Shamleffer, Melanie Crazier, MD  empagliflozin (JARDIANCE) 25 MG TABS tablet Take 1 tablet (25 mg total) by mouth daily before breakfast. 08/08/21   Shamleffer, Melanie Crazier, MD  glucose blood (ACCU-CHEK GUIDE) test strip To check blood sugars 4 times a day. Fasting, and 2 hours after Breakfast, Lunch and Dinner 02/25/21   Camillia Herter, NP  ibuprofen (ADVIL) 800 MG tablet Take 800 mg by mouth 3 (three) times daily. 04/22/21   [provider]  insulin aspart (NOVOLOG) 100 UNIT/ML injection Max daily 60 units per pump 01/15/22   Shamleffer, Melanie Crazier, MD  Insulin Disposable Pump (OMNIPOD 5 G6 INTRO, GEN 5,) KIT 1 Device by Does not apply route every other day. 01/09/22   Shamleffer, Melanie Crazier, MD  Insulin Disposable Pump (OMNIPOD 5 G6 POD, GEN 5,) MISC 1 Device by Does not  apply route every other day. 01/09/22   Shamleffer, Melanie Crazier, MD  Insulin Pen Needle 31G X 5 MM MISC 1 Device by Does not apply route in the morning, at noon, in the evening, and at bedtime. 05/08/21   Shamleffer, Melanie Crazier, MD  Misc. Devices (GOJJI WEIGHT SCALE) MISC 1 Device by Does not apply route once a week. Please take weight and record in Babyscripts once a week. 10/08/20   Clarnce Flock, MD    Family History Family History  Problem Relation Age of Onset   Diabetes Mother    Vision loss Mother    ADD / ADHD Brother    Allergies Brother    Asthma Brother    Vision loss Brother    Cancer Paternal Grandfather  Colon Cancer   Heart disease Father 76       died from MI at age 84    Social History Social History   Tobacco Use   Smoking status: Every Day    Packs/day: 0.25    Years: 6.00    Total pack years: 1.50    Types: Cigarettes    Passive exposure: Current   Smokeless tobacco: Never   Tobacco comments:    11-16-2020  per pt 3 cig per day  Vaping Use   Vaping Use: Former   Devices: uses different types  Substance Use Topics   Alcohol use: Yes    Comment: socially   Drug use: Not Currently    Types: Marijuana     Allergies   Amoxicillin, Augmentin [amoxicillin-pot clavulanate], Penicillins, Shellfish allergy, Suprax [cefixime], and Latex   Review of Systems Review of Systems  As per HPI  Physical Exam Triage Vital Signs ED Triage Vitals  Enc Vitals Group     BP 04/27/22 1102 138/86     Pulse Rate 04/27/22 1102 95     Resp 04/27/22 1102 20     Temp 04/27/22 1102 98.5 F (36.9 C)     Temp Source 04/27/22 1102 Oral     SpO2 04/27/22 1102 96 %     Weight --      Height --      Head Circumference --      Peak Flow --      Pain Score 04/27/22 1058 8     Pain Loc --      Pain Edu? --      Excl. in [place]? --    No data found.  Updated Vital Signs BP 138/86 (BP Location: Left Arm) Comment (BP Location): large cuff  Pulse 95    Temp 98.5 F (36.9 C) (Oral)   Resp 20   LMP 04/18/2022   SpO2 96%   Physical Exam Vitals and nursing note reviewed.  Constitutional:      General: She is not in acute distress.    Appearance: She is not ill-appearing.  HENT:     Mouth/Throat:     Mouth: Mucous membranes are moist.     Pharynx: Oropharynx is clear. Uvula midline. Posterior oropharyngeal erythema present. No uvula swelling.     Tonsils: Tonsillar exudate present. No tonsillar abscesses. 3+ on the right. 3+ on the left.     Comments: No signs of PTA. Tolerates secretions, normal phonation  Eyes:     Conjunctiva/sclera: Conjunctivae normal.  Cardiovascular:     Rate and Rhythm: Normal rate and regular rhythm.     Pulses: Normal pulses.     Heart sounds: Normal heart sounds.  Pulmonary:     Effort: Pulmonary effort is normal.     Breath sounds: Normal breath sounds.  Musculoskeletal:     Cervical back: Normal range of motion. No rigidity.  Lymphadenopathy:     Cervical: Cervical adenopathy present.  Skin:    General: Skin is warm and dry.  Neurological:     Mental Status: She is alert and oriented to person, place, and time.      UC Treatments / Results  Labs (all labs ordered are listed, but only abnormal results are displayed) Labs Reviewed  CULTURE, GROUP A STREP [facility])  POCT RAPID STREP A, ED / UC    EKG  Radiology No results found.  Procedures Procedures (including critical care time)  Medications Ordered in UC Medications  acetaminophen (TYLENOL)  tablet 650 mg (650 mg Oral Given 04/27/22 1156)    Initial Impression / Assessment and Plan / UC Course  I have reviewed the triage vital signs and the nursing notes.  Pertinent labs & imaging results that were available during my care of the patient were reviewed by me and considered in my medical decision making (see chart for details).  Tylenol dose given for pain Strep test negative. Culture pending. Discussed symptomatic care,  alternating tylenol and ibuprofen for pain control. Can try lidocaine gargle and spit. Discussed may take a few more days to improve and clear, but should monitor for worsening symptoms. Return precautions discussed. Patient agrees to plan  Final Clinical Impressions(s) / UC Diagnoses   Final diagnoses:  Viral pharyngitis  Viral illness     Discharge Instructions      Your strep test was negative. I am sending this for culture which takes about 2 days to return.  I recommend continuing ibuprofen and tylenol, alternated every 5-6 hours for pain control. You can also use the lidocaine gargle and spit to numb the throat. You can use this as much as needed.  Please monitor symptoms, go to the ED if they worsen.     ED Prescriptions     Medication Sig Dispense Auth. Provider   lidocaine (XYLOCAINE) 2 % solution Use as directed 15 mLs in the mouth or throat every 3 (three) hours as needed for mouth pain. 100 mL Mayeli Bornhorst, Wells Guiles, PA-C      PDMP not reviewed this encounter.   Quinnie Barcelo, Wells Guiles, [place] 04/27/22 1201

## 2022-04-27 NOTE — ED Triage Notes (Signed)
Symptoms started Thursday night.  Complains of headache, sore throat, general body aches.    Patient has taken ibuprofen for symptoms

## 2022-04-29 ENCOUNTER — Ambulatory Visit (INDEPENDENT_AMBULATORY_CARE_PROVIDER_SITE_OTHER): Payer: Medicaid Other | Admitting: Internal Medicine

## 2022-04-29 ENCOUNTER — Encounter: Payer: Self-pay | Admitting: Internal Medicine

## 2022-04-29 VITALS — BP 120/80 | HR 78 | Ht 65.0 in | Wt 246.0 lb

## 2022-04-29 DIAGNOSIS — E1165 Type 2 diabetes mellitus with hyperglycemia: Secondary | ICD-10-CM

## 2022-04-29 DIAGNOSIS — Z794 Long term (current) use of insulin: Secondary | ICD-10-CM

## 2022-04-29 LAB — CULTURE, GROUP A STREP (THRC)

## 2022-04-29 LAB — POCT GLYCOSYLATED HEMOGLOBIN (HGB A1C): Hemoglobin A1C: 8.3 % — AB (ref 4.0–5.6)

## 2022-04-29 MED ORDER — INSULIN ASPART 100 UNIT/ML IJ SOLN: INTRAMUSCULAR | 3 refills | Status: DC

## 2022-04-29 MED ORDER — TRULICITY 0.75 MG/0.5ML ~~LOC~~ SOAJ: 0.7500 mg | SUBCUTANEOUS | 3 refills | Status: DC

## 2022-04-29 MED ORDER — EMPAGLIFLOZIN 25 MG PO TABS: 25.0000 mg | ORAL_TABLET | Freq: Every day | ORAL | 3 refills | Status: DC

## 2022-04-29 NOTE — Patient Instructions (Addendum)
Start Trulicity 1.61 mg weekly  Continue Jardiance 25 mg daily    HOW TO TREAT LOW BLOOD SUGARS (Blood sugar LESS THAN 70 MG/DL) Please follow the RULE OF 15 for the treatment of hypoglycemia treatment (when your (blood sugars are less than 70 mg/dL)   STEP 1: Take 15 grams of carbohydrates when your blood sugar is low, which includes:  3-4 GLUCOSE TABS  OR 3-4 OZ OF JUICE OR REGULAR SODA OR ONE TUBE OF GLUCOSE GEL    STEP 2: RECHECK blood sugar in 15 MINUTES STEP 3: If your blood sugar is still low at the 15 minute recheck --> then, go back to STEP 1 and treat AGAIN with another 15 grams of carbohydrates.

## 2022-04-29 NOTE — Progress Notes (Signed)
Name: Mercedes Valdez  MRN/ DOB: [id number], 1995-10-13   Age/ Sex: 26 y.o., female    PCP: Mercedes Herter, NP   Reason for Endocrinology Evaluation: Type 2 Diabetes Mellitus     Date of Initial Endocrinology Visit: 05/08/2021    PATIENT IDENTIFIER: [name] is a 26 y.o. female with a past medical history of T2DM, HTN. The patient presented for initial endocrinology clinic visit on 05/08/2021  for consultative assistance with her diabetes management.    HPI: Mercedes Valdez was    Diagnosed with DM at age 37 Prior Medications tried/Intolerance: Glipizide, Trulicity - cost prohibitive               Hemoglobin A1c has ranged from 13.2% in 08/2020, peaking at 14.8% in 02/2021.   She had a miscarriage in 09/2020  On her initial visit A1c 11.9%, adjusted MDI, stopped Glimepiride and started Jardiance.    Started on OmniPod in August 2023  SUBJECTIVE:   During the last visit (12/11/2021): A1c 8.2%    Today (04/29/22): Mercedes Valdez is here for a follow up on diabetes management. She  checks her blood sugars multiple  times daily, through CGM. The patient has not  had hypoglycemic episodes since the last clinic visit.    She denies nausea, vomiting or diarrhea  She continues with ophthalmology monthly injections  She is interested in GLP-1 agonist  This patient with type 2 diabetes is treated with OmniPod (insulin pump). During the visit the pump basal and bolus doses were reviewed including carb/insulin rations and supplemental doses. The clinical list was updated. The glucose meter download was reviewed in detail to determine if the current pump settings are providing the best glycemic control without excessive hypoglycemia.  Pump and meter download:    Pump   OmniPod Settings   Insulin type   NovoLog   Basal rate       0000 1.3 u/h               I:C ratio       0000- 1:1                  Sensitivity       0000  30      Goal       0000  110              Type & Model of Pump: OmniPod Insulin Type: Currently using NovoLog.  Body mass index is 40.94 kg/m.  PUMP STATISTICS: Average BG: 172  Average Daily Carbs (g): 29.5  Average Total Daily Insulin: 43.5 Average Daily Basal: 28.1 (65 %) Average Daily Bolus: 15.4 (35 %)      HOME DIABETES REGIMEN: Jardiance 25 mg daily  Novolog      Statin: no ACE-I/ARB: no Prior Diabetic Education:  yes    CONTINUOUS GLUCOSE MONITORING RECORD INTERPRETATION    Dates of Recording: 11/22-12/09/2021  Sensor description:dexcom   Results statistics:   CGM use % of time 46.3  Average and SD 172/48  Time in range 64%  % Time Above 180 27  % Time above 250 9  % Time Below target 0      Glycemic patterns summary: BG's trend down overnight, increase during the day   Hyperglycemic episodes  postprandial   Hypoglycemic episodes occurred n/a  Overnight periods: trend down       DIABETIC COMPLICATIONS: Microvascular complications:  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 Denies: CKD  Last eye exam: Completed 11/20/2021  Macrovascular complications:   Denies: CAD, PVD, CVA   PAST HISTORY: Past Medical History:  Past Medical History:  Diagnosis Date   ADHD (attention deficit hyperactivity disorder)    Asthma    Childhood   Diabetic retinopathy ([place])    Elevated blood pressure reading without diagnosis of hypertension    Endometrial mass    History of 2019 novel coronavirus disease (COVID-19) 03/09/2020   positive result in epic,  per pt mild to moderate symptoms that resolved   History of asthma    child   History of gonorrhea 2016   Hypertensive retinopathy    Insulin dependent type 2 diabetes mellitus ([place])    followd by pcp---- (11-16-2020 per pt checks blood sugar at home 4-5 times daily,  fasting sugar--- 70--95)   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 See comment>>a molar pregnancy is not excluded; correlation with beta-HCG is  recommended.  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   Past Surgical History:  Past Surgical History:  Procedure Laterality Date   DILATATION & CURETTAGE/HYSTEROSCOPY WITH MYOSURE N/A 11/21/2020   Procedure: [address];  Surgeon: Aletha Halim, MD;  Location: [place];  Service: Gynecology;  Laterality: N/A;   INJECTION OF SILICONE OIL Right 3/33/8329   Procedure: INJECTION OF SILICONE OIL;  Surgeon: Bernarda Caffey, MD;  Location: [place];  Service: Ophthalmology;  Laterality: Right;   KNEE ARTHROSCOPY W/ ACL RECONSTRUCTION Left 2011   MEMBRANE PEEL Right 02/07/2021   Procedure: MEMBRANE PEEL;  Surgeon: Bernarda Caffey, MD;  Location: [place];  Service: Ophthalmology;  Laterality: Right;   PARS PLANA VITRECTOMY Right 02/07/2021   Procedure: TWENTY-FIVE  GAUGE PARS PLANA VITRECTOMY;  Surgeon: Bernarda Caffey, MD;  Location: [place];  Service: Ophthalmology;  Laterality: Right;   PERFLUORONE INJECTION Right 02/07/2021   Procedure: PERFLUORON INJECTION;  Surgeon: Bernarda Caffey, MD;  Location: [place];  Service: Ophthalmology;  Laterality: Right;   PHOTOCOAGULATION WITH LASER Right 02/07/2021   Procedure: PHOTOCOAGULATION WITH LASER;  Surgeon: Bernarda Caffey, MD;  Location: [place];  Service: Ophthalmology;  Laterality: Right;   TYMPANOSTOMY TUBE PLACEMENT Bilateral    child    Social History:  reports that she has been smoking cigarettes. She has a 1.50 pack-year smoking history. She has been exposed to tobacco smoke. She has never used smokeless tobacco. She reports current alcohol use. She reports that she  does not currently use drugs after having used the following drugs: Marijuana. Family History:  Family History  Problem Relation Age of Onset   Diabetes Mother    Vision loss Mother    ADD / ADHD Brother    Allergies Brother    Asthma Brother    Vision loss Brother    Cancer Paternal Grandfather        Colon Cancer   Heart disease Father 74       died from MI at age 37     HOME MEDICATIONS: Allergies as of 04/29/2022       Reactions   Amoxicillin Hives   Augmentin [amoxicillin-pot Clavulanate] Hives   Penicillins  Hives   Has patient had a PCN reaction causing immediate rash, facial/tongue/throat swelling, SOB or lightheadedness with hypotension: No Has patient had a PCN reaction causing severe rash involving mucus membranes or skin necrosis: No Has patient had a PCN reaction that required hospitalization No Has patient had a PCN reaction occurring within the last 10 years: No If all of the above answers are "NO", then may proceed with Cephalosporin use.   Shellfish Allergy Swelling, Other (See Comments)   Itching lips   Suprax [cefixime]    Pt unsure reaction, may have been childhood reaction   Latex Rash        Medication List        Accurate as of April 29, 2022 11:01 AM. If you have any questions, ask your nurse or doctor.          Accu-Chek Guide test strip Generic drug: glucose blood To check blood sugars 4 times a day. Fasting, and 2 hours after Breakfast, Lunch and Dinner   Accu-Chek Softclix Lancets lancets To check blood sugars 4 times a day. Fasting and 2 hours after breakfast, lunch and dinner.   atorvastatin 20 MG tablet Commonly known as: LIPITOR Take 1 tablet (20 mg total) by mouth daily.   Blood Pressure Kit Devi 1 Device by Does not apply route once a week. Please take blood pressure once a week and record in [employer].   Dexcom G6 Sensor Misc 1 Device by Does not apply route as directed.   Dexcom G6 Transmitter Misc 1 Device by  Does not apply route as directed.   empagliflozin 25 MG Tabs tablet Commonly known as: Jardiance Take 1 tablet (25 mg total) by mouth daily before breakfast.   Gojji Weight Scale Misc 1 Device by Does not apply route once a week. Please take weight and record in Babyscripts once a week.   ibuprofen 800 MG tablet Commonly known as: ADVIL Take 800 mg by mouth 3 (three) times daily.   insulin aspart 100 UNIT/ML injection Commonly known as: NovoLOG Max daily 60 units per pump   Insulin Pen Needle 31G X 5 MM Misc 1 Device by Does not apply route in the morning, at noon, in the evening, and at bedtime.   Iron-Folate-C-Biot-B12-Zinc 160-1.7 MG Tabs Take 1 tablet by mouth daily.   lidocaine 2 % solution Commonly known as: XYLOCAINE Use as directed 15 mLs in the mouth or throat every 3 (three) hours as needed for mouth pain.   Omnipod 5 G6 Pod (Gen 5) Misc 1 Device by Does not apply route every other day.   Omnipod 5 G6 Intro (Gen 5) Kit 1 Device by Does not apply route every other day.         ALLERGIES: Allergies  Allergen Reactions   Amoxicillin Hives   Augmentin [Amoxicillin-Pot Clavulanate] Hives   Penicillins Hives    Has patient had a PCN reaction causing immediate rash, facial/tongue/throat swelling, SOB or lightheadedness with hypotension: No Has patient had a PCN reaction causing severe rash involving mucus membranes or skin necrosis: No Has patient had a PCN reaction that required hospitalization No Has patient had a PCN reaction occurring within the last 10 years: No If all of the above answers are "NO", then may proceed with Cephalosporin use.   Shellfish Allergy Swelling and Other (See Comments)    Itching lips   Suprax [Cefixime]     Pt unsure reaction, may have been childhood reaction   Latex Rash  OBJECTIVE:   VITAL SIGNS: BP 120/80 (BP Location: Left Arm, Patient Position: Sitting, Cuff Size: Large)   Pulse 78   Ht _0  (1.651 m)   Wt 246  lb (111.6 kg)   LMP 04/18/2022   SpO2 95%   BMI 40.94 kg/m    PHYSICAL EXAM:  General: Pt appears well and is in NAD  Neck: General: Supple without adenopathy or carotid bruits. Thyroid: Thyroid size normal.  No goiter or nodules appreciated.   Lungs: Clear with good BS bilat   Heart: RRR   Abdomen: Normoactive bowel sounds, soft, nontender, without masses or organomegaly palpable  Extremities:  Lower extremities - No pretibial edema.   Neuro: MS is good with appropriate affect, pt is alert and Ox3   DM Foot Exam 12/11/2021 The skin of the feet is intact without sores or ulcerations. The pedal pulses are 2+ on right and 2+ on left. The sensation is intact to a screening 5.07, 10 gram monofilament bilaterally   DATA REVIEWED:  Lab Results  Component Value Date   HGBA1C 8.3 (A) 04/29/2022   HGBA1C 8.2 (A) 12/11/2021   HGBA1C 10.7 (A) 08/08/2021    Latest Reference Range & Units 03/18/22 11:55  Sodium 135 - 145 mmol/L 140  Potassium 3.5 - 5.1 mmol/L 3.8  Chloride 98 - 111 mmol/L 107  CO2 22 - 32 mmol/L 23  Glucose 70 - 99 mg/dL 164 (H)  BUN 6 - 20 mg/dL 13  Creatinine 0.44 - 1.00 mg/dL 0.61  Calcium 8.9 - 10.3 mg/dL 9.3  Anion gap 5 - 15  10  Alkaline Phosphatase 38 - 126 U/L 69  Albumin 3.5 - 5.0 g/dL 4.2  AST 15 - 41 U/L 12 (L)  ALT 0 - 44 U/L 14  Total Protein 6.5 - 8.1 g/dL 7.9  Total Bilirubin 0.3 - 1.2 mg/dL 0.4  GFR, Estimated >60 mL/min >60     ASSESSMENT / PLAN / RECOMMENDATIONS:   1) Type 2 Diabetes Mellitus, poorly controlled, With retinopathic complications - Most recent A1c of 8.3%. Goal A1c <7.0%.     - A1c stable but continues to be above goal  - Pt does not count CHO, she was advised to ENTER #14 grams with each meal from now on  - She is interested in GLP-1 agonist , will restart Trulicity as below  - As far as the pump , I have increased her basal rate and change her SF from 30 to 25  Pump   OmniPod Settings   Insulin type   NovoLog   Basal  rate       0000 1.4 u/h               I:C ratio       0000- 1:1                  Sensitivity       0000  25      Goal       0000  110        MEDICATIONS: Continue Jardiance 25 mg daily  Start Trulicity 4.16 mg weekly  Continue Novolog per pump     EDUCATION / INSTRUCTIONS: BG monitoring instructions: Patient is instructed to check her blood sugars 3 times a day, before meals . Call [facility] if: BG persistently < 70  I reviewed the Rule of 15 for the treatment of hypoglycemia in detail with the patient. Literature supplied.   2) Diabetic complications:  Eye: Does  have known diabetic retinopathy.  Neuro/ Feet: Does not have known diabetic peripheral neuropathy. Renal: Patient does not have known baseline CKD. She is not on an ACEI/ARB at present.    3) Dyslipidemia :   -TG and LDL are trending down but continue to be above goal.   -No indication for statin therapy at this time    Follow-up in 4 months   Signed electronically by: Mack Guise, MD  [facility]  [facility] [address] [place], [place] [address] Phone: [phone] FAX: [phone]   CC: Mercedes Herter, NP [place] [address] Phone: [phone]  Fax: [phone]    Return to Endocrinology clinic as below: Future Appointments  Date Time Provider [place]  05/14/2022  2:45 PM [facility] None  05/15/2022  3:30 PM Benay Pike, MD [facility] None  09/17/2022 11:30 AM Benay Pike, MD [facility] None

## 2022-05-02 ENCOUNTER — Other Ambulatory Visit (HOSPITAL_COMMUNITY): Payer: Self-pay

## 2022-05-13 ENCOUNTER — Telehealth: Payer: Self-pay

## 2022-05-13 ENCOUNTER — Other Ambulatory Visit (HOSPITAL_COMMUNITY): Payer: Self-pay

## 2022-05-13 NOTE — Telephone Encounter (Signed)
Pharmacy Patient Advocate Encounter  Prior Authorization for TRULICITY .'75MG'$ /.5ML  has been approved.    PA# [id number] Effective dates: 05/13/22 through 05/13/23  Karie Soda, [place] Patient Advocate Specialist Direct Number: [phone] Fax: [phone]

## 2022-05-13 NOTE — Telephone Encounter (Signed)
Pharmacy Patient Advocate Encounter   Received notification from [place] that prior authorization for [place] .'75MG'$ /.5ML is needed.    PA submitted on 05/13/22 Key [id number] Status is pending  Karie Soda, [place] Patient Advocate Specialist Direct Number: [phone] Fax: [phone]

## 2022-05-14 ENCOUNTER — Inpatient Hospital Stay: Payer: Medicaid Other | Attending: Hematology and Oncology

## 2022-05-14 ENCOUNTER — Other Ambulatory Visit: Payer: Self-pay

## 2022-05-14 ENCOUNTER — Other Ambulatory Visit: Payer: Self-pay | Admitting: Family

## 2022-05-14 DIAGNOSIS — D75839 Thrombocytosis, unspecified: Secondary | ICD-10-CM | POA: Diagnosis present

## 2022-05-14 DIAGNOSIS — Z79899 Other long term (current) drug therapy: Secondary | ICD-10-CM | POA: Insufficient documentation

## 2022-05-14 DIAGNOSIS — E785 Hyperlipidemia, unspecified: Secondary | ICD-10-CM

## 2022-05-14 LAB — CMP (CANCER CENTER ONLY)
ALT: 16 U/L (ref 0–44)
AST: 15 U/L (ref 15–41)
Albumin: 3.9 g/dL (ref 3.5–5.0)
Alkaline Phosphatase: 66 U/L (ref 38–126)
Anion gap: 9 (ref 5–15)
BUN: 15 mg/dL (ref 6–20)
CO2: 23 mmol/L (ref 22–32)
Calcium: 9 mg/dL (ref 8.9–10.3)
Chloride: 108 mmol/L (ref 98–111)
Creatinine: 0.68 mg/dL (ref 0.44–1.00)
GFR, Estimated: >60 mL/min (ref >60)
Glucose, Bld: 136 mg/dL — ABNORMAL HIGH (ref 70–99)
Potassium: 3.6 mmol/L (ref 3.5–5.1)
Sodium: 140 mmol/L (ref 135–145)
Total Bilirubin: 0.4 mg/dL (ref 0.3–1.2)
Total Protein: 7.8 g/dL (ref 6.5–8.1)

## 2022-05-14 LAB — CBC WITH DIFFERENTIAL/PLATELET
Abs Immature Granulocytes: 0.02 10*3/uL (ref 0.00–0.07)
Basophils Absolute: 0 10*3/uL (ref 0.0–0.1)
Basophils Relative: 0 %
Eosinophils Absolute: 0.4 10*3/uL (ref 0.0–0.5)
Eosinophils Relative: 4 %
HCT: 37.8 % (ref 36.0–46.0)
Hemoglobin: 12.1 g/dL (ref 12.0–15.0)
Immature Granulocytes: 0 %
Lymphocytes Relative: 56 %
Lymphs Abs: 5.5 10*3/uL — ABNORMAL HIGH (ref 0.7–4.0)
MCH: 25.5 pg — ABNORMAL LOW (ref 26.0–34.0)
MCHC: 32 g/dL (ref 30.0–36.0)
MCV: 79.6 fL — ABNORMAL LOW (ref 80.0–100.0)
Monocytes Absolute: 0.4 10*3/uL (ref 0.1–1.0)
Monocytes Relative: 4 %
Neutro Abs: 3.5 10*3/uL (ref 1.7–7.7)
Neutrophils Relative %: 36 %
Platelets: 612 10*3/uL — ABNORMAL HIGH (ref 150–400)
RBC: 4.75 MIL/uL (ref 3.87–5.11)
RDW: 15.9 % — ABNORMAL HIGH (ref 11.5–15.5)
Smear Review: NORMAL
WBC: 9.7 10*3/uL (ref 4.0–10.5)
nRBC: 0 % (ref 0.0–0.2)

## 2022-05-15 ENCOUNTER — Other Ambulatory Visit: Payer: Self-pay | Admitting: *Deleted

## 2022-05-15 ENCOUNTER — Inpatient Hospital Stay (HOSPITAL_BASED_OUTPATIENT_CLINIC_OR_DEPARTMENT_OTHER): Payer: Medicaid Other | Admitting: Hematology and Oncology

## 2022-05-15 ENCOUNTER — Encounter: Payer: Self-pay | Admitting: Hematology and Oncology

## 2022-05-15 DIAGNOSIS — D75839 Thrombocytosis, unspecified: Secondary | ICD-10-CM

## 2022-05-15 NOTE — Progress Notes (Signed)
[facility] CONSULT NOTE  Patient Care Team: Camillia Herter, NP as PCP - General (Nurse Practitioner)  CHIEF COMPLAINTS/PURPOSE OF CONSULTATION:  Thrombocytosis  ASSESSMENT & PLAN:   This is a very pleasant 26 year old female patient with chronic thrombocythemia referred to hematology for additional recommendations given worsening thrombocytosis.   She is here for telephone visit today to review labs.   Labs show ongoing thrombocytosis, mildly worsened hence have recommended proceeding with JAK 2 mutation analysis today. She will come next week for the lab and return to clinic to follow-up with me in 3 to 4 weeks.  In the past and today has been discussed that this could indeed be secondary thrombocytosis from her anxious.  There is no role for anticoagulation secondary thrombocytosis.  She will return to the clinic as recommended. All her questions were answered to the best my knowledge.  Thank you for consulting [place] in the care of this patient.  Please do not hesitate to contact us with any additional questions or concerns.   HISTORY OF PRESENTING ILLNESS:  Mercedes Valdez 26 y.o. female is here because of thrombocytosis. This is a 26 year old female patient with type 2 diabetes currently on Jardiance, insulin referred to hematology for evaluation of persistent and worsening thrombocytosis.  Patient denies any health complaints.  Her last hemoglobin A1c is 8.2% and she is working on lowering it.  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  Mom complains of possible hidradenitis suppurativa since she also has it and patient happens to have recurrent boils in her axilla which frequently need antibiotics.  No other known autoimmune disease.  No known iron deficiency anemia.   She is here for follow-up today.  Since her last visit here, she is here for telephone visit. She complains of generalized bodyaches as well as some left-sided chest pain  which last for 3 to 4 hours.  She denies any clear trigger factors.  No shortness of breath reported.  REVIEW OF SYSTEMS:   Constitutional: Denies fevers, chills or abnormal night sweats Eyes: Denies blurriness of vision, double vision or watery eyes Ears, nose, mouth, throat, and face: Denies mucositis or sore throat Respiratory: Denies cough, dyspnea or wheezes Cardiovascular: Denies palpitation, chest discomfort or lower extremity swelling Gastrointestinal:  Denies nausea, heartburn or change in bowel habits Skin: Denies abnormal skin rashes Lymphatics: Denies new lymphadenopathy or easy bruising Neurological:Denies numbness, tingling or new weaknesses Behavioral/Psych: Mood is stable, no new changes  All other systems were reviewed with the patient and are negative.  MEDICAL HISTORY:  Past Medical History:  Diagnosis Date   ADHD (attention deficit hyperactivity disorder)    Asthma    Childhood   Diabetic retinopathy ([place])    Elevated blood pressure reading without diagnosis of hypertension    Endometrial mass    History of 2019 novel coronavirus disease (COVID-19) 03/09/2020   positive result in epic,  per pt mild to moderate symptoms that resolved   History of asthma    child   History of gonorrhea 2016   Hypertensive retinopathy    Insulin dependent type 2 diabetes mellitus ([place])    followd by pcp---- (11-16-2020 per pt checks blood sugar at home 4-5 times daily,  fasting sugar--- 70--95)   Molar pregnancy 11/02/2020   Questionable>being treated as such  Treatment course 12/2020: quant<1 6/29: quant <1 6/17: quant <1 6/10: quant 1 6/10: depo provera given 5/22: suction d&c>hydropic villi with polar trophoblastic  hyperplasia. See comment>>a molar pregnancy is not excluded; correlation with beta-HCG is  recommended.  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    SURGICAL HISTORY: Past Surgical History:  Procedure Laterality Date   DILATATION & CURETTAGE/HYSTEROSCOPY WITH MYOSURE N/A 11/21/2020   Procedure: [place];  Surgeon: Aletha Halim, MD;  Location: [facility];  Service: Gynecology;  Laterality: N/A;   INJECTION OF SILICONE OIL Right 2/44/0102   Procedure: INJECTION OF SILICONE OIL;  Surgeon: Bernarda Caffey, MD;  Location: [facility];  Service: Ophthalmology;  Laterality: Right;   KNEE ARTHROSCOPY W/ ACL RECONSTRUCTION Left 2011   MEMBRANE PEEL Right 02/07/2021   Procedure: MEMBRANE PEEL;  Surgeon: Bernarda Caffey, MD;  Location: [facility];  Service: Ophthalmology;  Laterality: Right;   PARS PLANA VITRECTOMY Right 02/07/2021   Procedure: TWENTY-FIVE  GAUGE PARS PLANA VITRECTOMY;  Surgeon: Bernarda Caffey, MD;  Location: [facility];  Service: Ophthalmology;  Laterality: Right;   PERFLUORONE INJECTION Right 02/07/2021   Procedure: PERFLUORON INJECTION;  Surgeon: Bernarda Caffey, MD;  Location: [facility];  Service: Ophthalmology;  Laterality: Right;   PHOTOCOAGULATION WITH LASER Right 02/07/2021   Procedure: PHOTOCOAGULATION WITH LASER;  Surgeon: Bernarda Caffey, MD;  Location: [place];  Service: Ophthalmology;  Laterality: Right;   TYMPANOSTOMY TUBE PLACEMENT Bilateral    child    SOCIAL HISTORY: Social History   Socioeconomic History   Marital status: Single    Spouse name: Not on file   Number of children: Not on file   Years of education: 11   Highest education level: Not on file  Occupational History   Occupation: Biochemist, clinical    Comment: RRD  Tobacco Use   Smoking status: Every Day    Packs/day: 0.25    Years: 6.00    Total pack years: 1.50    Types: Cigarettes    Passive exposure: Current   Smokeless tobacco: Never   Tobacco comments:    11-16-2020  per pt 3 cig per day  Vaping Use   Vaping Use: Former   Devices: uses different types   Substance and Sexual Activity   Alcohol use: Yes    Comment: socially   Drug use: Not Currently    Types: Marijuana   Sexual activity: Yes    Birth control/protection: None, Injection  Other Topics Concern   Not on file  Social History Narrative   Not on file   Social Determinants of Health   Financial Resource Strain: Not on file  Food Insecurity: No Food Insecurity (01/21/2021)   Hunger Vital Sign    Worried About Running Out of Food in the Last Year: Never true    [place] in the Last Year: Never true  Transportation Needs: No Transportation Needs (01/21/2021)   PRAPARE - Hydrologist (Medical): No    Lack of Transportation (Non-Medical): No  Physical Activity: Not on file  Stress: Not on file  Social Connections: Not on file  Intimate Partner Violence: Not on file    FAMILY HISTORY: Family History  Problem Relation Age of Onset   Diabetes Mother    Vision loss Mother    ADD / ADHD Brother    Allergies Brother    Asthma Brother    Vision loss Brother  Cancer Paternal Grandfather        Colon Cancer   Heart disease Father 35       died from MI at age 67    ALLERGIES:  is allergic to amoxicillin, augmentin [amoxicillin-pot clavulanate], penicillins, shellfish allergy, suprax [cefixime], and latex.  MEDICATIONS:  Current Outpatient Medications  Medication Sig Dispense Refill   Iron-Folate-C-Biot-B12-Zinc 160-1.7 MG TABS Take 1 tablet by mouth daily. (Patient not taking: Reported on 04/29/2022) 30 tablet 3   Accu-Chek Softclix Lancets lancets To check blood sugars 4 times a day. Fasting and 2 hours after breakfast, lunch and dinner. 100 each 8   atorvastatin (LIPITOR) 20 MG tablet TAKE 1 TABLET BY MOUTH EVERY DAY 90 tablet 0   Blood Pressure Monitoring (BLOOD PRESSURE KIT) DEVI 1 Device by Does not apply route once a week. Please take blood pressure once a week and record in Babyscripts. 1 each 0   Continuous Blood Gluc Sensor  (DEXCOM G6 SENSOR) MISC 1 Device by Does not apply route as directed. 9 each 3   Continuous Blood Gluc Transmit (DEXCOM G6 TRANSMITTER) MISC 1 Device by Does not apply route as directed. 1 each 3   Dulaglutide (TRULICITY) 1.61 WR/6.0AV SOPN Inject 0.75 mg into the skin once a week. 6 mL 3   empagliflozin (JARDIANCE) 25 MG TABS tablet Take 1 tablet (25 mg total) by mouth daily before breakfast. 90 tablet 3   glucose blood (ACCU-CHEK GUIDE) test strip To check blood sugars 4 times a day. Fasting, and 2 hours after Breakfast, Lunch and Dinner 100 each 8   ibuprofen (ADVIL) 800 MG tablet Take 800 mg by mouth 3 (three) times daily.     insulin aspart (NOVOLOG) 100 UNIT/ML injection Max daily 60 units per pump 60 mL 3   Insulin Disposable Pump (OMNIPOD 5 G6 INTRO, GEN 5,) KIT 1 Device by Does not apply route every other day. 1 kit 0   Insulin Disposable Pump (OMNIPOD 5 G6 POD, GEN 5,) MISC 1 Device by Does not apply route every other day. 15 each 3   Insulin Pen Needle 31G X 5 MM MISC 1 Device by Does not apply route in the morning, at noon, in the evening, and at bedtime. 400 each 2   lidocaine (XYLOCAINE) 2 % solution Use as directed 15 mLs in the mouth or throat every 3 (three) hours as needed for mouth pain. 100 mL 0   Misc. Devices (GOJJI WEIGHT SCALE) MISC 1 Device by Does not apply route once a week. Please take weight and record in Babyscripts once a week. 1 each 0   No current facility-administered medications for this visit.     PHYSICAL EXAMINATION: ECOG PERFORMANCE STATUS: 0 - Asymptomatic  There were no vitals filed for this visit.  There were no vitals filed for this visit.  PE not done, telephone visit.   LABORATORY DATA:  I have reviewed the data as listed Lab Results  Component Value Date   WBC 9.7 05/14/2022   HGB 12.1 05/14/2022   HCT 37.8 05/14/2022   MCV 79.6 (L) 05/14/2022   PLT 612 (H) 05/14/2022     Chemistry      Component Value Date/Time   NA 140 05/14/2022  1447   NA 141 02/26/2022 1638   K 3.6 05/14/2022 1447   CL 108 05/14/2022 1447   CO2 23 05/14/2022 1447   BUN 15 05/14/2022 1447   BUN 10 02/26/2022 1638   CREATININE 0.68 05/14/2022 1447  CREATININE 0.63 04/11/2013 0819      Component Value Date/Time   CALCIUM 9.0 05/14/2022 1447   ALKPHOS 66 05/14/2022 1447   AST 15 05/14/2022 1447   ALT 16 05/14/2022 1447   BILITOT 0.4 05/14/2022 1447     PE not done, telephone visit.  RADIOGRAPHIC STUDIES: I have personally reviewed the radiological images as listed and agreed with the findings in the report. No results found.  All questions were answered. The patient knows to call the clinic with any problems, questions or concerns. I spent 15 minutes in the care of this patient including History, review of records, counseling and coordination of care.  I connected with  Rip Harbour on 05/15/22 by a telephone application and verified that I am speaking with the correct person using two identifiers.   I discussed the limitations of evaluation and management by telemedicine. The patient expressed understanding and agreed to proceed.     Benay Pike, MD 05/15/2022 12:25 PM

## 2022-05-16 ENCOUNTER — Telehealth: Payer: Medicaid Other | Admitting: Hematology and Oncology

## 2022-05-20 ENCOUNTER — Inpatient Hospital Stay: Payer: Medicaid Other

## 2022-05-26 ENCOUNTER — Telehealth: Payer: Self-pay

## 2022-05-26 ENCOUNTER — Encounter: Payer: Self-pay | Admitting: Internal Medicine

## 2022-05-26 MED ORDER — DEXCOM G6 SENSOR MISC: 1.0000 | 3 refills | Status: DC

## 2022-05-26 MED ORDER — DEXCOM G6 TRANSMITTER MISC: 1.0000 | 3 refills | Status: DC

## 2022-06-12 ENCOUNTER — Inpatient Hospital Stay: Payer: Medicaid Other | Attending: Hematology and Oncology | Admitting: Hematology and Oncology

## 2022-06-12 NOTE — Progress Notes (Deleted)
[facility] CONSULT NOTE  Patient Care Team: Mercedes Herter, NP as PCP - General (Nurse Practitioner)  CHIEF COMPLAINTS/PURPOSE OF CONSULTATION:  Thrombocytosis  ASSESSMENT & PLAN:   This is a very pleasant 27 year old female patient with chronic thrombocythemia referred to hematology for additional recommendations given worsening thrombocytosis.   I recommended JAK 2 mutation. All her questions were answered to the best my knowledge.  Thank you for consulting [place] in the care of this patient.  Please do not hesitate to contact us with any additional questions or concerns.   HISTORY OF PRESENTING ILLNESS:  Mercedes Valdez 27 y.o. female is here because of thrombocytosis. This is a 27 year old female patient with type 2 diabetes currently on Jardiance, insulin referred to hematology for evaluation of persistent and worsening thrombocytosis.  Patient denies any health complaints.  Her last hemoglobin A1c is 8.2% and she is working on lowering it.  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  Mom complains of possible hidradenitis suppurativa since she also has it and patient happens to have recurrent boils in her axilla which frequently need antibiotics.  No other known autoimmune disease.  No known iron deficiency anemia.    Interval history    REVIEW OF SYSTEMS:   Constitutional: Denies fevers, chills or abnormal night sweats Eyes: Denies blurriness of vision, double vision or watery eyes Ears, nose, mouth, throat, and face: Denies mucositis or sore throat Respiratory: Denies cough, dyspnea or wheezes Cardiovascular: Denies palpitation, chest discomfort or lower extremity swelling Gastrointestinal:  Denies nausea, heartburn or change in bowel habits Skin: Denies abnormal skin rashes Lymphatics: Denies new lymphadenopathy or easy bruising Neurological:Denies numbness, tingling or new weaknesses Behavioral/Psych: Mood is stable, no new  changes  All other systems were reviewed with the patient and are negative.  MEDICAL HISTORY:  Past Medical History:  Diagnosis Date   ADHD (attention deficit hyperactivity disorder)    Asthma    Childhood   Diabetic retinopathy ([place])    Elevated blood pressure reading without diagnosis of hypertension    Endometrial mass    History of 2019 novel coronavirus disease (COVID-19) 03/09/2020   positive result in epic,  per pt mild to moderate symptoms that resolved   History of asthma    child   History of gonorrhea 2016   Hypertensive retinopathy    Insulin dependent type 2 diabetes mellitus ([place])    followd by pcp---- (11-16-2020 per pt checks blood sugar at home 4-5 times daily,  fasting sugar--- 70--95)   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 See comment>>a molar pregnancy is not excluded; correlation with beta-HCG is  recommended.  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    SURGICAL HISTORY: Past Surgical History:  Procedure Laterality Date   DILATATION & CURETTAGE/HYSTEROSCOPY WITH MYOSURE N/A 11/21/2020   Procedure: [place];  Surgeon: Aletha Halim, MD;  Location: [facility];  Service: Gynecology;  Laterality: N/A;   INJECTION OF SILICONE OIL Right [id number]   Procedure: INJECTION OF SILICONE OIL;  Surgeon: Bernarda Caffey, MD;  Location: [place] [facility];  Service:  Ophthalmology;  Laterality: Right;   KNEE ARTHROSCOPY W/ ACL RECONSTRUCTION Left 2011   MEMBRANE PEEL Right 02/07/2021   Procedure: MEMBRANE PEEL;  Surgeon: Bernarda Caffey, MD;  Location: [place];  Service:  Ophthalmology;  Laterality: Right;   PARS PLANA VITRECTOMY Right 02/07/2021   Procedure: TWENTY-FIVE  GAUGE PARS PLANA VITRECTOMY;  Surgeon: Bernarda Caffey, MD;  Location: [facility];  Service: Ophthalmology;  Laterality: Right;   PERFLUORONE INJECTION Right 02/07/2021   Procedure: PERFLUORON INJECTION;  Surgeon: Bernarda Caffey, MD;  Location: [facility];  Service: Ophthalmology;  Laterality: Right;   PHOTOCOAGULATION WITH LASER Right 02/07/2021   Procedure: PHOTOCOAGULATION WITH LASER;  Surgeon: Bernarda Caffey, MD;  Location: [facility];  Service: Ophthalmology;  Laterality: Right;   TYMPANOSTOMY TUBE PLACEMENT Bilateral    child    SOCIAL HISTORY: Social History   Socioeconomic History   Marital status: Single    Spouse name: Not on file   Number of children: Not on file   Years of education: 11   Highest education level: Not on file  Occupational History   Occupation: Biochemist, clinical    Comment: RRD  Tobacco Use   Smoking status: Every Day    Packs/day: 0.25    Years: 6.00    Total pack years: 1.50    Types: Cigarettes    Passive exposure: Current   Smokeless tobacco: Never   Tobacco comments:    11-16-2020  per pt 3 cig per day  Vaping Use   Vaping Use: Former   Devices: uses different types  Substance and Sexual Activity   Alcohol use: Yes    Comment: socially   Drug use: Not Currently    Types: Marijuana   Sexual activity: Yes    Birth control/protection: None, Injection  Other Topics Concern   Not on file  Social History Narrative   Not on file   Social Determinants of Health   Financial Resource Strain: Not on file  Food Insecurity: No Food Insecurity (01/21/2021)   Hunger Vital Sign    Worried About Running Out of Food in the Last Year: Never true    [place] in the Last Year: Never true  Transportation Needs: No Transportation Needs (01/21/2021)   PRAPARE - Hydrologist (Medical): No    Lack of Transportation (Non-Medical): No   Physical Activity: Not on file  Stress: Not on file  Social Connections: Not on file  Intimate Partner Violence: Not on file    FAMILY HISTORY: Family History  Problem Relation Age of Onset   Diabetes Mother    Vision loss Mother    ADD / ADHD Brother    Allergies Brother    Asthma Brother    Vision loss Brother    Cancer Paternal Grandfather        Colon Cancer   Heart disease Father 36       died from MI at age 33    ALLERGIES:  is allergic to amoxicillin, augmentin [amoxicillin-pot clavulanate], penicillins, shellfish allergy, suprax [cefixime], and latex.  MEDICATIONS:  Current Outpatient Medications  Medication Sig Dispense Refill   Iron-Folate-C-Biot-B12-Zinc 160-1.7 MG TABS Take 1 tablet by mouth daily. (Patient not taking: Reported on 04/29/2022) 30 tablet 3   Accu-Chek Softclix Lancets lancets To check blood sugars 4 times a day. Fasting and 2 hours after breakfast, lunch and dinner. 100 each 8   atorvastatin (LIPITOR) 20 MG tablet TAKE 1 TABLET BY MOUTH EVERY DAY 90 tablet  0   Blood Pressure Monitoring (BLOOD PRESSURE KIT) DEVI 1 Device by Does not apply route once a week. Please take blood pressure once a week and record in Babyscripts. 1 each 0   Continuous Blood Gluc Sensor (DEXCOM G6 SENSOR) MISC 1 Device by Does not apply route as directed. 9 each 3   Continuous Blood Gluc Transmit (DEXCOM G6 TRANSMITTER) MISC 1 Device by Does not apply route as directed. 1 each 3   Dulaglutide (TRULICITY) A999333 0000000 SOPN Inject 0.75 mg into the skin once a week. 6 mL 3   empagliflozin (JARDIANCE) 25 MG TABS tablet Take 1 tablet (25 mg total) by mouth daily before breakfast. 90 tablet 3   glucose blood (ACCU-CHEK GUIDE) test strip To check blood sugars 4 times a day. Fasting, and 2 hours after Breakfast, Lunch and Dinner 100 each 8   ibuprofen (ADVIL) 800 MG tablet Take 800 mg by mouth 3 (three) times daily.     insulin aspart (NOVOLOG) 100 UNIT/ML injection Max daily 60 units  per pump 60 mL 3   Insulin Disposable Pump (OMNIPOD 5 G6 INTRO, GEN 5,) KIT 1 Device by Does not apply route every other day. 1 kit 0   Insulin Disposable Pump (OMNIPOD 5 G6 POD, GEN 5,) MISC 1 Device by Does not apply route every other day. 15 each 3   Insulin Pen Needle 31G X 5 MM MISC 1 Device by Does not apply route in the morning, at noon, in the evening, and at bedtime. 400 each 2   lidocaine (XYLOCAINE) 2 % solution Use as directed 15 mLs in the mouth or throat every 3 (three) hours as needed for mouth pain. 100 mL 0   Misc. Devices (GOJJI WEIGHT SCALE) MISC 1 Device by Does not apply route once a week. Please take weight and record in Babyscripts once a week. 1 each 0   No current facility-administered medications for this visit.     PHYSICAL EXAMINATION: ECOG PERFORMANCE STATUS: 0 - Asymptomatic  There were no vitals filed for this visit.  There were no vitals filed for this visit.  PE not done, telephone visit.   LABORATORY DATA:  I have reviewed the data as listed Lab Results  Component Value Date   WBC 9.7 05/14/2022   HGB 12.1 05/14/2022   HCT 37.8 05/14/2022   MCV 79.6 (L) 05/14/2022   PLT 612 (H) 05/14/2022     Chemistry      Component Value Date/Time   NA 140 05/14/2022 1447   NA 141 02/26/2022 1638   K 3.6 05/14/2022 1447   CL 108 05/14/2022 1447   CO2 23 05/14/2022 1447   BUN 15 05/14/2022 1447   BUN 10 02/26/2022 1638   CREATININE 0.68 05/14/2022 1447   CREATININE 0.63 04/11/2013 0819      Component Value Date/Time   CALCIUM 9.0 05/14/2022 1447   ALKPHOS 66 05/14/2022 1447   AST 15 05/14/2022 1447   ALT 16 05/14/2022 1447   BILITOT 0.4 05/14/2022 1447     PE not done, telephone visit.  RADIOGRAPHIC STUDIES: I have personally reviewed the radiological images as listed and agreed with the findings in the report. No results found.  All questions were answered. The patient knows to call the clinic with any problems, questions or concerns.  I  discussed the limitations of evaluation and management by telemedicine. The patient expressed understanding and agreed to proceed.     Benay Pike, MD 06/12/2022 1:07 PM

## 2022-06-30 NOTE — Telephone Encounter (Signed)
Patient given samples, Process PA for Dexcom sensors and transmitter

## 2022-06-30 NOTE — Telephone Encounter (Signed)
Patient came in to office saying that she is completely out of  Dexcom G6 Sensor Continuous Blood Gluc Sensor (DEXCOM G6 SENSOR) MISC  And   Dexcom G6 Transmitter Continuous Blood Gluc Transmit (DEXCOM G6 TRANSMITTER) MISC  Patient was told by pharmacy that a prior authorization was needed otherwise the prescriptions would be $3000+.  [id number](Ph: [phone]

## 2022-07-01 ENCOUNTER — Telehealth: Payer: Self-pay | Admitting: Pharmacy Technician

## 2022-07-01 ENCOUNTER — Other Ambulatory Visit (HOSPITAL_COMMUNITY): Payer: Self-pay

## 2022-07-01 NOTE — Telephone Encounter (Signed)
Pharmacy Patient Advocate Encounter   Received notification from pt call msgs that prior authorization for Dexcom is required/requested.  Per Test Claim: No PA needed.    PA submitted on 07/01/22 to (ins) [place] Medicaid via CoverMyMeds Key [id number] Available without PA.   Called pharmacy to fill

## 2022-07-28 ENCOUNTER — Emergency Department (HOSPITAL_COMMUNITY)
Admission: EM | Admit: 2022-07-28 | Discharge: 2022-07-28 | Disposition: A | Discharge status: Home / Self Care | Payer: Medicaid Other | Attending: Emergency Medicine | Admitting: Emergency Medicine

## 2022-07-28 ENCOUNTER — Emergency Department (HOSPITAL_COMMUNITY): Payer: Medicaid Other

## 2022-07-28 DIAGNOSIS — Z794 Long term (current) use of insulin: Secondary | ICD-10-CM | POA: Diagnosis not present

## 2022-07-28 DIAGNOSIS — R0789 Other chest pain: Secondary | ICD-10-CM

## 2022-07-28 DIAGNOSIS — Z20822 Contact with and (suspected) exposure to covid-19: Secondary | ICD-10-CM | POA: Insufficient documentation

## 2022-07-28 DIAGNOSIS — E119 Type 2 diabetes mellitus without complications: Secondary | ICD-10-CM | POA: Insufficient documentation

## 2022-07-28 DIAGNOSIS — Z91040 Latex allergy status: Secondary | ICD-10-CM | POA: Diagnosis not present

## 2022-07-28 DIAGNOSIS — J4521 Mild intermittent asthma with (acute) exacerbation: Secondary | ICD-10-CM

## 2022-07-28 DIAGNOSIS — Z7984 Long term (current) use of oral hypoglycemic drugs: Secondary | ICD-10-CM | POA: Diagnosis not present

## 2022-07-28 DIAGNOSIS — R0602 Shortness of breath: Secondary | ICD-10-CM | POA: Diagnosis present

## 2022-07-28 LAB — CBC WITH DIFFERENTIAL/PLATELET
Abs Immature Granulocytes: 0.03 10*3/uL (ref 0.00–0.07)
Basophils Absolute: 0 10*3/uL (ref 0.0–0.1)
Basophils Relative: 0 %
Eosinophils Absolute: 0.4 10*3/uL (ref 0.0–0.5)
Eosinophils Relative: 4 %
HCT: 35.9 % — ABNORMAL LOW (ref 36.0–46.0)
Hemoglobin: 11.6 g/dL — ABNORMAL LOW (ref 12.0–15.0)
Immature Granulocytes: 0 %
Lymphocytes Relative: 45 %
Lymphs Abs: 4.3 10*3/uL — ABNORMAL HIGH (ref 0.7–4.0)
MCH: 26.3 pg (ref 26.0–34.0)
MCHC: 32.3 g/dL (ref 30.0–36.0)
MCV: 81.4 fL (ref 80.0–100.0)
Monocytes Absolute: 0.4 10*3/uL (ref 0.1–1.0)
Monocytes Relative: 4 %
Neutro Abs: 4.4 10*3/uL (ref 1.7–7.7)
Neutrophils Relative %: 47 %
Platelets: 577 10*3/uL — ABNORMAL HIGH (ref 150–400)
RBC: 4.41 MIL/uL (ref 3.87–5.11)
RDW: 15.5 % (ref 11.5–15.5)
WBC: 9.6 10*3/uL (ref 4.0–10.5)
nRBC: 0 % (ref 0.0–0.2)

## 2022-07-28 LAB — BASIC METABOLIC PANEL
Anion gap: 9 (ref 5–15)
BUN: 12 mg/dL (ref 6–20)
CO2: 22 mmol/L (ref 22–32)
Calcium: 8.8 mg/dL — ABNORMAL LOW (ref 8.9–10.3)
Chloride: 103 mmol/L (ref 98–111)
Creatinine, Ser: 0.73 mg/dL (ref 0.44–1.00)
GFR, Estimated: >60 mL/min (ref >60)
Glucose, Bld: 181 mg/dL — ABNORMAL HIGH (ref 70–99)
Potassium: 3.6 mmol/L (ref 3.5–5.1)
Sodium: 134 mmol/L — ABNORMAL LOW (ref 135–145)

## 2022-07-28 LAB — RESP PANEL BY RT-PCR (RSV, FLU A&B, COVID)  RVPGX2
Influenza A by PCR: NEGATIVE
Influenza B by PCR: NEGATIVE
Resp Syncytial Virus by PCR: NEGATIVE
SARS Coronavirus 2 by RT PCR: NEGATIVE

## 2022-07-28 LAB — URINALYSIS, ROUTINE W REFLEX MICROSCOPIC
Bilirubin Urine: NEGATIVE
Glucose, UA: >=500 mg/dL — AB
Hgb urine dipstick: NEGATIVE
Ketones, ur: NEGATIVE mg/dL
Nitrite: NEGATIVE
Protein, ur: NEGATIVE mg/dL
Specific Gravity, Urine: 1.026 (ref 1.005–1.030)
pH: 5 (ref 5.0–8.0)

## 2022-07-28 LAB — I-STAT BETA HCG BLOOD, ED (MC, WL, AP ONLY): I-stat hCG, quantitative: <5 m[IU]/mL (ref <5)

## 2022-07-28 LAB — D-DIMER, QUANTITATIVE: D-Dimer, Quant: <0.27 ug/mL-FEU (ref 0.00–0.50)

## 2022-07-28 LAB — BRAIN NATRIURETIC PEPTIDE: B Natriuretic Peptide: 4.4 pg/mL (ref 0.0–100.0)

## 2022-07-28 MED ORDER — KETOROLAC TROMETHAMINE 30 MG/ML IJ SOLN
30.0000 mg | Freq: Once | INTRAMUSCULAR | Status: AC
  Administered 2022-07-28: 30 mg via INTRAVENOUS
  Filled 2022-07-28: qty 1

## 2022-07-28 MED ORDER — ALBUTEROL SULFATE HFA 108 (90 BASE) MCG/ACT IN AERS
1.0000 | INHALATION_SPRAY | RESPIRATORY_TRACT | Status: DC | PRN
  Administered 2022-07-28: 2 via RESPIRATORY_TRACT
  Filled 2022-07-28: qty 6.7

## 2022-07-28 MED ORDER — IPRATROPIUM-ALBUTEROL 0.5-2.5 (3) MG/3ML IN SOLN
3.0000 mL | Freq: Once | RESPIRATORY_TRACT | Status: AC
  Administered 2022-07-28: 3 mL via RESPIRATORY_TRACT
  Filled 2022-07-28: qty 3

## 2022-07-28 MED ORDER — AEROCHAMBER Z-STAT PLUS/MEDIUM MISC
1.0000 | Freq: Once | Status: DC
  Filled 2022-07-28: qty 1

## 2022-07-28 MED ORDER — IPRATROPIUM-ALBUTEROL 0.5-2.5 (3) MG/3ML IN SOLN: 3.0000 mL | Freq: Once | RESPIRATORY_TRACT | Status: DC

## 2022-07-28 NOTE — ED Provider Notes (Signed)
[facility] Provider Note   CSN: [id number] Arrival date & time: 07/28/22  [id number]     History  Chief Complaint  Patient presents with   Chest Pain    For several months    Mercedes Valdez is a 27 y.o. female.  Pt is a 27 yo female with pmhx significant for adhd, dm2, pcos, asthma, retinopathy, and thrombocytosis.  Pt said she's been sob for a few days.  It was worse this am.  She had gi sx a few days ago and now the sob.  No fevers.       Home Medications Prior to Admission medications   Medication Sig Start Date End Date Taking? Authorizing Provider  Iron-Folate-C-Biot-B12-Zinc 160-1.7 MG TABS Take 1 tablet by mouth daily. Patient not taking: Reported on 04/29/2022 04/02/22   Benay Pike, MD  Accu-Chek Softclix Lancets lancets To check blood sugars 4 times a day. Fasting and 2 hours after breakfast, lunch and dinner. 02/25/21   Camillia Herter, NP  atorvastatin (LIPITOR) 20 MG tablet TAKE 1 TABLET BY MOUTH EVERY DAY 05/14/22   Camillia Herter, NP  Blood Pressure Monitoring (BLOOD PRESSURE KIT) DEVI 1 Device by Does not apply route once a week. Please take blood pressure once a week and record in Babyscripts. 10/08/20   Clarnce Flock, MD  Continuous Blood Gluc Sensor (DEXCOM G6 SENSOR) MISC 1 Device by Does not apply route as directed. 05/26/22   Shamleffer, Melanie Crazier, MD  Continuous Blood Gluc Transmit (DEXCOM G6 TRANSMITTER) MISC 1 Device by Does not apply route as directed. 05/26/22   Shamleffer, Melanie Crazier, MD  Dulaglutide (TRULICITY) [id number] [id number] SOPN Inject 0.75 mg into the skin once a week. 04/29/22   Shamleffer, Melanie Crazier, MD  empagliflozin (JARDIANCE) 25 MG TABS tablet Take 1 tablet (25 mg total) by mouth daily before breakfast. 04/29/22   Shamleffer, Melanie Crazier, MD  glucose blood (ACCU-CHEK GUIDE) test strip To check blood sugars 4 times a day. Fasting, and 2 hours after Breakfast, Lunch and Dinner 02/25/21    Camillia Herter, NP  ibuprofen (ADVIL) 800 MG tablet Take 800 mg by mouth 3 (three) times daily. 04/22/21   [provider]  insulin aspart (NOVOLOG) 100 UNIT/ML injection Max daily 60 units per pump 04/29/22   Shamleffer, Melanie Crazier, MD  Insulin Disposable Pump (OMNIPOD 5 G6 INTRO, GEN 5,) KIT 1 Device by Does not apply route every other day. 01/09/22   Shamleffer, Melanie Crazier, MD  Insulin Disposable Pump (OMNIPOD 5 G6 POD, GEN 5,) MISC 1 Device by Does not apply route every other day. 01/09/22   Shamleffer, Melanie Crazier, MD  Insulin Pen Needle 31G X 5 MM MISC 1 Device by Does not apply route in the morning, at noon, in the evening, and at bedtime. 05/08/21   Shamleffer, Melanie Crazier, MD  lidocaine (XYLOCAINE) 2 % solution Use as directed 15 mLs in the mouth or throat every 3 (three) hours as needed for mouth pain. 04/27/22   Rising, Wells Guiles, PA-C  Misc. Devices (GOJJI WEIGHT SCALE) MISC 1 Device by Does not apply route once a week. Please take weight and record in Babyscripts once a week. 10/08/20   Clarnce Flock, MD      Allergies    Amoxicillin, Augmentin [amoxicillin-pot clavulanate], Penicillins, Shellfish allergy, Suprax [cefixime], and Latex    Review of Systems   Review of Systems  Respiratory:  Positive for cough and shortness of  breath.   All other systems reviewed and are negative.   Physical Exam Updated Vital Signs BP 118/80   Pulse 92   Temp 99 F (37.2 C) (Oral)   Resp 19   SpO2 100%  Physical Exam Vitals and nursing note reviewed.  Constitutional:      Appearance: Normal appearance.  HENT:     Head: Normocephalic and atraumatic.     Right Ear: External ear normal.     Left Ear: External ear normal.     Nose: Nose normal.     Mouth/Throat:     Mouth: Mucous membranes are moist.     Pharynx: Oropharynx is clear.  Eyes:     Extraocular Movements: Extraocular movements intact.     Conjunctiva/sclera: Conjunctivae normal.     Pupils:  Pupils are equal, round, and reactive to light.  Cardiovascular:     Rate and Rhythm: Normal rate and regular rhythm.     Pulses: Normal pulses.     Heart sounds: Normal heart sounds.  Pulmonary:     Breath sounds: Wheezing present.  Abdominal:     General: Abdomen is flat. Bowel sounds are normal.     Palpations: Abdomen is soft.  Musculoskeletal:        General: Normal range of motion.     Cervical back: Normal range of motion and neck supple.  Skin:    General: Skin is warm.     Capillary Refill: Capillary refill takes less than 2 seconds.  Neurological:     General: No focal deficit present.     Mental Status: She is alert and oriented to person, place, and time.  Psychiatric:        Mood and Affect: Mood normal.        Behavior: Behavior normal.     ED Results / Procedures / Treatments   Labs (all labs ordered are listed, but only abnormal results are displayed) Labs Reviewed  BASIC METABOLIC PANEL - Abnormal; Notable for the following components:      Result Value   Sodium 134 (*)    Glucose, Bld 181 (*)    Calcium 8.8 (*)    All other components within normal limits  CBC WITH DIFFERENTIAL/PLATELET - Abnormal; Notable for the following components:   Hemoglobin 11.6 (*)    HCT 35.9 (*)    Platelets 577 (*)    Lymphs Abs 4.3 (*)    All other components within normal limits  URINALYSIS, ROUTINE W REFLEX MICROSCOPIC - Abnormal; Notable for the following components:   APPearance CLOUDY (*)    Glucose, UA >=500 (*)    Leukocytes,Ua SMALL (*)    Bacteria, UA RARE (*)    All other components within normal limits  RESP PANEL BY RT-PCR (RSV, FLU A&B, COVID)  [id number]  BRAIN NATRIURETIC PEPTIDE  D-DIMER, QUANTITATIVE  I-STAT BETA HCG BLOOD, ED ([facility], [facility], [facility] ONLY)    EKG EKG Interpretation  Date/Time:  Monday July 28 2022 08:22:02 EST Ventricular Rate:  76 PR Interval:  115 QRS Duration: 84 QT Interval:  384 QTC Calculation: 432 R Axis:   41 Text  Interpretation: Sinus rhythm Borderline short PR interval Since last tracing rate slower Confirmed by Isla Pence [phone]) on 07/28/2022 9:11:35 AM  Radiology DG Chest Port 1 View  Result Date: 07/28/2022 CLINICAL DATA:  Shortness of breath EXAM: PORTABLE CHEST 1 VIEW COMPARISON:  CXR 03/09/20 FINDINGS: No pleural effusion. No pneumothorax. Normal cardiac and mediastinal contours. No focal airspace opacity.  No radiographically apparent displaced rib fractures. Visualized upper abdomen is unremarkable. IMPRESSION: No acute cardiopulmonary disease. Electronically Signed   By: Marin Roberts M.D.   On: 07/28/2022 08:43    Procedures Procedures    Medications Ordered in ED Medications  albuterol (VENTOLIN HFA) 108 (90 Base) MCG/ACT inhaler 1-2 puff (has no administration in time range)  aerochamber Z-Stat Plus/medium 1 each (has no administration in time range)  ipratropium-albuterol (DUONEB) 0.5-2.5 (3) MG/3ML nebulizer solution 3 mL (3 mLs Nebulization Given 07/28/22 0815)  ketorolac (TORADOL) 30 MG/ML injection 30 mg (30 mg Intravenous Given 07/28/22 0957)    ED Course/ Medical Decision Making/ A&P                             Medical Decision Making Amount and/or Complexity of Data Reviewed Labs: ordered. Radiology: ordered.  Risk Prescription drug management.   This patient presents to the ED for concern of sob, this involves an extensive number of treatment options, and is a complaint that carries with it a high risk of complications and morbidity.  The differential diagnosis includes covid/flu/rsv, pna, asthma exac, PE   Co morbidities that complicate the patient evaluation  adhd, dm2, pcos, asthma, retinopathy, and thrombocytosis   Additional history obtained:  Additional history obtained from epic chart review External records from outside source obtained and reviewed including EMS report   Lab Tests:  I Ordered, and personally interpreted labs.  The pertinent results  include:  cbc with hgb 11.6 and plt 577 (chronic), bmp with glucose elevated at 181, preg <5, covid/flu/rsv neg   Imaging Studies ordered:  I ordered imaging studies including cxr  I independently visualized and interpreted imaging which showed No acute cardiopulmonary disease.  I agree with the radiologist interpretation   Cardiac Monitoring:  The patient was maintained on a cardiac monitor.  I personally viewed and interpreted the cardiac monitored which showed an underlying rhythm of: nsr   Medicines ordered and prescription drug management:  I ordered medication including duoneb  for wheezing  Reevaluation of the patient after these medicines showed that the patient improved I have reviewed the patients home medicines and have made adjustments as needed   Test Considered:  CT, but ddimer neg   Critical Interventions:  neb   Problem List / ED Course:  SOB:  likely viral with RAD.  Ddimer neg.  Cxr clear. Pt is d/c with an inhaler.  She is stable for d/c.  Return if worse.   Reevaluation:  After the interventions noted above, I reevaluated the patient and found that they have :improved   Social Determinants of Health:  Lives at home   Dispostion:  After consideration of the diagnostic results and the patients response to treatment, I feel that the patent would benefit from discharge with outpatient f/u.          Final Clinical Impression(s) / ED Diagnoses Final diagnoses:  Atypical chest pain  Mild intermittent asthma with exacerbation    Rx / DC Orders ED Discharge Orders     None         Isla Pence, MD 07/28/22 1108

## 2022-07-28 NOTE — ED Triage Notes (Signed)
Pt brought self to ED after having chest pain down the center of her chest and cough for several months. Pt reports coming in today d/t worsening pain with cough. Pt denies flu like symptoms. A&Ox4, ABCs intact at this time.

## 2022-07-28 NOTE — ED Notes (Signed)
This RN reviewed discharge instructions with patient. She verbalized understanding and denied any further questions. PT well appearing upon discharge and reports tolerable pain. Pt ambulated with stable gait to exit. Pt endorses ride home.

## 2022-07-28 NOTE — ED Notes (Signed)
Provider at bedside

## 2022-08-05 ENCOUNTER — Other Ambulatory Visit: Payer: Self-pay | Admitting: Family

## 2022-08-05 DIAGNOSIS — E785 Hyperlipidemia, unspecified: Secondary | ICD-10-CM

## 2022-08-06 NOTE — Telephone Encounter (Signed)
Requested Prescriptions  Pending Prescriptions Disp Refills   atorvastatin (LIPITOR) 20 MG tablet [Pharmacy Med Name: ATORVASTATIN 20 MG TABLET] 90 tablet 0    Sig: TAKE 1 TABLET BY MOUTH EVERY DAY     Cardiovascular:  Antilipid - Statins Failed - 08/05/2022  1:30 PM      Failed - Lipid Panel in normal range within the last 12 months    Cholesterol, Total  Date Value Ref Range Status  02/26/2022 175 100 - 199 mg/dL Final   LDL Chol Calc (NIH)  Date Value Ref Range Status  02/26/2022 103 (H) 0 - 99 mg/dL Final   HDL  Date Value Ref Range Status  02/26/2022 42 >39 mg/dL Final   Triglycerides  Date Value Ref Range Status  02/26/2022 171 (H) 0 - 149 mg/dL Final         Passed - Patient is not pregnant      Passed - Valid encounter within last 12 months    Recent Outpatient Visits           5 months ago Annual physical exam   [facility], [place], NP   1 year ago Type 2 diabetes mellitus without complication, with long-term current use of insulin ([place])   [facility], [place], NP   1 year ago Encounter to establish care   [facility], Amy J, NP   3 years ago Type 2 diabetes mellitus with hyperglycemia, with long-term current use of insulin [facility])   [place], [place] L, RPH-CPP   3 years ago Type 2 diabetes mellitus with hyperglycemia, with long-term current use of insulin [facility])   [facility] Antony Blackbird, MD

## 2022-09-09 ENCOUNTER — Ambulatory Visit: Payer: Medicaid Other | Admitting: Internal Medicine

## 2022-09-17 ENCOUNTER — Telehealth: Payer: Self-pay | Admitting: Hematology and Oncology

## 2022-09-17 ENCOUNTER — Inpatient Hospital Stay: Payer: Medicaid Other | Admitting: Hematology and Oncology

## 2022-09-17 NOTE — Telephone Encounter (Signed)
Spoke with patient confirming upcoming appointment  

## 2022-09-19 ENCOUNTER — Other Ambulatory Visit: Payer: Self-pay | Admitting: *Deleted

## 2022-09-19 ENCOUNTER — Inpatient Hospital Stay: Payer: Medicaid Other | Attending: Hematology and Oncology | Admitting: Hematology and Oncology

## 2022-09-19 ENCOUNTER — Inpatient Hospital Stay: Payer: Medicaid Other

## 2022-09-19 ENCOUNTER — Other Ambulatory Visit: Payer: Self-pay

## 2022-09-19 VITALS — BP 137/87 | HR 91 | Temp 97.9°F | Resp 16 | Ht 65.0 in | Wt 238.1 lb

## 2022-09-19 DIAGNOSIS — Z794 Long term (current) use of insulin: Secondary | ICD-10-CM | POA: Insufficient documentation

## 2022-09-19 DIAGNOSIS — L732 Hidradenitis suppurativa: Secondary | ICD-10-CM | POA: Insufficient documentation

## 2022-09-19 DIAGNOSIS — D75839 Thrombocytosis, unspecified: Secondary | ICD-10-CM

## 2022-09-19 DIAGNOSIS — F17210 Nicotine dependence, cigarettes, uncomplicated: Secondary | ICD-10-CM | POA: Diagnosis not present

## 2022-09-19 DIAGNOSIS — Z79899 Other long term (current) drug therapy: Secondary | ICD-10-CM | POA: Diagnosis not present

## 2022-09-19 DIAGNOSIS — E119 Type 2 diabetes mellitus without complications: Secondary | ICD-10-CM | POA: Diagnosis not present

## 2022-09-19 LAB — IRON AND IRON BINDING CAPACITY (CC-WL,HP ONLY)
Iron: 36 ug/dL (ref 28–170)
Saturation Ratios: 7 % — ABNORMAL LOW (ref 10.4–31.8)
TIBC: 494 ug/dL — ABNORMAL HIGH (ref 250–450)
UIBC: 458 ug/dL — ABNORMAL HIGH (ref 148–442)

## 2022-09-19 LAB — COMPREHENSIVE METABOLIC PANEL
ALT: 13 U/L (ref 0–44)
AST: 14 U/L — ABNORMAL LOW (ref 15–41)
Albumin: 4.5 g/dL (ref 3.5–5.0)
Alkaline Phosphatase: 82 U/L (ref 38–126)
Anion gap: 8 (ref 5–15)
BUN: 7 mg/dL (ref 6–20)
CO2: 27 mmol/L (ref 22–32)
Calcium: 9.7 mg/dL (ref 8.9–10.3)
Chloride: 105 mmol/L (ref 98–111)
Creatinine, Ser: 0.61 mg/dL (ref 0.44–1.00)
GFR, Estimated: >60 mL/min (ref >60)
Glucose, Bld: 124 mg/dL — ABNORMAL HIGH (ref 70–99)
Potassium: 3.8 mmol/L (ref 3.5–5.1)
Sodium: 140 mmol/L (ref 135–145)
Total Bilirubin: 0.6 mg/dL (ref 0.3–1.2)
Total Protein: 8.3 g/dL — ABNORMAL HIGH (ref 6.5–8.1)

## 2022-09-19 LAB — CBC WITH DIFFERENTIAL/PLATELET
Abs Immature Granulocytes: 0.02 10*3/uL (ref 0.00–0.07)
Basophils Absolute: 0 10*3/uL (ref 0.0–0.1)
Basophils Relative: 0 %
Eosinophils Absolute: 0.3 10*3/uL (ref 0.0–0.5)
Eosinophils Relative: 4 %
HCT: 38.8 % (ref 36.0–46.0)
Hemoglobin: 12.6 g/dL (ref 12.0–15.0)
Immature Granulocytes: 0 %
Lymphocytes Relative: 48 %
Lymphs Abs: 4.1 10*3/uL — ABNORMAL HIGH (ref 0.7–4.0)
MCH: 26.4 pg (ref 26.0–34.0)
MCHC: 32.5 g/dL (ref 30.0–36.0)
MCV: 81.3 fL (ref 80.0–100.0)
Monocytes Absolute: 0.3 10*3/uL (ref 0.1–1.0)
Monocytes Relative: 4 %
Neutro Abs: 3.7 10*3/uL (ref 1.7–7.7)
Neutrophils Relative %: 44 %
Platelets: 611 10*3/uL — ABNORMAL HIGH (ref 150–400)
RBC: 4.77 MIL/uL (ref 3.87–5.11)
RDW: 14.8 % (ref 11.5–15.5)
WBC: 8.5 10*3/uL (ref 4.0–10.5)
nRBC: 0 % (ref 0.0–0.2)

## 2022-09-19 LAB — FERRITIN: Ferritin: 18 ng/mL (ref 11–307)

## 2022-09-19 NOTE — Progress Notes (Signed)
[name] CONSULT NOTE  Patient Care Team: Rema Fendt, NP as PCP - General (Nurse Practitioner)  CHIEF COMPLAINTS/PURPOSE OF CONSULTATION:  Thrombocytosis  ASSESSMENT & PLAN:   This is a very pleasant 27 year old female patient with chronic thrombocythemia referred to hematology for additional recommendations given thrombocytosis During our last visit, I recommended JAK 2 mutation but she could not make it to the lab visit.  We will draw CBC, iron panel, ferritin, JAK2 mutation, EPO levels and BCR-ABL today.  More than likely this is secondary thrombocytosis either from iron deficiency or combination of iron deficiency and chronic inflammation from hidradenitis.  However given the persistence, I believe it is reasonable to rule out primary thrombocytosis.  She is agreeable to these recommendations.  She will return to clinic in 6 months.  She was also encouraged to start taking oral iron supplementation at least every other day ferrous sulfate 65 mg until she comes back for follow-up.   HISTORY OF PRESENTING ILLNESS:  Mercedes Valdez 27 y.o. female is here because of thrombocytosis. This is a 27 year old female patient with type 2 diabetes currently on Jardiance, insulin referred to hematology for evaluation of persistent and worsening thrombocytosis. She has history of hidradenitis suppurativa.   No other known autoimmune disease.  No known iron deficiency anemia.   She is here for follow-up today with her mother today.  She is doing quite well.  Her last hemoglobin A1c was 8.3.  She also has hidradenitis suppurativa with intermittent boils in her axilla.  She denies any new B symptoms.  She states that appetite has actually increased if any.  No interim infections or hospitalizations.  She does have regular menstrual cycles as.  Rest of the pertinent 10 point ROS reviewed and negative  REVIEW OF SYSTEMS:   Constitutional: Denies fevers, chills or abnormal night  sweats Eyes: Denies blurriness of vision, double vision or watery eyes Ears, nose, mouth, throat, and face: Denies mucositis or sore throat Respiratory: Denies cough, dyspnea or wheezes Cardiovascular: Denies palpitation, chest discomfort or lower extremity swelling Gastrointestinal:  Denies nausea, heartburn or change in bowel habits Skin: Denies abnormal skin rashes Lymphatics: Denies new lymphadenopathy or easy bruising Neurological:Denies numbness, tingling or new weaknesses Behavioral/Psych: Mood is stable, no new changes  All other systems were reviewed with the patient and are negative.  MEDICAL HISTORY:  Past Medical History:  Diagnosis Date   ADHD (attention deficit hyperactivity disorder)    Asthma    Childhood   Diabetic retinopathy (HCC)    Elevated blood pressure reading without diagnosis of hypertension    Endometrial mass    History of 2019 novel coronavirus disease (COVID-19) 03/09/2020   positive result in epic,  per pt mild to moderate symptoms that resolved   History of asthma    child   History of gonorrhea 2016   Hypertensive retinopathy    Insulin dependent type 2 diabetes mellitus (HCC)    followd by pcp---- (11-16-2020 per pt checks blood sugar at home 4-5 times daily,  fasting sugar--- 70--95)   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 See comment>>a molar pregnancy is not excluded; correlation with beta-HCG is  recommended.  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    SURGICAL HISTORY: Past Surgical History:  Procedure Laterality Date   DILATATION & CURETTAGE/HYSTEROSCOPY WITH MYOSURE N/A  11/21/2020   Procedure: DILATATION & CURETTAGE/HYSTEROSCOPY WITH MYOSURE;  Surgeon: Engelhard Bing, MD;  Location: [facility];  Service: Gynecology;  Laterality: N/A;   INJECTION OF SILICONE OIL Right 02/07/2021   Procedure: INJECTION OF SILICONE OIL;  Surgeon: Rennis Chris, MD;  Location: [facility] OR;  Service: Ophthalmology;  Laterality: Right;   KNEE ARTHROSCOPY W/ ACL RECONSTRUCTION Left 2011   MEMBRANE PEEL Right 02/07/2021   Procedure: MEMBRANE PEEL;  Surgeon: Rennis Chris, MD;  Location: [facility] OR;  Service: Ophthalmology;  Laterality: Right;   PARS PLANA VITRECTOMY Right 02/07/2021   Procedure: TWENTY-FIVE  GAUGE PARS PLANA VITRECTOMY;  Surgeon: Rennis Chris, MD;  Location: [facility] OR;  Service: Ophthalmology;  Laterality: Right;   PERFLUORONE INJECTION Right 02/07/2021   Procedure: PERFLUORON INJECTION;  Surgeon: Rennis Chris, MD;  Location: [facility] OR;  Service: Ophthalmology;  Laterality: Right;   PHOTOCOAGULATION WITH LASER Right 02/07/2021   Procedure: PHOTOCOAGULATION WITH LASER;  Surgeon: Rennis Chris, MD;  Location: [facility] OR;  Service: Ophthalmology;  Laterality: Right;   TYMPANOSTOMY TUBE PLACEMENT Bilateral    child    SOCIAL HISTORY: Social History   Socioeconomic History   Marital status: Single    Spouse name: Not on file   Number of children: Not on file   Years of education: 11   Highest education level: Not on file  Occupational History   Occupation: Company secretary    Comment: [employer]  Tobacco Use   Smoking status: Every Day    Packs/day: 0.25    Years: 6.00    Additional pack years: 0.00    Total pack years: 1.50    Types: Cigarettes    Passive exposure: Current   Smokeless tobacco: Never   Tobacco comments:    11-16-2020  per pt 3 cig per day  Vaping Use   Vaping Use: Former   Devices: uses different types  Substance and Sexual Activity   Alcohol use: Yes    Comment: socially   Drug use: Not Currently    Types: Marijuana   Sexual activity: Yes     Birth control/protection: None, Injection  Other Topics Concern   Not on file  Social History Narrative   Not on file   Social Determinants of Health   Financial Resource Strain: Not on file  Food Insecurity: No Food Insecurity (01/21/2021)   Hunger Vital Sign    Worried About Running Out of Food in the Last Year: Never true    Ran Out of Food in the Last Year: Never true  Transportation Needs: No Transportation Needs (01/21/2021)   PRAPARE - Administrator, Civil Service (Medical): No    Lack of Transportation (Non-Medical): No  Physical Activity: Not on file  Stress: Not on file  Social Connections: Not on file  Intimate Partner Violence: Not on file    FAMILY HISTORY: Family History  Problem Relation Age of Onset   Diabetes Mother    Vision loss Mother    ADD / ADHD Brother    Allergies Brother    Asthma Brother    Vision loss Brother    Cancer Paternal Grandfather        Colon Cancer   Heart disease Father 54       died from MI at age 5  ALLERGIES:  is allergic to amoxicillin, augmentin [amoxicillin-pot clavulanate], penicillins, shellfish allergy, suprax [cefixime], and latex.  MEDICATIONS:  Current Outpatient Medications  Medication Sig Dispense Refill   Iron-Folate-C-Biot-B12-Zinc 160-1.7 MG TABS Take 1 tablet by mouth daily. (Patient not taking: Reported on 04/29/2022) 30 tablet 3   Accu-Chek Softclix Lancets lancets To check blood sugars 4 times a day. Fasting and 2 hours after breakfast, lunch and dinner. 100 each 8   atorvastatin (LIPITOR) 20 MG tablet TAKE 1 TABLET BY MOUTH EVERY DAY 90 tablet 0   Blood Pressure Monitoring (BLOOD PRESSURE KIT) DEVI 1 Device by Does not apply route once a week. Please take blood pressure once a week and record in Babyscripts. 1 each 0   Continuous Blood Gluc Sensor (DEXCOM G6 SENSOR) MISC 1 Device by Does not apply route as directed. 9 each 3   Continuous Blood Gluc Transmit (DEXCOM G6 TRANSMITTER) MISC 1 Device  by Does not apply route as directed. 1 each 3   Dulaglutide (TRULICITY) 0.75 MG/0.5ML SOPN Inject 0.75 mg into the skin once a week. 6 mL 3   empagliflozin (JARDIANCE) 25 MG TABS tablet Take 1 tablet (25 mg total) by mouth daily before breakfast. 90 tablet 3   glucose blood (ACCU-CHEK GUIDE) test strip To check blood sugars 4 times a day. Fasting, and 2 hours after Breakfast, Lunch and Dinner 100 each 8   ibuprofen (ADVIL) 800 MG tablet Take 800 mg by mouth 3 (three) times daily.     insulin aspart (NOVOLOG) 100 UNIT/ML injection Max daily 60 units per pump 60 mL 3   Insulin Disposable Pump (OMNIPOD 5 G6 INTRO, GEN 5,) KIT 1 Device by Does not apply route every other day. 1 kit 0   Insulin Disposable Pump (OMNIPOD 5 G6 POD, GEN 5,) MISC 1 Device by Does not apply route every other day. 15 each 3   Insulin Pen Needle 31G X 5 MM MISC 1 Device by Does not apply route in the morning, at noon, in the evening, and at bedtime. 400 each 2   lidocaine (XYLOCAINE) 2 % solution Use as directed 15 mLs in the mouth or throat every 3 (three) hours as needed for mouth pain. 100 mL 0   Misc. Devices (GOJJI WEIGHT SCALE) MISC 1 Device by Does not apply route once a week. Please take weight and record in Babyscripts once a week. 1 each 0   No current facility-administered medications for this visit.     PHYSICAL EXAMINATION: ECOG PERFORMANCE STATUS: 0 - Asymptomatic  Vitals:   09/19/22 0851  BP: 137/87  Pulse: 91  Resp: 16  Temp: 97.9 F (36.6 C)  SpO2: 100%    Filed Weights   09/19/22 0851  Weight: 238 lb 1.6 oz (108 kg)    Physical Exam Constitutional:      Appearance: Normal appearance.  Cardiovascular:     Rate and Rhythm: Normal rate and regular rhythm.     Pulses: Normal pulses.     Heart sounds: Normal heart sounds.  Pulmonary:     Effort: Pulmonary effort is normal.     Breath sounds: Normal breath sounds.  Abdominal:     General: Abdomen is flat.     Palpations: Abdomen is soft.   Musculoskeletal:        General: No swelling. Normal range of motion.     Cervical back: Normal range of motion. No rigidity.  Lymphadenopathy:     Cervical: No cervical adenopathy.  Skin:  General: Skin is warm and dry.  Neurological:     General: No focal deficit present.     Mental Status: She is alert.       LABORATORY DATA:  I have reviewed the data as listed Lab Results  Component Value Date   WBC 9.6 07/28/2022   HGB 11.6 (L) 07/28/2022   HCT 35.9 (L) 07/28/2022   MCV 81.4 07/28/2022   PLT 577 (H) 07/28/2022     Chemistry      Component Value Date/Time   NA 134 (L) 07/28/2022 0744   NA 141 02/26/2022 1638   K 3.6 07/28/2022 0744   CL 103 07/28/2022 0744   CO2 22 07/28/2022 0744   BUN 12 07/28/2022 0744   BUN 10 02/26/2022 1638   CREATININE 0.73 07/28/2022 0744   CREATININE 0.68 05/14/2022 1447   CREATININE 0.63 04/11/2013 0819      Component Value Date/Time   CALCIUM 8.8 (L) 07/28/2022 0744   ALKPHOS 66 05/14/2022 1447   AST 15 05/14/2022 1447   ALT 16 05/14/2022 1447   BILITOT 0.4 05/14/2022 1447      RADIOGRAPHIC STUDIES: I have personally reviewed the radiological images as listed and agreed with the findings in the report. No results found.  All questions were answered. The patient knows to call the clinic with any problems, questions or concerns.     Rachel Moulds, MD 09/19/2022 9:11 AM

## 2022-09-20 LAB — ERYTHROPOIETIN: Erythropoietin: 20.3 m[IU]/mL — ABNORMAL HIGH (ref 2.6–18.5)

## 2022-09-24 LAB — BCR ABL1 FISH (GENPATH)

## 2022-09-24 LAB — JAK2 (INCLUDING V617F AND EXON 12), MPL,& CALR W/RFL MPN PANEL (NGS)

## 2022-09-29 ENCOUNTER — Other Ambulatory Visit: Payer: Self-pay | Admitting: Internal Medicine

## 2022-09-30 MED ORDER — OMNIPOD 5 DEXG7G6 INTRO GEN 5 KIT: 1.0000 | PACK | 0 refills | Status: DC

## 2022-10-02 ENCOUNTER — Other Ambulatory Visit: Payer: Self-pay

## 2022-10-02 MED ORDER — OMNIPOD 5 DEXG7G6 PODS GEN 5 MISC: 1.0000 | 3 refills | Status: DC

## 2022-10-07 NOTE — Progress Notes (Unsigned)
Patient ID: Mercedes Valdez, female    DOB: 1995/07/23  MRN: [id number]  CC: Annual Physical Exam  Subjective: Mercedes Valdez is a 26 y.o. female who presents for annual physical exam.   Her concerns today include:  Endo - DM HLD - Atorvastatin   Patient Active Problem List   Diagnosis Date Noted   Dyslipidemia 05/09/2021   Type 2 diabetes mellitus with both eyes affected by proliferative retinopathy and macular edema, with long-term current use of insulin (HCC) 05/09/2021   History of herpes simplex infection 10/08/2020   Psychosocial stressors 09/26/2014   Insulin dependent type 2 diabetes mellitus (HCC) 09/06/2014   Multiple drug allergies 02/27/2014   Musculoskeletal pain 04/08/2013   Unspecified gastritis and gastroduodenitis without mention of hemorrhage 04/08/2013   Type 2 diabetes mellitus (HCC) 04/08/2013   Irregular menses 04/08/2013   Dysfunctional uterine bleeding 11/09/2012   PCOS (polycystic ovarian syndrome) 09/29/2012   Asthma      Current Outpatient Medications on File Prior to Visit  Medication Sig Dispense Refill   Iron-Folate-C-Biot-B12-Zinc 160-1.7 MG TABS Take 1 tablet by mouth daily. (Patient not taking: Reported on 04/29/2022) 30 tablet 3   Accu-Chek Softclix Lancets lancets To check blood sugars 4 times a day. Fasting and 2 hours after breakfast, lunch and dinner. 100 each 8   atorvastatin (LIPITOR) 20 MG tablet TAKE 1 TABLET BY MOUTH EVERY DAY 90 tablet 0   Blood Pressure Monitoring (BLOOD PRESSURE KIT) DEVI 1 Device by Does not apply route once a week. Please take blood pressure once a week and record in Babyscripts. 1 each 0   Continuous Blood Gluc Sensor (DEXCOM G6 SENSOR) MISC 1 Device by Does not apply route as directed. 9 each 3   Continuous Blood Gluc Transmit (DEXCOM G6 TRANSMITTER) MISC 1 Device by Does not apply route as directed. 1 each 3   Dulaglutide (TRULICITY) 0.75 MG/0.5ML SOPN Inject 0.75 mg into the skin once a week. 6 mL 3    empagliflozin (JARDIANCE) 25 MG TABS tablet Take 1 tablet (25 mg total) by mouth daily before breakfast. 90 tablet 3   glucose blood (ACCU-CHEK GUIDE) test strip To check blood sugars 4 times a day. Fasting, and 2 hours after Breakfast, Lunch and Dinner 100 each 8   ibuprofen (ADVIL) 800 MG tablet Take 800 mg by mouth 3 (three) times daily.     insulin aspart (NOVOLOG) 100 UNIT/ML injection Max daily 60 units per pump 60 mL 3   Insulin Disposable Pump (OMNIPOD 5 G6 INTRO, GEN 5,) KIT 1 Device by Does not apply route every other day. 1 kit 0   Insulin Disposable Pump (OMNIPOD 5 G6 PODS, GEN 5,) MISC 1 Device by Does not apply route every other day. 30 each 3   Insulin Pen Needle 31G X 5 MM MISC 1 Device by Does not apply route in the morning, at noon, in the evening, and at bedtime. 400 each 2   lidocaine (XYLOCAINE) 2 % solution Use as directed 15 mLs in the mouth or throat every 3 (three) hours as needed for mouth pain. 100 mL 0   Misc. Devices (GOJJI WEIGHT SCALE) MISC 1 Device by Does not apply route once a week. Please take weight and record in Babyscripts once a week. 1 each 0   No current facility-administered medications on file prior to visit.    Allergies  Allergen Reactions   Amoxicillin Hives   Augmentin [Amoxicillin-Pot Clavulanate] Hives   Penicillins Hives  Has patient had a PCN reaction causing immediate rash, facial/tongue/throat swelling, SOB or lightheadedness with hypotension: No Has patient had a PCN reaction causing severe rash involving mucus membranes or skin necrosis: No Has patient had a PCN reaction that required hospitalization No Has patient had a PCN reaction occurring within the last 10 years: No If all of the above answers are "NO", then may proceed with Cephalosporin use.   Shellfish Allergy Swelling and Other (See Comments)    Itching lips   Suprax [Cefixime]     Pt unsure reaction, may have been childhood reaction   Latex Rash    Social History    Socioeconomic History   Marital status: Single    Spouse name: Not on file   Number of children: Not on file   Years of education: 11   Highest education level: Not on file  Occupational History   Occupation: Company secretary    Comment: [employer]  Tobacco Use   Smoking status: Every Day    Packs/day: 0.25    Years: 6.00    Additional pack years: 0.00    Total pack years: 1.50    Types: Cigarettes    Passive exposure: Current   Smokeless tobacco: Never   Tobacco comments:    11-16-2020  per pt 3 cig per day  Vaping Use   Vaping Use: Former   Devices: uses different types  Substance and Sexual Activity   Alcohol use: Yes    Comment: socially   Drug use: Not Currently    Types: Marijuana   Sexual activity: Yes    Birth control/protection: None, Injection  Other Topics Concern   Not on file  Social History Narrative   Not on file   Social Determinants of Health   Financial Resource Strain: Not on file  Food Insecurity: No Food Insecurity (01/21/2021)   Hunger Vital Sign    Worried About Running Out of Food in the Last Year: Never true    Ran Out of Food in the Last Year: Never true  Transportation Needs: No Transportation Needs (01/21/2021)   PRAPARE - Administrator, Civil Service (Medical): No    Lack of Transportation (Non-Medical): No  Physical Activity: Not on file  Stress: Not on file  Social Connections: Not on file  Intimate Partner Violence: Not on file    Family History  Problem Relation Age of Onset   Diabetes Mother    Vision loss Mother    ADD / ADHD Brother    Allergies Brother    Asthma Brother    Vision loss Brother    Cancer Paternal Grandfather        Colon Cancer   Heart disease Father 88       died from MI at age 25    Past Surgical History:  Procedure Laterality Date   DILATATION & CURETTAGE/HYSTEROSCOPY WITH MYOSURE N/A 11/21/2020   Procedure: DILATATION & CURETTAGE/HYSTEROSCOPY WITH MYOSURE;  Surgeon: [place] Bing, MD;   Location: [facility];  Service: Gynecology;  Laterality: N/A;   INJECTION OF SILICONE OIL Right 02/07/2021   Procedure: INJECTION OF SILICONE OIL;  Surgeon: Rennis Chris, MD;  Location: [facility] OR;  Service: Ophthalmology;  Laterality: Right;   KNEE ARTHROSCOPY W/ ACL RECONSTRUCTION Left 2011   MEMBRANE PEEL Right 02/07/2021   Procedure: MEMBRANE PEEL;  Surgeon: Rennis Chris, MD;  Location: [facility] OR;  Service: Ophthalmology;  Laterality: Right;   PARS PLANA VITRECTOMY Right 02/07/2021   Procedure: TWENTY-FIVE  GAUGE PARS PLANA VITRECTOMY;  Surgeon: Rennis Chris, MD;  Location: [facility] OR;  Service: Ophthalmology;  Laterality: Right;   PERFLUORONE INJECTION Right 02/07/2021   Procedure: PERFLUORON INJECTION;  Surgeon: Rennis Chris, MD;  Location: [facility] OR;  Service: Ophthalmology;  Laterality: Right;   PHOTOCOAGULATION WITH LASER Right 02/07/2021   Procedure: PHOTOCOAGULATION WITH LASER;  Surgeon: Rennis Chris, MD;  Location: [facility] OR;  Service: Ophthalmology;  Laterality: Right;   TYMPANOSTOMY TUBE PLACEMENT Bilateral    child    ROS: Review of Systems Negative except as stated above  PHYSICAL EXAM: There were no vitals taken for this visit.  Physical Exam  {female adult master:310786} {female adult master:310785}     Latest Ref Rng & Units 09/19/2022    9:17 AM 07/28/2022    7:44 AM 05/14/2022    2:47 PM  CMP  Glucose 70 - 99 mg/dL 161  096  045   BUN 6 - 20 mg/dL 7  12  15    Creatinine 0.44 - 1.00 mg/dL 4.09  8.11  9.14   Sodium 135 - 145 mmol/L 140  134  140   Potassium 3.5 - 5.1 mmol/L 3.8  3.6  3.6   Chloride 98 - 111 mmol/L 105  103  108   CO2 22 - 32 mmol/L 27  22  23    Calcium 8.9 - 10.3 mg/dL 9.7  8.8  9.0   Total Protein 6.5 - 8.1 g/dL 8.3   7.8   Total Bilirubin 0.3 - 1.2 mg/dL 0.6   0.4   Alkaline Phos 38 - 126 U/L 82   66   AST 15 - 41 U/L 14   15   ALT 0 - 44 U/L 13   16    Lipid Panel     Component Value Date/Time   CHOL 175 02/26/2022 1638   TRIG 171 (H)  02/26/2022 1638   HDL 42 02/26/2022 1638   CHOLHDL 4.2 02/26/2022 1638   CHOLHDL 5 05/08/2021 1449   VLDL 36.6 05/08/2021 1449   LDLCALC 103 (H) 02/26/2022 1638    CBC    Component Value Date/Time   WBC 8.5 09/19/2022 0917   RBC 4.77 09/19/2022 0917   HGB 12.6 09/19/2022 0917   HGB 12.0 02/26/2022 1638   HCT 38.8 09/19/2022 0917   HCT 37.9 02/26/2022 1638   PLT 611 (H) 09/19/2022 0917   PLT 628 (H) 02/26/2022 1638   MCV 81.3 09/19/2022 0917   MCV 78 (L) 02/26/2022 1638   MCH 26.4 09/19/2022 0917   MCHC 32.5 09/19/2022 0917   RDW 14.8 09/19/2022 0917   RDW 15.8 (H) 02/26/2022 1638   LYMPHSABS 4.1 (H) 09/19/2022 0917   LYMPHSABS 3.8 (H) 11/01/2018 1127   MONOABS 0.3 09/19/2022 0917   EOSABS 0.3 09/19/2022 0917   EOSABS 0.2 11/01/2018 1127   BASOSABS 0.0 09/19/2022 0917   BASOSABS 0.0 11/01/2018 1127    ASSESSMENT AND PLAN:  There are no diagnoses linked to this encounter.   Patient was given the opportunity to ask questions.  Patient verbalized understanding of the plan and was able to repeat key elements of the plan. Patient was given clear instructions to go to Emergency Department or return to medical center if symptoms don't improve, worsen, or new problems develop.The patient verbalized understanding.   No orders of the defined types were placed in this encounter.    Requested Prescriptions    No prescriptions requested or ordered in this encounter    No follow-ups  on file.  Camillia Herter, NP

## 2022-10-08 ENCOUNTER — Ambulatory Visit (INDEPENDENT_AMBULATORY_CARE_PROVIDER_SITE_OTHER): Payer: Medicaid Other | Admitting: Family

## 2022-10-08 ENCOUNTER — Other Ambulatory Visit (HOSPITAL_COMMUNITY)
Admission: RE | Admit: 2022-10-08 | Discharge: 2022-10-08 | Disposition: A | Discharge status: Home / Self Care | Payer: Medicaid Other | Source: Ambulatory Visit | Attending: Family | Admitting: Family

## 2022-10-08 ENCOUNTER — Encounter: Payer: Self-pay | Admitting: Family

## 2022-10-08 VITALS — BP 121/85 | HR 95 | Temp 98.6°F | Resp 16 | Ht 65.0 in | Wt 244.0 lb

## 2022-10-08 DIAGNOSIS — Z1329 Encounter for screening for other suspected endocrine disorder: Secondary | ICD-10-CM

## 2022-10-08 DIAGNOSIS — Z113 Encounter for screening for infections with a predominantly sexual mode of transmission: Secondary | ICD-10-CM | POA: Diagnosis present

## 2022-10-08 DIAGNOSIS — Z0000 Encounter for general adult medical examination without abnormal findings: Secondary | ICD-10-CM

## 2022-10-08 DIAGNOSIS — Z7984 Long term (current) use of oral hypoglycemic drugs: Secondary | ICD-10-CM

## 2022-10-08 DIAGNOSIS — Z794 Long term (current) use of insulin: Secondary | ICD-10-CM

## 2022-10-08 DIAGNOSIS — E785 Hyperlipidemia, unspecified: Secondary | ICD-10-CM | POA: Diagnosis not present

## 2022-10-08 DIAGNOSIS — F17210 Nicotine dependence, cigarettes, uncomplicated: Secondary | ICD-10-CM | POA: Diagnosis not present

## 2022-10-08 MED ORDER — ATORVASTATIN CALCIUM 20 MG PO TABS: 20.0000 mg | ORAL_TABLET | Freq: Every day | ORAL | 0 refills | Status: DC

## 2022-10-08 NOTE — Patient Instructions (Signed)

## 2022-10-08 NOTE — Addendum Note (Signed)
Addended by: Guy Franco on: 10/08/2022 04:29 PM   Modules accepted: Orders

## 2022-10-08 NOTE — Progress Notes (Signed)
Physical today Request STD testing

## 2022-10-09 ENCOUNTER — Other Ambulatory Visit: Payer: Self-pay | Admitting: Family

## 2022-10-09 DIAGNOSIS — E785 Hyperlipidemia, unspecified: Secondary | ICD-10-CM

## 2022-10-09 LAB — CERVICOVAGINAL ANCILLARY ONLY
Bacterial Vaginitis (gardnerella): NEGATIVE
Candida Glabrata: NEGATIVE
Candida Vaginitis: POSITIVE — AB
Chlamydia: NEGATIVE
Comment: NEGATIVE
Comment: NEGATIVE
Comment: NEGATIVE
Comment: NEGATIVE
Comment: NEGATIVE
Comment: NORMAL
Neisseria Gonorrhea: NEGATIVE
Trichomonas: NEGATIVE

## 2022-10-09 LAB — LIPID PANEL
Chol/HDL Ratio: 4.5 ratio — ABNORMAL HIGH (ref 0.0–4.4)
Cholesterol, Total: 213 mg/dL — ABNORMAL HIGH (ref 100–199)
HDL: 47 mg/dL (ref >39)
LDL Chol Calc (NIH): 144 mg/dL — ABNORMAL HIGH (ref 0–99)
Triglycerides: 124 mg/dL (ref 0–149)
VLDL Cholesterol Cal: 22 mg/dL (ref 5–40)

## 2022-10-09 LAB — TSH: TSH: 1.01 u[IU]/mL (ref 0.450–4.500)

## 2022-10-09 LAB — HIV ANTIBODY (ROUTINE TESTING W REFLEX): HIV Screen 4th Generation wRfx: NONREACTIVE

## 2022-10-09 MED ORDER — ATORVASTATIN CALCIUM 40 MG PO TABS: 40.0000 mg | ORAL_TABLET | Freq: Every day | ORAL | 0 refills | Status: DC

## 2022-10-10 ENCOUNTER — Other Ambulatory Visit: Payer: Self-pay | Admitting: Internal Medicine

## 2022-10-10 ENCOUNTER — Other Ambulatory Visit: Payer: Self-pay | Admitting: Family

## 2022-10-10 DIAGNOSIS — E1165 Type 2 diabetes mellitus with hyperglycemia: Secondary | ICD-10-CM

## 2022-10-10 DIAGNOSIS — B3731 Acute candidiasis of vulva and vagina: Secondary | ICD-10-CM

## 2022-10-10 MED ORDER — FLUCONAZOLE 150 MG PO TABS: 150.0000 mg | ORAL_TABLET | Freq: Once | ORAL | 0 refills | Status: AC

## 2022-10-13 ENCOUNTER — Other Ambulatory Visit: Payer: Self-pay

## 2022-10-13 MED ORDER — OMNIPOD 5 DEXG7G6 PODS GEN 5 MISC: 1.0000 | 3 refills | Status: DC

## 2022-10-21 DIAGNOSIS — S63621A Sprain of interphalangeal joint of right thumb, initial encounter: Secondary | ICD-10-CM | POA: Diagnosis not present

## 2022-10-27 ENCOUNTER — Other Ambulatory Visit (HOSPITAL_COMMUNITY): Payer: Self-pay

## 2022-11-30 ENCOUNTER — Encounter (HOSPITAL_COMMUNITY): Payer: Self-pay | Admitting: *Deleted

## 2022-11-30 ENCOUNTER — Other Ambulatory Visit: Payer: Self-pay

## 2022-11-30 ENCOUNTER — Ambulatory Visit (HOSPITAL_COMMUNITY)
Admission: EM | Admit: 2022-11-30 | Discharge: 2022-11-30 | Disposition: A | Discharge status: Home / Self Care | Payer: Medicaid Other | Attending: Emergency Medicine | Admitting: Emergency Medicine

## 2022-11-30 DIAGNOSIS — L02411 Cutaneous abscess of right axilla: Secondary | ICD-10-CM | POA: Diagnosis not present

## 2022-11-30 DIAGNOSIS — L02412 Cutaneous abscess of left axilla: Secondary | ICD-10-CM

## 2022-11-30 MED ORDER — DOXYCYCLINE HYCLATE 100 MG PO CAPS: 100.0000 mg | ORAL_CAPSULE | Freq: Two times a day (BID) | ORAL | 0 refills | Status: DC

## 2022-11-30 MED ORDER — LIDOCAINE-EPINEPHRINE 1 %-1:100000 IJ SOLN
INTRAMUSCULAR | Status: AC
  Filled 2022-11-30: qty 1

## 2022-11-30 MED ORDER — CHLORHEXIDINE GLUCONATE 4 % EX SOLN: Freq: Every day | CUTANEOUS | 0 refills | Status: AC | PRN

## 2022-11-30 NOTE — Discharge Instructions (Addendum)
We drained an abscess of your left axilla today in clinic.  It may continue to drain throughout the day, as we left it open.  Please use warm compresses with the Hibiclens solution 2-3 times daily to your abscess sites.  Please take antibiotics as prescribed and until finished, take them with food to prevent gastrointestinal upset.  This antibiotic may make you more prone to sunburn, ensure you are using sun protection when outside.  Follow-up with your primary care, who can consider referral to dermatology for your recurrent abscesses.  Please return to clinic or seek immediate care if you develop fever, worsening of swelling, nausea, vomiting, or any new concerning symptoms.

## 2022-11-30 NOTE — ED Triage Notes (Signed)
Pt reports she has multiple abscess . Pt reports one is in Lt axilla and lower ABD. Abscess started about one week ago.

## 2022-11-30 NOTE — ED Provider Notes (Signed)
[facility]    CSN: [id number] Arrival date & time: 11/30/22  1003      History   Chief Complaint Chief Complaint  Patient presents with   Abscess    HPI DELSA MILNOR is a 27 y.o. female.   Patient presents to clinic for concerns of abscesses to her bilateral axilla and her pubic area that have been ongoing for the past week.  Reports she has had some drainage from her right axilla and groin area.  The abscess to her left axilla is the largest and is tender.  Has been using warm compresses without much relief.  She denies any fevers, nausea, vomiting or diarrhea.  She does have a history of PCOS, type 2 diabetes, and dyslipidemia.    The history is provided by the patient and medical records.  Abscess Associated symptoms: no fever     Past Medical History:  Diagnosis Date   ADHD (attention deficit hyperactivity disorder)    Asthma    Childhood   Diabetic retinopathy (HCC)    Elevated blood pressure reading without diagnosis of hypertension    Endometrial mass    History of 2019 novel coronavirus disease (COVID-19) 03/09/2020   positive result in epic,  per pt mild to moderate symptoms that resolved   History of asthma    child   History of gonorrhea 2016   Hypertensive retinopathy    Insulin dependent type 2 diabetes mellitus (HCC)    followd by pcp---- (11-16-2020 per pt checks blood sugar at home 4-5 times daily,  fasting sugar--- 70--95)   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 See comment>>a molar pregnancy is not excluded; correlation with beta-HCG is  recommended.  5/21: WUJWJ 19,147    PCOS (polycystic ovarian syndrome)    Retinopathy of both eyes    followed by dr Vanessa Barbara---  right eye proliferative w/ macular edema;  left eye severe non-proliferative  w/ macular edema    Patient Active Problem List   Diagnosis Date Noted   Candida vaginitis 10/10/2022   Dyslipidemia 05/09/2021   Type 2 diabetes mellitus with both eyes affected by proliferative retinopathy and macular edema, with long-term current use of insulin (HCC) 05/09/2021   History of herpes simplex infection 10/08/2020   Psychosocial stressors 09/26/2014   Insulin dependent type 2 diabetes mellitus (HCC) 09/06/2014   Multiple drug allergies 02/27/2014   Musculoskeletal pain 04/08/2013   Unspecified gastritis and gastroduodenitis without mention of hemorrhage 04/08/2013   Type 2 diabetes mellitus (HCC) 04/08/2013   Irregular menses 04/08/2013   Dysfunctional uterine bleeding 11/09/2012   PCOS (polycystic ovarian syndrome) 09/29/2012   Asthma     Past Surgical History:  Procedure Laterality Date   DILATATION & CURETTAGE/HYSTEROSCOPY WITH MYOSURE N/A 11/21/2020   Procedure: DILATATION & CURETTAGE/HYSTEROSCOPY WITH MYOSURE;  Surgeon: [place] Bing, MD;  Location: [facility];  Service: Gynecology;  Laterality: N/A;   INJECTION OF SILICONE OIL Right 02/07/2021   Procedure: INJECTION OF SILICONE OIL;  Surgeon: Rennis Chris, MD;  Location: [facility] OR;  Service: Ophthalmology;  Laterality: Right;   KNEE ARTHROSCOPY W/ ACL RECONSTRUCTION Left 2011   MEMBRANE PEEL Right 02/07/2021   Procedure: MEMBRANE PEEL;  Surgeon: Rennis Chris, MD;  Location: [facility] OR;  Service: Ophthalmology;  Laterality: Right;   PARS PLANA VITRECTOMY Right 02/07/2021  Procedure: TWENTY-FIVE  GAUGE PARS PLANA VITRECTOMY;  Surgeon: Rennis Chris, MD;  Location: [facility] OR;  Service: Ophthalmology;  Laterality: Right;   PERFLUORONE INJECTION Right 02/07/2021   Procedure: PERFLUORON INJECTION;  Surgeon: Rennis Chris, MD;  Location: [facility] OR;  Service: Ophthalmology;  Laterality: Right;   PHOTOCOAGULATION WITH LASER Right 02/07/2021   Procedure: PHOTOCOAGULATION WITH LASER;  Surgeon: Rennis Chris, MD;  Location:  [facility] OR;  Service: Ophthalmology;  Laterality: Right;   TYMPANOSTOMY TUBE PLACEMENT Bilateral    child    OB History     Gravida  1   Para      Term      Preterm      AB  1   Living         SAB  0   IAB      Ectopic      Multiple      Live Births           Obstetric Comments  G1: possible  molar pregnancy at 5wks based on path, SAB          Home Medications    Prior to Admission medications   Medication Sig Start Date End Date Taking? Authorizing Provider  Accu-Chek Softclix Lancets lancets To check blood sugars 4 times a day. Fasting and 2 hours after breakfast, lunch and dinner. 02/25/21  Yes Zonia Kief, Amy J, NP  atorvastatin (LIPITOR) 40 MG tablet Take 1 tablet (40 mg total) by mouth daily. 10/09/22  Yes Zonia Kief, Amy J, NP  Blood Pressure Monitoring (BLOOD PRESSURE KIT) DEVI 1 Device by Does not apply route once a week. Please take blood pressure once a week and record in Babyscripts. 10/08/20  Yes Venora Maples, MD  chlorhexidine (HIBICLENS) 4 % external liquid Apply topically daily as needed. 11/30/22  Yes Rinaldo Ratel, [place] N, FNP  Continuous Blood Gluc Sensor (DEXCOM G6 SENSOR) MISC 1 Device by Does not apply route as directed. 05/26/22  Yes Shamleffer, Konrad Dolores, MD  Continuous Blood Gluc Transmit (DEXCOM G6 TRANSMITTER) MISC 1 Device by Does not apply route as directed. 05/26/22  Yes Shamleffer, Konrad Dolores, MD  doxycycline (VIBRAMYCIN) 100 MG capsule Take 1 capsule (100 mg total) by mouth 2 (two) times daily. 11/30/22  Yes Rinaldo Ratel, [place] N, FNP  empagliflozin (JARDIANCE) 25 MG TABS tablet Take 1 tablet (25 mg total) by mouth daily before breakfast. 04/29/22  Yes Shamleffer, Konrad Dolores, MD  glucose blood (ACCU-CHEK GUIDE) test strip To check blood sugars 4 times a day. Fasting, and 2 hours after Breakfast, Lunch and Dinner 02/25/21  Yes Zonia Kief, Amy J, NP  ibuprofen (ADVIL) 800 MG tablet Take 800 mg by mouth 3 (three) times daily. 04/22/21  Yes  [provider]  insulin aspart (NOVOLOG) 100 UNIT/ML injection Max daily 60 units per pump 04/29/22  Yes Shamleffer, Konrad Dolores, MD  Insulin Disposable Pump (OMNIPOD 5 G6 INTRO, GEN 5,) KIT 1 Device by Does not apply route every other day. 09/30/22  Yes Shamleffer, Konrad Dolores, MD  Insulin Disposable Pump (OMNIPOD 5 G6 PODS, GEN 5,) MISC 1 Device by Does not apply route every other day. 10/13/22  Yes Shamleffer, Konrad Dolores, MD  Insulin Pen Needle 31G X 5 MM MISC 1 Device by Does not apply route in the morning, at noon, in the evening, and at bedtime. 05/08/21  Yes Shamleffer, Konrad Dolores, MD  Misc. Devices (GOJJI WEIGHT SCALE) MISC 1 Device by Does not apply route once a week. Please take weight  and record in Babyscripts once a week. 10/08/20  Yes Venora Maples, MD    Family History Family History  Problem Relation Age of Onset   Diabetes Mother    Vision loss Mother    ADD / ADHD Brother    Allergies Brother    Asthma Brother    Vision loss Brother    Cancer Paternal Grandfather        Colon Cancer   Heart disease Father 30       died from MI at age 26    Social History Social History   Tobacco Use   Smoking status: Every Day    Packs/day: 0.25    Years: 6.00    Additional pack years: 0.00    Total pack years: 1.50    Types: Cigarettes    Passive exposure: Current   Smokeless tobacco: Never   Tobacco comments:    11-16-2020  per pt 3 cig per day  Vaping Use   Vaping Use: Former   Devices: uses different types  Substance Use Topics   Alcohol use: Yes    Comment: socially   Drug use: Not Currently    Types: Marijuana     Allergies   Amoxicillin, Augmentin [amoxicillin-pot clavulanate], Penicillins, Shellfish allergy, Suprax [cefixime], and Latex   Review of Systems Review of Systems  Constitutional:  Negative for fever.     Physical Exam Triage Vital Signs ED Triage Vitals  Enc Vitals Group     BP 11/30/22 1023 128/84     Pulse  Rate 11/30/22 1023 81     Resp 11/30/22 1023 20     Temp 11/30/22 1023 98.8 F (37.1 C)     Temp src --      SpO2 11/30/22 1023 97 %     Weight --      Height --      Head Circumference --      Peak Flow --      Pain Score 11/30/22 1020 8     Pain Loc --      Pain Edu? --      Excl. in GC? --    No data found.  Updated Vital Signs BP 128/84   Pulse 81   Temp 98.8 F (37.1 C)   Resp 20   LMP 10/12/2022 Comment: irregular  SpO2 97%   Visual Acuity Right Eye Distance:   Left Eye Distance:   Bilateral Distance:    Right Eye Near:   Left Eye Near:    Bilateral Near:     Physical Exam Vitals and nursing note reviewed.  Constitutional:      Appearance: Normal appearance.  HENT:     Head: Normocephalic and atraumatic.     Right Ear: External ear normal.     Left Ear: External ear normal.     Nose: Nose normal.     Mouth/Throat:     Mouth: Mucous membranes are moist.  Eyes:     Conjunctiva/sclera: Conjunctivae normal.  Cardiovascular:     Rate and Rhythm: Normal rate.  Pulmonary:     Effort: Pulmonary effort is normal. No respiratory distress.  Musculoskeletal:        General: Normal range of motion.  Skin:    General: Skin is warm and dry.     Findings: Abscess present.          Comments: Multiple abscesses on physical exam.  Abscess to left axilla is the largest with tenderness, induration and  central fluctuance.  Neurological:     General: No focal deficit present.     Mental Status: She is alert and oriented to person, place, and time.  Psychiatric:        Mood and Affect: Mood normal.        Behavior: Behavior is cooperative.      UC Treatments / Results  Labs (all labs ordered are listed, but only abnormal results are displayed) Labs Reviewed - No data to display  EKG   Radiology No results found.  Procedures Incision and Drainage  Date/Time: 11/30/2022 10:55 AM  Performed by: Kauan Kloosterman, [place] N, FNP Authorized by: Auburn Hert, [place]  N, FNP   Consent:    Consent obtained:  Verbal   Consent given by:  Patient   Risks, benefits, and alternatives were discussed: yes     Risks discussed:  Bleeding, incomplete drainage and pain   Alternatives discussed:  Delayed treatment and no treatment Universal protocol:    Procedure explained and questions answered to patient or proxy's satisfaction: yes     Patient identity confirmed:  Verbally with patient Location:    Type:  Abscess   Size:  6cm x 4cm   Location:  Trunk   Trunk location: L axilla. Pre-procedure details:    Skin preparation:  Antiseptic wash and chlorhexidine with alcohol Sedation:    Sedation type:  None Anesthesia:    Anesthesia method:  Local infiltration   Local anesthetic:  Lidocaine 1% WITH epi Procedure type:    Complexity:  Simple Procedure details:    Incision types:  Single straight   Wound management:  Probed and deloculated   Drainage:  Purulent and bloody   Drainage amount:  Moderate   Wound treatment:  Wound left open   Packing materials:  None Post-procedure details:    Procedure completion:  Tolerated well, no immediate complications  (including critical care time)  Medications Ordered in UC Medications - No data to display  Initial Impression / Assessment and Plan / UC Course  I have reviewed the triage vital signs and the nursing notes.  Pertinent labs & imaging results that were available during my care of the patient were reviewed by me and considered in my medical decision making (see chart for details).  Vitals and triage reviewed, patient is hemodynamically stable.  Multiple abscesses present, left axilla abscess being the largest.  It is tender and fluctuant.  Area cleaned, numbed and drained.  See procedure note for further details.  Patient placed on doxycycline and encouraged to follow-up with primary care or clinic for reevaluation if needed.  Plan of care, follow-up care and return precautions given, no questions at this  time.     Final Clinical Impressions(s) / UC Diagnoses   Final diagnoses:  Abscess of left axilla  Abscess of axilla, right     Discharge Instructions      We drained an abscess of your left axilla today in clinic.  It may continue to drain throughout the day, as we left it open.  Please use warm compresses with the Hibiclens solution 2-3 times daily to your abscess sites.  Please take antibiotics as prescribed and until finished, take them with food to prevent gastrointestinal upset.  This antibiotic may make you more prone to sunburn, ensure you are using sun protection when outside.  Follow-up with your primary care, who can consider referral to dermatology for your recurrent abscesses.  Please return to clinic or seek immediate care if you develop  fever, worsening of swelling, nausea, vomiting, or any new concerning symptoms.      ED Prescriptions     Medication Sig Dispense Auth. Provider   doxycycline (VIBRAMYCIN) 100 MG capsule Take 1 capsule (100 mg total) by mouth 2 (two) times daily. 20 capsule Rinaldo Ratel, [place] N, [place]   chlorhexidine (HIBICLENS) 4 % external liquid Apply topically daily as needed. 118 mL Tametha Banning, [place] N, [place]      PDMP not reviewed this encounter.   Kilani Joffe, [place] N, [place] 11/30/22 1056

## 2023-03-12 ENCOUNTER — Other Ambulatory Visit: Payer: Self-pay | Admitting: Internal Medicine

## 2023-03-12 ENCOUNTER — Encounter: Payer: Self-pay | Admitting: Internal Medicine

## 2023-03-12 ENCOUNTER — Ambulatory Visit (INDEPENDENT_AMBULATORY_CARE_PROVIDER_SITE_OTHER): Payer: 59 | Admitting: Internal Medicine

## 2023-03-12 ENCOUNTER — Telehealth: Payer: Self-pay

## 2023-03-12 VITALS — BP 130/80 | HR 94 | Ht 65.0 in | Wt 246.0 lb

## 2023-03-12 DIAGNOSIS — Z794 Long term (current) use of insulin: Secondary | ICD-10-CM

## 2023-03-12 DIAGNOSIS — E1165 Type 2 diabetes mellitus with hyperglycemia: Secondary | ICD-10-CM | POA: Diagnosis not present

## 2023-03-12 DIAGNOSIS — E113513 Type 2 diabetes mellitus with proliferative diabetic retinopathy with macular edema, bilateral: Secondary | ICD-10-CM | POA: Diagnosis not present

## 2023-03-12 DIAGNOSIS — E785 Hyperlipidemia, unspecified: Secondary | ICD-10-CM | POA: Diagnosis not present

## 2023-03-12 LAB — POCT GLYCOSYLATED HEMOGLOBIN (HGB A1C): Hemoglobin A1C: 8.1 % — AB (ref 4.0–5.6)

## 2023-03-12 MED ORDER — EMPAGLIFLOZIN 25 MG PO TABS: 25.0000 mg | ORAL_TABLET | Freq: Every day | ORAL | 3 refills | Status: DC

## 2023-03-12 MED ORDER — TIRZEPATIDE 5 MG/0.5ML ~~LOC~~ SOAJ: 5.0000 mg | SUBCUTANEOUS | 3 refills | Status: DC

## 2023-03-12 NOTE — Telephone Encounter (Signed)
Medication Samples have been provided to the patient.  Drug name: Greggory Keen        Strength: 2.5mg        Qty: 1box  LOT: [id number]  Exp.Date: 02/14/2024  Dosing instructions: Inject once weekly  The patient has been instructed regarding the correct time, dose, and frequency of taking this medication, including desired effects and most common side effects.   Jeremian Whitby L Joden Bonsall 10:08 AM 03/12/2023

## 2023-03-12 NOTE — Telephone Encounter (Signed)
PA needed for [facility]

## 2023-03-12 NOTE — Patient Instructions (Addendum)
ENTER 12 grams with each meal from now on (Not 14 anymore)  Start Mounjaro 2.5  mg weekly  Continue Jardiance 25 mg daily      HOW TO TREAT LOW BLOOD SUGARS (Blood sugar LESS THAN 70 MG/DL) Please follow the RULE OF 15 for the treatment of hypoglycemia treatment (when your (blood sugars are less than 70 mg/dL)   STEP 1: Take 15 grams of carbohydrates when your blood sugar is low, which includes:  3-4 GLUCOSE TABS  OR 3-4 OZ OF JUICE OR REGULAR SODA OR ONE TUBE OF GLUCOSE GEL    STEP 2: RECHECK blood sugar in 15 MINUTES STEP 3: If your blood sugar is still low at the 15 minute recheck --> then, go back to STEP 1 and treat AGAIN with another 15 grams of carbohydrates.

## 2023-03-12 NOTE — Progress Notes (Signed)
Name: Mercedes Valdez  MRN/ DOB: [id number], Mar 24, 1996   Age/ Sex: 27 y.o., female    PCP: Rema Fendt, NP   Reason for Endocrinology Evaluation: Type 2 Diabetes Mellitus     Date of Initial Endocrinology Visit: 05/08/2021    PATIENT IDENTIFIER: [name] is a 27 y.o. female with a past medical history of T2DM, HTN. The patient presented for initial endocrinology clinic visit on 05/08/2021  for consultative assistance with her diabetes management.    HPI: Mercedes Valdez was    Diagnosed with DM at age 29 Prior Medications tried/Intolerance: Glipizide, Trulicity - cost prohibitive               Hemoglobin A1c has ranged from 13.2% in 08/2020, peaking at 14.8% in 02/2021.   She had a miscarriage in 09/2020  On her initial visit A1c 11.9%, adjusted MDI, stopped Glimepiride and started Jardiance.    Started on OmniPod in August 2023  Started Trulicity 04/2022 but developed localized pruritus Attempted to prescribe Mounjaro 02/2023  SUBJECTIVE:   During the last visit (04/29/2022): A1c 8.3%    Today (03/12/23): Mercedes Valdez is here for a follow up on diabetes management. She  checks her blood sugars multiple  times daily, through CGM. The patient has not  had hypoglycemic episodes since the last clinic visit.   She continues to follow-up with oncology for thrombocytosis She denies nausea, vomiting or diarrhea  She continues with ophthalmology , no more eye  injection  , recent cataract sx   She developed itching at the site of injection of Trulicity  Denies nausea or vomiting  Denies constipation or diarrhea   This patient with type 2 diabetes is treated with OmniPod (insulin pump). During the visit the pump basal and bolus doses were reviewed including carb/insulin rations and supplemental doses. The clinical list was updated. The glucose meter download was reviewed in detail to determine if the current pump settings are providing the best glycemic control  without excessive hypoglycemia.  Pump and meter download:  Pump   OmniPod Settings   Insulin type   NovoLog   Basal rate       0000 1.4 u/h               I:C ratio       0000- 1:1                  Sensitivity       0000  25      Goal       0000  110            Type & Model of Pump: OmniPod Insulin Type: Currently using NovoLog.  Body mass index is 40.94 kg/m.  PUMP STATISTICS: Average BG: 160 Average Daily Carbs (g): 24.9 Average Total Daily Insulin: 45.7 Average Daily Basal: 27.1 (59 %) Average Daily Bolus: 18.6 (41 %)    HOME DIABETES REGIMEN: Jardiance 25 mg daily  Trulicity 0.75 mg weekly- not taking  Novolog      Statin: no ACE-I/ARB: no Prior Diabetic Education:  yes    CONTINUOUS GLUCOSE MONITORING RECORD INTERPRETATION    Dates of Recording: 10/4-10/17/2024  Sensor description:dexcom   Results statistics:   CGM use % of time 81.2  Average and SD 160/47  Time in range 69%  % Time Above 180 26  % Time above 250 5  % Time Below target 0      Glycemic patterns summary: BGs  are optimal overnight and throughout the day  Hyperglycemic episodes  postprandial   Hypoglycemic episodes occurred n/a  Overnight periods: Optimal      DIABETIC COMPLICATIONS: Microvascular complications:  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 Denies: CKD Last eye exam: Completed 11/20/2021  Macrovascular complications:   Denies: CAD, PVD, CVA   PAST HISTORY: Past Medical History:  Past Medical History:  Diagnosis Date   ADHD (attention deficit hyperactivity disorder)    Asthma    Childhood   Diabetic retinopathy (HCC)    Elevated blood pressure reading without diagnosis of hypertension    Endometrial mass    History of 2019 novel coronavirus disease (COVID-19) 03/09/2020   positive result in epic,  per pt mild to moderate symptoms that resolved   History of asthma    child   History of gonorrhea 2016   Hypertensive  retinopathy    Insulin dependent type 2 diabetes mellitus (HCC)    followd by pcp---- (11-16-2020 per pt checks blood sugar at home 4-5 times daily,  fasting sugar--- 70--95)   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 See comment>>a molar pregnancy is not excluded; correlation with beta-HCG is  recommended.  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   Past Surgical History:  Past Surgical History:  Procedure Laterality Date   DILATATION & CURETTAGE/HYSTEROSCOPY WITH MYOSURE N/A 11/21/2020   Procedure: DILATATION & CURETTAGE/HYSTEROSCOPY WITH MYOSURE;  Surgeon: [place] Bing, MD;  Location: [facility];  Service: Gynecology;  Laterality: N/A;   INJECTION OF SILICONE OIL Right 02/07/2021   Procedure: INJECTION OF SILICONE OIL;  Surgeon: Rennis Chris, MD;  Location: [facility] OR;  Service: Ophthalmology;  Laterality: Right;   KNEE ARTHROSCOPY W/ ACL RECONSTRUCTION Left 2011   MEMBRANE PEEL Right 02/07/2021   Procedure: MEMBRANE PEEL;  Surgeon: Rennis Chris, MD;  Location: [facility] OR;  Service: Ophthalmology;  Laterality: Right;   PARS PLANA VITRECTOMY Right 02/07/2021   Procedure: TWENTY-FIVE  GAUGE PARS PLANA VITRECTOMY;  Surgeon: Rennis Chris, MD;  Location: [facility] OR;  Service: Ophthalmology;  Laterality: Right;   PERFLUORONE INJECTION Right 02/07/2021   Procedure: PERFLUORON INJECTION;  Surgeon: Rennis Chris, MD;  Location: [facility] OR;  Service: Ophthalmology;  Laterality: Right;   PHOTOCOAGULATION WITH LASER Right 02/07/2021   Procedure: PHOTOCOAGULATION WITH LASER;  Surgeon: Rennis Chris, MD;  Location: [facility] OR;  Service: Ophthalmology;   Laterality: Right;   TYMPANOSTOMY TUBE PLACEMENT Bilateral    child    Social History:  reports that she has been smoking cigarettes. She has a 1.5 pack-year smoking history. She has been exposed to tobacco smoke. She has never used smokeless tobacco. She reports current alcohol use. She reports that she does not currently use drugs after having used the following drugs: Marijuana. Family History:  Family History  Problem Relation Age of Onset   Diabetes Mother    Vision loss Mother    ADD / ADHD Brother    Allergies Brother    Asthma Brother    Vision loss Brother    Cancer Paternal Grandfather        Colon Cancer   Heart disease Father 69  died from MI at age 49     HOME MEDICATIONS: Allergies as of 03/12/2023       Reactions   Amoxicillin Hives   Augmentin [amoxicillin-pot Clavulanate] Hives   Penicillins Hives   Has patient had a PCN reaction causing immediate rash, facial/tongue/throat swelling, SOB or lightheadedness with hypotension: No Has patient had a PCN reaction causing severe rash involving mucus membranes or skin necrosis: No Has patient had a PCN reaction that required hospitalization No Has patient had a PCN reaction occurring within the last 10 years: No If all of the above answers are "NO", then may proceed with Cephalosporin use.   Shellfish Allergy Swelling, Other (See Comments)   Itching lips   Suprax [cefixime]    Pt unsure reaction, may have been childhood reaction   Trulicity [dulaglutide] Itching   Latex Rash        Medication List        Accurate as of March 12, 2023  9:30 AM. If you have any questions, ask your nurse or doctor.          Accu-Chek Guide test strip Generic drug: glucose blood To check blood sugars 4 times a day. Fasting, and 2 hours after Breakfast, Lunch and Dinner   Accu-Chek Softclix Lancets lancets To check blood sugars 4 times a day. Fasting and 2 hours after breakfast, lunch and dinner.   atorvastatin  40 MG tablet Commonly known as: LIPITOR Take 1 tablet (40 mg total) by mouth daily.   atorvastatin 20 MG tablet Commonly known as: LIPITOR Take 20 mg by mouth daily.   Blood Pressure Kit Devi 1 Device by Does not apply route once a week. Please take blood pressure once a week and record in Babyscripts.   chlorhexidine 4 % external liquid Commonly known as: Hibiclens Apply topically daily as needed.   Dexcom G6 Sensor Misc 1 Device by Does not apply route as directed.   Dexcom G6 Transmitter Misc 1 Device by Does not apply route as directed.   doxycycline 100 MG capsule Commonly known as: VIBRAMYCIN Take 1 capsule (100 mg total) by mouth 2 (two) times daily.   empagliflozin 25 MG Tabs tablet Commonly known as: Jardiance Take 1 tablet (25 mg total) by mouth daily before breakfast.   Gojji Weight Scale Misc 1 Device by Does not apply route once a week. Please take weight and record in Babyscripts once a week.   ibuprofen 800 MG tablet Commonly known as: ADVIL Take 800 mg by mouth 3 (three) times daily.   insulin aspart 100 UNIT/ML injection Commonly known as: NovoLOG Max daily 60 units per pump   Insulin Pen Needle 31G X 5 MM Misc 1 Device by Does not apply route in the morning, at noon, in the evening, and at bedtime.   Omnipod 5 G6 Intro (Gen 5) Kit 1 Device by Does not apply route every other day.   Omnipod 5 G6 Pods (Gen 5) Misc 1 Device by Does not apply route every other day.   prednisoLONE acetate 1 % ophthalmic suspension Commonly known as: PRED FORTE SMARTSIG:In Eye(s)         ALLERGIES: Allergies  Allergen Reactions   Amoxicillin Hives   Augmentin [Amoxicillin-Pot Clavulanate] Hives   Penicillins Hives    Has patient had a PCN reaction causing immediate rash, facial/tongue/throat swelling, SOB or lightheadedness with hypotension: No Has patient had a PCN reaction causing severe rash involving mucus membranes or skin necrosis: No Has patient had  a PCN reaction that required hospitalization No Has patient had a PCN reaction occurring within the last 10 years: No If all of the above answers are "NO", then may proceed with Cephalosporin use.   Shellfish Allergy Swelling and Other (See Comments)    Itching lips   Suprax [Cefixime]     Pt unsure reaction, may have been childhood reaction   Trulicity [Dulaglutide] Itching   Latex Rash       OBJECTIVE:   VITAL SIGNS: BP 130/80 (BP Location: Left Arm, Patient Position: Sitting, Cuff Size: Large)   Pulse 94   Ht 5\' 5"  (1.651 m)   Wt 246 lb (111.6 kg)   SpO2 99%   BMI 40.94 kg/m    PHYSICAL EXAM:  General: Pt appears well and is in NAD  Neck: General: Supple without adenopathy or carotid bruits. Thyroid: Thyroid size normal.  No goiter or nodules appreciated.   Lungs: Clear with good BS bilat   Heart: RRR   Abdomen: Normoactive bowel sounds, soft, nontender, without masses or organomegaly palpable  Extremities:  Lower extremities - No pretibial edema.   Neuro: MS is good with appropriate affect, pt is alert and Ox3   DM Foot Exam 03/12/2023 The skin of the feet is intact without sores or ulcerations. The pedal pulses are 2+ on right and 2+ on left. The sensation is intact to a screening 5.07, 10 gram monofilament bilaterally   DATA REVIEWED:  Lab Results  Component Value Date   HGBA1C 8.1 (A) 03/12/2023   HGBA1C 8.3 (A) 04/29/2022   HGBA1C 8.2 (A) 12/11/2021    Latest Reference Range & Units 09/19/22 09:17 10/08/22 10:53  Sodium 135 - 145 mmol/L 140   Potassium 3.5 - 5.1 mmol/L 3.8   Chloride 98 - 111 mmol/L 105   CO2 22 - 32 mmol/L 27   Glucose 70 - 99 mg/dL 846 (H)   BUN 6 - 20 mg/dL 7   Creatinine 9.62 - 9.52 mg/dL 8.41   Calcium 8.9 - 32.4 mg/dL 9.7   Anion gap 5 - 15  8   Alkaline Phosphatase 38 - 126 U/L 82   Albumin 3.5 - 5.0 g/dL 4.5   AST 15 - 41 U/L 14 (L)   ALT 0 - 44 U/L 13   Total Protein 6.5 - 8.1 g/dL 8.3 (H)   Total Bilirubin 0.3 - 1.2  mg/dL 0.6   GFR, Estimated >40 mL/min >60   Total CHOL/HDL Ratio 0.0 - 4.4 ratio  4.5 (H)  Cholesterol, Total 100 - 199 mg/dL  102 (H)  HDL Cholesterol >39 mg/dL  47  Triglycerides 0 - 149 mg/dL  725  VLDL Cholesterol Cal 5 - 40 mg/dL  22  LDL Chol Calc (NIH) 0 - 99 mg/dL  366 (H)     ASSESSMENT / PLAN / RECOMMENDATIONS:   1) Type 2 Diabetes Mellitus, poorly controlled, With retinopathic complications - Most recent A1c of 8.1%. Goal A1c <7.0%.     - A1c is trending down but continues to be above goal -She has attempted to use the Trulicity but that caused localized pruritus -Will proceed with Mounjaro, if this does not get approved we will consider Ozempic -She was given #4 pens of Mounjaro 2.5 mg to start -I have advised her to decrease CHO entry to #12 grams with each meal from now on    Pump   OmniPod Settings   Insulin type   NovoLog   Basal rate  0000 1.4 u/h               I:C ratio       0000- 1:1    Enter #12 grams               Sensitivity       0000  25      Goal       0000  110        MEDICATIONS: Continue Jardiance 25 mg daily  Start Mounjaro 2.5 mg weekly for 1 month, then increase to 5 milligram weekly Continue Novolog per pump     EDUCATION / INSTRUCTIONS: BG monitoring instructions: Patient is instructed to check her blood sugars 3 times a day, before meals . Call [place] [facility] if: BG persistently < 70  I reviewed the Rule of 15 for the treatment of hypoglycemia in detail with the patient. Literature supplied.   2) Diabetic complications:  Eye: Does  have known diabetic retinopathy.  Neuro/ Feet: Does not have known diabetic peripheral neuropathy. Renal: Patient does not have known baseline CKD. She is not on an ACEI/ARB at present.    3) Dyslipidemia :   -TG levels have normalized but LDL remains above goal -Will encourage low-fat diet -No indication for statin therapy at this time    Follow-up in 4  months   Signed electronically by: Lyndle Herrlich, MD  [facility] Endocrinology  [facility] [address] [place] [place], [place] [address] Phone: [phone] FAX: [phone]   CC: Rema Fendt, NP [address] [place] [address] Phone: [phone]  Fax: [phone]    Return to Endocrinology clinic as below: Future Appointments  Date Time Provider Department Center  03/23/2023  9:00 AM [facility] None  03/23/2023  9:30 AM Rachel Moulds, MD [facility] None

## 2023-03-13 MED ORDER — DEXCOM G6 SENSOR MISC: 1.0000 | 3 refills | Status: DC

## 2023-03-13 MED ORDER — EMPAGLIFLOZIN 25 MG PO TABS: 25.0000 mg | ORAL_TABLET | Freq: Every day | ORAL | 3 refills | Status: DC

## 2023-03-13 MED ORDER — INSULIN ASPART 100 UNIT/ML IJ SOLN: INTRAMUSCULAR | 3 refills | Status: DC

## 2023-03-13 MED ORDER — OMNIPOD 5 DEXG7G6 PODS GEN 5 MISC: 1.0000 | 3 refills | Status: DC

## 2023-03-13 MED ORDER — DEXCOM G6 TRANSMITTER MISC: 1.0000 | 3 refills | Status: DC

## 2023-03-16 ENCOUNTER — Telehealth: Payer: Self-pay

## 2023-03-16 ENCOUNTER — Other Ambulatory Visit (HOSPITAL_COMMUNITY): Payer: Self-pay

## 2023-03-16 NOTE — Telephone Encounter (Signed)
Pharmacy Patient Advocate Encounter   Received notification from Pt Calls Messages that prior authorization for Mercedes Valdez is required/requested.   Insurance verification completed.   The patient is insured through [employer] and [employer].   Per PA form: Pt must have tried and failed 2 of the preferred products: Byetta, Trulicity, Victoza, Ozempic.   I see she has tried Trulicity but I don't see that she has tried a second alternative. Please advise

## 2023-03-17 MED ORDER — SEMAGLUTIDE(0.25 OR 0.5MG/DOS) 2 MG/3ML ~~LOC~~ SOPN: 0.5000 mg | PEN_INJECTOR | SUBCUTANEOUS | 2 refills | Status: DC

## 2023-03-17 NOTE — Telephone Encounter (Signed)
Patient advised and will pick up new prescription

## 2023-03-18 ENCOUNTER — Telehealth: Payer: Self-pay

## 2023-03-18 ENCOUNTER — Other Ambulatory Visit (HOSPITAL_COMMUNITY): Payer: Self-pay

## 2023-03-18 ENCOUNTER — Encounter: Payer: Self-pay | Admitting: Internal Medicine

## 2023-03-18 NOTE — Telephone Encounter (Signed)
Ozempic needs PA  

## 2023-03-18 NOTE — Telephone Encounter (Signed)
Pharmacy Patient Advocate Encounter   Received notification from Pt Calls Messages that prior authorization for Ozempic is required/requested.   Per test claim: PA required; PA submitted to [facility] via CoverMyMeds Key/confirmation #/EOC [id number] Status is pending   PA has been APPROVED through 03/17/24 PA#:  [id number]

## 2023-03-23 ENCOUNTER — Inpatient Hospital Stay: Payer: 59 | Admitting: Hematology and Oncology

## 2023-03-23 ENCOUNTER — Inpatient Hospital Stay: Payer: 59 | Attending: Hematology and Oncology

## 2023-03-24 ENCOUNTER — Telehealth: Payer: Self-pay | Admitting: Hematology and Oncology

## 2023-03-24 NOTE — Telephone Encounter (Signed)
Called patient to reschedule patient didn't want to reschedule appointment

## 2023-05-11 ENCOUNTER — Encounter: Payer: Self-pay | Admitting: Family

## 2023-05-11 NOTE — Telephone Encounter (Signed)
 Care team updated and letter sent for eye exam notes.

## 2023-05-14 ENCOUNTER — Telehealth: Payer: Self-pay | Admitting: Family

## 2023-05-21 ENCOUNTER — Other Ambulatory Visit: Payer: Self-pay | Admitting: Internal Medicine

## 2023-05-22 ENCOUNTER — Telehealth: Payer: Self-pay | Admitting: Pharmacy Technician

## 2023-05-22 ENCOUNTER — Other Ambulatory Visit (HOSPITAL_COMMUNITY): Payer: Self-pay

## 2023-05-22 NOTE — Telephone Encounter (Signed)
Pharmacy Patient Advocate Encounter  Insurance verification completed.   The patient is insured through [employer]   Ran test claim for [employer]. Currently a quantity of 20 is a 33 day supply (but they will probably have to put it in as 30) and the co-pay is $0.31 .   This test claim was processed through [facility]- copay amounts may vary at other pharmacies due to pharmacy/plan contracts, or as the patient moves through the different stages of their insurance plan.

## 2023-05-22 NOTE — Telephone Encounter (Signed)
Called pharmacy to have them process as a 30 day supply of name brand Novolog. It went through. They will get it corrected.

## 2023-05-22 NOTE — Telephone Encounter (Signed)
Spoke with pharmacy, PA is no longer the issue there is another issue regarding Medicaid. Pharmacy to reach out to patient.

## 2023-06-05 ENCOUNTER — Ambulatory Visit (HOSPITAL_COMMUNITY)
Admission: EM | Admit: 2023-06-05 | Discharge: 2023-06-05 | Disposition: A | Discharge status: Home / Self Care | Payer: 59 | Attending: Family Medicine | Admitting: Family Medicine

## 2023-06-05 ENCOUNTER — Encounter (HOSPITAL_COMMUNITY): Payer: Self-pay

## 2023-06-05 DIAGNOSIS — J069 Acute upper respiratory infection, unspecified: Secondary | ICD-10-CM | POA: Diagnosis not present

## 2023-06-05 MED ORDER — ALBUTEROL SULFATE HFA 108 (90 BASE) MCG/ACT IN AERS: 2.0000 | INHALATION_SPRAY | Freq: Four times a day (QID) | RESPIRATORY_TRACT | 0 refills | Status: AC | PRN

## 2023-06-05 MED ORDER — PREDNISONE 20 MG PO TABS: 40.0000 mg | ORAL_TABLET | Freq: Every day | ORAL | 0 refills | Status: AC

## 2023-06-05 NOTE — ED Provider Notes (Signed)
 [facility]    CSN: [id number] Arrival date & time: 06/05/23  1025      History   Chief Complaint Chief Complaint  Patient presents with   Cough    HPI Mercedes Valdez is a 28 y.o. female.    Cough Associated symptoms: rhinorrhea and sore throat     Patient is here for uri symptoms x 2 days.  Cough, congestion, sore throat.  No fevers/chills.  She has slight sob, but has h/o asthma.  She does not have a inhaler at home currently.  She did have some n/v, but that is improved.  She did take dayqul/nyquil with help.  She works [employer], unsure of sick contacts.       Past Medical History:  Diagnosis Date   ADHD (attention deficit hyperactivity disorder)    Asthma    Childhood   Diabetic retinopathy (HCC)    Elevated blood pressure reading without diagnosis of hypertension    Endometrial mass    History of 2019 novel coronavirus disease (COVID-19) 03/09/2020   positive result in epic,  per pt mild to moderate symptoms that resolved   History of asthma    child   History of gonorrhea 2016   Hypertensive retinopathy    Insulin  dependent type 2 diabetes mellitus (HCC)    followd by pcp---- (11-16-2020 per pt checks blood sugar at home 4-5 times daily,  fasting sugar--- 70--95)   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 See comment>>a molar pregnancy is not excluded; correlation with beta-HCG is  recommended.  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    Patient Active Problem List   Diagnosis Date Noted   Candida vaginitis 10/10/2022   Dyslipidemia 05/09/2021   Type 2 diabetes mellitus with both  eyes affected by proliferative retinopathy and macular edema, with long-term current use of insulin  (HCC) 05/09/2021   History of herpes simplex infection 10/08/2020   Psychosocial stressors 09/26/2014   Insulin  dependent type 2 diabetes mellitus (HCC) 09/06/2014   Multiple drug allergies 02/27/2014   Musculoskeletal pain 04/08/2013   Gastritis and gastroduodenitis 04/08/2013   Type 2 diabetes mellitus (HCC) 04/08/2013   Irregular menses 04/08/2013   Dysfunctional uterine bleeding 11/09/2012   PCOS (polycystic ovarian syndrome) 09/29/2012   Asthma     Past Surgical History:  Procedure Laterality Date   DILATATION & CURETTAGE/HYSTEROSCOPY WITH MYOSURE N/A 11/21/2020   Procedure: DILATATION & CURETTAGE/HYSTEROSCOPY WITH MYOSURE;  Surgeon: Izell Harari, MD;  Location: [facility];  Service: Gynecology;  Laterality: N/A;   INJECTION OF SILICONE OIL Right 02/07/2021   Procedure: INJECTION OF SILICONE OIL;  Surgeon: Valdemar Rogue, MD;  Location: [facility] OR;  Service: Ophthalmology;  Laterality: Right;   KNEE ARTHROSCOPY W/ ACL RECONSTRUCTION Left 2011   MEMBRANE PEEL Right 02/07/2021   Procedure: MEMBRANE PEEL;  Surgeon: Valdemar Rogue, MD;  Location: [facility] OR;  Service: Ophthalmology;  Laterality: Right;   PARS PLANA VITRECTOMY Right 02/07/2021   Procedure: TWENTY-FIVE  GAUGE PARS PLANA VITRECTOMY;  Surgeon: Valdemar Rogue, MD;  Location: [facility] OR;  Service: Ophthalmology;  Laterality: Right;   PERFLUORONE INJECTION Right 02/07/2021  Procedure: PERFLUORON INJECTION;  Surgeon: Valdemar Rogue, MD;  Location: [facility] OR;  Service: Ophthalmology;  Laterality: Right;   PHOTOCOAGULATION WITH LASER Right 02/07/2021   Procedure: PHOTOCOAGULATION WITH LASER;  Surgeon: Valdemar Rogue, MD;  Location: [facility] OR;  Service: Ophthalmology;  Laterality: Right;   TYMPANOSTOMY TUBE PLACEMENT Bilateral    child    OB History     Gravida  1   Para      Term      Preterm      AB  1   Living         SAB   0   IAB      Ectopic      Multiple      Live Births           Obstetric Comments  G1: possible  molar pregnancy at 5wks based on path, SAB          Home Medications    Prior to Admission medications   Medication Sig Start Date End Date Taking? Authorizing Provider  Accu-Chek Softclix Lancets lancets To check blood sugars 4 times a day. Fasting and 2 hours after breakfast, lunch and dinner. 02/25/21   Lorren Greig PARAS, NP  Blood Pressure Monitoring (BLOOD PRESSURE KIT) DEVI 1 Device by Does not apply route once a week. Please take blood pressure once a week and record in Babyscripts. 10/08/20   Lola Donnice HERO, MD  chlorhexidine  (HIBICLENS ) 4 % external liquid Apply topically daily as needed. 11/30/22   Dreama, [place]  N, FNP  Continuous Blood Gluc Sensor (DEXCOM G6 SENSOR) MISC 1 Device by Does not apply route as directed. 05/26/22   Shamleffer, Ibtehal Jaralla, MD  Continuous Blood Gluc Transmit (DEXCOM G6 TRANSMITTER) MISC 1 Device by Does not apply route as directed. 05/26/22   Shamleffer, Ibtehal Jaralla, MD  Continuous Glucose Sensor (DEXCOM G6 SENSOR) MISC 1 Device by Does not apply route as directed. 03/13/23   Shamleffer, Ibtehal Jaralla, MD  Continuous Glucose Transmitter (DEXCOM G6 TRANSMITTER) MISC 1 Device by Does not apply route as directed. 03/13/23   Shamleffer, Ibtehal Jaralla, MD  glucose blood (ACCU-CHEK GUIDE) test strip To check blood sugars 4 times a day. Fasting, and 2 hours after Breakfast, Lunch and Dinner 02/25/21   Lorren Greig PARAS, NP  ibuprofen  (ADVIL ) 800 MG tablet Take 800 mg by mouth 3 (three) times daily. 04/22/21   [provider]  insulin  aspart (NOVOLOG ) 100 UNIT/ML injection MAX DAILY 60 UNITS PER PUMP 05/22/23   Motwani, Komal, MD  Insulin  Disposable Pump (OMNIPOD 5 G6 PODS, GEN 5,) MISC 1 Device by Does not apply route every other day. 03/13/23   Shamleffer, Ibtehal Jaralla, MD  Insulin  Pen Needle 31G X 5 MM MISC 1 Device by Does not apply  route in the morning, at noon, in the evening, and at bedtime. 05/08/21   Shamleffer, Donell Cardinal, MD  Misc. Devices (GOJJI WEIGHT SCALE) MISC 1 Device by Does not apply route once a week. Please take weight and record in Babyscripts once a week. 10/08/20   Lola Donnice HERO, MD  Semaglutide ,0.25 or 0.5MG /DOS, 2 MG/3ML SOPN Inject 0.5 mg into the skin once a week. 03/17/23   Shamleffer, Donell Cardinal, MD    Family History Family History  Problem Relation Age of Onset   Diabetes Mother    Vision loss Mother    ADD / ADHD Brother    Allergies Brother    Asthma Brother    Vision loss Brother  Cancer Paternal Grandfather        Colon Cancer   Heart disease Father 59       died from MI at age 40    Social History Social History   Tobacco Use   Smoking status: Every Day    Current packs/day: 0.25    Average packs/day: 0.3 packs/day for 6.0 years (1.5 ttl pk-yrs)    Types: Cigarettes    Passive exposure: Current   Smokeless tobacco: Never   Tobacco comments:    11-16-2020  per pt 3 cig per day  Vaping Use   Vaping status: Former   Devices: uses different types  Substance Use Topics   Alcohol use: Yes    Comment: socially   Drug use: Not Currently    Types: Marijuana     Allergies   Amoxicillin, Augmentin [amoxicillin-pot clavulanate], Penicillins, Shellfish allergy, Suprax  [cefixime ], Trulicity  [dulaglutide ], and Latex   Review of Systems Review of Systems  Constitutional: Negative.   HENT:  Positive for congestion, rhinorrhea and sore throat.   Respiratory:  Positive for cough.   Gastrointestinal: Negative.   Musculoskeletal: Negative.   Psychiatric/Behavioral: Negative.       Physical Exam Triage Vital Signs ED Triage Vitals  Encounter Vitals Group     BP 06/05/23 1054 (!) 147/94     Systolic BP Percentile --      Diastolic BP Percentile --      Pulse Rate 06/05/23 1054 89     Resp 06/05/23 1054 18     Temp 06/05/23 1054 98.1 F (36.7 C)      Temp Source 06/05/23 1054 Oral     SpO2 06/05/23 1054 97 %     Weight --      Height --      Head Circumference --      Peak Flow --      Pain Score 06/05/23 1055 2     Pain Loc --      Pain Education --      Exclude from Growth Chart --    No data found.  Updated Vital Signs BP (!) 147/94 (BP Location: Left Arm)   Pulse 89   Temp 98.1 F (36.7 C) (Oral)   Resp 18   LMP 05/30/2023   SpO2 97%   Visual Acuity Right Eye Distance:   Left Eye Distance:   Bilateral Distance:    Right Eye Near:   Left Eye Near:    Bilateral Near:     Physical Exam Constitutional:      General: She is not in acute distress.    Appearance: Normal appearance. She is normal weight. She is not ill-appearing.  HENT:     Nose: Congestion present. No rhinorrhea.     Mouth/Throat:     Mouth: Mucous membranes are moist.     Pharynx: No posterior oropharyngeal erythema.  Cardiovascular:     Rate and Rhythm: Normal rate and regular rhythm.  Pulmonary:     Effort: Pulmonary effort is normal.     Breath sounds: Normal breath sounds.  Musculoskeletal:     Cervical back: Normal range of motion and neck supple. No tenderness.  Lymphadenopathy:     Cervical: No cervical adenopathy.  Skin:    General: Skin is warm.  Neurological:     General: No focal deficit present.     Mental Status: She is alert.  Psychiatric:        Mood and Affect: Mood normal.  UC Treatments / Results  Labs (all labs ordered are listed, but only abnormal results are displayed) Labs Reviewed - No data to display  EKG   Radiology No results found.  Procedures Procedures (including critical care time)  Medications Ordered in UC Medications - No data to display  Initial Impression / Assessment and Plan / UC Course  I have reviewed the triage vital signs and the nursing notes.  Pertinent labs & imaging results that were available during my care of the patient were reviewed by me and considered in my medical  decision making (see chart for details).   Final Clinical Impressions(s) / UC Diagnoses   Final diagnoses:  Viral URI with cough     Discharge Instructions      You were seen for upper respiratory symptoms.  I have sent out a low dose steroid, and recommend you use over the counter zyrtec /claritin  to help with your sinus drainage and sore throat.  You may use tylenol  and salt water  gargles as well.  I have refilled your inhaler as well.  Please return if not improving or worsening.     ED Prescriptions     Medication Sig Dispense Auth. Provider   predniSONE  (DELTASONE ) 20 MG tablet Take 2 tablets (40 mg total) by mouth daily for 3 days. 6 tablet Ruari Mudgett, MD   albuterol  (VENTOLIN  HFA) 108 (90 Base) MCG/ACT inhaler Inhale 2 puffs into the lungs every 6 (six) hours as needed for wheezing or shortness of breath. 1 each Darral Longs, MD      PDMP not reviewed this encounter.   Darral Longs, MD 06/05/23 1118

## 2023-06-05 NOTE — ED Triage Notes (Signed)
 Pt c/o cough, nasal congestion, sore throat, nausea, and vomiting x2 days. States able to keep fluids down. States no vomiting today. States is on ozempic which makes her have n/v.

## 2023-06-05 NOTE — Discharge Instructions (Signed)
 You were seen for upper respiratory symptoms.  I have sent out a low dose steroid, and recommend you use over the counter zyrtec /claritin  to help with your sinus drainage and sore throat.  You may use tylenol  and salt water  gargles as well.  I have refilled your inhaler as well.  Please return if not improving or worsening.

## 2023-07-02 ENCOUNTER — Telehealth: Payer: Self-pay

## 2023-07-02 ENCOUNTER — Encounter: Payer: Self-pay | Admitting: Internal Medicine

## 2023-07-02 ENCOUNTER — Other Ambulatory Visit (HOSPITAL_COMMUNITY): Payer: Self-pay

## 2023-07-02 NOTE — Telephone Encounter (Signed)
 Medication Samples have been provided to the patient.  Drug name: Ozempic         Strength: 0.25/0.5mg         Qty: 1 box   LOT: [id number]  Exp.Date: 07/23/2024  Dosing instructions: inject once weekly   The patient has been instructed regarding the correct time, dose, and frequency of taking this medication, including desired effects and most common side effects.   Mercedes Valdez L [name] 9:41 AM 07/02/2023   Patient will come by and pick up sample

## 2023-07-02 NOTE — Telephone Encounter (Signed)
 Pharmacy Patient Advocate Encounter   Received notification from Pt Calls Messages that prior authorization for Ozempic  is required/requested.   Insurance verification completed.   The patient is insured through [employer] .   Per test claim: PA required; PA submitted to above mentioned insurance via CoverMyMeds Key/confirmation #/EOC [id number] Status is pending

## 2023-07-02 NOTE — Telephone Encounter (Signed)
 Patient stating that Ozempic  still needs a PA. Can we check this as one was done in October.

## 2023-07-03 NOTE — Telephone Encounter (Signed)
 Patient picked up sample

## 2023-07-07 ENCOUNTER — Telehealth: Payer: Self-pay | Admitting: Family

## 2023-07-07 MED ORDER — RYBELSUS 3 MG PO TABS: 3.0000 mg | ORAL_TABLET | Freq: Every day | ORAL | 0 refills | Status: DC

## 2023-07-07 MED ORDER — RYBELSUS 7 MG PO TABS: 7.0000 mg | ORAL_TABLET | Freq: Every day | ORAL | 3 refills | Status: DC

## 2023-07-07 NOTE — Telephone Encounter (Signed)
Pharmacy Patient Advocate Encounter  Received notification from [employer] that Prior Authorization for Ozempic has been DENIED.  Full denial letter will be uploaded to the media tab. See denial reason below.

## 2023-07-07 NOTE — Telephone Encounter (Signed)
Patient was identified as falling into the [place] Measure - Diabetes.   Patient was: Appointment scheduled for lab or office visit for A1c.  Appt scheduled with provider Shamleffer, Konrad Dolores, MD on 07/16/23 Last A1C checked with providerShamleffer, Konrad Dolores, MD on 03/12/23

## 2023-07-07 NOTE — Telephone Encounter (Signed)
Patient notified and will pick up medication

## 2023-07-07 NOTE — Addendum Note (Signed)
Addended by: Scarlette Shorts on: 07/07/2023 02:27 PM   Modules accepted: Orders

## 2023-07-16 ENCOUNTER — Telehealth: Payer: Self-pay | Admitting: Internal Medicine

## 2023-07-16 ENCOUNTER — Encounter: Payer: Self-pay | Admitting: Internal Medicine

## 2023-07-16 ENCOUNTER — Telehealth: Payer: 59 | Admitting: Internal Medicine

## 2023-07-16 VITALS — Ht 65.0 in

## 2023-07-16 DIAGNOSIS — E113513 Type 2 diabetes mellitus with proliferative diabetic retinopathy with macular edema, bilateral: Secondary | ICD-10-CM | POA: Diagnosis not present

## 2023-07-16 DIAGNOSIS — E785 Hyperlipidemia, unspecified: Secondary | ICD-10-CM

## 2023-07-16 DIAGNOSIS — Z794 Long term (current) use of insulin: Secondary | ICD-10-CM | POA: Diagnosis not present

## 2023-07-16 DIAGNOSIS — Z7985 Long-term (current) use of injectable non-insulin antidiabetic drugs: Secondary | ICD-10-CM | POA: Diagnosis not present

## 2023-07-16 MED ORDER — INSULIN ASPART 100 UNIT/ML IJ SOLN: INTRAMUSCULAR | 3 refills | Status: AC

## 2023-07-16 MED ORDER — OMNIPOD 5 G7 PODS (GEN 5) MISC: 1.0000 | 3 refills | Status: DC

## 2023-07-16 MED ORDER — DEXCOM G7 SENSOR MISC: 1.0000 | 3 refills | Status: DC

## 2023-07-16 MED ORDER — RYBELSUS 7 MG PO TABS
7.0000 mg | ORAL_TABLET | Freq: Every day | ORAL | 3 refills | Status: DC
Start: 2023-07-16 — End: 2023-11-13

## 2023-07-16 MED ORDER — OMNIPOD 5 G7 INTRO (GEN 5) KIT: 1.0000 | PACK | 0 refills | Status: DC

## 2023-07-16 MED ORDER — EMPAGLIFLOZIN 25 MG PO TABS: 25.0000 mg | ORAL_TABLET | Freq: Every day | ORAL | 3 refills | Status: DC

## 2023-07-16 NOTE — Telephone Encounter (Signed)
Please schedule patient for follow-up in 4 months    Thanks

## 2023-07-16 NOTE — Progress Notes (Signed)
Virtual Visit via Video Note  I connected with Mercedes Valdez on 07/16/23  at 9:30 AM  by a video enabled telemedicine application and verified that I am speaking with the correct person using two identifiers.   I discussed the limitations of evaluation and management by telemedicine and the availability of in person appointments. The patient expressed understanding and agreed to proceed.   -Location of the patient : Work -Location of the provider : Home  -The names of all persons participating in the telemedicine service : Pt and myself           Name: Mercedes Valdez  MRN/ DOB: [id number], 01-02-1996   Age/ Sex: 28 y.o., female    PCP: Rema Fendt, NP   Reason for Endocrinology Evaluation: Type 2 Diabetes Mellitus     Date of Initial Endocrinology Visit: 05/08/2021    PATIENT IDENTIFIER: Mercedes Valdez is a 28 y.o. female with a past medical history of T2DM, HTN. The patient presented for initial endocrinology clinic visit on 05/08/2021  for consultative assistance with her diabetes management.    HPI: Mercedes Valdez was    Diagnosed with DM at age 45 Prior Medications tried/Intolerance: Glipizide, Trulicity - cost prohibitive               Hemoglobin A1c has ranged from 13.2% in 08/2020, peaking at 14.8% in 02/2021.   She had a miscarriage in 09/2020  On her initial visit A1c 11.9%, adjusted MDI, stopped Glimepiride and started Jardiance.    Started on OmniPod in August 2023  Started Trulicity 04/2022 but developed localized pruritus Attempted to prescribe Greggory Keen 02/2023 but this was switched to Ozempic due to insurance issues   SUBJECTIVE:   During the last visit (03/12/2023): A1c 8.1%    Today (07/16/23): Mercedes Valdez is here for a follow up on diabetes management. She  checks her blood sugars multiple  times daily, through CGM. The patient has not  had hypoglycemic episodes since the last clinic visit.  She presented to urgent care with  cough 05/2023 She is not on Rybelsus, but insurance will cover Rybelsus rather than Ozempic, she continues to use Ozempic sample  She does endorse mild symptoms of nausea and vomiting the day after the injection, she does have chronic bowel movement changes, these have not changed  This patient with type 2 diabetes is treated with OmniPod (insulin pump). During the visit the pump basal and bolus doses were reviewed including carb/insulin rations and supplemental doses. The clinical list was updated. The glucose meter download was reviewed in detail to determine if the current pump settings are providing the best glycemic control without excessive hypoglycemia.  Pump and meter download:  Pump   OmniPod Settings   Insulin type   NovoLog   Basal rate       0000 1.4 u/h               I:C ratio       0000- 1:1                  Sensitivity       0000  25      Goal       0000  110            Type & Model of Pump: OmniPod Insulin Type: Currently using NovoLog.  There is no height or weight on file to calculate BMI.  PUMP STATISTICS: Average BG: 135 Average Daily Carbs (  g): 0 Average Total Daily Insulin: 7.1 Average Daily Basal: 7.1 (100 %) Average Daily Bolus: 0 (0 %)    HOME DIABETES REGIMEN: Jardiance 25 mg daily  Rybelsus 7 mg weekly-she is using Ozempic samples 0.5 mg weekly Novolog      Statin: no ACE-I/ARB: no Prior Diabetic Education:  yes    CONTINUOUS GLUCOSE MONITORING RECORD INTERPRETATION    Dates of Recording: 2/7-2/20/20225  Sensor description:dexcom   Results statistics:   CGM use % of time 60.6  Average and SD 135/31  Time in range 90%  % Time Above 180 10  % Time above 250 0  % Time Below target 0      Glycemic patterns summary: BGs are optimal throughout the day and night  Hyperglycemic episodes  postprandial   Hypoglycemic episodes occurred where  Overnight periods: Optimal      DIABETIC  COMPLICATIONS: Microvascular complications:  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 Denies: CKD Last eye exam: Completed 11/20/2021  Macrovascular complications:   Denies: CAD, PVD, CVA   PAST HISTORY: Past Medical History:  Past Medical History:  Diagnosis Date   ADHD (attention deficit hyperactivity disorder)    Asthma    Childhood   Diabetic retinopathy (HCC)    Elevated blood pressure reading without diagnosis of hypertension    Endometrial mass    History of 2019 novel coronavirus disease (COVID-19) 03/09/2020   positive result in epic,  per pt mild to moderate symptoms that resolved   History of asthma    child   History of gonorrhea 2016   Hypertensive retinopathy    Insulin dependent type 2 diabetes mellitus (HCC)    followd by pcp---- (11-16-2020 per pt checks blood sugar at home 4-5 times daily,  fasting sugar--- 70--95)   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 See comment>>a molar pregnancy is not excluded; correlation with beta-HCG is  recommended.  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   Past Surgical History:  Past Surgical History:  Procedure Laterality Date   DILATATION & CURETTAGE/HYSTEROSCOPY WITH MYOSURE N/A 11/21/2020   Procedure: DILATATION & CURETTAGE/HYSTEROSCOPY WITH MYOSURE;  Surgeon: [place] Bing, MD;  Location: [facility];  Service: Gynecology;  Laterality: N/A;   INJECTION OF SILICONE OIL Right 02/07/2021   Procedure: INJECTION OF SILICONE OIL;  Surgeon: Rennis Chris, MD;  Location: [facility] OR;  Service: Ophthalmology;  Laterality: Right;   KNEE ARTHROSCOPY W/ ACL  RECONSTRUCTION Left 2011   MEMBRANE PEEL Right 02/07/2021   Procedure: MEMBRANE PEEL;  Surgeon: Rennis Chris, MD;  Location: [facility] OR;  Service: Ophthalmology;  Laterality: Right;   PARS PLANA VITRECTOMY Right 02/07/2021   Procedure: TWENTY-FIVE  GAUGE PARS PLANA VITRECTOMY;  Surgeon: Rennis Chris, MD;  Location: [facility] OR;  Service: Ophthalmology;  Laterality: Right;   PERFLUORONE INJECTION Right 02/07/2021   Procedure: PERFLUORON INJECTION;  Surgeon: Rennis Chris, MD;  Location: [facility] OR;  Service: Ophthalmology;  Laterality: Right;   PHOTOCOAGULATION WITH LASER Right 02/07/2021   Procedure: PHOTOCOAGULATION WITH LASER;  Surgeon: Rennis Chris, MD;  Location: [facility] OR;  Service: Ophthalmology;  Laterality: Right;   TYMPANOSTOMY TUBE PLACEMENT Bilateral  child    Social History:  reports that she has been smoking cigarettes. She has a 1.5 pack-year smoking history. She has been exposed to tobacco smoke. She has never used smokeless tobacco. She reports current alcohol use. She reports that she does not currently use drugs after having used the following drugs: Marijuana. Family History:  Family History  Problem Relation Age of Onset   Diabetes Mother    Vision loss Mother    ADD / ADHD Brother    Allergies Brother    Asthma Brother    Vision loss Brother    Cancer Paternal Grandfather        Colon Cancer   Heart disease Father 15       died from MI at age 61     HOME MEDICATIONS: Allergies as of 07/16/2023       Reactions   Amoxicillin Hives   Augmentin [amoxicillin-pot Clavulanate] Hives   Penicillins Hives   Has patient had a PCN reaction causing immediate rash, facial/tongue/throat swelling, SOB or lightheadedness with hypotension: No Has patient had a PCN reaction causing severe rash involving mucus membranes or skin necrosis: No Has patient had a PCN reaction that required hospitalization No Has patient had a PCN reaction occurring within the last 10 years: No If all of the above  answers are "NO", then may proceed with Cephalosporin use.   Shellfish Allergy Swelling, Other (See Comments)   Itching lips   Suprax [cefixime]    Pt unsure reaction, may have been childhood reaction   Trulicity [dulaglutide] Itching   Latex Rash        Medication List        Accurate as of July 16, 2023  6:48 AM. If you have any questions, ask your nurse or doctor.          Accu-Chek Guide test strip Generic drug: glucose blood To check blood sugars 4 times a day. Fasting, and 2 hours after Breakfast, Lunch and Dinner   Accu-Chek Softclix Lancets lancets To check blood sugars 4 times a day. Fasting and 2 hours after breakfast, lunch and dinner.   albuterol 108 (90 Base) MCG/ACT inhaler Commonly known as: VENTOLIN HFA Inhale 2 puffs into the lungs every 6 (six) hours as needed for wheezing or shortness of breath.   Blood Pressure Kit Devi 1 Device by Does not apply route once a week. Please take blood pressure once a week and record in Babyscripts.   chlorhexidine 4 % external liquid Commonly known as: Hibiclens Apply topically daily as needed.   Dexcom G6 Sensor Misc 1 Device by Does not apply route as directed.   Dexcom G6 Sensor Misc 1 Device by Does not apply route as directed.   Dexcom G6 Transmitter Misc 1 Device by Does not apply route as directed.   Dexcom G6 Transmitter Misc 1 Device by Does not apply route as directed.   Gojji Weight Scale Misc 1 Device by Does not apply route once a week. Please take weight and record in Babyscripts once a week.   ibuprofen 800 MG tablet Commonly known as: ADVIL Take 800 mg by mouth 3 (three) times daily.   insulin aspart 100 UNIT/ML injection Commonly known as: novoLOG MAX DAILY 60 UNITS PER PUMP   Insulin Pen Needle 31G X 5 MM Misc 1 Device by Does not apply route in the morning, at noon, in the evening, and at bedtime.   Omnipod 5 DexG7G6 Pods Gen 5 Misc 1 Device by  Does not apply route every other  day.   Rybelsus 3 MG Tabs Generic drug: Semaglutide Take 1 tablet (3 mg total) by mouth daily.   Rybelsus 7 MG Tabs Generic drug: Semaglutide Take 1 tablet (7 mg total) by mouth daily.         ALLERGIES: Allergies  Allergen Reactions   Amoxicillin Hives   Augmentin [Amoxicillin-Pot Clavulanate] Hives   Penicillins Hives    Has patient had a PCN reaction causing immediate rash, facial/tongue/throat swelling, SOB or lightheadedness with hypotension: No Has patient had a PCN reaction causing severe rash involving mucus membranes or skin necrosis: No Has patient had a PCN reaction that required hospitalization No Has patient had a PCN reaction occurring within the last 10 years: No If all of the above answers are "NO", then may proceed with Cephalosporin use.   Shellfish Allergy Swelling and Other (See Comments)    Itching lips   Suprax [Cefixime]     Pt unsure reaction, may have been childhood reaction   Trulicity [Dulaglutide] Itching   Latex Rash       OBJECTIVE:   VITAL SIGNS: There were no vitals taken for this visit.   PHYSICAL EXAM:  General: Pt appears well and is in NAD  Neuro: MS is good with appropriate affect, pt is alert and Ox3   DM Foot Exam 03/12/2023 The skin of the feet is intact without sores or ulcerations. The pedal pulses are 2+ on right and 2+ on left. The sensation is intact to a screening 5.07, 10 gram monofilament bilaterally   DATA REVIEWED:  Lab Results  Component Value Date   HGBA1C 8.1 (A) 03/12/2023   HGBA1C 8.3 (A) 04/29/2022   HGBA1C 8.2 (A) 12/11/2021    Latest Reference Range & Units 09/19/22 09:17 10/08/22 10:53  Sodium 135 - 145 mmol/L 140   Potassium 3.5 - 5.1 mmol/L 3.8   Chloride 98 - 111 mmol/L 105   CO2 22 - 32 mmol/L 27   Glucose 70 - 99 mg/dL 366 (H)   BUN 6 - 20 mg/dL 7   Creatinine 4.40 - 3.47 mg/dL 4.25   Calcium 8.9 - 95.6 mg/dL 9.7   Anion gap 5 - 15  8   Alkaline Phosphatase 38 - 126 U/L 82   Albumin  3.5 - 5.0 g/dL 4.5   AST 15 - 41 U/L 14 (L)   ALT 0 - 44 U/L 13   Total Protein 6.5 - 8.1 g/dL 8.3 (H)   Total Bilirubin 0.3 - 1.2 mg/dL 0.6   GFR, Estimated >38 mL/min >60   Total CHOL/HDL Ratio 0.0 - 4.4 ratio  4.5 (H)  Cholesterol, Total 100 - 199 mg/dL  756 (H)  HDL Cholesterol >39 mg/dL  47  Triglycerides 0 - 149 mg/dL  433  VLDL Cholesterol Cal 5 - 40 mg/dL  22  LDL Chol Calc (NIH) 0 - 99 mg/dL  295 (H)     ASSESSMENT / PLAN / RECOMMENDATIONS:   1) Type 2 Diabetes Mellitus. Goal A1c <7.0%.     -Her BG readings have improved dramatically while on Ozempic, time in range went from 69% to 90% -She has not been bolusing, which is fine at this time because her BG's do not trend that high postprandial with Ozempic use. Her insurance is not covering Ozempic, Rybelsus is the preferred medication, she will finish the Ozempic sample and will switch to Rybelsus -Patient advised to contact us with any GI side effects -She developed localized  pruritus to the injection site of Trulicity -I have upgraded her to Dexcom G7 and OmniPod pods G7 -I have emphasized the importance of taking Rybelsus 30 minutes before breakfast     Pump   OmniPod Settings   Insulin type   NovoLog   Basal rate       0000 1.4 u/h               I:C ratio       0000- 1:1                  Sensitivity       0000  25      Goal       0000  110        MEDICATIONS: Continue Jardiance 25 mg daily  Rybelsus 7 mg daily Continue Novolog per pump     EDUCATION / INSTRUCTIONS: BG monitoring instructions: Patient is instructed to check her blood sugars 3 times a day, before meals . Call [facility] [facility] if: BG persistently < 70  I reviewed the Rule of 15 for the treatment of hypoglycemia in detail with the patient. Literature supplied.   2) Diabetic complications:  Eye: Does  have known diabetic retinopathy.  Neuro/ Feet: Does not have known diabetic peripheral neuropathy. Renal: Patient  does not have known baseline CKD. She is not on an ACEI/ARB at present.    3) Dyslipidemia :   -TG levels have normalized but LDL remains above goal -No indication for statin therapy at this time -Will continue to monitor    Follow-up in 4 months   Signed electronically by: Lyndle Herrlich, MD  [facility]  [facility] [address] [place] [place], [place] [address] Phone: [phone] FAX: [phone]   CC: Rema Fendt, NP [address] [place] [address] Phone: [phone]  Fax: [phone]    Return to Endocrinology clinic as below: Future Appointments  Date Time Provider Department Center  07/16/2023  9:30 AM [place] Czerwinski, Konrad Dolores, MD [facility] None  10/09/2023 10:20 AM Rema Fendt, NP [facility] None

## 2023-08-05 ENCOUNTER — Encounter: Payer: Self-pay | Admitting: Internal Medicine

## 2023-08-06 ENCOUNTER — Telehealth: Payer: Self-pay

## 2023-08-06 ENCOUNTER — Other Ambulatory Visit (HOSPITAL_COMMUNITY): Payer: Self-pay

## 2023-08-06 NOTE — Telephone Encounter (Signed)
 Dexcom and Omnipod need PA

## 2023-08-06 NOTE — Telephone Encounter (Signed)
 Pharmacy Patient Advocate Encounter   Received notification from Pt Calls Messages that prior authorization for Dexcom G7 sensor is required/requested.   Insurance verification completed.   The patient is insured through [facility] .   Per test claim: PA required; PA submitted to above mentioned insurance via CoverMyMeds Key/confirmation #/EOC [facility] Status is pending   PA has been APPROVED through 02/02/24 PA Case ID #: [id number]

## 2023-10-09 ENCOUNTER — Ambulatory Visit: Payer: Self-pay | Admitting: Family

## 2023-10-09 ENCOUNTER — Ambulatory Visit (INDEPENDENT_AMBULATORY_CARE_PROVIDER_SITE_OTHER): Payer: 59 | Admitting: Family

## 2023-10-09 VITALS — BP 130/86 | HR 90 | Temp 97.3°F | Resp 16 | Ht 65.0 in | Wt 215.4 lb

## 2023-10-09 DIAGNOSIS — Z130 Encounter for screening for diseases of the blood and blood-forming organs and certain disorders involving the immune mechanism: Secondary | ICD-10-CM | POA: Diagnosis not present

## 2023-10-09 DIAGNOSIS — Z1329 Encounter for screening for other suspected endocrine disorder: Secondary | ICD-10-CM

## 2023-10-09 DIAGNOSIS — Z13228 Encounter for screening for other metabolic disorders: Secondary | ICD-10-CM

## 2023-10-09 DIAGNOSIS — R7989 Other specified abnormal findings of blood chemistry: Secondary | ICD-10-CM

## 2023-10-09 DIAGNOSIS — Z13220 Encounter for screening for lipoid disorders: Secondary | ICD-10-CM | POA: Diagnosis not present

## 2023-10-09 DIAGNOSIS — Z794 Long term (current) use of insulin: Secondary | ICD-10-CM

## 2023-10-09 DIAGNOSIS — E1165 Type 2 diabetes mellitus with hyperglycemia: Secondary | ICD-10-CM

## 2023-10-09 DIAGNOSIS — Z0000 Encounter for general adult medical examination without abnormal findings: Secondary | ICD-10-CM

## 2023-10-09 DIAGNOSIS — R3989 Other symptoms and signs involving the genitourinary system: Secondary | ICD-10-CM | POA: Diagnosis not present

## 2023-10-09 DIAGNOSIS — L0292 Furuncle, unspecified: Secondary | ICD-10-CM

## 2023-10-09 LAB — POCT URINALYSIS DIP (CLINITEK)
Bilirubin, UA: NEGATIVE
Glucose, UA: NEGATIVE mg/dL
Ketones, POC UA: NEGATIVE mg/dL
Leukocytes, UA: NEGATIVE
Nitrite, UA: NEGATIVE
POC PROTEIN,UA: NEGATIVE
Spec Grav, UA: 1.025 (ref 1.010–1.025)
Urobilinogen, UA: 0.2 U/dL
pH, UA: 6 (ref 5.0–8.0)

## 2023-10-09 LAB — POCT GLYCOSYLATED HEMOGLOBIN (HGB A1C): Hemoglobin A1C: 7.6 % — AB (ref 4.0–5.6)

## 2023-10-09 MED ORDER — DOXYCYCLINE HYCLATE 100 MG PO TABS: 100.0000 mg | ORAL_TABLET | Freq: Two times a day (BID) | ORAL | 0 refills | Status: AC

## 2023-10-09 NOTE — Progress Notes (Signed)
 Abscess under arms, possible bladder infection

## 2023-10-09 NOTE — Progress Notes (Signed)
 Patient ID: Mercedes Valdez, female    DOB: 1995/11/24  MRN: [id number]  CC: Annual Exam  Subjective: Mercedes Valdez is a 28 y.o. female who presents for annual exam.   Her concerns today include:  - States boil right underarm present for 3 years and drains frequently. Denies red flag symptoms. Reports family history of hidradenitis suppurativa.  - States urine dark. Denies red flag symptoms. - Established with Endocrinology for diabetes management.   Patient Active Problem List   Diagnosis Date Noted   Candida vaginitis 10/10/2022   Dyslipidemia 05/09/2021   Type 2 diabetes mellitus with both eyes affected by proliferative retinopathy and macular edema, with long-term current use of insulin  (HCC) 05/09/2021   History of herpes simplex infection 10/08/2020   Psychosocial stressors 09/26/2014   Insulin  dependent type 2 diabetes mellitus (HCC) 09/06/2014   Multiple drug allergies 02/27/2014   Musculoskeletal pain 04/08/2013   Gastritis and gastroduodenitis 04/08/2013   Type 2 diabetes mellitus (HCC) 04/08/2013   Irregular menses 04/08/2013   Dysfunctional uterine bleeding 11/09/2012   PCOS (polycystic ovarian syndrome) 09/29/2012   Asthma      Current Outpatient Medications on File Prior to Visit  Medication Sig Dispense Refill   Accu-Chek Softclix Lancets lancets To check blood sugars 4 times a day. Fasting and 2 hours after breakfast, lunch and dinner. 100 each 8   albuterol  (VENTOLIN  HFA) 108 (90 Base) MCG/ACT inhaler Inhale 2 puffs into the lungs every 6 (six) hours as needed for wheezing or shortness of breath. 1 each 0   Blood Pressure Monitoring (BLOOD PRESSURE KIT) DEVI 1 Device by Does not apply route once a week. Please take blood pressure once a week and record in Babyscripts. 1 each 0   chlorhexidine  (HIBICLENS ) 4 % external liquid Apply topically daily as needed. 118 mL 0   Continuous Glucose Sensor (DEXCOM G7 SENSOR) MISC 1 Device by Does not apply route as  directed. 9 each 3   empagliflozin  (JARDIANCE ) 25 MG TABS tablet Take 1 tablet (25 mg total) by mouth daily before breakfast. 90 tablet 3   glucose blood (ACCU-CHEK GUIDE) test strip To check blood sugars 4 times a day. Fasting, and 2 hours after Breakfast, Lunch and Dinner 100 each 8   ibuprofen  (ADVIL ) 800 MG tablet Take 800 mg by mouth 3 (three) times daily.     insulin  aspart (NOVOLOG ) 100 UNIT/ML injection Max daily 40 units per pump 40 mL 3   Insulin  Disposable Pump (OMNIPOD 5 G7 INTRO, GEN 5,) KIT 1 Device by Does not apply route every 3 (three) days. 1 kit 0   Insulin  Disposable Pump (OMNIPOD 5 G7 PODS, GEN 5,) MISC 1 Device by Does not apply route every 3 (three) days. 30 each 3   Misc. Devices (GOJJI WEIGHT SCALE) MISC 1 Device by Does not apply route once a week. Please take weight and record in Babyscripts once a week. 1 each 0   Semaglutide  (RYBELSUS ) 7 MG TABS Take 1 tablet (7 mg total) by mouth daily. 90 tablet 3   No current facility-administered medications on file prior to visit.    Allergies  Allergen Reactions   Amoxicillin Hives   Augmentin [Amoxicillin-Pot Clavulanate] Hives   Penicillins Hives    Has patient had a PCN reaction causing immediate rash, facial/tongue/throat swelling, SOB or lightheadedness with hypotension: No Has patient had a PCN reaction causing severe rash involving mucus membranes or skin necrosis: No Has patient had a PCN reaction that  required hospitalization No Has patient had a PCN reaction occurring within the last 10 years: No If all of the above answers are "NO", then may proceed with Cephalosporin use.   Shellfish Allergy Swelling and Other (See Comments)    Itching lips   Suprax  [Cefixime ]     Pt unsure reaction, may have been childhood reaction   Trulicity  [Dulaglutide ] Itching   Latex Rash    Social History   Socioeconomic History   Marital status: Single    Spouse name: Not on file   Number of children: Not on file   Years of  education: 11   Highest education level: 12th grade  Occupational History   Occupation: Company secretary    Comment: [employer]  Tobacco Use   Smoking status: Every Day    Current packs/day: 0.25    Average packs/day: 0.3 packs/day for 6.0 years (1.5 ttl pk-yrs)    Types: Cigarettes    Passive exposure: Current   Smokeless tobacco: Never   Tobacco comments:    11-16-2020  per pt 3 cig per day  Vaping Use   Vaping status: Former   Devices: uses different types  Substance and Sexual Activity   Alcohol use: Yes    Comment: socially   Drug use: Not Currently    Types: Marijuana   Sexual activity: Yes    Birth control/protection: None  Other Topics Concern   Not on file  Social History Narrative   Not on file   Social Drivers of Health   Financial Resource Strain: Low Risk  (10/09/2023)   Overall Financial Resource Strain (CARDIA)    Difficulty of Paying Living Expenses: Not hard at all  Food Insecurity: No Food Insecurity (10/09/2023)   Hunger Vital Sign    Worried About Running Out of Food in the Last Year: Never true    Ran Out of Food in the Last Year: Never true  Transportation Needs: No Transportation Needs (10/09/2023)   PRAPARE - Administrator, Civil Service (Medical): No    Lack of Transportation (Non-Medical): No  Physical Activity: Sufficiently Active (10/09/2023)   Exercise Vital Sign    Days of Exercise per Week: 7 days    Minutes of Exercise per Session: 150+ min  Stress: No Stress Concern Present (10/09/2023)   [employer] of Occupational Health - Occupational Stress Questionnaire    Feeling of Stress : Not at all  Social Connections: Unknown (10/09/2023)   Social Connection and Isolation Panel [NHANES]    Frequency of Communication with Friends and Family: Three times a week    Frequency of Social Gatherings with Friends and Family: Once a week    Attends Religious Services: Patient declined    Database administrator or Organizations: No     Attends Engineer, structural: Not on file    Marital Status: Never married  Catering manager Violence: Not on file    Family History  Problem Relation Age of Onset   Diabetes Mother    Vision loss Mother    ADD / ADHD Brother    Allergies Brother    Asthma Brother    Vision loss Brother    Cancer Paternal Grandfather        Colon Cancer   Heart disease Father 100       died from MI at age 15    Past Surgical History:  Procedure Laterality Date   DILATATION & CURETTAGE/HYSTEROSCOPY WITH MYOSURE N/A 11/21/2020   Procedure: DILATATION &  CURETTAGE/HYSTEROSCOPY WITH MYOSURE;  Surgeon: Raynell Caller, MD;  Location: [facility];  Service: Gynecology;  Laterality: N/A;   INJECTION OF SILICONE OIL Right 02/07/2021   Procedure: INJECTION OF SILICONE OIL;  Surgeon: Ronelle Coffee, MD;  Location: [facility] OR;  Service: Ophthalmology;  Laterality: Right;   KNEE ARTHROSCOPY W/ ACL RECONSTRUCTION Left 2011   MEMBRANE PEEL Right 02/07/2021   Procedure: MEMBRANE PEEL;  Surgeon: Ronelle Coffee, MD;  Location: [facility] OR;  Service: Ophthalmology;  Laterality: Right;   PARS PLANA VITRECTOMY Right 02/07/2021   Procedure: TWENTY-FIVE  GAUGE PARS PLANA VITRECTOMY;  Surgeon: Ronelle Coffee, MD;  Location: [facility] OR;  Service: Ophthalmology;  Laterality: Right;   PERFLUORONE INJECTION Right 02/07/2021   Procedure: PERFLUORON INJECTION;  Surgeon: Ronelle Coffee, MD;  Location: [facility] OR;  Service: Ophthalmology;  Laterality: Right;   PHOTOCOAGULATION WITH LASER Right 02/07/2021   Procedure: PHOTOCOAGULATION WITH LASER;  Surgeon: Ronelle Coffee, MD;  Location: [facility] OR;  Service: Ophthalmology;  Laterality: Right;   TYMPANOSTOMY TUBE PLACEMENT Bilateral    child    ROS: Review of Systems Negative except as stated above  PHYSICAL EXAM: BP 130/86   Pulse 90   Temp (!) 97.3 F (36.3 C) (Oral)   Resp 16   Ht 5\' 5"  (1.651 m)   Wt 215 lb 6.4 oz (97.7 kg)   LMP 09/25/2023 (Exact Date)   SpO2 98%   BMI  35.84 kg/m   Physical Exam HENT:     Head: Normocephalic and atraumatic.     Right Ear: Tympanic membrane, ear canal and external ear normal.     Left Ear: Tympanic membrane, ear canal and external ear normal.     Nose: Nose normal.     Mouth/Throat:     Mouth: Mucous membranes are moist.     Pharynx: Oropharynx is clear.  Eyes:     Extraocular Movements: Extraocular movements intact.     Conjunctiva/sclera: Conjunctivae normal.     Pupils: Pupils are equal, round, and reactive to light.  Neck:     Thyroid: No thyroid mass, thyromegaly or thyroid tenderness.  Cardiovascular:     Rate and Rhythm: Normal rate and regular rhythm.     Pulses: Normal pulses.     Heart sounds: Normal heart sounds.  Pulmonary:     Effort: Pulmonary effort is normal.     Breath sounds: Normal breath sounds.  Chest:     Comments: Patient declined. Abdominal:     General: Bowel sounds are normal.     Palpations: Abdomen is soft.  Genitourinary:    Comments: Patient declined. Musculoskeletal:        General: Normal range of motion.     Right shoulder: Normal.     Left shoulder: Normal.     Right upper arm: Normal.     Left upper arm: Normal.     Right elbow: Normal.     Left elbow: Normal.     Right forearm: Normal.     Left forearm: Normal.     Right wrist: Normal.     Left wrist: Normal.     Right hand: Normal.     Left hand: Normal.     Cervical back: Normal, normal range of motion and neck supple.     Thoracic back: Normal.     Lumbar back: Normal.     Right hip: Normal.     Left hip: Normal.     Right upper leg: Normal.     Left  upper leg: Normal.     Right knee: Normal.     Left knee: Normal.     Right lower leg: Normal.     Left lower leg: Normal.     Right ankle: Normal.     Left ankle: Normal.     Right foot: Normal.     Left foot: Normal.  Skin:    General: Skin is warm and dry.     Capillary Refill: Capillary refill takes less than 2 seconds.     Comments: Boil with  drainage right underarm.  Neurological:     General: No focal deficit present.     Mental Status: She is alert and oriented to person, place, and time.  Psychiatric:        Mood and Affect: Mood normal.        Behavior: Behavior normal.     ASSESSMENT AND PLAN: 1. Annual physical exam (Primary) - Counseled on 150 minutes of exercise per week as tolerated, healthy eating (including decreased daily intake of saturated fats, cholesterol, added sugars, sodium), STI prevention, and routine healthcare maintenance.  2. Screening for metabolic disorder - Routine screening.  - CMP14+EGFR  3. Screening for deficiency anemia - Routine screening.  - CBC  4. Screening cholesterol level - Routine screening.  - Lipid panel  5. Thyroid disorder screen - Routine screening.  - TSH  6. Type 2 diabetes mellitus with hyperglycemia, with long-term current use of insulin  (HCC) - Routine screening.  - Keep all scheduled appointments with established Endocrinology.  - HgB A1c  7. Boil - Doxycycline  as prescribed. Counseled on medication adherence/adverse effects. - Referral to Dermatology for evaluation/management.  - Follow-up with primary provider as scheduled. - doxycycline  (VIBRA -TABS) 100 MG tablet; Take 1 tablet (100 mg total) by mouth 2 (two) times daily for 7 days.  Dispense: 14 tablet; Refill: 0 - Ambulatory referral to Dermatology  8. Abnormal urine color - Routine screening.  - POCT URINALYSIS DIP (CLINITEK); Future - POCT URINALYSIS DIP (CLINITEK)    Patient was given the opportunity to ask questions.  Patient verbalized understanding of the plan and was able to repeat key elements of the plan. Patient was given clear instructions to go to Emergency Department or return to medical center if symptoms don't improve, worsen, or new problems develop.The patient verbalized understanding.   Orders Placed This Encounter  Procedures   CBC   Lipid panel   CMP14+EGFR   TSH    Ambulatory referral to Dermatology   HgB A1c   POCT URINALYSIS DIP (CLINITEK)     Requested Prescriptions   Signed Prescriptions Disp Refills   doxycycline  (VIBRA -TABS) 100 MG tablet 14 tablet 0    Sig: Take 1 tablet (100 mg total) by mouth 2 (two) times daily for 7 days.    Return in about 1 year (around 10/08/2024) for Physical per patient preference.  Senaida Dama, NP

## 2023-10-10 LAB — CMP14+EGFR
ALT: 14 IU/L (ref 0–32)
AST: 16 IU/L (ref 0–40)
Albumin: 4.1 g/dL (ref 4.0–5.0)
Alkaline Phosphatase: 77 IU/L (ref 44–121)
BUN/Creatinine Ratio: 12 (ref 9–23)
BUN: 7 mg/dL (ref 6–20)
Bilirubin Total: 0.3 mg/dL (ref 0.0–1.2)
CO2: 16 mmol/L — ABNORMAL LOW (ref 20–29)
Calcium: 9 mg/dL (ref 8.7–10.2)
Chloride: 102 mmol/L (ref 96–106)
Creatinine, Ser: 0.58 mg/dL (ref 0.57–1.00)
Globulin, Total: 2.9 g/dL (ref 1.5–4.5)
Glucose: 165 mg/dL — ABNORMAL HIGH (ref 70–99)
Potassium: 4.4 mmol/L (ref 3.5–5.2)
Sodium: 139 mmol/L (ref 134–144)
Total Protein: 7 g/dL (ref 6.0–8.5)
eGFR: 125 mL/min/{1.73_m2} (ref >59)

## 2023-10-10 LAB — CBC
Hematocrit: 39.1 % (ref 34.0–46.6)
Hemoglobin: 12.5 g/dL (ref 11.1–15.9)
MCH: 27.8 pg (ref 26.6–33.0)
MCHC: 32 g/dL (ref 31.5–35.7)
MCV: 87 fL (ref 79–97)
Platelets: 518 10*3/uL — ABNORMAL HIGH (ref 150–450)
RBC: 4.49 x10E6/uL (ref 3.77–5.28)
RDW: 13.8 % (ref 11.7–15.4)
WBC: 5.6 10*3/uL (ref 3.4–10.8)

## 2023-10-10 LAB — LIPID PANEL
Chol/HDL Ratio: 3.9 ratio (ref 0.0–4.4)
Cholesterol, Total: 144 mg/dL (ref 100–199)
HDL: 37 mg/dL — ABNORMAL LOW (ref >39)
LDL Chol Calc (NIH): 92 mg/dL (ref 0–99)
Triglycerides: 79 mg/dL (ref 0–149)
VLDL Cholesterol Cal: 15 mg/dL (ref 5–40)

## 2023-10-10 LAB — TSH: TSH: 0.808 u[IU]/mL (ref 0.450–4.500)

## 2023-10-29 ENCOUNTER — Ambulatory Visit (HOSPITAL_COMMUNITY): Admission: EM | Admit: 2023-10-29 | Discharge: 2023-10-29 | Disposition: A | Discharge status: Home / Self Care

## 2023-10-29 ENCOUNTER — Encounter (HOSPITAL_COMMUNITY): Payer: Self-pay | Admitting: *Deleted

## 2023-10-29 ENCOUNTER — Other Ambulatory Visit: Payer: Self-pay

## 2023-10-29 DIAGNOSIS — L259 Unspecified contact dermatitis, unspecified cause: Secondary | ICD-10-CM

## 2023-10-29 MED ORDER — TRIAMCINOLONE ACETONIDE 0.1 % EX CREA: TOPICAL_CREAM | CUTANEOUS | 0 refills | Status: AC

## 2023-10-29 NOTE — Discharge Instructions (Signed)
 Kenalog  cream twice daily for 1 week, continue for a second week if needed but no longer.  Zyrtec  daily and benadryl  nightly for further itch relief

## 2023-10-29 NOTE — ED Provider Notes (Signed)
 [facility]    CSN: [id number] Arrival date & time: 10/29/23  1754      History   Chief Complaint Chief Complaint  Patient presents with   Rash    HPI Mercedes Valdez is a 28 y.o. female.  3 days ago patient developed itching area on the left neck. Developed rash there the next day. Describes as small bumps No interventions yet No known exposure to allergens  Eczema history   Past Medical History:  Diagnosis Date   ADHD (attention deficit hyperactivity disorder)    Asthma    Childhood   Diabetic retinopathy (HCC)    Elevated blood pressure reading without diagnosis of hypertension    Endometrial mass    History of 2019 novel coronavirus disease (COVID-19) 03/09/2020   positive result in epic,  per pt mild to moderate symptoms that resolved   History of asthma    child   History of gonorrhea 2016   Hypertensive retinopathy    Insulin  dependent type 2 diabetes mellitus (HCC)    followd by pcp---- (11-16-2020 per pt checks blood sugar at home 4-5 times daily,  fasting sugar--- 70--95)   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 See comment>>a molar pregnancy is not excluded; correlation with beta-HCG is  recommended.  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    Patient Active Problem List   Diagnosis Date Noted   Candida vaginitis 10/10/2022   Dyslipidemia 05/09/2021   Type 2 diabetes mellitus with both eyes affected by proliferative retinopathy and macular edema, with long-term current use of insulin  (HCC) 05/09/2021   History of herpes simplex infection 10/08/2020   Psychosocial stressors 09/26/2014    Insulin  dependent type 2 diabetes mellitus (HCC) 09/06/2014   Multiple drug allergies 02/27/2014   Musculoskeletal pain 04/08/2013   Gastritis and gastroduodenitis 04/08/2013   Type 2 diabetes mellitus (HCC) 04/08/2013   Irregular menses 04/08/2013   Dysfunctional uterine bleeding 11/09/2012   PCOS (polycystic ovarian syndrome) 09/29/2012   Asthma     Past Surgical History:  Procedure Laterality Date   DILATATION & CURETTAGE/HYSTEROSCOPY WITH MYOSURE N/A 11/21/2020   Procedure: DILATATION & CURETTAGE/HYSTEROSCOPY WITH MYOSURE;  Surgeon: Raynell Caller, MD;  Location: [facility];  Service: Gynecology;  Laterality: N/A;   INJECTION OF SILICONE OIL Right 02/07/2021   Procedure: INJECTION OF SILICONE OIL;  Surgeon: Ronelle Coffee, MD;  Location: [facility] OR;  Service: Ophthalmology;  Laterality: Right;   KNEE ARTHROSCOPY W/ ACL RECONSTRUCTION Left 2011   MEMBRANE PEEL Right 02/07/2021   Procedure: MEMBRANE PEEL;  Surgeon: Ronelle Coffee, MD;  Location: [facility] OR;  Service: Ophthalmology;  Laterality: Right;   PARS PLANA VITRECTOMY Right 02/07/2021   Procedure: TWENTY-FIVE  GAUGE PARS PLANA VITRECTOMY;  Surgeon: Ronelle Coffee, MD;  Location: [facility] OR;  Service: Ophthalmology;  Laterality: Right;   PERFLUORONE INJECTION Right 02/07/2021   Procedure: PERFLUORON INJECTION;  Surgeon: Ronelle Coffee, MD;  Location: [facility] OR;  Service: Ophthalmology;  Laterality: Right;   PHOTOCOAGULATION WITH LASER Right 02/07/2021   Procedure: PHOTOCOAGULATION WITH LASER;  Surgeon: Ronelle Coffee, MD;  Location: [facility] OR;  Service: Ophthalmology;  Laterality: Right;  TYMPANOSTOMY TUBE PLACEMENT Bilateral    child    OB History     Gravida  1   Para      Term      Preterm      AB  1   Living         SAB  0   IAB      Ectopic      Multiple      Live Births           Obstetric Comments  G1: possible  molar pregnancy at 5wks based on path, SAB          Home Medications    Prior to  Admission medications   Medication Sig Start Date End Date Taking? Authorizing Provider  CLINDAMYCIN  HCL PO Take by mouth.   Yes [provider]  Continuous Glucose Sensor (DEXCOM G7 SENSOR) MISC 1 Device by Does not apply route as directed. 07/16/23  Yes Shamleffer, Ibtehal Jaralla, MD  empagliflozin  (JARDIANCE ) 25 MG TABS tablet Take 1 tablet (25 mg total) by mouth daily before breakfast. 07/16/23  Yes Shamleffer, Ibtehal Jaralla, MD  insulin  aspart (NOVOLOG ) 100 UNIT/ML injection Max daily 40 units per pump 07/16/23  Yes Shamleffer, Ibtehal Jaralla, MD  Insulin  Disposable Pump (OMNIPOD 5 G7 INTRO, GEN 5,) KIT 1 Device by Does not apply route every 3 (three) days. 07/16/23  Yes Shamleffer, Julian Obey, MD  Insulin  Disposable Pump (OMNIPOD 5 G7 PODS, GEN 5,) MISC 1 Device by Does not apply route every 3 (three) days. 07/16/23  Yes Shamleffer, Ibtehal Jaralla, MD  Semaglutide  (OZEMPIC , 0.25 OR 0.5 MG/DOSE, Weinert) Inject into the skin.   Yes [provider]  triamcinolone  cream (KENALOG ) 0.1 % Twice daily to area of itch for 1-2 weeks 10/29/23  Yes Kecia Swoboda, PA-C  Accu-Chek Softclix Lancets lancets To check blood sugars 4 times a day. Fasting and 2 hours after breakfast, lunch and dinner. 02/25/21   Senaida Dama, NP  albuterol  (VENTOLIN  HFA) 108 [phone] Base) MCG/ACT inhaler Inhale 2 puffs into the lungs every 6 (six) hours as needed for wheezing or shortness of breath. 06/05/23   Piontek, Cleveland Dales, MD  Blood Pressure Monitoring (BLOOD PRESSURE KIT) DEVI 1 Device by Does not apply route once a week. Please take blood pressure once a week and record in Babyscripts. 10/08/20   Teena Feast, MD  chlorhexidine  (HIBICLENS ) 4 % external liquid Apply topically daily as needed. 11/30/22   Harlow Lighter, [place]  N, FNP  glucose blood (ACCU-CHEK GUIDE) test strip To check blood sugars 4 times a day. Fasting, and 2 hours after Breakfast, Lunch and Dinner 02/25/21   Senaida Dama, NP  ibuprofen  (ADVIL )  800 MG tablet Take 800 mg by mouth 3 (three) times daily. 04/22/21   [provider]  Misc. Devices (GOJJI WEIGHT SCALE) MISC 1 Device by Does not apply route once a week. Please take weight and record in Babyscripts once a week. 10/08/20   Teena Feast, MD  Semaglutide  (RYBELSUS ) 7 MG TABS Take 1 tablet (7 mg total) by mouth daily. 07/16/23   Shamleffer, Julian Obey, MD    Family History Family History  Problem Relation Age of Onset   Diabetes Mother    Vision loss Mother    ADD / ADHD Brother    Allergies Brother    Asthma Brother    Vision loss Brother    Cancer Paternal Grandfather        Colon Cancer  Heart disease Father 79       died from MI at age 74    Social History Social History   Tobacco Use   Smoking status: Every Day    Current packs/day: 0.25    Average packs/day: 0.3 packs/day for 6.0 years (1.5 ttl pk-yrs)    Types: Cigarettes    Passive exposure: Current   Smokeless tobacco: Never   Tobacco comments:    11-16-2020  per pt 3 cig per day  Vaping Use   Vaping status: Never Used  Substance Use Topics   Alcohol use: Yes    Comment: socially   Drug use: Yes    Types: Marijuana     Allergies   Amoxicillin, Augmentin [amoxicillin-pot clavulanate], Penicillins, Shellfish allergy, Suprax  [cefixime ], Trulicity  [dulaglutide ], and Latex   Review of Systems Review of Systems  Skin:  Positive for rash.   As per HPI  Physical Exam Triage Vital Signs ED Triage Vitals  Encounter Vitals Group     BP 10/29/23 1807 (!) 139/91     Systolic BP Percentile --      Diastolic BP Percentile --      Pulse Rate 10/29/23 1807 92     Resp 10/29/23 1807 16     Temp 10/29/23 1807 98.2 F (36.8 C)     Temp Source 10/29/23 1807 Oral     SpO2 10/29/23 1807 99 %     Weight --      Height --      Head Circumference --      Peak Flow --      Pain Score 10/29/23 1809 0     Pain Loc --      Pain Education --      Exclude from Growth Chart --    No  data found.  Updated Vital Signs BP (!) 139/91   Pulse 92   Temp 98.2 F (36.8 C) (Oral)   Resp 16   LMP 10/22/2023 (Exact Date)   SpO2 99%   Physical Exam Vitals and nursing note reviewed.  Constitutional:      General: She is not in acute distress.    Appearance: Normal appearance.  HENT:     Mouth/Throat:     Mouth: Mucous membranes are moist.     Pharynx: Oropharynx is clear. No posterior oropharyngeal erythema.  Eyes:     Conjunctiva/sclera: Conjunctivae normal.     Pupils: Pupils are equal, round, and reactive to light.  Cardiovascular:     Rate and Rhythm: Normal rate and regular rhythm.     Pulses: Normal pulses.     Heart sounds: Normal heart sounds.  Pulmonary:     Effort: Pulmonary effort is normal.     Breath sounds: Normal breath sounds.  Musculoskeletal:        General: Normal range of motion.     Cervical back: Normal range of motion.  Skin:    Findings: Rash present.     Comments: Fine papular rash on the left neck, small area to left upper back  Neurological:     Mental Status: She is alert and oriented to person, place, and time.     UC Treatments / Results  Labs (all labs ordered are listed, but only abnormal results are displayed) Labs Reviewed - No data to display  EKG  Radiology No results found.  Procedures Procedures   Medications Ordered in UC Medications - No data to display  Initial Impression / Assessment and Plan /  UC Course  I have reviewed the triage vital signs and the nursing notes.  Pertinent labs & imaging results that were available during my care of the patient were reviewed by me and considered in my medical decision making (see chart for details).  Small area of rash No red flags, clear lungs, no oral swelling Try kenalog  cream BID Antihistamines Can return if needed No questions   Final Clinical Impressions(s) / UC Diagnoses   Final diagnoses:  Contact dermatitis, unspecified contact dermatitis type,  unspecified trigger     Discharge Instructions      Kenalog  cream twice daily for 1 week, continue for a second week if needed but no longer.  Zyrtec  daily and benadryl  nightly for further itch relief    ED Prescriptions     Medication Sig Dispense Auth. Provider   triamcinolone  cream (KENALOG ) 0.1 % Twice daily to area of itch for 1-2 weeks 45 g Tawonna Esquer, Ivette Marks, PA-C      PDMP not reviewed this encounter.   Newton Barer 10/29/23 2130

## 2023-10-29 NOTE — ED Triage Notes (Signed)
 Pt states she started with pruritus to base of left lateral neck 3 days ago, then the following day started with fine, bumpy pruritic rash to area. Has not been applying any ointments or creams.

## 2023-11-13 ENCOUNTER — Encounter: Payer: Self-pay | Admitting: Internal Medicine

## 2023-11-13 ENCOUNTER — Ambulatory Visit: Payer: 59 | Admitting: Internal Medicine

## 2023-11-13 VITALS — BP 124/80 | HR 86 | Ht 65.0 in | Wt 217.0 lb

## 2023-11-13 DIAGNOSIS — Z794 Long term (current) use of insulin: Secondary | ICD-10-CM

## 2023-11-13 DIAGNOSIS — E113513 Type 2 diabetes mellitus with proliferative diabetic retinopathy with macular edema, bilateral: Secondary | ICD-10-CM | POA: Diagnosis not present

## 2023-11-13 MED ORDER — DEXCOM G7 SENSOR MISC: 1.0000 | 3 refills | Status: AC

## 2023-11-13 MED ORDER — SEMAGLUTIDE (1 MG/DOSE) 4 MG/3ML ~~LOC~~ SOPN: 1.0000 mg | PEN_INJECTOR | SUBCUTANEOUS | 3 refills | Status: AC

## 2023-11-13 MED ORDER — EMPAGLIFLOZIN 25 MG PO TABS: 25.0000 mg | ORAL_TABLET | Freq: Every day | ORAL | 3 refills | Status: AC

## 2023-11-13 NOTE — Patient Instructions (Signed)
 Increase Ozempic  1 mg weekly Continue Jardiance  25 mg daily      HOW TO TREAT LOW BLOOD SUGARS (Blood sugar LESS THAN 70 MG/DL) Please follow the RULE OF 15 for the treatment of hypoglycemia treatment (when your (blood sugars are less than 70 mg/dL)   STEP 1: Take 15 grams of carbohydrates when your blood sugar is low, which includes:  3-4 GLUCOSE TABS  OR 3-4 OZ OF JUICE OR REGULAR SODA OR ONE TUBE OF GLUCOSE GEL    STEP 2: RECHECK blood sugar in 15 MINUTES STEP 3: If your blood sugar is still low at the 15 minute recheck --> then, go back to STEP 1 and treat AGAIN with another 15 grams of carbohydrates.

## 2023-11-13 NOTE — Progress Notes (Unsigned)
 Name: Mercedes Valdez  MRN/ DOB: [id number], 06/02/1995   Age/ Sex: 28 y.o., female    PCP: Lorren Greig PARAS, NP   Reason for Endocrinology Evaluation: Type 2 Diabetes Mellitus     Date of Initial Endocrinology Visit: 05/08/2021    PATIENT IDENTIFIER: Mercedes Valdez is a 28 y.o. female with a past medical history of T2DM, HTN. The patient presented for initial endocrinology clinic visit on 05/08/2021  for consultative assistance with her diabetes management.    HPI: Ms. Frommer was    Diagnosed with DM at age 59 Prior Medications tried/Intolerance: Glipizide , Trulicity  - cost prohibitive               Hemoglobin A1c has ranged from 13.2% in 08/2020, peaking at 14.8% in 02/2021.   She had a miscarriage in 09/2020  On her initial visit A1c 11.9%, adjusted MDI, stopped Glimepiride  and started Jardiance .    Started on OmniPod in August 2023  Started Trulicity  04/2022 but developed localized pruritus Attempted to prescribe Mounjaro  02/2023 but this was switched to Ozempic  due to insurance issues    Patient discontinued OmniPod by 10/2023 due to hypoglycemia  SUBJECTIVE:   During the last visit (07/16/2023): This was a virtual visit   Today (11/13/23): [name] is here for a follow up on diabetes management. She  checks her blood sugars multiple  times daily, through CGM. The patient has not  had hypoglycemic episodes since the last clinic visit.  Has mild nausea but no vomiting  No constipation or diarrhea  No genital infection   She is up-to-date on eye exams, no treatment has been required, per patient her condition has been stable  HOME DIABETES REGIMEN: Jardiance  25 mg daily  Ozempic  0.5 mg weekly    Statin: no ACE-I/ARB: no Prior Diabetic Education:  yes    CONTINUOUS GLUCOSE MONITORING RECORD INTERPRETATION    Dates of Recording: 5/31 - 11/06/2023  Sensor description:dexcom   Results statistics:   CGM use % of time 83  Average and  SD 161/29  Time in range 79 %  % Time Above 180 20  % Time above 250 1  % Time Below target 0      Glycemic patterns summary: BGs are optimal throughout the night and most of the day  Hyperglycemic episodes  postprandial   Hypoglycemic episodes occurred not in the past 2 weeks  Overnight periods: Optimal      DIABETIC COMPLICATIONS: Microvascular complications:  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 Denies: CKD Last eye exam: Completed 09/2023  Macrovascular complications:   Denies: CAD, PVD, CVA   PAST HISTORY: Past Medical History:  Past Medical History:  Diagnosis Date   ADHD (attention deficit hyperactivity disorder)    Asthma    Childhood   Diabetic retinopathy (HCC)    Elevated blood pressure reading without diagnosis of hypertension    Endometrial mass    History of 2019 novel coronavirus disease (COVID-19) 03/09/2020   positive result in epic,  per pt mild to moderate symptoms that resolved   History of asthma    child   History of gonorrhea 2016   Hypertensive retinopathy    Insulin  dependent type 2 diabetes mellitus (HCC)    followd by pcp---- (11-16-2020 per pt checks blood sugar at home 4-5 times daily,  fasting sugar--- 70--95)   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 See comment>>a molar pregnancy is not excluded; correlation with beta-HCG is  recommended.  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   Past Surgical History:  Past Surgical History:  Procedure Laterality Date   DILATATION & CURETTAGE/HYSTEROSCOPY WITH MYOSURE N/A 11/21/2020   Procedure: DILATATION &  CURETTAGE/HYSTEROSCOPY WITH MYOSURE;  Surgeon: Izell Harari, MD;  Location: [facility];  Service: Gynecology;  Laterality: N/A;   INJECTION OF SILICONE OIL Right 02/07/2021   Procedure: INJECTION OF SILICONE OIL;  Surgeon: valdemar Rogue, MD;  Location: [facility] OR;  Service: Ophthalmology;  Laterality: Right;   KNEE ARTHROSCOPY W/ ACL RECONSTRUCTION Left 2011   MEMBRANE PEEL Right 02/07/2021   Procedure: MEMBRANE PEEL;  Surgeon: valdemar Rogue, MD;  Location: [facility] OR;  Service: Ophthalmology;  Laterality: Right;   PARS PLANA VITRECTOMY Right 02/07/2021   Procedure: TWENTY-FIVE  GAUGE PARS PLANA VITRECTOMY;  Surgeon: valdemar Rogue, MD;  Location: [facility] OR;  Service: Ophthalmology;  Laterality: Right;   PERFLUORONE INJECTION Right 02/07/2021   Procedure: PERFLUORON INJECTION;  Surgeon: valdemar Rogue, MD;  Location[facility] OR;  Service: Ophthalmology;  Laterality: Right;   PHOTOCOAGULATION WITH LASER Right 02/07/2021   Procedure: PHOTOCOAGULATION WITH LASER;  Surgeon: valdemar Rogue, MD;  Location: [facility] OR;  Service: Ophthalmology;  Laterality: Right;   TYMPANOSTOMY TUBE PLACEMENT Bilateral    child    Social History:  reports that she has been smoking cigarettes. She has a 1.5 pack-year smoking history. She has been exposed to tobacco smoke. She has never used smokeless tobacco. She reports current alcohol use. She reports current drug use. Drug: Marijuana. Family History:  Family History  Problem Relation Age of Onset   Diabetes Mother    Vision loss Mother    ADD / ADHD Brother    Allergies Brother    Asthma Brother    Vision loss Brother    Cancer Paternal Grandfather        Colon Cancer   Heart disease Father 92       died from MI at age 56     HOME MEDICATIONS: Allergies as of 11/13/2023       Reactions   Amoxicillin Hives   Augmentin [amoxicillin-pot Clavulanate] Hives   Penicillins Hives   Has patient had a PCN reaction causing immediate rash, facial/tongue/throat swelling, SOB  or lightheadedness with hypotension: No Has patient had a PCN reaction causing severe rash involving mucus membranes or skin necrosis: No Has patient had a PCN reaction that required hospitalization No Has patient had a PCN reaction occurring within the last 10 years: No If all of the above answers are NO, then may proceed with Cephalosporin use.   Shellfish Allergy Swelling, Other (See Comments)   Itching lips   Suprax  [cefixime ]    Pt unsure reaction, may have been childhood reaction   Trulicity  [dulaglutide ] Itching   Latex Rash        Medication List        Accurate as of November 13, 2023 10:48 AM. If you have any questions, ask your nurse or doctor.          Accu-Chek Guide test strip Generic drug: glucose blood To check blood sugars 4 times a day.  Fasting, and 2 hours after Breakfast, Lunch and Dinner   Accu-Chek Softclix Lancets lancets To check blood sugars 4 times a day. Fasting and 2 hours after breakfast, lunch and dinner.   albuterol  108 (90 Base) MCG/ACT inhaler Commonly known as: VENTOLIN  HFA Inhale 2 puffs into the lungs every 6 (six) hours as needed for wheezing or shortness of breath.   Blood Pressure Kit Devi 1 Device by Does not apply route once a week. Please take blood pressure once a week and record in Babyscripts.   chlorhexidine  4 % external liquid Commonly known as: Hibiclens  Apply topically daily as needed.   CLINDAMYCIN  HCL PO Take by mouth.   Dexcom G7 Sensor Misc 1 Device by Does not apply route as directed.   empagliflozin  25 MG Tabs tablet Commonly known as: Jardiance  Take 1 tablet (25 mg total) by mouth daily before breakfast.   Gojji Weight Scale Misc 1 Device by Does not apply route once a week. Please take weight and record in Babyscripts once a week.   ibuprofen  800 MG tablet Commonly known as: ADVIL  Take 800 mg by mouth 3 (three) times daily.   insulin  aspart 100 UNIT/ML injection Commonly known as: novoLOG  Max daily 40  units per pump   Omnipod 5 G7 Pods (Gen 5) Misc 1 Device by Does not apply route every 3 (three) days.   Omnipod 5 G7 Intro (Gen 5) Kit 1 Device by Does not apply route every 3 (three) days.   OZEMPIC  (0.25 OR 0.5 MG/DOSE) [place] Inject into the skin.   Rybelsus  7 MG Tabs Generic drug: Semaglutide  Take 1 tablet (7 mg total) by mouth daily.   triamcinolone  cream 0.1 % Commonly known as: KENALOG  Twice daily to area of itch for 1-2 weeks         ALLERGIES: Allergies  Allergen Reactions   Amoxicillin Hives   Augmentin [Amoxicillin-Pot Clavulanate] Hives   Penicillins Hives    Has patient had a PCN reaction causing immediate rash, facial/tongue/throat swelling, SOB or lightheadedness with hypotension: No Has patient had a PCN reaction causing severe rash involving mucus membranes or skin necrosis: No Has patient had a PCN reaction that required hospitalization No Has patient had a PCN reaction occurring within the last 10 years: No If all of the above answers are NO, then may proceed with Cephalosporin use.   Shellfish Allergy Swelling and Other (See Comments)    Itching lips   Suprax  [Cefixime ]     Pt unsure reaction, may have been childhood reaction   Trulicity  [Dulaglutide ] Itching   Latex Rash       OBJECTIVE:   VITAL SIGNS: BP 124/80 (BP Location: Left Arm, Patient Position: Sitting, Cuff Size: Normal)   Pulse 86   Ht 5' 5 (1.651 m)   Wt 217 lb (98.4 kg)   LMP 10/22/2023 (Exact Date)   SpO2 95%   BMI 36.11 kg/m    PHYSICAL EXAM:   General: Pt appears well and is in NAD  Lungs: Clear with good BS bilat   Heart: RRR   Abdomen: soft, nontender  Extremities:   Lower extremities - No pretibial edema.   Neuro: MS is good with appropriate affect, pt is alert and Ox3      DM Foot Exam 03/12/2023 The skin of the feet is intact without sores or ulcerations. The pedal pulses are 2+ on right and 2+ on left. The sensation is intact to a screening 5.07, 10  gram monofilament bilaterally   DATA  REVIEWED:  Lab Results  Component Value Date   HGBA1C 7.6 (A) 10/09/2023   HGBA1C 8.1 (A) 03/12/2023   HGBA1C 8.3 (A) 04/29/2022    Latest Reference Range & Units 11/13/23 12:36  Microalb, Ur mg/dL 0.2  MICROALB/CREAT RATIO <30 mg/g creat 5  Creatinine, Urine 20 - 275 mg/dL 44      Latest Reference Range & Units 10/09/23 10:55  Sodium 134 - 144 mmol/L 139  Potassium 3.5 - 5.2 mmol/L 4.4  Chloride 96 - 106 mmol/L 102  CO2 20 - 29 mmol/L 16 (L)  Glucose 70 - 99 mg/dL 834 (H)  BUN 6 - 20 mg/dL 7  Creatinine 9.42 - 8.99 mg/dL 9.41  Calcium  8.7 - 10.2 mg/dL 9.0  BUN/Creatinine Ratio 9 - 23  12  eGFR >59 mL/min/1.73 125  Alkaline Phosphatase 44 - 121 IU/L 77  Albumin 4.0 - 5.0 g/dL 4.1  AST 0 - 40 IU/L 16  ALT 0 - 32 IU/L 14  Total Protein 6.0 - 8.5 g/dL 7.0  Total Bilirubin 0.0 - 1.2 mg/dL 0.3     Latest Reference Range & Units 10/09/23 10:55  Total CHOL/HDL Ratio 0.0 - 4.4 ratio 3.9  Cholesterol, Total 100 - 199 mg/dL 855  HDL Cholesterol >60 mg/dL 37 (L)  Triglycerides 0 - 149 mg/dL 79  VLDL Cholesterol Cal 5 - 40 mg/dL 15  LDL Chol Calc (NIH) 0 - 99 mg/dL 92  (L): Data is abnormally low     ASSESSMENT / PLAN / RECOMMENDATIONS:   1) Type 2 Diabetes Mellitus. Goal A1c <7.0%.     -Her BG readings have improved dramatically while on Ozempic , time in range went from 69% to 90% -She has not been bolusing, which is fine at this time because her BG's do not trend that high postprandial with Ozempic  use. Her insurance is not covering Ozempic , Rybelsus  is the preferred medication, she will finish the Ozempic  sample and will switch to Rybelsus  -Patient advised to contact us  with any GI side effects -She developed localized pruritus to the injection site of Trulicity  -I have upgraded her to Dexcom G7 and OmniPod pods G7 -I have emphasized the importance of taking Rybelsus  30 minutes before breakfast     Pump   OmniPod Settings    Insulin  type   NovoLog    Basal rate       0000 1.4 u/h               I:C ratio       0000- 1:1                  Sensitivity       0000  25      Goal       0000  110        MEDICATIONS: Continue Jardiance  25 mg daily  Rybelsus  7 mg daily Continue Novolog  per pump     EDUCATION / INSTRUCTIONS: BG monitoring instructions: Patient is instructed to check her blood sugars 3 times a day, before meals . Call [facility] if: BG persistently < 70  I reviewed the Rule of 15 for the treatment of hypoglycemia in detail with the patient. Literature supplied.   2) Diabetic complications:  Eye: Does  have known diabetic retinopathy.  Neuro/ Feet: Does not have known diabetic peripheral neuropathy. Renal: Patient does not have known baseline CKD. She is not on an ACEI/ARB at present.    3) Dyslipidemia :   -TG levels have  normalized but LDL remains above goal -No indication for statin therapy at this time -Will continue to monitor    Follow-up in 4 months   Signed electronically by: Stefano Redgie Butts, MD  [facility]  [facility] [address] [place], [place] [address] Phone: [phone] FAX: [phone]   CC: Lorren Greig PARAS, NP [address] [place] [address] Phone: [phone]  Fax: [phone]    Return to Endocrinology clinic as below: No future appointments.

## 2023-11-14 LAB — MICROALBUMIN / CREATININE URINE RATIO
Creatinine, Urine: 44 mg/dL (ref 20–275)
Microalb Creat Ratio: 5 mg/g{creat} (ref <30)
Microalb, Ur: 0.2 mg/dL

## 2023-11-16 ENCOUNTER — Ambulatory Visit: Payer: Self-pay | Admitting: Internal Medicine

## 2024-01-05 ENCOUNTER — Telehealth: Payer: Self-pay

## 2024-01-05 ENCOUNTER — Other Ambulatory Visit (HOSPITAL_COMMUNITY): Payer: Self-pay

## 2024-01-05 NOTE — Telephone Encounter (Signed)
 Pharmacy Patient Advocate Encounter   Received notification from CoverMyMeds that prior authorization for Dexcom G7 sensor is required/requested.   Insurance verification completed.   The patient is insured through [facility] .   Per test claim: PA required; PA submitted to above mentioned insurance via Latent Key/confirmation #/EOC [id number] Status is pending

## 2024-01-18 NOTE — Telephone Encounter (Signed)
 Pharmacy Patient Advocate Encounter  Received notification from [facility] that Prior Authorization for Dexcom g7 sensor has been APPROVED from 01/05/24 to 01/04/25

## 2024-03-03 ENCOUNTER — Other Ambulatory Visit: Payer: Self-pay

## 2024-03-03 ENCOUNTER — Emergency Department (HOSPITAL_COMMUNITY): Payer: Self-pay

## 2024-03-03 ENCOUNTER — Encounter (HOSPITAL_COMMUNITY): Payer: Self-pay

## 2024-03-03 ENCOUNTER — Emergency Department (HOSPITAL_COMMUNITY)
Admission: EM | Admit: 2024-03-03 | Discharge: 2024-03-03 | Disposition: A | Discharge status: Home / Self Care | Payer: Self-pay

## 2024-03-03 DIAGNOSIS — E119 Type 2 diabetes mellitus without complications: Secondary | ICD-10-CM | POA: Insufficient documentation

## 2024-03-03 DIAGNOSIS — J45909 Unspecified asthma, uncomplicated: Secondary | ICD-10-CM | POA: Insufficient documentation

## 2024-03-03 DIAGNOSIS — Z794 Long term (current) use of insulin: Secondary | ICD-10-CM | POA: Insufficient documentation

## 2024-03-03 DIAGNOSIS — R109 Unspecified abdominal pain: Secondary | ICD-10-CM

## 2024-03-03 DIAGNOSIS — R112 Nausea with vomiting, unspecified: Secondary | ICD-10-CM | POA: Insufficient documentation

## 2024-03-03 DIAGNOSIS — R519 Headache, unspecified: Secondary | ICD-10-CM | POA: Insufficient documentation

## 2024-03-03 DIAGNOSIS — Z91040 Latex allergy status: Secondary | ICD-10-CM | POA: Insufficient documentation

## 2024-03-03 DIAGNOSIS — R1084 Generalized abdominal pain: Secondary | ICD-10-CM | POA: Insufficient documentation

## 2024-03-03 LAB — URINALYSIS, ROUTINE W REFLEX MICROSCOPIC
Bilirubin Urine: NEGATIVE
Glucose, UA: NEGATIVE mg/dL
Hgb urine dipstick: NEGATIVE
Ketones, ur: 5 mg/dL — AB
Leukocytes,Ua: NEGATIVE
Nitrite: NEGATIVE
Protein, ur: NEGATIVE mg/dL
Specific Gravity, Urine: 1.014 (ref 1.005–1.030)
pH: 8 (ref 5.0–8.0)

## 2024-03-03 LAB — CBC WITH DIFFERENTIAL/PLATELET
Abs Immature Granulocytes: 0.01 K/uL (ref 0.00–0.07)
Basophils Absolute: 0 K/uL (ref 0.0–0.1)
Basophils Relative: 0 %
Eosinophils Absolute: 0.2 K/uL (ref 0.0–0.5)
Eosinophils Relative: 3 %
HCT: 43.3 % (ref 36.0–46.0)
Hemoglobin: 13.6 g/dL (ref 12.0–15.0)
Immature Granulocytes: 0 %
Lymphocytes Relative: 43 %
Lymphs Abs: 2.9 K/uL (ref 0.7–4.0)
MCH: 27.6 pg (ref 26.0–34.0)
MCHC: 31.4 g/dL (ref 30.0–36.0)
MCV: 88 fL (ref 80.0–100.0)
Monocytes Absolute: 0.2 K/uL (ref 0.1–1.0)
Monocytes Relative: 3 %
Neutro Abs: 3.5 K/uL (ref 1.7–7.7)
Neutrophils Relative %: 51 %
Platelets: 607 K/uL — ABNORMAL HIGH (ref 150–400)
RBC: 4.92 MIL/uL (ref 3.87–5.11)
RDW: 14.6 % (ref 11.5–15.5)
WBC: 6.9 K/uL (ref 4.0–10.5)
nRBC: 0 % (ref 0.0–0.2)

## 2024-03-03 LAB — COMPREHENSIVE METABOLIC PANEL WITH GFR
ALT: 10 U/L (ref 0–44)
AST: 19 U/L (ref 15–41)
Albumin: 4.6 g/dL (ref 3.5–5.0)
Alkaline Phosphatase: 76 U/L (ref 38–126)
Anion gap: 14 (ref 5–15)
BUN: 10 mg/dL (ref 6–20)
CO2: 21 mmol/L — ABNORMAL LOW (ref 22–32)
Calcium: 10.2 mg/dL (ref 8.9–10.3)
Chloride: 104 mmol/L (ref 98–111)
Creatinine, Ser: 0.75 mg/dL (ref 0.44–1.00)
GFR, Estimated: >60 mL/min (ref >60)
Glucose, Bld: 125 mg/dL — ABNORMAL HIGH (ref 70–99)
Potassium: 4.1 mmol/L (ref 3.5–5.1)
Sodium: 139 mmol/L (ref 135–145)
Total Bilirubin: 0.7 mg/dL (ref 0.0–1.2)
Total Protein: 8.4 g/dL — ABNORMAL HIGH (ref 6.5–8.1)

## 2024-03-03 LAB — HCG, SERUM, QUALITATIVE: Preg, Serum: NEGATIVE

## 2024-03-03 LAB — CBG MONITORING, ED: Glucose-Capillary: 134 mg/dL — ABNORMAL HIGH (ref 70–99)

## 2024-03-03 LAB — LIPASE, BLOOD: Lipase: 26 U/L (ref 11–51)

## 2024-03-03 MED ORDER — MORPHINE SULFATE (PF) 4 MG/ML IV SOLN
4.0000 mg | Freq: Once | INTRAVENOUS | Status: AC
  Administered 2024-03-03: 4 mg via INTRAVENOUS
  Filled 2024-03-03: qty 1

## 2024-03-03 MED ORDER — MAGNESIUM SULFATE IN D5W 1-5 GM/100ML-% IV SOLN
1.0000 g | Freq: Once | INTRAVENOUS | Status: AC
  Administered 2024-03-03: 1 g via INTRAVENOUS
  Filled 2024-03-03: qty 100

## 2024-03-03 MED ORDER — DICYCLOMINE HCL 20 MG PO TABS: 20.0000 mg | ORAL_TABLET | Freq: Two times a day (BID) | ORAL | 0 refills | Status: AC

## 2024-03-03 MED ORDER — ONDANSETRON 4 MG PO TBDP: 4.0000 mg | ORAL_TABLET | Freq: Three times a day (TID) | ORAL | 0 refills | Status: DC | PRN

## 2024-03-03 MED ORDER — IOHEXOL 300 MG/ML  SOLN
100.0000 mL | Freq: Once | INTRAMUSCULAR | Status: AC | PRN
  Administered 2024-03-03: 100 mL via INTRAVENOUS

## 2024-03-03 MED ORDER — METOCLOPRAMIDE HCL 5 MG/ML IJ SOLN
10.0000 mg | Freq: Once | INTRAMUSCULAR | Status: AC
  Administered 2024-03-03: 10 mg via INTRAVENOUS
  Filled 2024-03-03: qty 2

## 2024-03-03 NOTE — Discharge Instructions (Addendum)
 Today you were seen for abdominal pain with nausea, vomiting, and headache.  I suspect you likely have a viral GI illness.  Please pick up your Zofran  take as needed for nausea and vomiting.  You have also been prescribed Bentyl for abdominal cramping.  Please return to the ED if you have uncontrollable vomiting, worsening pain, or fever that does not go down with Tylenol  or Motrin .  Thank you for letting us  treat you today. After reviewing your labs and imaging, I feel you are safe to go home. Please follow up with your PCP in the next several days and provide them with your records from this visit. Return to the Emergency Room if pain becomes severe or symptoms worsen.

## 2024-03-03 NOTE — ED Provider Notes (Signed)
 [place] EMERGENCY DEPARTMENT AT [facility] Provider Note   CSN: [id number] Arrival date & time: 03/03/24  1133     Patient presents with: Abdominal Pain   MICHAELLE BOTTOMLEY is a 28 y.o. female past medical history significant for PCOS, diabetes, and asthma presents today for generalized abdominal pain with nausea and vomiting that began earlier this morning.  Patient also reports headache which she states is her usual headache began after vomiting.  Patient denies shortness of breath, hematemesis, chest pain, blood in stool, fever, chills, cough, congestion, diplopia, tinnitus, numbness, weakness, any other complaints at this time.    Abdominal Pain Associated symptoms: nausea and vomiting        Prior to Admission medications   Medication Sig Start Date End Date Taking? Authorizing Provider  dicyclomine (BENTYL) 20 MG tablet Take 1 tablet (20 mg total) by mouth 2 (two) times daily. 03/03/24  Yes Francis Ileana SAILOR, PA-C  ondansetron  (ZOFRAN -ODT) 4 MG disintegrating tablet Take 1 tablet (4 mg total) by mouth every 8 (eight) hours as needed for nausea or vomiting. 03/03/24  Yes Francis Ileana SAILOR, PA-C  Accu-Chek Softclix Lancets lancets To check blood sugars 4 times a day. Fasting and 2 hours after breakfast, lunch and dinner. 02/25/21   Lorren Greig PARAS, NP  albuterol  (VENTOLIN  HFA) 108 (90 Base) MCG/ACT inhaler Inhale 2 puffs into the lungs every 6 (six) hours as needed for wheezing or shortness of breath. 06/05/23   Piontek, Rocky, MD  Blood Pressure Monitoring (BLOOD PRESSURE KIT) DEVI 1 Device by Does not apply route once a week. Please take blood pressure once a week and record in [employer]. 10/08/20   Lola Donnice CHRISTELLA, MD  chlorhexidine  (HIBICLENS ) 4 % external liquid Apply topically daily as needed. 11/30/22   Dreama, [place]  N, FNP  CLINDAMYCIN  HCL PO Take by mouth.    [provider]  Continuous Glucose Sensor (DEXCOM G7 SENSOR) MISC 1 Device by Does not apply  route as directed. 11/13/23   Shamleffer, Ibtehal Jaralla, MD  empagliflozin  (JARDIANCE ) 25 MG TABS tablet Take 1 tablet (25 mg total) by mouth daily before breakfast. 11/13/23   Shamleffer, Ibtehal Jaralla, MD  glucose blood (ACCU-CHEK GUIDE) test strip To check blood sugars 4 times a day. Fasting, and 2 hours after Breakfast, Lunch and Dinner 02/25/21   Lorren Greig PARAS, NP  ibuprofen  (ADVIL ) 800 MG tablet Take 800 mg by mouth 3 (three) times daily. 04/22/21   [provider]  insulin  aspart (NOVOLOG ) 100 UNIT/ML injection Max daily 40 units per pump 07/16/23   Shamleffer, Donell Cardinal, MD  Misc. Devices (GOJJI WEIGHT SCALE) MISC 1 Device by Does not apply route once a week. Please take weight and record in [employer]s once a week. 10/08/20   Lola Donnice CHRISTELLA, MD  Semaglutide , 1 MG/DOSE, 4 MG/3ML SOPN Inject 1 mg as directed once a week. 11/13/23   Shamleffer, Ibtehal Jaralla, MD  triamcinolone  cream (KENALOG ) 0.1 % Twice daily to area of itch for 1-2 weeks 10/29/23   Rising, Asberry, PA-C    Allergies: Amoxicillin, Augmentin [amoxicillin-pot clavulanate], Penicillins, Shellfish allergy, Suprax  [cefixime ], Trulicity  [dulaglutide ], and Latex    Review of Systems  Gastrointestinal:  Positive for abdominal pain, nausea and vomiting.    Updated Vital Signs BP (!) 145/90 (BP Location: Right Arm)   Pulse 85   Temp (!) 97.5 F (36.4 C) (Oral)   Resp 18   Ht 5' 5 (1.651 m)   Wt 95.3 kg  SpO2 100%   BMI 34.95 kg/m   Physical Exam Vitals and nursing note reviewed.  Constitutional:      General: She is not in acute distress.    Appearance: She is well-developed.  HENT:     Head: Normocephalic and atraumatic.  Eyes:     Conjunctiva/sclera: Conjunctivae normal.  Cardiovascular:     Rate and Rhythm: Normal rate and regular rhythm.     Heart sounds: No murmur heard. Pulmonary:     Effort: Pulmonary effort is normal. No respiratory distress.     Breath sounds: Normal breath sounds.   Abdominal:     Palpations: Abdomen is soft.     Tenderness: There is no abdominal tenderness.  Musculoskeletal:        General: No swelling.     Cervical back: Neck supple.  Skin:    General: Skin is warm and dry.     Capillary Refill: Capillary refill takes less than 2 seconds.  Neurological:     Mental Status: She is alert.  Psychiatric:        Mood and Affect: Mood normal.     (all labs ordered are listed, but only abnormal results are displayed) Labs Reviewed  COMPREHENSIVE METABOLIC PANEL WITH GFR - Abnormal; Notable for the following components:      Result Value   CO2 21 (*)    Glucose, Bld 125 (*)    Total Protein 8.4 (*)    All other components within normal limits  CBC WITH DIFFERENTIAL/PLATELET - Abnormal; Notable for the following components:   Platelets 607 (*)    All other components within normal limits  URINALYSIS, ROUTINE W REFLEX MICROSCOPIC - Abnormal; Notable for the following components:   APPearance HAZY (*)    Ketones, ur 5 (*)    All other components within normal limits  CBG MONITORING, ED - Abnormal; Notable for the following components:   Glucose-Capillary 134 (*)    All other components within normal limits  LIPASE, BLOOD  HCG, SERUM, QUALITATIVE    EKG: None  Radiology: CT ABDOMEN PELVIS W CONTRAST Result Date: 03/03/2024 CLINICAL DATA:  Acute nonlocalized abdominal pain. Generalized abdominal pain, nausea, and vomiting starting this morning. EXAM: CT ABDOMEN AND PELVIS WITH CONTRAST TECHNIQUE: Multidetector CT imaging of the abdomen and pelvis was performed using the standard protocol following bolus administration of intravenous contrast. RADIATION DOSE REDUCTION: This exam was performed according to the departmental dose-optimization program which includes automated exposure control, adjustment of the mA and/or kV according to patient size and/or use of iterative reconstruction technique. CONTRAST:  OMNIPAQUE  IOHEXOL  300 MG/ML  SOLN  COMPARISON:  05/29/2020 FINDINGS: Lower chest: Lung bases are clear. Hepatobiliary: Mild diffuse fatty infiltration of the liver. No focal lesions. Gallbladder and bile ducts are normal. Pancreas: Unremarkable. No pancreatic ductal dilatation or surrounding inflammatory changes. Spleen: Normal in size without focal abnormality. Adrenals/Urinary Tract: Adrenal glands are unremarkable. Kidneys are normal, without renal calculi, focal lesion, or hydronephrosis. Bladder is unremarkable. Stomach/Bowel: Stomach, small bowel, and colon are not abnormally distended. Under distention limits evaluation but there is no evidence of significant wall thickening or inflammatory stranding. Appendix is normal. Vascular/Lymphatic: No significant vascular findings are present. No enlarged abdominal or pelvic lymph nodes. Reproductive: Uterus and bilateral adnexa are unremarkable. Other: No abdominal wall hernia or abnormality. No abdominopelvic ascites. Musculoskeletal: No acute or significant osseous findings. IMPRESSION: 1. No acute process demonstrated in the abdomen or pelvis. No evidence of bowel obstruction or inflammation. 2. Mild  diffuse fatty infiltration of the liver. Electronically Signed   By: Elsie Gravely M.D.   On: 03/03/2024 15:36     Procedures   Medications Ordered in the ED  morphine (PF) 4 MG/ML injection 4 mg (4 mg Intravenous Given 03/03/24 1353)  metoCLOPramide (REGLAN) injection 10 mg (10 mg Intravenous Given 03/03/24 1353)  magnesium sulfate IVPB 1 g 100 mL (0 g Intravenous Stopped 03/03/24 1555)  iohexol  (OMNIPAQUE ) 300 MG/ML solution 100 mL (100 mLs Intravenous Contrast Given 03/03/24 1453)                                    Medical Decision Making Amount and/or Complexity of Data Reviewed Labs: ordered.   This patient presents to the ED for concern of abdominal pain with nausea and vomiting differential diagnosis includes viral GI illness, appendicitis, choledocholithiasis, acute  cholecystitis, pancreatitis, diverticulitis, SBO    Additional history obtained   Additional history obtained from Electronic Medical Record External records from outside source obtained and reviewed including family medicine notes   Lab Tests:  I Ordered, and personally interpreted labs.  The pertinent results include: Mildly reduced CO2 at 21, mildly elevated glucose at 25, mildly elevated total protein at 8.4, evaded platelets at 607 which is not uncommon per historical values, lipase 26, negative pregnancy UA with 5 ketones   Imaging Studies ordered:  I ordered imaging studies including CT abdomen pelvis with contrast I independently visualized and interpreted imaging which showed no acute process demonstrated in the abdomen or pelvis. I agree with the radiologist interpretation   Medicines ordered and prescription drug management:  I ordered medication including Reglan and morphine    I have reviewed the patients home medicines and have made adjustments as needed   Problem List / ED Course:  Patient tolerating p.o. intake without issue prior to discharge.  Upon reassessment patient states that she is feeling much better and her headache has resolved. Considered for admission or further workup however patient's vital signs, physical exam, labs, and imaging are reassuring.  Patient symptoms likely due to a viral GI illness.  Patient given symptomatic management with outpatient Zofran  and Bentyl.  Patient given return precautions.  I feel patient safe for discharge at this time.     Final diagnoses:  Abdominal pain, unspecified abdominal location  Nausea and vomiting, unspecified vomiting type  Nonintractable headache, unspecified chronicity pattern, unspecified headache type    ED Discharge Orders          Ordered    ondansetron  (ZOFRAN -ODT) 4 MG disintegrating tablet  Every 8 hours PRN        03/03/24 1555    dicyclomine (BENTYL) 20 MG tablet  2 times daily         03/03/24 1555               Francis Ileana SAILOR, PA-C 03/03/24 1557    Kammerer, Megan L, DO 03/07/24 [phone]

## 2024-03-03 NOTE — ED Notes (Signed)
 Writer offered pt fluids, but pt advised she had been sipping on her water . Pt drank approximately 8 fluid ounces of water .

## 2024-03-03 NOTE — ED Triage Notes (Signed)
 Generalized abdominal pain with nausea and vomiting that started this AM.

## 2024-03-08 ENCOUNTER — Emergency Department (HOSPITAL_COMMUNITY): Payer: Self-pay

## 2024-03-08 ENCOUNTER — Encounter (HOSPITAL_COMMUNITY): Payer: Self-pay

## 2024-03-08 ENCOUNTER — Other Ambulatory Visit: Payer: Self-pay

## 2024-03-08 ENCOUNTER — Other Ambulatory Visit (HOSPITAL_COMMUNITY): Payer: Self-pay

## 2024-03-08 ENCOUNTER — Emergency Department (HOSPITAL_COMMUNITY)
Admission: EM | Admit: 2024-03-08 | Discharge: 2024-03-08 | Disposition: A | Discharge status: Home / Self Care | Payer: Self-pay | Attending: Emergency Medicine | Admitting: Emergency Medicine

## 2024-03-08 DIAGNOSIS — K2970 Gastritis, unspecified, without bleeding: Secondary | ICD-10-CM | POA: Insufficient documentation

## 2024-03-08 DIAGNOSIS — R110 Nausea: Secondary | ICD-10-CM

## 2024-03-08 DIAGNOSIS — Z8616 Personal history of COVID-19: Secondary | ICD-10-CM | POA: Insufficient documentation

## 2024-03-08 DIAGNOSIS — Z7984 Long term (current) use of oral hypoglycemic drugs: Secondary | ICD-10-CM | POA: Insufficient documentation

## 2024-03-08 DIAGNOSIS — Z91040 Latex allergy status: Secondary | ICD-10-CM | POA: Insufficient documentation

## 2024-03-08 DIAGNOSIS — Z794 Long term (current) use of insulin: Secondary | ICD-10-CM | POA: Insufficient documentation

## 2024-03-08 DIAGNOSIS — F17210 Nicotine dependence, cigarettes, uncomplicated: Secondary | ICD-10-CM | POA: Insufficient documentation

## 2024-03-08 DIAGNOSIS — E119 Type 2 diabetes mellitus without complications: Secondary | ICD-10-CM | POA: Insufficient documentation

## 2024-03-08 DIAGNOSIS — J45909 Unspecified asthma, uncomplicated: Secondary | ICD-10-CM | POA: Insufficient documentation

## 2024-03-08 LAB — COMPREHENSIVE METABOLIC PANEL WITH GFR
ALT: 12 U/L (ref 0–44)
AST: 15 U/L (ref 15–41)
Albumin: 4.6 g/dL (ref 3.5–5.0)
Alkaline Phosphatase: 76 U/L (ref 38–126)
Anion gap: 15 (ref 5–15)
BUN: 8 mg/dL (ref 6–20)
CO2: 21 mmol/L — ABNORMAL LOW (ref 22–32)
Calcium: 9.9 mg/dL (ref 8.9–10.3)
Chloride: 102 mmol/L (ref 98–111)
Creatinine, Ser: 0.61 mg/dL (ref 0.44–1.00)
GFR, Estimated: >60 mL/min (ref >60)
Glucose, Bld: 169 mg/dL — ABNORMAL HIGH (ref 70–99)
Potassium: 3.5 mmol/L (ref 3.5–5.1)
Sodium: 138 mmol/L (ref 135–145)
Total Bilirubin: 0.7 mg/dL (ref 0.0–1.2)
Total Protein: 8.2 g/dL — ABNORMAL HIGH (ref 6.5–8.1)

## 2024-03-08 LAB — CBC WITH DIFFERENTIAL/PLATELET
Abs Immature Granulocytes: 0.03 K/uL (ref 0.00–0.07)
Basophils Absolute: 0 K/uL (ref 0.0–0.1)
Basophils Relative: 1 %
Eosinophils Absolute: 0.1 K/uL (ref 0.0–0.5)
Eosinophils Relative: 1 %
HCT: 42.3 % (ref 36.0–46.0)
Hemoglobin: 13.6 g/dL (ref 12.0–15.0)
Immature Granulocytes: 0 %
Lymphocytes Relative: 34 %
Lymphs Abs: 2.9 K/uL (ref 0.7–4.0)
MCH: 27.5 pg (ref 26.0–34.0)
MCHC: 32.2 g/dL (ref 30.0–36.0)
MCV: 85.5 fL (ref 80.0–100.0)
Monocytes Absolute: 0.3 K/uL (ref 0.1–1.0)
Monocytes Relative: 3 %
Neutro Abs: 5.1 K/uL (ref 1.7–7.7)
Neutrophils Relative %: 61 %
Platelets: 608 K/uL — ABNORMAL HIGH (ref 150–400)
RBC: 4.95 MIL/uL (ref 3.87–5.11)
RDW: 14.7 % (ref 11.5–15.5)
WBC: 8.5 K/uL (ref 4.0–10.5)
nRBC: 0 % (ref 0.0–0.2)

## 2024-03-08 LAB — URINALYSIS, ROUTINE W REFLEX MICROSCOPIC
Bilirubin Urine: NEGATIVE
Glucose, UA: NEGATIVE mg/dL
Hgb urine dipstick: NEGATIVE
Ketones, ur: 20 mg/dL — AB
Leukocytes,Ua: NEGATIVE
Nitrite: NEGATIVE
Protein, ur: NEGATIVE mg/dL
Specific Gravity, Urine: 1.014 (ref 1.005–1.030)
pH: 9 — ABNORMAL HIGH (ref 5.0–8.0)

## 2024-03-08 LAB — CBG MONITORING, ED
Glucose-Capillary: 197 mg/dL — ABNORMAL HIGH (ref 70–99)
Glucose-Capillary: 159 mg/dL — ABNORMAL HIGH (ref 70–99)

## 2024-03-08 LAB — HCG, SERUM, QUALITATIVE: Preg, Serum: NEGATIVE

## 2024-03-08 LAB — LIPASE, BLOOD: Lipase: 24 U/L (ref 11–51)

## 2024-03-08 LAB — TROPONIN T, HIGH SENSITIVITY: Troponin T High Sensitivity: <15 ng/L (ref 0–19)

## 2024-03-08 MED ORDER — SODIUM CHLORIDE 0.9 % IV BOLUS
1000.0000 mL | Freq: Once | INTRAVENOUS | Status: AC
  Administered 2024-03-08: 1000 mL via INTRAVENOUS

## 2024-03-08 MED ORDER — METOCLOPRAMIDE HCL 5 MG/ML IJ SOLN
10.0000 mg | Freq: Once | INTRAMUSCULAR | Status: AC
  Administered 2024-03-08: 10 mg via INTRAVENOUS
  Filled 2024-03-08: qty 2

## 2024-03-08 MED ORDER — OMEPRAZOLE 20 MG PO CPDR
20.0000 mg | DELAYED_RELEASE_CAPSULE | Freq: Every day | ORAL | 0 refills | Status: AC
Start: 2024-03-08 — End: ?
  Filled 2024-03-08 (×2): qty 30, 30d supply, fill #0

## 2024-03-08 MED ORDER — METOCLOPRAMIDE HCL 10 MG PO TABS
10.0000 mg | ORAL_TABLET | Freq: Four times a day (QID) | ORAL | 0 refills | Status: AC
  Filled 2024-03-08 (×2): qty 30, 8d supply, fill #0

## 2024-03-08 MED ORDER — ONDANSETRON HCL 4 MG/2ML IJ SOLN
4.0000 mg | Freq: Once | INTRAMUSCULAR | Status: AC
  Administered 2024-03-08: 4 mg via INTRAVENOUS
  Filled 2024-03-08: qty 2

## 2024-03-08 MED ORDER — PANTOPRAZOLE SODIUM 40 MG IV SOLR
40.0000 mg | Freq: Once | INTRAVENOUS | Status: AC
  Administered 2024-03-08: 40 mg via INTRAVENOUS
  Filled 2024-03-08: qty 10

## 2024-03-08 MED ORDER — SODIUM CHLORIDE 0.9 % IV SOLN
12.5000 mg | Freq: Once | INTRAVENOUS | Status: AC
  Administered 2024-03-08: 12.5 mg via INTRAVENOUS
  Filled 2024-03-08: qty 12.5

## 2024-03-08 NOTE — ED Notes (Signed)
 Patient personal knife is with security.

## 2024-03-08 NOTE — Discharge Instructions (Addendum)
 While you were in the emergency room, you had blood work done that was overall normal.  Your ultrasound of your gallbladder was negative for any problems.  Like we discussed, I think some of your issues are related to something called gastroparesis.  We treat this with a medicine called Reglan.  I have sent this to the [facility] health transition of care pharmacy.  They will be able to help you with cost of medications.  I have also sent a prescription for medicine called omeprazole .  You may take that each day.  I have also included the information for the [facility].  You may follow-up with them for minor health care needs.

## 2024-03-08 NOTE — ED Triage Notes (Signed)
 Pt reports with upper burning abdominal pain, nausea, and vomiting since this morning.

## 2024-03-08 NOTE — ED Provider Notes (Signed)
 [place] [facility] AT [facility] Provider Note  CSN: [id number] Arrival date & time: 03/08/24 1110  Chief Complaint(s) Abdominal Pain  HPI Mercedes Valdez is a 28 y.o. female who is here today for epigastric pain and vomiting.  Patient has a history of gastritis, diabetes.  She was seen here on the ninth for similar symptoms, was unable to fill the prescriptions that were sent for her due to lack of insurance.  She is here today for continuing symptoms.  Patient was CT imaging performed on the ninth.   Past Medical History Past Medical History:  Diagnosis Date   ADHD (attention deficit hyperactivity disorder)    Asthma    Childhood   Diabetic retinopathy (HCC)    Elevated blood pressure reading without diagnosis of hypertension    Endometrial mass    History of 2019 novel coronavirus disease (COVID-19) 03/09/2020   positive result in epic,  per pt mild to moderate symptoms that resolved   History of asthma    child   History of gonorrhea 2016   Hypertensive retinopathy    Insulin  dependent type 2 diabetes mellitus (HCC)    followd by pcp---- (11-16-2020 per pt checks blood sugar at home 4-5 times daily,  fasting sugar--- 70--95)   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 See comment>>a molar pregnancy is not excluded; correlation with beta-HCG is  recommended.  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   Patient Active Problem List   Diagnosis Date Noted   Candida vaginitis 10/10/2022   Dyslipidemia 05/09/2021   Type 2 diabetes mellitus with both eyes affected by proliferative retinopathy and  macular edema, with long-term current use of insulin  (HCC) 05/09/2021   History of herpes simplex infection 10/08/2020   Psychosocial stressors 09/26/2014   Insulin  dependent type 2 diabetes mellitus (HCC) 09/06/2014   Multiple drug allergies 02/27/2014   Musculoskeletal pain 04/08/2013   Gastritis and gastroduodenitis 04/08/2013   Type 2 diabetes mellitus (HCC) 04/08/2013   Irregular menses 04/08/2013   Dysfunctional uterine bleeding 11/09/2012   PCOS (polycystic ovarian syndrome) 09/29/2012   Asthma    Home Medication(s) Prior to Admission medications   Medication Sig Start Date End Date Taking? Authorizing Provider  metoCLOPramide (REGLAN) 10 MG tablet Take 1 tablet (10 mg total) by mouth every 6 (six) hours. 03/08/24  Yes Mannie Pac T, DO  omeprazole  (PRILOSEC) 20 MG capsule Take 1 capsule (20 mg total) by mouth daily. 03/08/24  Yes Mannie Pac T, DO  Accu-Chek Softclix Lancets lancets To check blood sugars 4 times a day. Fasting and 2 hours after breakfast, lunch and dinner. 02/25/21   Lorren Greig PARAS, NP  albuterol  (VENTOLIN  HFA) 108 (90 Base) MCG/ACT inhaler Inhale 2 puffs into the lungs every 6 (six) hours as needed for wheezing or shortness of breath. 06/05/23   Piontek, Rocky, MD  Blood Pressure Monitoring (BLOOD PRESSURE KIT) DEVI 1 Device by Does not apply route once a week. Please take blood pressure once a week and record in Babyscripts. 10/08/20   Lola Donnice CHRISTELLA, MD  chlorhexidine  (HIBICLENS ) 4 % external liquid Apply topically daily as needed. 11/30/22  Garrison, Georgia  N, FNP  CLINDAMYCIN  HCL PO Take by mouth.    [provider]  Continuous Glucose Sensor (DEXCOM G7 SENSOR) MISC 1 Device by Does not apply route as directed. 11/13/23   Shamleffer, Ibtehal Jaralla, MD  dicyclomine (BENTYL) 20 MG tablet Take 1 tablet (20 mg total) by mouth 2 (two) times daily. 03/03/24   Keith, Kayla N, PA-C  empagliflozin  (JARDIANCE ) 25 MG TABS tablet Take 1 tablet (25 mg  total) by mouth daily before breakfast. 11/13/23   Shamleffer, Ibtehal Jaralla, MD  glucose blood (ACCU-CHEK GUIDE) test strip To check blood sugars 4 times a day. Fasting, and 2 hours after Breakfast, Lunch and Dinner 02/25/21   Lorren Greig PARAS, NP  ibuprofen  (ADVIL ) 800 MG tablet Take 800 mg by mouth 3 (three) times daily. 04/22/21   [provider]  insulin  aspart (NOVOLOG ) 100 UNIT/ML injection Max daily 40 units per pump 07/16/23   Shamleffer, Ibtehal Jaralla, MD  Misc. Devices (GOJJI WEIGHT SCALE) MISC 1 Device by Does not apply route once a week. Please take weight and record in Babyscripts once a week. 10/08/20   Lola Donnice HERO, MD  ondansetron  (ZOFRAN -ODT) 4 MG disintegrating tablet Take 1 tablet (4 mg total) by mouth every 8 (eight) hours as needed for nausea or vomiting. 03/03/24   Keith, Kayla N, PA-C  Semaglutide , 1 MG/DOSE, 4 MG/3ML SOPN Inject 1 mg as directed once a week. 11/13/23   Shamleffer, Ibtehal Jaralla, MD  triamcinolone  cream (KENALOG ) 0.1 % Twice daily to area of itch for 1-2 weeks 10/29/23   Rising, Asberry RIGGERS                                                                                                                                    Past Surgical History Past Surgical History:  Procedure Laterality Date   DILATATION & CURETTAGE/HYSTEROSCOPY WITH MYOSURE N/A 11/21/2020   Procedure: DILATATION & CURETTAGE/HYSTEROSCOPY WITH MYOSURE;  Surgeon: Izell Harari, MD;  Location: [facility] [place];  Service: Gynecology;  Laterality: N/A;   INJECTION OF SILICONE OIL Right 02/07/2021   Procedure: INJECTION OF SILICONE OIL;  Surgeon: Valdemar Rogue, MD;  Location: [facility] OR;  Service: Ophthalmology;  Laterality: Right;   KNEE ARTHROSCOPY W/ ACL RECONSTRUCTION Left 2011   MEMBRANE PEEL Right 02/07/2021   Procedure: MEMBRANE PEEL;  Surgeon: Valdemar Rogue, MD;  Location: [facility] OR;  Service: Ophthalmology;  Laterality: Right;   PARS PLANA VITRECTOMY Right 02/07/2021    Procedure: TWENTY-FIVE  GAUGE PARS PLANA VITRECTOMY;  Surgeon: Valdemar Rogue, MD;  Location: [facility] OR;  Service: Ophthalmology;  Laterality: Right;   PERFLUORONE INJECTION Right 02/07/2021   Procedure: PERFLUORON INJECTION;  Surgeon: Valdemar Rogue, MD;  Location: [facility] OR;  Service: Ophthalmology;  Laterality: Right;   PHOTOCOAGULATION WITH LASER Right 02/07/2021   Procedure: PHOTOCOAGULATION WITH LASER;  Surgeon: Valdemar Rogue, MD;  Location: [facility] OR;  Service: Ophthalmology;  Laterality: Right;  TYMPANOSTOMY TUBE PLACEMENT Bilateral    child   Family History Family History  Problem Relation Age of Onset   Diabetes Mother    Vision loss Mother    ADD / ADHD Brother    Allergies Brother    Asthma Brother    Vision loss Brother    Cancer Paternal Grandfather        Colon Cancer   Heart disease Father 83       died from MI at age 69    Social History Social History   Tobacco Use   Smoking status: Every Day    Current packs/day: 0.25    Average packs/day: 0.3 packs/day for 6.0 years (1.5 ttl pk-yrs)    Types: Cigarettes    Passive exposure: Current   Smokeless tobacco: Never   Tobacco comments:    11-16-2020  per pt 3 cig per day  Vaping Use   Vaping status: Never Used  Substance Use Topics   Alcohol use: Yes    Comment: socially   Drug use: Yes    Types: Marijuana   Allergies Amoxicillin, Augmentin [amoxicillin-pot clavulanate], Penicillins, Shellfish allergy, Suprax  [cefixime ], Trulicity  [dulaglutide ], and Latex  Review of Systems Review of Systems  Physical Exam Vital Signs  I have reviewed the triage vital signs BP (!) 166/102 (BP Location: Left Arm)   Pulse (!) 101   Temp 98.7 F (37.1 C) (Oral)   Resp 20   SpO2 100%   Physical Exam Vitals and nursing note reviewed.  Constitutional:      Appearance: She is not toxic-appearing.  HENT:     Head: Normocephalic.  Pulmonary:     Effort: Pulmonary effort is normal.  Abdominal:     General: Abdomen is flat.      Palpations: Abdomen is soft.     Tenderness: There is no abdominal tenderness.  Neurological:     Mental Status: She is alert.     ED Results and Treatments Labs (all labs ordered are listed, but only abnormal results are displayed) Labs Reviewed  COMPREHENSIVE METABOLIC PANEL WITH GFR - Abnormal; Notable for the following components:      Result Value   CO2 21 (*)    Glucose, Bld 169 (*)    Total Protein 8.2 (*)    All other components within normal limits  CBC WITH DIFFERENTIAL/PLATELET - Abnormal; Notable for the following components:   Platelets 608 (*)    All other components within normal limits  URINALYSIS, ROUTINE W REFLEX MICROSCOPIC - Abnormal; Notable for the following components:   APPearance CLOUDY (*)    pH 9.0 (*)    Ketones, ur 20 (*)    All other components within normal limits  CBG MONITORING, ED - Abnormal; Notable for the following components:   Glucose-Capillary 197 (*)    All other components within normal limits  CBG MONITORING, ED - Abnormal; Notable for the following components:   Glucose-Capillary 159 (*)    All other components within normal limits  LIPASE, BLOOD  HCG, SERUM, QUALITATIVE  TROPONIN T, HIGH SENSITIVITY  TROPONIN T, HIGH SENSITIVITY  Radiology US  Abdomen Limited RUQ (LIVER/GB) Result Date: 03/08/2024 CLINICAL DATA:  Right upper quadrant pain. EXAM: ULTRASOUND ABDOMEN LIMITED RIGHT UPPER QUADRANT COMPARISON:  August 27, 2014 FINDINGS: Gallbladder: No gallstones or wall thickening visualized (1.8 mm). No sonographic Murphy sign noted by sonographer. Common bile duct: Diameter: 4.2 mm Liver: No focal lesion identified. Within normal limits in parenchymal echogenicity. Portal vein is patent on color Doppler imaging with normal direction of blood flow towards the liver. Other: The study is technically limited secondary to  limited patient cooperation, as per the ultrasound technologist. IMPRESSION: Unremarkable right upper quadrant ultrasound. Electronically Signed   By: Suzen Dials M.D.   On: 03/08/2024 13:09    Pertinent labs & imaging results that were available during my care of the patient were reviewed by me and considered in my medical decision making (see MDM for details).  Medications Ordered in ED Medications  promethazine (PHENERGAN) 12.5 mg in sodium chloride  0.9 % 50 mL IVPB (has no administration in time range)  metoCLOPramide (REGLAN) injection 10 mg (10 mg Intravenous Given 03/08/24 1242)  pantoprazole  (PROTONIX ) injection 40 mg (40 mg Intravenous Given 03/08/24 1241)  sodium chloride  0.9 % bolus 1,000 mL (1,000 mLs Intravenous New Bag/Given 03/08/24 1251)  ondansetron  (ZOFRAN ) injection 4 mg (4 mg Intravenous Given 03/08/24 1327)                                                                                                                                     Procedures Procedures  (including critical care time)  Medical Decision Making / ED Course   This patient presents to the ED for concern of vomiting, this involves an extensive number of treatment options, and is a complaint that carries with it a high risk of complications and morbidity.  The differential diagnosis includes gastroparesis, gastritis, biliary colic, ACS.  Less likely obstruction.  MDM: Patient's blood work reviewed from prior visit, as well as her CT imaging.  With her history, history of diabetes, with concern for gastroparesis versus gastritis.  Will provide her with some Reglan.  Will obtain right upper quadrant ultrasound on the patient.  Troponin ordered.  Lower suspicion for ACS.  Patient has a barrier to getting her medications as she does not have insurance.  Will plan to send some medications to the [facility] pharmacy.  Reassessment 2:45 PM-patient's gallbladder ultrasound negative.  Her troponin is negative.   Her blood work shows thrombocytopenia which is normal for her.  Urine negative for infection, pregnancy negative.  Patient still having some mild symptoms.  Have given her some Phenergan.  Patient is appropriate for discharge with prescriptions that were sent to the [facility] pharmacy.  Additional history obtained: -Additional history obtained from partner at bedside -External records from outside source obtained and reviewed including: Chart review including previous notes, labs, imaging, consultation notes   Lab Tests: -I ordered, reviewed, and interpreted labs.  The pertinent results include:   Labs Reviewed  COMPREHENSIVE METABOLIC PANEL WITH GFR - Abnormal; Notable for the following components:      Result Value   CO2 21 (*)    Glucose, Bld 169 (*)    Total Protein 8.2 (*)    All other components within normal limits  CBC WITH DIFFERENTIAL/PLATELET - Abnormal; Notable for the following components:   Platelets 608 (*)    All other components within normal limits  URINALYSIS, ROUTINE W REFLEX MICROSCOPIC - Abnormal; Notable for the following components:   APPearance CLOUDY (*)    pH 9.0 (*)    Ketones, ur 20 (*)    All other components within normal limits  CBG MONITORING, ED - Abnormal; Notable for the following components:   Glucose-Capillary 197 (*)    All other components within normal limits  CBG MONITORING, ED - Abnormal; Notable for the following components:   Glucose-Capillary 159 (*)    All other components within normal limits  LIPASE, BLOOD  HCG, SERUM, QUALITATIVE  TROPONIN T, HIGH SENSITIVITY  TROPONIN T, HIGH SENSITIVITY      EKG   EKG Interpretation Date/Time:    Ventricular Rate:    PR Interval:    QRS Duration:    QT Interval:    QTC Calculation:   R Axis:      Text Interpretation:           Imaging Studies ordered: I ordered imaging studies including order quadrant ultrasound I independently visualized and interpreted imaging. I  agree with the radiologist interpretation   Medicines ordered and prescription drug management: Meds ordered this encounter  Medications   metoCLOPramide (REGLAN) injection 10 mg   pantoprazole  (PROTONIX ) injection 40 mg   metoCLOPramide (REGLAN) 10 MG tablet    Sig: Take 1 tablet (10 mg total) by mouth every 6 (six) hours.    Dispense:  30 tablet    Refill:  0   omeprazole  (PRILOSEC) 20 MG capsule    Sig: Take 1 capsule (20 mg total) by mouth daily.    Dispense:  30 capsule    Refill:  0   sodium chloride  0.9 % bolus 1,000 mL   ondansetron  (ZOFRAN ) injection 4 mg   promethazine (PHENERGAN) 12.5 mg in sodium chloride  0.9 % 50 mL IVPB    -I have reviewed the patients home medicines and have made adjustments as needed   Cardiac Monitoring: The patient was maintained on a cardiac monitor.  I personally viewed and interpreted the cardiac monitored which showed an underlying rhythm of: Normal sinus rhythm  Social Determinants of Health:  Factors impacting patients care include: Lack of access to primary care   Reevaluation: After the interventions noted above, I reevaluated the patient and found that they have :improved  Co morbidities that complicate the patient evaluation  Past Medical History:  Diagnosis Date   ADHD (attention deficit hyperactivity disorder)    Asthma    Childhood   Diabetic retinopathy (HCC)    Elevated blood pressure reading without diagnosis of hypertension    Endometrial mass    History of 2019 novel coronavirus disease (COVID-19) 03/09/2020   positive result in epic,  per pt mild to moderate symptoms that resolved   History of asthma    child   History of gonorrhea 2016   Hypertensive retinopathy    Insulin  dependent type 2 diabetes mellitus (HCC)    followd by pcp---- (11-16-2020 per pt checks blood sugar at home 4-5 times daily,  fasting sugar--- 70--95)   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 See comment>>a molar pregnancy is not excluded; correlation with beta-HCG is  recommended.  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

## 2024-03-09 ENCOUNTER — Other Ambulatory Visit (HOSPITAL_COMMUNITY): Payer: Self-pay

## 2024-03-09 ENCOUNTER — Telehealth: Payer: Self-pay | Admitting: Pharmacy Technician

## 2024-03-09 NOTE — Telephone Encounter (Signed)
 Pharmacy Patient Advocate Encounter   Received notification from CoverMyMeds that prior authorization for Ozempic  (0.25 or 0.5 MG/DOSE) 2MG /3ML pen-injectors is due for renewal.   Insurance verification completed.   The patient is insured through [facility] MEDICAID.  Action: Medication has been discontinued. Archived Key: [id number]  **Patient is on a higher dose now.**

## 2024-03-10 ENCOUNTER — Other Ambulatory Visit: Payer: Self-pay

## 2024-03-10 ENCOUNTER — Other Ambulatory Visit (HOSPITAL_COMMUNITY): Payer: Self-pay

## 2024-03-10 ENCOUNTER — Emergency Department (HOSPITAL_COMMUNITY)
Admission: EM | Admit: 2024-03-10 | Discharge: 2024-03-11 | Disposition: A | Discharge status: Home / Self Care | Payer: Self-pay | Attending: Emergency Medicine | Admitting: Emergency Medicine

## 2024-03-10 DIAGNOSIS — Z91040 Latex allergy status: Secondary | ICD-10-CM | POA: Insufficient documentation

## 2024-03-10 DIAGNOSIS — I10 Essential (primary) hypertension: Secondary | ICD-10-CM | POA: Insufficient documentation

## 2024-03-10 DIAGNOSIS — R112 Nausea with vomiting, unspecified: Secondary | ICD-10-CM | POA: Insufficient documentation

## 2024-03-10 DIAGNOSIS — F1290 Cannabis use, unspecified, uncomplicated: Secondary | ICD-10-CM | POA: Insufficient documentation

## 2024-03-10 DIAGNOSIS — R109 Unspecified abdominal pain: Secondary | ICD-10-CM | POA: Insufficient documentation

## 2024-03-10 DIAGNOSIS — E876 Hypokalemia: Secondary | ICD-10-CM | POA: Insufficient documentation

## 2024-03-10 DIAGNOSIS — E119 Type 2 diabetes mellitus without complications: Secondary | ICD-10-CM | POA: Insufficient documentation

## 2024-03-10 DIAGNOSIS — J45909 Unspecified asthma, uncomplicated: Secondary | ICD-10-CM | POA: Insufficient documentation

## 2024-03-10 DIAGNOSIS — Z7984 Long term (current) use of oral hypoglycemic drugs: Secondary | ICD-10-CM | POA: Insufficient documentation

## 2024-03-10 LAB — URINALYSIS, ROUTINE W REFLEX MICROSCOPIC
Bilirubin Urine: NEGATIVE
Glucose, UA: NEGATIVE mg/dL
Hgb urine dipstick: NEGATIVE
Ketones, ur: 5 mg/dL — AB
Leukocytes,Ua: NEGATIVE
Nitrite: NEGATIVE
Protein, ur: NEGATIVE mg/dL
Specific Gravity, Urine: 1.011 (ref 1.005–1.030)
pH: 7 (ref 5.0–8.0)

## 2024-03-10 LAB — COMPREHENSIVE METABOLIC PANEL WITH GFR
ALT: 7 U/L (ref 0–44)
AST: 13 U/L — ABNORMAL LOW (ref 15–41)
Albumin: 4.8 g/dL (ref 3.5–5.0)
Alkaline Phosphatase: 78 U/L (ref 38–126)
Anion gap: 15 (ref 5–15)
BUN: 9 mg/dL (ref 6–20)
CO2: 26 mmol/L (ref 22–32)
Calcium: 9.9 mg/dL (ref 8.9–10.3)
Chloride: 94 mmol/L — ABNORMAL LOW (ref 98–111)
Creatinine, Ser: 0.75 mg/dL (ref 0.44–1.00)
GFR, Estimated: >60 mL/min (ref >60)
Glucose, Bld: 178 mg/dL — ABNORMAL HIGH (ref 70–99)
Potassium: 2.8 mmol/L — ABNORMAL LOW (ref 3.5–5.1)
Sodium: 135 mmol/L (ref 135–145)
Total Bilirubin: 1.2 mg/dL (ref 0.0–1.2)
Total Protein: 8.5 g/dL — ABNORMAL HIGH (ref 6.5–8.1)

## 2024-03-10 LAB — CBC
HCT: 41.9 % (ref 36.0–46.0)
Hemoglobin: 13.6 g/dL (ref 12.0–15.0)
MCH: 27 pg (ref 26.0–34.0)
MCHC: 32.5 g/dL (ref 30.0–36.0)
MCV: 83.3 fL (ref 80.0–100.0)
Platelets: 666 K/uL — ABNORMAL HIGH (ref 150–400)
RBC: 5.03 MIL/uL (ref 3.87–5.11)
RDW: 14.6 % (ref 11.5–15.5)
WBC: 14.2 K/uL — ABNORMAL HIGH (ref 4.0–10.5)
nRBC: 0 % (ref 0.0–0.2)

## 2024-03-10 LAB — HCG, SERUM, QUALITATIVE: Preg, Serum: NEGATIVE

## 2024-03-10 LAB — LIPASE, BLOOD: Lipase: 63 U/L — ABNORMAL HIGH (ref 11–51)

## 2024-03-10 MED ORDER — HALOPERIDOL LACTATE 5 MG/ML IJ SOLN
2.0000 mg | Freq: Once | INTRAMUSCULAR | Status: AC
Start: 2024-03-10 — End: 2024-03-10
  Administered 2024-03-10: 2 mg via INTRAVENOUS
  Filled 2024-03-10: qty 1

## 2024-03-10 MED ORDER — POTASSIUM CHLORIDE CRYS ER 20 MEQ PO TBCR
40.0000 meq | EXTENDED_RELEASE_TABLET | Freq: Once | ORAL | Status: AC
  Administered 2024-03-10: 40 meq via ORAL
  Filled 2024-03-10: qty 2

## 2024-03-10 MED ORDER — ONDANSETRON HCL 4 MG/2ML IJ SOLN: 4.0000 mg | Freq: Once | INTRAMUSCULAR | Status: DC | PRN

## 2024-03-10 MED ORDER — SODIUM CHLORIDE 0.9 % IV BOLUS
1000.0000 mL | Freq: Once | INTRAVENOUS | Status: AC
  Administered 2024-03-10: 1000 mL via INTRAVENOUS

## 2024-03-10 NOTE — ED Triage Notes (Signed)
 Pt ambulatory to triage with complaints of abdominal pain and vomiting that began last Thursday, and has persisted. Pt denies diarrhea.  Pt is taking Ozempic . Pt admits to daily marijuana use, unsure of marijuana hyperemesis syndrome.

## 2024-03-10 NOTE — ED Notes (Signed)
 Difficulty obtaining IV. ED RN Beth at bedside with US 

## 2024-03-10 NOTE — ED Provider Notes (Signed)
 [name] [facility] AT [facility] Provider Note   CSN: [id number] Arrival date & time: 03/10/24  8151     Patient presents with: Emesis and Abdominal Pain   Mercedes Valdez is a 28 y.o. female.   The history is provided by the patient and medical records.  Emesis Associated symptoms: abdominal pain   Abdominal Pain Associated symptoms: nausea and vomiting    28 year old female with history of ADHD, diabetes, PCOS, asthma, hypertension, presenting to the ED with nausea and vomiting.  This has been ongoing since initial ED visit on 03/03/2024.  She had reassuring labs and CT scan during that visit.  Seen here again 2 days ago.  States symptoms seem to have slacked off some but she continues to have daily nausea and vomiting.  She is trying to eat and drink but not able to hold anything down.  She reports a lot of burning in her stomach and a feeling of acid in her throat.  She denies any fever or chills.  No sick contacts.  No diarrhea.  She is on Ozempic , dose has been the same for a while now and she has always tolerated this well.  Does admit to nearly daily marijuana use.  Prior to Admission medications   Medication Sig Start Date End Date Taking? Authorizing Provider  Accu-Chek Softclix Lancets lancets To check blood sugars 4 times a day. Fasting and 2 hours after breakfast, lunch and dinner. 02/25/21   Lorren Greig PARAS, NP  albuterol  (VENTOLIN  HFA) 108 (90 Base) MCG/ACT inhaler Inhale 2 puffs into the lungs every 6 (six) hours as needed for wheezing or shortness of breath. 06/05/23   Piontek, Rocky, MD  Blood Pressure Monitoring (BLOOD PRESSURE KIT) DEVI 1 Device by Does not apply route once a week. Please take blood pressure once a week and record in Babyscripts. 10/08/20   Lola Donnice CHRISTELLA, MD  chlorhexidine  (HIBICLENS ) 4 % external liquid Apply topically daily as needed. 11/30/22   Dreama, Georgia  N, FNP  CLINDAMYCIN  HCL PO Take by mouth.    [provider]  Continuous Glucose Sensor (DEXCOM G7 SENSOR) MISC 1 Device by Does not apply route as directed. 11/13/23   Shamleffer, Ibtehal Jaralla, MD  dicyclomine (BENTYL) 20 MG tablet Take 1 tablet (20 mg total) by mouth 2 (two) times daily. 03/03/24   Keith, Kayla N, PA-C  empagliflozin  (JARDIANCE ) 25 MG TABS tablet Take 1 tablet (25 mg total) by mouth daily before breakfast. 11/13/23   Shamleffer, Ibtehal Jaralla, MD  glucose blood (ACCU-CHEK GUIDE) test strip To check blood sugars 4 times a day. Fasting, and 2 hours after Breakfast, Lunch and Dinner 02/25/21   Lorren Greig PARAS, NP  ibuprofen  (ADVIL ) 800 MG tablet Take 800 mg by mouth 3 (three) times daily. 04/22/21   [provider]  insulin  aspart (NOVOLOG ) 100 UNIT/ML injection Max daily 40 units per pump 07/16/23   Shamleffer, Ibtehal Jaralla, MD  metoCLOPramide (REGLAN) 10 MG tablet Take 1 tablet (10 mg total) by mouth every 6 (six) hours. 03/08/24   Mannie Fairy DASEN, DO  Misc. Devices (GOJJI WEIGHT SCALE) MISC 1 Device by Does not apply route once a week. Please take weight and record in Babyscripts once a week. 10/08/20   Lola Donnice CHRISTELLA, MD  omeprazole  (PRILOSEC) 20 MG capsule Take 1 capsule (20 mg total) by mouth daily. 03/08/24   Mannie Fairy T, DO  ondansetron  (ZOFRAN -ODT) 4 MG disintegrating tablet Take 1 tablet (4 mg total)  by mouth every 8 (eight) hours as needed for nausea or vomiting. 03/03/24   Keith, Kayla N, PA-C  Semaglutide , 1 MG/DOSE, 4 MG/3ML SOPN Inject 1 mg as directed once a week. 11/13/23   Shamleffer, Ibtehal Jaralla, MD  triamcinolone  cream (KENALOG ) 0.1 % Twice daily to area of itch for 1-2 weeks 10/29/23   Rising, Asberry, PA-C    Allergies: Amoxicillin, Augmentin [amoxicillin-pot clavulanate], Penicillins, Shellfish allergy, Suprax  [cefixime ], Trulicity  [dulaglutide ], and Latex    Review of Systems  Gastrointestinal:  Positive for abdominal pain, nausea and vomiting.  All other systems reviewed and are  negative.   Updated Vital Signs BP (!) 148/87   Pulse 99   Temp 99 F (37.2 C) (Oral)   Resp (!) 21   LMP 02/19/2024 (Exact Date)   SpO2 100%   Physical Exam Vitals and nursing note reviewed.  Constitutional:      Appearance: She is well-developed.  HENT:     Head: Normocephalic and atraumatic.  Eyes:     Conjunctiva/sclera: Conjunctivae normal.     Pupils: Pupils are equal, round, and reactive to light.  Cardiovascular:     Rate and Rhythm: Normal rate and regular rhythm.     Heart sounds: Normal heart sounds.  Pulmonary:     Effort: Pulmonary effort is normal.     Breath sounds: Normal breath sounds.  Abdominal:     General: Bowel sounds are normal.     Palpations: Abdomen is soft.     Tenderness: There is no abdominal tenderness. There is no guarding or rebound.  Musculoskeletal:        General: Normal range of motion.     Cervical back: Normal range of motion.  Skin:    General: Skin is warm and dry.  Neurological:     Mental Status: She is alert and oriented to person, place, and time.     (all labs ordered are listed, but only abnormal results are displayed) Labs Reviewed  LIPASE, BLOOD - Abnormal; Notable for the following components:      Result Value   Lipase 63 (*)    All other components within normal limits  COMPREHENSIVE METABOLIC PANEL WITH GFR - Abnormal; Notable for the following components:   Potassium 2.8 (*)    Chloride 94 (*)    Glucose, Bld 178 (*)    Total Protein 8.5 (*)    AST 13 (*)    All other components within normal limits  CBC - Abnormal; Notable for the following components:   WBC 14.2 (*)    Platelets 666 (*)    All other components within normal limits  URINALYSIS, ROUTINE W REFLEX MICROSCOPIC  HCG, SERUM, QUALITATIVE    EKG: None  Radiology: No results found.   Procedures   Medications Ordered in the ED  ondansetron  (ZOFRAN ) injection 4 mg (has no administration in time range)  sodium chloride  0.9 % bolus 1,000  mL (1,000 mLs Intravenous New Bag/Given 03/10/24 2241)  haloperidol lactate (HALDOL) injection 2 mg (2 mg Intravenous Given 03/10/24 2240)                                    Medical Decision Making Amount and/or Complexity of Data Reviewed Labs: ordered. ECG/medicine tests: ordered and independent interpretation performed.  Risk Prescription drug management.   28 year old female presenting to the ED with nausea and vomiting.  This is her third visit since  symptom onset on 03/03/2024.  She has had reassuring labs and CT scan as well as ultrasound thus far.  She is afebrile and nontoxic in appearance here.  Her abdomen is soft and nontender.  She does not have any peritoneal signs.  Does endorse feeling of acid I suspect is likely from repeated vomiting.  Does admit to smoking a lot of marijuana, this is likely contributing to her symptoms.  Plan for recheck of labs, symptomatic control.  Will reassess.  12:32 AM Labs today with minor hypokalemia at 2.8, suspect from GI losses.  Given oral replacement and tolerated well.  She has not had any active emesis here in the ED and is tolerating PO now.  Appears stable for discharge.  Will refer to GI if ongoing issues.  Strongly encouraged marijuana cessation.  Can return here for new concerns.  Final diagnoses:  Nausea and vomiting, unspecified vomiting type  Marijuana use, continuous    ED Discharge Orders          Ordered    ondansetron  (ZOFRAN -ODT) 4 MG disintegrating tablet  Every 8 hours PRN        03/11/24 0034    promethazine (PHENERGAN) 25 MG suppository  Every 6 hours PRN        03/11/24 0034               Jarold Olam HERO, PA-C 03/11/24 9964    Lenor Hollering, MD 03/11/24 2318

## 2024-03-11 MED ORDER — PROMETHAZINE HCL 25 MG RE SUPP: 25.0000 mg | Freq: Four times a day (QID) | RECTAL | 0 refills | Status: DC | PRN

## 2024-03-11 MED ORDER — ONDANSETRON 4 MG PO TBDP: 4.0000 mg | ORAL_TABLET | Freq: Three times a day (TID) | ORAL | 0 refills | Status: AC | PRN

## 2024-03-11 NOTE — Discharge Instructions (Signed)
 Your labs today were overall reassuring aside from your low potassium which we addressed/replaced. Strongly recommend to refrain from smoking marijuana, this can certainly contribute to your symptoms. Can follow-up with your primary care doctor. May benefit from GI evaluation as well if symptoms persist-- can call to schedule appt. Return here for new concerns.

## 2024-03-12 ENCOUNTER — Other Ambulatory Visit: Payer: Self-pay

## 2024-03-12 ENCOUNTER — Encounter (HOSPITAL_COMMUNITY): Payer: Self-pay | Admitting: *Deleted

## 2024-03-12 ENCOUNTER — Emergency Department (HOSPITAL_COMMUNITY)
Admission: EM | Admit: 2024-03-12 | Discharge: 2024-03-12 | Disposition: A | Discharge status: Home / Self Care | Payer: Self-pay | Attending: Emergency Medicine | Admitting: Emergency Medicine

## 2024-03-12 DIAGNOSIS — R112 Nausea with vomiting, unspecified: Secondary | ICD-10-CM | POA: Insufficient documentation

## 2024-03-12 DIAGNOSIS — E119 Type 2 diabetes mellitus without complications: Secondary | ICD-10-CM | POA: Insufficient documentation

## 2024-03-12 DIAGNOSIS — Z794 Long term (current) use of insulin: Secondary | ICD-10-CM | POA: Insufficient documentation

## 2024-03-12 DIAGNOSIS — Z91040 Latex allergy status: Secondary | ICD-10-CM | POA: Insufficient documentation

## 2024-03-12 DIAGNOSIS — E871 Hypo-osmolality and hyponatremia: Secondary | ICD-10-CM | POA: Insufficient documentation

## 2024-03-12 DIAGNOSIS — Z7984 Long term (current) use of oral hypoglycemic drugs: Secondary | ICD-10-CM | POA: Insufficient documentation

## 2024-03-12 LAB — URINALYSIS, ROUTINE W REFLEX MICROSCOPIC
Bilirubin Urine: NEGATIVE
Glucose, UA: NEGATIVE mg/dL
Hgb urine dipstick: NEGATIVE
Ketones, ur: NEGATIVE mg/dL
Leukocytes,Ua: NEGATIVE
Nitrite: NEGATIVE
Protein, ur: NEGATIVE mg/dL
Specific Gravity, Urine: 1.01 (ref 1.005–1.030)
pH: 6 (ref 5.0–8.0)

## 2024-03-12 LAB — CBC
HCT: 40.6 % (ref 36.0–46.0)
Hemoglobin: 13.3 g/dL (ref 12.0–15.0)
MCH: 27.8 pg (ref 26.0–34.0)
MCHC: 32.8 g/dL (ref 30.0–36.0)
MCV: 84.8 fL (ref 80.0–100.0)
Platelets: 517 K/uL — ABNORMAL HIGH (ref 150–400)
RBC: 4.79 MIL/uL (ref 3.87–5.11)
RDW: 14.6 % (ref 11.5–15.5)
WBC: 9.9 K/uL (ref 4.0–10.5)
nRBC: 0 % (ref 0.0–0.2)

## 2024-03-12 LAB — HEPATIC FUNCTION PANEL
ALT: 18 U/L (ref 0–44)
AST: 38 U/L (ref 15–41)
Albumin: 4 g/dL (ref 3.5–5.0)
Alkaline Phosphatase: 60 U/L (ref 38–126)
Bilirubin, Direct: 0.4 mg/dL — ABNORMAL HIGH (ref 0.0–0.2)
Indirect Bilirubin: 1.6 mg/dL — ABNORMAL HIGH (ref 0.3–0.9)
Total Bilirubin: 2 mg/dL — ABNORMAL HIGH (ref 0.0–1.2)
Total Protein: 7.9 g/dL (ref 6.5–8.1)

## 2024-03-12 LAB — LIPASE, BLOOD: Lipase: 25 U/L (ref 11–51)

## 2024-03-12 LAB — COMPREHENSIVE METABOLIC PANEL WITH GFR
ALT: 19 U/L (ref 0–44)
AST: 30 U/L (ref 15–41)
Albumin: 4 g/dL (ref 3.5–5.0)
Alkaline Phosphatase: 60 U/L (ref 38–126)
Anion gap: 15 (ref 5–15)
BUN: 8 mg/dL (ref 6–20)
CO2: 22 mmol/L (ref 22–32)
Calcium: 9 mg/dL (ref 8.9–10.3)
Chloride: 96 mmol/L — ABNORMAL LOW (ref 98–111)
Creatinine, Ser: 0.79 mg/dL (ref 0.44–1.00)
GFR, Estimated: >60 mL/min (ref >60)
Glucose, Bld: 182 mg/dL — ABNORMAL HIGH (ref 70–99)
Potassium: 3.8 mmol/L (ref 3.5–5.1)
Sodium: 133 mmol/L — ABNORMAL LOW (ref 135–145)
Total Bilirubin: 2.2 mg/dL — ABNORMAL HIGH (ref 0.0–1.2)
Total Protein: 7.4 g/dL (ref 6.5–8.1)

## 2024-03-12 LAB — HCG, SERUM, QUALITATIVE: Preg, Serum: NEGATIVE

## 2024-03-12 MED ORDER — PROMETHAZINE HCL 25 MG RE SUPP
25.0000 mg | Freq: Four times a day (QID) | RECTAL | 0 refills | Status: AC | PRN
  Filled 2024-03-12: qty 12, 3d supply, fill #0

## 2024-03-12 MED ORDER — LACTATED RINGERS IV BOLUS
1000.0000 mL | Freq: Once | INTRAVENOUS | Status: AC
  Administered 2024-03-12: 1000 mL via INTRAVENOUS

## 2024-03-12 MED ORDER — DROPERIDOL 2.5 MG/ML IJ SOLN
0.6250 mg | Freq: Once | INTRAMUSCULAR | Status: AC
  Administered 2024-03-12: 0.625 mg via INTRAVENOUS
  Filled 2024-03-12: qty 2

## 2024-03-12 MED ORDER — ALUM & MAG HYDROXIDE-SIMETH 200-200-20 MG/5ML PO SUSP
30.0000 mL | Freq: Once | ORAL | Status: AC
  Administered 2024-03-12: 30 mL via ORAL
  Filled 2024-03-12: qty 30

## 2024-03-12 NOTE — ED Notes (Signed)
 Pt was able to tolerate half a sandwich and apple sauce.

## 2024-03-12 NOTE — Discharge Instructions (Addendum)
 You were seen in the ER today for evaluation of your symptoms. I am glad that you are feeling better. Please make sure that you are adhering to a bland diet. Make sure that you are staying well hydrated drinking plenty of fluids, mainly water . Stop using marijuana as this may improve your symptoms. Pick up the phenergan suppositories as you can use these instead of oral medications if you are still having nausea. I have included the information for a GI specialist for you to follow up with. If you develop any fever, worsening pain, throwing up anything black or bloody, chest pain, shortness of breath, return to the ER for evaluation I included some additional information for you to review.  If you have any concerns, new or worsening symptoms, please return your nearest emergency department for evaluation.

## 2024-03-12 NOTE — ED Triage Notes (Addendum)
 Pt having ongoing abd pain and NV since 10/9. Prescribed different nausea meds at home without relief. Has been seen in the ER and reports the only meds that have helped are iv meds. Denies marijuana use and has had no increased in dosage for ozempic 

## 2024-03-12 NOTE — ED Provider Notes (Signed)
 [place] [facility] DEPART[facility] AT [facility] Provider Note   CSN: [id number] Arrival date & time: 03/12/24  0434     Patient presents with: Abdominal Pain   Mercedes Valdez is a 28 y.o. female history of PCOS, type 2 diabetes, presents to the emergency department today for evaluation of continued nausea and vomiting.  She reports that she will have some epigastric abdominal pain before however does not have any since.  She reports that she has been unable to tolerate any foods or fluids.  Still passing gas.  She denies any black or blood in the stool or emesis.  She has been seen in the ER multiple times in the past 2 weeks for this.  She only recently picked up the Reglan and omeprazole  but was not able to tolerate this.  She does smoke marijuana daily however has stopped within the past few days because of her symptoms.  No recent increase in her dosage for Ozempic  since July.  Did give herself another shot on Monday.  Denies any chest pain, shortness of breath, fevers.  Denies any urinary or vaginal symptoms.   Abdominal Pain Associated symptoms: nausea and vomiting   Associated symptoms: no chest pain, no chills, no constipation, no diarrhea, no dysuria, no fever, no hematuria, no shortness of breath, no vaginal bleeding and no vaginal discharge        Prior to Admission medications   Medication Sig Start Date End Date Taking? Authorizing Provider  Accu-Chek Softclix Lancets lancets To check blood sugars 4 times a day. Fasting and 2 hours after breakfast, lunch and dinner. 02/25/21   Lorren Greig PARAS, NP  albuterol  (VENTOLIN  HFA) 108 (90 Base) MCG/ACT inhaler Inhale 2 puffs into the lungs every 6 (six) hours as needed for wheezing or shortness of breath. 06/05/23   Piontek, Rocky, MD  Blood Pressure Monitoring (BLOOD PRESSURE KIT) DEVI 1 Device by Does not apply route once a week. Please take blood pressure once a week and record in Babyscripts. 10/08/20   Lola Donnice CHRISTELLA,  MD  chlorhexidine  (HIBICLENS ) 4 % external liquid Apply topically daily as needed. 11/30/22   Dreama, Georgia  N, FNP  CLINDAMYCIN  HCL PO Take by mouth.    [provider]  Continuous Glucose Sensor (DEXCOM G7 SENSOR) MISC 1 Device by Does not apply route as directed. 11/13/23   Shamleffer, Ibtehal Jaralla, MD  dicyclomine (BENTYL) 20 MG tablet Take 1 tablet (20 mg total) by mouth 2 (two) times daily. 03/03/24   Keith, Kayla N, PA-C  empagliflozin  (JARDIANCE ) 25 MG TABS tablet Take 1 tablet (25 mg total) by mouth daily before breakfast. 11/13/23   Shamleffer, Ibtehal Jaralla, MD  glucose blood (ACCU-CHEK GUIDE) test strip To check blood sugars 4 times a day. Fasting, and 2 hours after Breakfast, Lunch and Dinner 02/25/21   Lorren Greig PARAS, NP  ibuprofen  (ADVIL ) 800 MG tablet Take 800 mg by mouth 3 (three) times daily. 04/22/21   [provider]  insulin  aspart (NOVOLOG ) 100 UNIT/ML injection Max daily 40 units per pump 07/16/23   Shamleffer, Ibtehal Jaralla, MD  metoCLOPramide (REGLAN) 10 MG tablet Take 1 tablet (10 mg total) by mouth every 6 (six) hours. 03/08/24   Mannie Fairy DASEN, DO  Misc. Devices (GOJJI WEIGHT SCALE) MISC 1 Device by Does not apply route once a week. Please take weight and record in Babyscripts once a week. 10/08/20   Lola Donnice CHRISTELLA, MD  omeprazole  (PRILOSEC) 20 MG capsule Take 1 capsule (  20 mg total) by mouth daily. 03/08/24   Mannie Pac T, DO  ondansetron  (ZOFRAN -ODT) 4 MG disintegrating tablet Take 1 tablet (4 mg total) by mouth every 8 (eight) hours as needed. 03/11/24   Jarold Olam HERO, PA-C  promethazine (PHENERGAN) 25 MG suppository Place 1 suppository (25 mg total) rectally every 6 (six) hours as needed for nausea or vomiting. 03/11/24   Jarold Olam HERO, PA-C  Semaglutide , 1 MG/DOSE, 4 MG/3ML SOPN Inject 1 mg as directed once a week. 11/13/23   Shamleffer, Ibtehal Jaralla, MD  triamcinolone  cream (KENALOG ) 0.1 % Twice daily to area of itch for 1-2  weeks 10/29/23   Rising, Asberry, PA-C    Allergies: Amoxicillin, Augmentin [amoxicillin-pot clavulanate], Penicillins, Shellfish allergy, Suprax  [cefixime ], Trulicity  [dulaglutide ], and Latex    Review of Systems  Constitutional:  Negative for chills and fever.  Respiratory:  Negative for shortness of breath.   Cardiovascular:  Negative for chest pain.  Gastrointestinal:  Positive for abdominal pain, nausea and vomiting. Negative for blood in stool, constipation and diarrhea.  Genitourinary:  Negative for dysuria, hematuria, vaginal bleeding, vaginal discharge and vaginal pain.    Updated Vital Signs BP 117/68   Pulse 88   Temp 98.3 F (36.8 C) (Oral)   Resp 17   LMP 02/19/2024 (Exact Date)   SpO2 100%   Physical Exam Vitals and nursing note reviewed.  Constitutional:      General: She is not in acute distress.    Appearance: She is not ill-appearing or toxic-appearing.     Comments: Comfortably on stretcher no acute distress  HENT:     Mouth/Throat:     Mouth: Mucous membranes are moist.  Cardiovascular:     Rate and Rhythm: Normal rate.  Pulmonary:     Effort: Pulmonary effort is normal. No respiratory distress.  Abdominal:     General: Bowel sounds are normal. There is no distension.     Palpations: Abdomen is soft.     Tenderness: There is no abdominal tenderness.  Skin:    General: Skin is warm and dry.  Neurological:     Mental Status: She is alert.     (all labs ordered are listed, but only abnormal results are displayed) Labs Reviewed  COMPREHENSIVE METABOLIC PANEL WITH GFR - Abnormal; Notable for the following components:      Result Value   Sodium 133 (*)    Chloride 96 (*)    Glucose, Bld 182 (*)    Total Bilirubin 2.2 (*)    All other components within normal limits  CBC - Abnormal; Notable for the following components:   Platelets 517 (*)    All other components within normal limits  URINALYSIS, ROUTINE W REFLEX MICROSCOPIC - Abnormal; Notable for  the following components:   APPearance HAZY (*)    All other components within normal limits  HEPATIC FUNCTION PANEL - Abnormal; Notable for the following components:   Total Bilirubin 2.0 (*)    Bilirubin, Direct 0.4 (*)    Indirect Bilirubin 1.6 (*)    All other components within normal limits  LIPASE, BLOOD  HCG, SERUM, QUALITATIVE  HEPATIC FUNCTION PANEL    EKG: EKG Interpretation Date/Time:  Saturday March 12 2024 09:38:21 EDT Ventricular Rate:  93 PR Interval:  113 QRS Duration:  93 QT Interval:  385 QTC Calculation: 479 R Axis:   35  Text Interpretation: Sinus rhythm Borderline short PR interval Borderline prolonged QT interval No significant change since last tracing Confirmed by  Dreama Longs ([id number]) on 03/12/2024 11:41:48 AM  Radiology: No results found.  Procedures   Medications Ordered in the ED  lactated ringers  bolus 1,000 mL (1,000 mLs Intravenous New Bag/Given 03/12/24 1127)  droperidol (INAPSINE) 2.5 MG/ML injection 0.625 mg (0.625 mg Intravenous Given 03/12/24 1237)   Medical Decision Making Amount and/or Complexity of Data Reviewed Labs: ordered.  Risk OTC drugs. Prescription drug management.   28 y.o. female presents to the ER for evaluation of nausea and vomiting. Differential diagnosis includes but is not limited to ACS/MI, Boerhaave's, DKA, elevated ICP, Ischemic bowel, Sepsis, drug-related, Appendicitis, Bowel obstruction, Electrolyte abnormalities, Pancreatitis, Biliary colic, Gastroenteritis, Gastroparesis, Hepatitis, Migraine, Thyroid disease, Renal colic, PUD, UTI. Vital signs unremarkable. Physical exam as noted above.   On previous chart evaluation, patient is been seen on 10-9, 10-14, 10-16.  She has had CT scans,  R upper quadrant ultrasounds, and lab work.  Without acute findings.  Reports that she has been given Haldol, Reglan, pantoprazole , Zofran , fluids, Phenergan.  She is continuation of symptoms.  Will evaluate lab results  before seeing if imaging is needed.  I independently reviewed and interpreted the patient's labs.  CBC without leukocytosis or anemia.  Does have slightly elevated platelet count of 517.  This is actually slightly improved from her previous labs.  CMP shows mildly decreased sodium 133 however likely pseudohyponatremia given glucose at 182.  Chloride at 96.  She has a mildly increased total bili of 2.2 without any other electrolyte or LFT abnormalities.  Anion gap of 15.  Lipase within normal limits.  Urinalysis shows hazy urine otherwise unremarkable.  hCG is negative.  Patient's QTc is 479.  Discussed with attending, still okay to give droperidol.  Will give her one-time dose here as well as some fluids.  Reassess abdominal exam, still nontender to palpation.  Patient reports she is feeling significantly better and would like to go home.  She has tolerated p.o. here without any difficulty.  She is hemodynamically stable.  Do not feel any emergent reimaging is needed at this time given the abdomen is under to palpation as well as patient denies any other nausea, vomiting, or epigastric pain.  Symptoms could be related to her near daily marijuana use.  Will give patient strict return precautions.  I see that she was given Phenergan suppositories however she did not pick this up.  Was encouraged to pick these up as this will help her if she continues to vomit.  Do feel she is stable for discharge home with strict return precautions.  We discussed the results of the labs/imaging. The plan is bland diet, oral rehydration, take medications, strict return precautions. We discussed strict return precautions and red flag symptoms. The patient verbalized their understanding and agrees to the plan. The patient is stable and being discharged home in good condition.  \Portions of this report may have been transcribed using voice recognition software. Every effort was made to ensure accuracy; however, inadvertent  computerized transcription errors may be present.    Final diagnoses:  Nausea and vomiting, unspecified vomiting type    ED Discharge Orders          Ordered    promethazine (PHENERGAN) 25 MG suppository  Every 6 hours PRN        03/12/24 1710               Bernis Ernst, PA-C 03/18/24 1045    Dreama Longs, MD 03/21/24 [phone]

## 2024-03-12 NOTE — ED Notes (Addendum)
 Pt. Reported that she has been drinking water  from her water  bottle. She refused food. This NT left a sandwich bag next to her.

## 2024-03-14 ENCOUNTER — Other Ambulatory Visit: Payer: Self-pay

## 2024-03-18 ENCOUNTER — Ambulatory Visit: Admitting: Internal Medicine

## 2024-05-08 ENCOUNTER — Ambulatory Visit (HOSPITAL_COMMUNITY)
Admission: EM | Admit: 2024-05-08 | Discharge: 2024-05-08 | Disposition: A | Discharge status: Home / Self Care | Payer: Self-pay | Source: Home / Self Care

## 2024-05-08 ENCOUNTER — Encounter (HOSPITAL_COMMUNITY): Payer: Self-pay

## 2024-05-08 DIAGNOSIS — R059 Cough, unspecified: Secondary | ICD-10-CM | POA: Insufficient documentation

## 2024-05-08 DIAGNOSIS — R051 Acute cough: Secondary | ICD-10-CM

## 2024-05-08 DIAGNOSIS — J069 Acute upper respiratory infection, unspecified: Secondary | ICD-10-CM

## 2024-05-08 LAB — POC COVID19/FLU A&B COMBO
Covid Antigen, POC: NEGATIVE
Influenza A Antigen, POC: NEGATIVE
Influenza B Antigen, POC: NEGATIVE

## 2024-05-08 MED ORDER — ACETAMINOPHEN 325 MG PO TABS
650.0000 mg | ORAL_TABLET | Freq: Once | ORAL | Status: AC
  Administered 2024-05-08: 650 mg via ORAL

## 2024-05-08 MED ORDER — ACETAMINOPHEN 325 MG PO TABS
ORAL_TABLET | ORAL | Status: AC
  Filled 2024-05-08: qty 2

## 2024-05-08 MED ORDER — BENZONATATE 100 MG PO CAPS: 100.0000 mg | ORAL_CAPSULE | Freq: Three times a day (TID) | ORAL | 0 refills | Status: AC | PRN

## 2024-05-08 NOTE — ED Triage Notes (Signed)
 Patient states she woke with a cough, body aches, nsal congestion, lethargic and a headache.  Patient reports that she took OTC cold and flu medication this AM.

## 2024-05-08 NOTE — Discharge Instructions (Addendum)
Return if any problems. See your Physician for recheck if symptoms persist  

## 2024-05-08 NOTE — ED Provider Notes (Signed)
 [facility]    CSN: [id number] Arrival date & time: 05/08/24  1653      History   Chief Complaint Chief Complaint  Patient presents with   Cough   Nasal Congestion   Generalized Body Aches   Fatigue   Headache    HPI Mercedes Valdez is a 28 y.o. female.   Patient complains of a severe cough.  Patient reports symptoms started today.  Patient complains of bodyaches she is not taking any medications for cough or congestion.  Patient coughs continuously through history.  The history is provided by the patient. No language interpreter was used.  Cough Cough characteristics:  Non-productive Sputum characteristics:  Nondescript Severity:  Moderate Timing:  Constant Progression:  Worsening Context: not sick contacts   Associated symptoms: headaches   Headache Associated symptoms: cough     Past Medical History:  Diagnosis Date   ADHD (attention deficit hyperactivity disorder)    Asthma    Childhood   Diabetic retinopathy (HCC)    Elevated blood pressure reading without diagnosis of hypertension    Endometrial mass    History of 2019 novel coronavirus disease (COVID-19) 03/09/2020   positive result in epic,  per pt mild to moderate symptoms that resolved   History of asthma    child   History of gonorrhea 2016   Hypertensive retinopathy    Insulin  dependent type 2 diabetes mellitus (HCC)    followd by pcp---- (11-16-2020 per pt checks blood sugar at home 4-5 times daily,  fasting sugar--- 70--95)   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 See comment>>a molar pregnancy is not excluded; correlation with beta-HCG is  recommended.  5/21: vljwu [id number]    PCOS (polycystic ovarian syndrome)    Retinopathy of both eyes    followed by dr valdemar---  right eye  proliferative w/ macular edema;  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    Patient Active Problem List   Diagnosis Date Noted   Cough 05/08/2024   Candida vaginitis 10/10/2022   Dyslipidemia 05/09/2021   Type 2 diabetes mellitus with both eyes affected by proliferative retinopathy and macular edema, with long-term current use of insulin  (HCC) 05/09/2021   History of herpes simplex infection 10/08/2020   Psychosocial stressors 09/26/2014   Insulin  dependent type 2 diabetes mellitus (HCC) 09/06/2014   Multiple drug allergies 02/27/2014   Musculoskeletal pain 04/08/2013   Gastritis and gastroduodenitis 04/08/2013   Type 2 diabetes mellitus (HCC) 04/08/2013   Irregular menses 04/08/2013   Dysfunctional uterine bleeding 11/09/2012   PCOS (polycystic ovarian syndrome) 09/29/2012   Asthma     Past Surgical History:  Procedure Laterality Date   DILATATION & CURETTAGE/HYSTEROSCOPY WITH MYOSURE N/A 11/21/2020   Procedure: DILATATION & CURETTAGE/HYSTEROSCOPY WITH MYOSURE;  Surgeon: Izell Harari, MD;  Location: [facility];  Service: Gynecology;  Laterality: N/A;   INJECTION OF SILICONE OIL Right 02/07/2021   Procedure: INJECTION OF SILICONE OIL;  Surgeon: Valdemar Rogue, MD;  Location: [facility] OR;  Service: Ophthalmology;  Laterality: Right;   KNEE ARTHROSCOPY W/ ACL RECONSTRUCTION Left 2011   MEMBRANE PEEL Right 02/07/2021   Procedure: MEMBRANE PEEL;  Surgeon: Valdemar Rogue, MD;  Location: [facility] OR;  Service: Ophthalmology;  Laterality: Right;   PARS PLANA VITRECTOMY Right 02/07/2021   Procedure: TWENTY-FIVE  GAUGE PARS PLANA VITRECTOMY;  Surgeon: Valdemar Rogue, MD;  Location: [facility] OR;  Service: Ophthalmology;  Laterality: Right;   PERFLUORONE INJECTION Right 02/07/2021   Procedure: PERFLUORON INJECTION;  Surgeon: Valdemar Rogue, MD;  Location: [facility] OR;  Service: Ophthalmology;  Laterality: Right;   PHOTOCOAGULATION WITH LASER Right 02/07/2021   Procedure: PHOTOCOAGULATION WITH  LASER;  Surgeon: Valdemar Rogue, MD;  Location: [facility] OR;  Service: Ophthalmology;  Laterality: Right;   TYMPANOSTOMY TUBE PLACEMENT Bilateral    child    OB History     Gravida  1   Para      Term      Preterm      AB  1   Living         SAB  0   IAB      Ectopic      Multiple      Live Births           Obstetric Comments  G1: possible  molar pregnancy at 5wks based on path, SAB          Home Medications    Prior to Admission medications  Medication Sig Start Date End Date Taking? Authorizing Provider  Accu-Chek Softclix Lancets lancets To check blood sugars 4 times a day. Fasting and 2 hours after breakfast, lunch and dinner. 02/25/21   Jaycee Greig PARAS, NP  albuterol  (VENTOLIN  HFA) 108 (90 Base) MCG/ACT inhaler Inhale 2 puffs into the lungs every 6 (six) hours as needed for wheezing or shortness of breath. 06/05/23   Piontek, Rocky, MD  Blood Pressure Monitoring (BLOOD PRESSURE KIT) DEVI 1 Device by Does not apply route once a week. Please take blood pressure once a week and record in Babyscripts. 10/08/20   Lola Donnice HERO, MD  chlorhexidine  (HIBICLENS ) 4 % external liquid Apply topically daily as needed. Patient not taking: Reported on 05/08/2024 11/30/22   Dreama, [place]  N, FNP  CLINDAMYCIN  HCL PO Take by mouth. Patient not taking: Reported on 05/08/2024    [provider]  Continuous Glucose Sensor (DEXCOM G7 SENSOR) MISC 1 Device by Does not apply route as directed. Patient not taking: Reported on 05/08/2024 11/13/23   Shamleffer, Ibtehal Jaralla, MD  dicyclomine  (BENTYL ) 20 MG tablet Take 1 tablet (20 mg total) by mouth 2 (two) times daily. 03/03/24   Keith, Kayla N, PA-C  empagliflozin  (JARDIANCE ) 25 MG TABS tablet Take 1 tablet (25 mg total) by mouth daily before breakfast. 11/13/23   Shamleffer, Ibtehal Jaralla, MD  glucose blood (ACCU-CHEK GUIDE) test strip To check blood sugars 4 times a day. Fasting, and 2 hours after Breakfast, Lunch and Dinner  02/25/21   Jaycee Greig PARAS, NP  ibuprofen  (ADVIL ) 800 MG tablet Take 800 mg by mouth 3 (three) times daily. Patient not taking: Reported on 05/08/2024 04/22/21   [provider]  insulin  aspart (NOVOLOG ) 100 UNIT/ML injection Max daily 40 units per pump Patient not taking: Reported on 05/08/2024 07/16/23   Shamleffer, Donell Cardinal, MD  metoCLOPramide  (REGLAN ) 10 MG tablet Take 1 tablet (10 mg total) by mouth every 6 (six) hours. 03/08/24   Mannie Fairy DASEN, DO  Misc. Devices (GOJJI WEIGHT SCALE) MISC 1 Device by Does not apply route once a week. Please take weight and record in Babyscripts once a week. 10/08/20   Lola Donnice HERO, MD  omeprazole  (PRILOSEC) 20 MG capsule Take 1 capsule (20 mg total) by mouth daily. Patient not taking: Reported on 05/08/2024 03/08/24   Mannie Fairy T, DO  ondansetron  (ZOFRAN -ODT) 4  MG disintegrating tablet Take 1 tablet (4 mg total) by mouth every 8 (eight) hours as needed. 03/11/24   Jarold Olam HERO, PA-C  promethazine  (PHENERGAN ) 25 MG suppository Place 1 suppository (25 mg total) rectally every 6 (six) hours as needed for nausea or vomiting. 03/12/24   Henderly, Britni A, PA-C  Semaglutide , 1 MG/DOSE, 4 MG/3ML SOPN Inject 1 mg as directed once a week. Patient not taking: Reported on 05/08/2024 11/13/23   Shamleffer, Ibtehal Jaralla, MD  triamcinolone  cream (KENALOG ) 0.1 % Twice daily to area of itch for 1-2 weeks Patient not taking: Reported on 05/08/2024 10/29/23   Rising, Asberry RIGGERS    Family History Family History  Problem Relation Age of Onset   Diabetes Mother    Vision loss Mother    ADD / ADHD Brother    Allergies Brother    Asthma Brother    Vision loss Brother    Cancer Paternal Grandfather        Colon Cancer   Heart disease Father 61       died from MI at age 40    Social History Social History[1]   Allergies   Amoxicillin, Augmentin [amoxicillin-pot clavulanate], Penicillins, Shellfish allergy, Suprax  [cefixime ],  Trulicity  [dulaglutide ], and Latex   Review of Systems Review of Systems  Respiratory:  Positive for cough.   Neurological:  Positive for headaches.  All other systems reviewed and are negative.    Physical Exam Triage Vital Signs ED Triage Vitals  Encounter Vitals Group     BP 05/08/24 1748 105/66     Girls Systolic BP Percentile --      Girls Diastolic BP Percentile --      Boys Systolic BP Percentile --      Boys Diastolic BP Percentile --      Pulse Rate 05/08/24 1748 90     Resp 05/08/24 1748 16     Temp 05/08/24 1748 98.2 F (36.8 C)     Temp Source 05/08/24 1748 Oral     SpO2 05/08/24 1748 97 %     Weight --      Height --      Head Circumference --      Peak Flow --      Pain Score 05/08/24 1747 7     Pain Loc --      Pain Education --      Exclude from Growth Chart --    No data found.  Updated Vital Signs BP 105/66 (BP Location: Left Arm)   Pulse 90   Temp 98.2 F (36.8 C) (Oral)   Resp 16   LMP 04/15/2024 (Exact Date)   SpO2 97%   Visual Acuity Right Eye Distance:   Left Eye Distance:   Bilateral Distance:    Right Eye Near:   Left Eye Near:    Bilateral Near:     Physical Exam Vitals and nursing note reviewed.  Constitutional:      Appearance: She is well-developed.  HENT:     Head: Normocephalic.  Cardiovascular:     Rate and Rhythm: Normal rate.  Pulmonary:     Effort: Pulmonary effort is normal.     Comments: Coughing Abdominal:     General: There is no distension.  Musculoskeletal:        General: Normal range of motion.     Cervical back: Normal range of motion.  Skin:    General: Skin is warm.  Neurological:     General: No focal  deficit present.     Mental Status: She is alert and oriented to person, place, and time.  Psychiatric:        Mood and Affect: Mood normal.      UC Treatments / Results  Labs (all labs ordered are listed, but only abnormal results are displayed) Labs Reviewed  POC COVID19/FLU A&B COMBO     EKG   Radiology No results found.  Procedures Procedures (including critical care time)  Medications Ordered in UC Medications  acetaminophen  (TYLENOL ) tablet 650 mg (650 mg Oral Given 05/08/24 1847)    Initial Impression / Assessment and Plan / UC Course  I have reviewed the triage vital signs and the nursing notes.  Pertinent labs & imaging results that were available during my care of the patient were reviewed by me and considered in my medical decision making (see chart for details).      Final Clinical Impressions(s) / UC Diagnoses   Final diagnoses:  Acute cough  Upper respiratory tract infection, unspecified type   Discharge Instructions   None    ED Prescriptions   None    PDMP not reviewed this encounter. An After Visit Summary was printed and given to the patient.        [1]  Social History Tobacco Use   Smoking status: Some Days    Current packs/day: 0.25    Average packs/day: 0.3 packs/day for 6.0 years (1.5 ttl pk-yrs)    Types: Cigarettes    Passive exposure: Current   Smokeless tobacco: Never   Tobacco comments:    11-16-2020  per pt 3 cig per day  Vaping Use   Vaping status: Never Used  Substance Use Topics   Alcohol use: Yes    Comment: socially   Drug use: Yes    Types: Marijuana     Flint Sonny POUR, PA-C 05/08/24 1943

## 2024-06-11 ENCOUNTER — Ambulatory Visit (HOSPITAL_COMMUNITY)
Admission: EM | Admit: 2024-06-11 | Discharge: 2024-06-11 | Disposition: A | Discharge status: Home / Self Care | Payer: Self-pay | Attending: Emergency Medicine | Admitting: Emergency Medicine

## 2024-06-11 ENCOUNTER — Encounter (HOSPITAL_COMMUNITY): Payer: Self-pay | Admitting: *Deleted

## 2024-06-11 DIAGNOSIS — N3000 Acute cystitis without hematuria: Secondary | ICD-10-CM | POA: Insufficient documentation

## 2024-06-11 DIAGNOSIS — R300 Dysuria: Secondary | ICD-10-CM | POA: Insufficient documentation

## 2024-06-11 DIAGNOSIS — E1165 Type 2 diabetes mellitus with hyperglycemia: Secondary | ICD-10-CM | POA: Insufficient documentation

## 2024-06-11 DIAGNOSIS — Z794 Long term (current) use of insulin: Secondary | ICD-10-CM

## 2024-06-11 LAB — POCT URINE PREGNANCY: Preg Test, Ur: NEGATIVE

## 2024-06-11 LAB — POCT URINE DIPSTICK
Bilirubin, UA: NEGATIVE
Glucose, UA: NEGATIVE mg/dL
Ketones, POC UA: NEGATIVE mg/dL
Nitrite, UA: NEGATIVE
Protein Ur, POC: 30 mg/dL — AB
Spec Grav, UA: 1.025
Urobilinogen, UA: 0.2 U/dL
pH, UA: 6.5

## 2024-06-11 LAB — GLUCOSE, POCT (MANUAL RESULT ENTRY): POCT Glucose (KUC): 202 mg/dL — AB (ref 70–99)

## 2024-06-11 MED ORDER — SULFAMETHOXAZOLE-TRIMETHOPRIM 800-160 MG PO TABS: 1.0000 | ORAL_TABLET | Freq: Two times a day (BID) | ORAL | 0 refills | Status: AC

## 2024-06-11 NOTE — Discharge Instructions (Addendum)
 Take antibiotic twice daily with food  Drink at least 64 ounces of water  daily to flush kidneys  Tylenol  or ibuprofen  as needed for pain   Return to clinic for new or urgent symptoms

## 2024-06-11 NOTE — ED Provider Notes (Signed)
 " [facility]    CSN: [id number] Arrival date & time: 06/11/24  0803      History   Chief Complaint No chief complaint on file.   HPI Mercedes Valdez is a 29 y.o. female.   Patient presents to clinic over concern of urinary frequency with discomfort at the end of urination that is been ongoing for the past week.  She has tried Azo a few days ago but this did not help so she stopped taking this.  Denies N/V or fevers, denies hematuria Does have LBP, thinks this is from work and not associated with urinary symptoms Tried AZO, has not helped, took last on Thursday  Denies vaginal issues such as discharge or odor and she has not had abdominal pain   Hx of DM2, A1C 3 month ago was 7.6, patient had been on insulin  but lost insurance last year so she has been without.  3 months ago Cr was 0.79, GFR was >60.  Does still have a few bottles of Jardiance  at home   The history is provided by the patient and medical records.    Past Medical History:  Diagnosis Date   ADHD (attention deficit hyperactivity disorder)    Asthma    Childhood   Diabetic retinopathy (HCC)    Elevated blood pressure reading without diagnosis of hypertension    Endometrial mass    History of 2019 novel coronavirus disease (COVID-19) 03/09/2020   positive result in epic,  per pt mild to moderate symptoms that resolved   History of asthma    child   History of gonorrhea 2016   Hypertensive retinopathy    Insulin  dependent type 2 diabetes mellitus (HCC)    followd by pcp---- (11-16-2020 per pt checks blood sugar at home 4-5 times daily,  fasting sugar--- 70--95)   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 See comment>>a molar pregnancy is not excluded; correlation with beta-HCG is  recommended.  5/21: vljwu  88,773    PCOS (polycystic ovarian syndrome)    Retinopathy of both eyes    followed by dr valdemar---  right eye proliferative w/ macular edema;  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    Patient Active Problem List   Diagnosis Date Noted   Cough 05/08/2024   Candida vaginitis 10/10/2022   Dyslipidemia 05/09/2021   Type 2 diabetes mellitus with both eyes affected by proliferative retinopathy and macular edema, with long-term current use of insulin  (HCC) 05/09/2021   History of herpes simplex infection 10/08/2020   Psychosocial stressors 09/26/2014   Insulin  dependent type 2 diabetes mellitus (HCC) 09/06/2014   Multiple drug allergies 02/27/2014   Musculoskeletal pain 04/08/2013   Gastritis and gastroduodenitis 04/08/2013   Type 2 diabetes mellitus (HCC) 04/08/2013   Irregular menses 04/08/2013   Dysfunctional uterine bleeding 11/09/2012   PCOS (polycystic ovarian syndrome) 09/29/2012   Asthma     Past Surgical History:  Procedure Laterality Date   DILATATION & CURETTAGE/HYSTEROSCOPY WITH MYOSURE N/A 11/21/2020   Procedure: DILATATION & CURETTAGE/HYSTEROSCOPY WITH MYOSURE;  Surgeon: Izell Harari, MD;  Location: [facility];  Service: Gynecology;  Laterality: N/A;   INJECTION OF SILICONE OIL Right 02/07/2021   Procedure: INJECTION OF SILICONE OIL;  Surgeon: Valdemar Rogue, MD;  Location: [facility] OR;  Service: Ophthalmology;  Laterality: Right;   KNEE  ARTHROSCOPY W/ ACL RECONSTRUCTION Left 2011   MEMBRANE PEEL Right 02/07/2021   Procedure: MEMBRANE PEEL;  Surgeon: Valdemar Rogue, MD;  Location: [facility] OR;  Service: Ophthalmology;  Laterality: Right;   PARS PLANA VITRECTOMY Right 02/07/2021   Procedure: TWENTY-FIVE  GAUGE PARS PLANA VITRECTOMY;  Surgeon: Valdemar Rogue, MD;  Location: [facility] OR;  Service: Ophthalmology;  Laterality: Right;   PERFLUORONE INJECTION Right 02/07/2021   Procedure: PERFLUORON INJECTION;  Surgeon: Valdemar Rogue, MD;  Location: [facility] OR;  Service:  Ophthalmology;  Laterality: Right;   PHOTOCOAGULATION WITH LASER Right 02/07/2021   Procedure: PHOTOCOAGULATION WITH LASER;  Surgeon: Valdemar Rogue, MD;  Location: [facility] OR;  Service: Ophthalmology;  Laterality: Right;   TYMPANOSTOMY TUBE PLACEMENT Bilateral    child    OB History     Gravida  1   Para      Term      Preterm      AB  1   Living         SAB  0   IAB      Ectopic      Multiple      Live Births           Obstetric Comments  G1: possible  molar pregnancy at 5wks based on path, SAB          Home Medications    Prior to Admission medications  Medication Sig Start Date End Date Taking? Authorizing Provider  sulfamethoxazole -trimethoprim  (BACTRIM  DS) 800-160 MG tablet Take 1 tablet by mouth 2 (two) times daily for 3 days. 06/11/24 06/14/24 Yes Ball, Aliana Kreischer  G, FNP  Accu-Chek Softclix Lancets lancets To check blood sugars 4 times a day. Fasting and 2 hours after breakfast, lunch and dinner. 02/25/21   Jaycee Greig PARAS, NP  albuterol  (VENTOLIN  HFA) 108 (90 Base) MCG/ACT inhaler Inhale 2 puffs into the lungs every 6 (six) hours as needed for wheezing or shortness of breath. 06/05/23   Piontek, Rocky, MD  benzonatate  (TESSALON ) 100 MG capsule Take 1 capsule (100 mg total) by mouth 3 (three) times daily as needed for cough. 05/08/24   Flint Sonny POUR, PA-C  Blood Pressure Monitoring (BLOOD PRESSURE KIT) DEVI 1 Device by Does not apply route once a week. Please take blood pressure once a week and record in Babyscripts. 10/08/20   Lola Donnice HERO, MD  chlorhexidine  (HIBICLENS ) 4 % external liquid Apply topically daily as needed. Patient not taking: Reported on 05/08/2024 11/30/22   Ball, Keegen Heffern  G, FNP  CLINDAMYCIN  HCL PO Take by mouth. Patient not taking: Reported on 05/08/2024    [provider]  Continuous Glucose Sensor (DEXCOM G7 SENSOR) MISC 1 Device by Does not apply route as directed. Patient not taking: Reported on 05/08/2024 11/13/23   Shamleffer,  Ibtehal Jaralla, MD  dicyclomine  (BENTYL ) 20 MG tablet Take 1 tablet (20 mg total) by mouth 2 (two) times daily. 03/03/24   Keith, Kayla N, PA-C  empagliflozin  (JARDIANCE ) 25 MG TABS tablet Take 1 tablet (25 mg total) by mouth daily before breakfast. 11/13/23   Shamleffer, Ibtehal Jaralla, MD  glucose blood (ACCU-CHEK GUIDE) test strip To check blood sugars 4 times a day. Fasting, and 2 hours after Breakfast, Lunch and Dinner 02/25/21   Jaycee Greig PARAS, NP  ibuprofen  (ADVIL ) 800 MG tablet Take 800 mg by mouth 3 (three) times daily. Patient not taking: Reported on 05/08/2024 04/22/21   [provider]  insulin  aspart (NOVOLOG ) 100 UNIT/ML injection Max daily 40 units per pump  Patient not taking: Reported on 05/08/2024 07/16/23   Shamleffer, Ibtehal Jaralla, MD  metoCLOPramide  (REGLAN ) 10 MG tablet Take 1 tablet (10 mg total) by mouth every 6 (six) hours. 03/08/24   Mannie Fairy DASEN, DO  Misc. Devices (GOJJI WEIGHT SCALE) MISC 1 Device by Does not apply route once a week. Please take weight and record in Babyscripts once a week. 10/08/20   Lola Donnice HERO, MD  omeprazole  (PRILOSEC) 20 MG capsule Take 1 capsule (20 mg total) by mouth daily. Patient not taking: Reported on 05/08/2024 03/08/24   Mannie Fairy T, DO  ondansetron  (ZOFRAN -ODT) 4 MG disintegrating tablet Take 1 tablet (4 mg total) by mouth every 8 (eight) hours as needed. 03/11/24   Jarold Olam HERO, PA-C  promethazine  (PHENERGAN ) 25 MG suppository Place 1 suppository (25 mg total) rectally every 6 (six) hours as needed for nausea or vomiting. 03/12/24   Henderly, Britni A, PA-C  Semaglutide , 1 MG/DOSE, 4 MG/3ML SOPN Inject 1 mg as directed once a week. Patient not taking: Reported on 05/08/2024 11/13/23   Shamleffer, Ibtehal Jaralla, MD  triamcinolone  cream (KENALOG ) 0.1 % Twice daily to area of itch for 1-2 weeks Patient not taking: Reported on 05/08/2024 10/29/23   Rising, Asberry RIGGERS    Family History Family History  Problem  Relation Age of Onset   Diabetes Mother    Vision loss Mother    ADD / ADHD Brother    Allergies Brother    Asthma Brother    Vision loss Brother    Cancer Paternal Grandfather        Colon Cancer   Heart disease Father 14       died from MI at age 22    Social History Social History[1]   Allergies   Amoxicillin, Augmentin [amoxicillin-pot clavulanate], Penicillins, Shellfish allergy, Suprax  [cefixime ], Trulicity  [dulaglutide ], and Latex   Review of Systems Review of Systems  Per HPI  Physical Exam Triage Vital Signs ED Triage Vitals  Encounter Vitals Group     BP 06/11/24 0839 134/87     Girls Systolic BP Percentile --      Girls Diastolic BP Percentile --      Boys Systolic BP Percentile --      Boys Diastolic BP Percentile --      Pulse Rate 06/11/24 0839 92     Resp 06/11/24 0839 16     Temp 06/11/24 0839 98 F (36.7 C)     Temp Source 06/11/24 0839 Oral     SpO2 06/11/24 0839 99 %     Weight --      Height --      Head Circumference --      Peak Flow --      Pain Score 06/11/24 0837 8     Pain Loc --      Pain Education --      Exclude from Growth Chart --    No data found.  Updated Vital Signs BP 134/87 (BP Location: Left Arm)   Pulse 92   Temp 98 F (36.7 C) (Oral)   Resp 16   LMP 05/14/2024 (Exact Date)   SpO2 99%   Visual Acuity Right Eye Distance:   Left Eye Distance:   Bilateral Distance:    Right Eye Near:   Left Eye Near:    Bilateral Near:     Physical Exam Vitals and nursing note reviewed.  Constitutional:      Appearance: Normal appearance.  HENT:  Head: Normocephalic and atraumatic.     Right Ear: External ear normal.     Left Ear: External ear normal.     Nose: Nose normal.     Mouth/Throat:     Mouth: Mucous membranes are moist.  Eyes:     Conjunctiva/sclera: Conjunctivae normal.  Cardiovascular:     Rate and Rhythm: Normal rate.  Pulmonary:     Effort: Pulmonary effort is normal. No respiratory distress.   Abdominal:     Tenderness: There is no right CVA tenderness or left CVA tenderness.  Neurological:     General: No focal deficit present.     Mental Status: She is alert.  Psychiatric:        Mood and Affect: Mood normal.      UC Treatments / Results  Labs (all labs ordered are listed, but only abnormal results are displayed) Labs Reviewed  POCT URINE DIPSTICK - Abnormal; Notable for the following components:      Result Value   Clarity, UA cloudy (*)    Blood, UA trace-lysed (*)    Protein Ur, POC =30 (*)    Leukocytes, UA Large (3+) (*)    All other components within normal limits  GLUCOSE, POCT (MANUAL RESULT ENTRY) - Abnormal; Notable for the following components:   POCT Glucose ([facility]) 202 (*)    All other components within normal limits  URINE CULTURE  POCT URINE PREGNANCY    EKG   Radiology No results found.  Procedures Procedures (including critical care time)  Medications Ordered in UC Medications - No data to display  Initial Impression / Assessment and Plan / UC Course  I have reviewed the triage vital signs and the nursing notes.  Pertinent labs & imaging results that were available during my care of the patient were reviewed by me and considered in my medical decision making (see chart for details).  Vitals and triage reviewed, patient is hemodynamically stable.  Afebrile without tachycardia.  Negative for CVA tenderness.  Without clinical evidence of pyelonephritis.  Urine pregnancy negative.  CBG in clinic 202, has had chips today.  Type II diabetic without insulin  due to insurance issues.  UA shows trace red blood cells and large leuks, will send for culture.  Will treat for acute cystitis with Bactrim  twice daily x 3 days.  Plan of care, follow-up care, and return precautions given, no questions at this time.    Final Clinical Impressions(s) / UC Diagnoses   Final diagnoses:  Dysuria  Type 2 diabetes mellitus with hyperglycemia,  unspecified whether long term insulin  use (HCC)  Acute cystitis without hematuria     Discharge Instructions      Take antibiotic twice daily with food  Drink at least 64 ounces of water  daily to flush kidneys  Tylenol  or ibuprofen  as needed for pain   Return to clinic for new or urgent symptoms      ED Prescriptions     Medication Sig Dispense Auth. Provider   sulfamethoxazole -trimethoprim  (BACTRIM  DS) 800-160 MG tablet Take 1 tablet by mouth 2 (two) times daily for 3 days. 6 tablet Ball, Iliany Losier  G, FNP      PDMP not reviewed this encounter.     [1]  Social History Tobacco Use   Smoking status: Some Days    Current packs/day: 0.25    Average packs/day: 0.3 packs/day for 6.0 years (1.5 ttl pk-yrs)    Types: Cigarettes    Passive exposure: Current   Smokeless tobacco: Never  Tobacco comments:    11-16-2020  per pt 3 cig per day  Vaping Use   Vaping status: Never Used  Substance Use Topics   Alcohol use: Yes    Comment: socially   Drug use: Yes    Types: Marijuana     Mercer Rosana MATSU, FNP 06/11/24 [phone]  "

## 2024-06-11 NOTE — ED Triage Notes (Addendum)
 Pt states she has dysuria and increased urine frequency X 1 week. She has been taking AZO   Pt states she lost insurance in July and she has no had any insulin  since August.

## 2024-06-14 ENCOUNTER — Ambulatory Visit (HOSPITAL_COMMUNITY): Payer: Self-pay | Admitting: Emergency Medicine

## 2024-06-14 LAB — URINE CULTURE: Culture: >=100000 — AB

## 2024-06-14 MED ORDER — NITROFURANTOIN MONOHYD MACRO 100 MG PO CAPS: 100.0000 mg | ORAL_CAPSULE | Freq: Two times a day (BID) | ORAL | 0 refills | Status: AC
